# Patient Record
Sex: Female | Born: 1940 | Race: White | Hispanic: No | Marital: Married | Smoking: Never smoker
Health system: Southern US, Community
[De-identification: ages and names within clinical notes are randomized; demographics above are authoritative.]

## PROBLEM LIST (undated history)

## (undated) DIAGNOSIS — G43909 Migraine, unspecified, not intractable, without status migrainosus: Secondary | ICD-10-CM

## (undated) DIAGNOSIS — D649 Anemia, unspecified: Secondary | ICD-10-CM

## (undated) DIAGNOSIS — F32A Depression, unspecified: Secondary | ICD-10-CM

## (undated) DIAGNOSIS — M1990 Unspecified osteoarthritis, unspecified site: Secondary | ICD-10-CM

## (undated) DIAGNOSIS — G8929 Other chronic pain: Secondary | ICD-10-CM

## (undated) DIAGNOSIS — I779 Disorder of arteries and arterioles, unspecified: Secondary | ICD-10-CM

## (undated) DIAGNOSIS — K639 Disease of intestine, unspecified: Secondary | ICD-10-CM

## (undated) DIAGNOSIS — Z973 Presence of spectacles and contact lenses: Secondary | ICD-10-CM

## (undated) DIAGNOSIS — F1320 Sedative, hypnotic or anxiolytic dependence, uncomplicated: Secondary | ICD-10-CM

## (undated) DIAGNOSIS — I5030 Unspecified diastolic (congestive) heart failure: Secondary | ICD-10-CM

## (undated) DIAGNOSIS — M810 Age-related osteoporosis without current pathological fracture: Secondary | ICD-10-CM

## (undated) DIAGNOSIS — F329 Major depressive disorder, single episode, unspecified: Secondary | ICD-10-CM

## (undated) DIAGNOSIS — R42 Dizziness and giddiness: Secondary | ICD-10-CM

## (undated) DIAGNOSIS — Z974 Presence of external hearing-aid: Secondary | ICD-10-CM

## (undated) DIAGNOSIS — H00019 Hordeolum externum unspecified eye, unspecified eyelid: Secondary | ICD-10-CM

## (undated) DIAGNOSIS — H3530 Unspecified macular degeneration: Secondary | ICD-10-CM

## (undated) DIAGNOSIS — K219 Gastro-esophageal reflux disease without esophagitis: Secondary | ICD-10-CM

## (undated) DIAGNOSIS — I739 Peripheral vascular disease, unspecified: Secondary | ICD-10-CM

## (undated) DIAGNOSIS — F419 Anxiety disorder, unspecified: Secondary | ICD-10-CM

## (undated) DIAGNOSIS — K589 Irritable bowel syndrome without diarrhea: Secondary | ICD-10-CM

## (undated) DIAGNOSIS — K122 Cellulitis and abscess of mouth: Secondary | ICD-10-CM

## (undated) DIAGNOSIS — N183 Chronic kidney disease, stage 3 (moderate): Secondary | ICD-10-CM

## (undated) HISTORY — DX: Dizziness and giddiness: R42

## (undated) HISTORY — PX: OVARIAN CYST SURGERY: SHX726

## (undated) HISTORY — DX: Disorder of arteries and arterioles, unspecified: I77.9

## (undated) HISTORY — DX: Unspecified diastolic (congestive) heart failure: I50.30

## (undated) HISTORY — PX: OTHER SURGICAL HISTORY: SHX169

## (undated) HISTORY — DX: Age-related osteoporosis without current pathological fracture: M81.0

## (undated) HISTORY — PX: ABDOMINAL ADHESION SURGERY: SHX90

## (undated) HISTORY — PX: ESOPHAGOGASTRODUODENOSCOPY: SHX5428

## (undated) HISTORY — PX: ABDOMINAL HYSTERECTOMY: SHX81

## (undated) HISTORY — PX: APPENDECTOMY: SHX54

## (undated) HISTORY — DX: Peripheral vascular disease, unspecified: I73.9

## (undated) HISTORY — DX: Unspecified macular degeneration: H35.30

## (undated) HISTORY — PX: SMALL INTESTINE SURGERY: SHX150

## (undated) HISTORY — DX: Other chronic pain: G89.29

## (undated) HISTORY — PX: COLON SURGERY: SHX602

## (undated) HISTORY — PX: FLEXIBLE SIGMOIDOSCOPY: SHX1649

## (undated) HISTORY — DX: Chronic kidney disease, stage 3 (moderate): N18.3

## (undated) HISTORY — PX: SUBTOTAL COLECTOMY: SHX855

## (undated) HISTORY — PX: CHOLECYSTECTOMY: SHX55

---

## 1970-11-29 HISTORY — PX: ABDOMINAL HYSTERECTOMY: SHX81

## 1987-11-30 HISTORY — PX: OTHER SURGICAL HISTORY: SHX169

## 1994-11-29 HISTORY — PX: CHOLECYSTECTOMY: SHX55

## 1998-05-19 ENCOUNTER — Inpatient Hospital Stay (HOSPITAL_COMMUNITY): Admission: AD | Admit: 1998-05-19 | Discharge: 1998-05-22 | Payer: Self-pay | Admitting: Psychiatry

## 1999-04-07 ENCOUNTER — Encounter: Payer: Self-pay | Admitting: Internal Medicine

## 1999-04-07 ENCOUNTER — Ambulatory Visit (HOSPITAL_COMMUNITY): Admission: RE | Admit: 1999-04-07 | Discharge: 1999-04-07 | Payer: Self-pay | Admitting: Internal Medicine

## 1999-12-10 ENCOUNTER — Encounter: Payer: Self-pay | Admitting: Gastroenterology

## 1999-12-10 ENCOUNTER — Ambulatory Visit (HOSPITAL_COMMUNITY): Admission: RE | Admit: 1999-12-10 | Discharge: 1999-12-10 | Payer: Self-pay | Admitting: Gastroenterology

## 2001-08-18 ENCOUNTER — Encounter: Payer: Self-pay | Admitting: Internal Medicine

## 2001-08-18 ENCOUNTER — Encounter: Admission: RE | Admit: 2001-08-18 | Discharge: 2001-08-18 | Payer: Self-pay | Admitting: Internal Medicine

## 2001-08-31 ENCOUNTER — Ambulatory Visit (HOSPITAL_BASED_OUTPATIENT_CLINIC_OR_DEPARTMENT_OTHER): Admission: RE | Admit: 2001-08-31 | Discharge: 2001-08-31 | Payer: Self-pay | Admitting: Otolaryngology

## 2003-07-31 ENCOUNTER — Encounter: Admission: RE | Admit: 2003-07-31 | Discharge: 2003-07-31 | Payer: Self-pay | Admitting: Family Medicine

## 2003-07-31 ENCOUNTER — Encounter: Payer: Self-pay | Admitting: Family Medicine

## 2003-11-27 ENCOUNTER — Encounter: Admission: RE | Admit: 2003-11-27 | Discharge: 2003-11-27 | Payer: Self-pay | Admitting: Family Medicine

## 2004-03-15 ENCOUNTER — Emergency Department (HOSPITAL_COMMUNITY): Admission: EM | Admit: 2004-03-15 | Discharge: 2004-03-15 | Payer: Self-pay | Admitting: Emergency Medicine

## 2004-05-19 ENCOUNTER — Encounter: Admission: RE | Admit: 2004-05-19 | Discharge: 2004-05-19 | Payer: Self-pay | Admitting: Family Medicine

## 2004-06-09 ENCOUNTER — Ambulatory Visit (HOSPITAL_COMMUNITY): Admission: RE | Admit: 2004-06-09 | Discharge: 2004-06-09 | Payer: Self-pay | Admitting: Ophthalmology

## 2004-08-06 ENCOUNTER — Encounter: Admission: RE | Admit: 2004-08-06 | Discharge: 2004-08-06 | Payer: Self-pay | Admitting: Family Medicine

## 2004-08-18 ENCOUNTER — Ambulatory Visit (HOSPITAL_COMMUNITY): Admission: RE | Admit: 2004-08-18 | Discharge: 2004-08-18 | Payer: Self-pay | Admitting: Ophthalmology

## 2004-10-19 ENCOUNTER — Encounter: Admission: RE | Admit: 2004-10-19 | Discharge: 2005-01-17 | Payer: Self-pay | Admitting: Family Medicine

## 2004-11-19 ENCOUNTER — Emergency Department (HOSPITAL_COMMUNITY): Admission: EM | Admit: 2004-11-19 | Discharge: 2004-11-20 | Payer: Self-pay | Admitting: Emergency Medicine

## 2005-02-16 ENCOUNTER — Ambulatory Visit: Payer: Self-pay | Admitting: Family Medicine

## 2005-09-20 ENCOUNTER — Encounter: Admission: RE | Admit: 2005-09-20 | Discharge: 2005-09-20 | Payer: Self-pay | Admitting: Family Medicine

## 2007-10-17 ENCOUNTER — Encounter: Admission: RE | Admit: 2007-10-17 | Discharge: 2007-10-17 | Payer: Self-pay | Admitting: Family Medicine

## 2008-10-16 ENCOUNTER — Other Ambulatory Visit: Admission: RE | Admit: 2008-10-16 | Discharge: 2008-10-16 | Payer: Self-pay | Admitting: Family Medicine

## 2008-11-01 ENCOUNTER — Encounter: Admission: RE | Admit: 2008-11-01 | Discharge: 2008-11-01 | Payer: Self-pay | Admitting: Family Medicine

## 2008-11-04 ENCOUNTER — Encounter: Admission: RE | Admit: 2008-11-04 | Discharge: 2008-11-04 | Payer: Self-pay | Admitting: Family Medicine

## 2010-09-10 LAB — HM SIGMOIDOSCOPY

## 2010-12-20 ENCOUNTER — Encounter: Payer: Self-pay | Admitting: *Deleted

## 2010-12-21 ENCOUNTER — Encounter: Payer: Self-pay | Admitting: Obstetrics and Gynecology

## 2010-12-29 ENCOUNTER — Inpatient Hospital Stay (HOSPITAL_COMMUNITY)
Admission: EM | Admit: 2010-12-29 | Discharge: 2010-12-31 | DRG: 143 | Disposition: A | Discharge status: Home / Self Care | Payer: BC Managed Care – HMO | Attending: Internal Medicine | Admitting: Internal Medicine

## 2010-12-29 DIAGNOSIS — R112 Nausea with vomiting, unspecified: Secondary | ICD-10-CM | POA: Diagnosis not present

## 2010-12-29 DIAGNOSIS — F341 Dysthymic disorder: Secondary | ICD-10-CM | POA: Diagnosis present

## 2010-12-29 DIAGNOSIS — R0789 Other chest pain: Principal | ICD-10-CM | POA: Diagnosis present

## 2010-12-29 DIAGNOSIS — G43909 Migraine, unspecified, not intractable, without status migrainosus: Secondary | ICD-10-CM | POA: Diagnosis present

## 2010-12-29 DIAGNOSIS — Z882 Allergy status to sulfonamides status: Secondary | ICD-10-CM

## 2010-12-29 LAB — DIFFERENTIAL
Basophils Absolute: 0 10*3/uL (ref 0.0–0.1)
Basophils Relative: 0 % (ref 0–1)
Eosinophils Absolute: 0.1 10*3/uL (ref 0.0–0.7)
Eosinophils Relative: 2 % (ref 0–5)
Lymphocytes Relative: 35 % (ref 12–46)
Lymphs Abs: 2.4 10*3/uL (ref 0.7–4.0)
Monocytes Absolute: 0.5 10*3/uL (ref 0.1–1.0)
Monocytes Relative: 8 % (ref 3–12)
Neutro Abs: 3.7 10*3/uL (ref 1.7–7.7)
Neutrophils Relative %: 55 % (ref 43–77)

## 2010-12-29 LAB — URINALYSIS, ROUTINE W REFLEX MICROSCOPIC
Bilirubin Urine: NEGATIVE
Hgb urine dipstick: NEGATIVE
Ketones, ur: NEGATIVE mg/dL
Nitrite: NEGATIVE
Protein, ur: NEGATIVE mg/dL
Specific Gravity, Urine: 1.011 (ref 1.005–1.030)
Urine Glucose, Fasting: NEGATIVE mg/dL
Urobilinogen, UA: 0.2 mg/dL (ref 0.0–1.0)
pH: 7.5 (ref 5.0–8.0)

## 2010-12-29 LAB — TROPONIN I: Troponin I: 0.02 ng/mL (ref 0.00–0.06)

## 2010-12-29 LAB — COMPREHENSIVE METABOLIC PANEL
ALT: 22 U/L (ref 0–35)
AST: 24 U/L (ref 0–37)
Albumin: 3.6 g/dL (ref 3.5–5.2)
Alkaline Phosphatase: 66 U/L (ref 39–117)
BUN: 10 mg/dL (ref 6–23)
CO2: 24 mEq/L (ref 19–32)
Calcium: 9.3 mg/dL (ref 8.4–10.5)
Chloride: 110 mEq/L (ref 96–112)
Creatinine, Ser: 1.14 mg/dL (ref 0.4–1.2)
GFR calc Af Amer: 58 mL/min — ABNORMAL LOW (ref >60)
GFR calc non Af Amer: 48 mL/min — ABNORMAL LOW (ref >60)
Glucose, Bld: 95 mg/dL (ref 70–99)
Potassium: 3.8 mEq/L (ref 3.5–5.1)
Sodium: 142 mEq/L (ref 135–145)
Total Bilirubin: 0.3 mg/dL (ref 0.3–1.2)
Total Protein: 6.5 g/dL (ref 6.0–8.3)

## 2010-12-29 LAB — CBC
HCT: 39.4 % (ref 36.0–46.0)
Hemoglobin: 13.5 g/dL (ref 12.0–15.0)
MCH: 31.8 pg (ref 26.0–34.0)
MCHC: 34.3 g/dL (ref 30.0–36.0)
MCV: 92.9 fL (ref 78.0–100.0)
Platelets: 226 10*3/uL (ref 150–400)
RBC: 4.24 MIL/uL (ref 3.87–5.11)
RDW: 12.5 % (ref 11.5–15.5)
WBC: 6.8 10*3/uL (ref 4.0–10.5)

## 2010-12-29 LAB — MRSA PCR SCREENING: MRSA by PCR: NEGATIVE

## 2010-12-29 LAB — APTT: aPTT: 27 seconds (ref 24–37)

## 2010-12-29 LAB — URINE MICROSCOPIC-ADD ON

## 2010-12-29 LAB — POCT CARDIAC MARKERS
CKMB, poc: <1 ng/mL — ABNORMAL LOW (ref 1.0–8.0)
CKMB, poc: <1 ng/mL — ABNORMAL LOW (ref 1.0–8.0)
Myoglobin, poc: 64.6 ng/mL (ref 12–200)
Myoglobin, poc: 67 ng/mL (ref 12–200)
Troponin i, poc: <0.05 ng/mL (ref 0.00–0.09)
Troponin i, poc: <0.05 ng/mL (ref 0.00–0.09)

## 2010-12-29 LAB — CK TOTAL AND CKMB (NOT AT ARMC)
CK, MB: 1.2 ng/mL (ref 0.3–4.0)
Relative Index: INVALID (ref 0.0–2.5)
Total CK: 85 U/L (ref 7–177)

## 2010-12-29 LAB — BRAIN NATRIURETIC PEPTIDE: Pro B Natriuretic peptide (BNP): <30 pg/mL (ref 0.0–100.0)

## 2010-12-29 LAB — D-DIMER, QUANTITATIVE (NOT AT ARMC): D-Dimer, Quant: 0.22 ug/mL-FEU (ref 0.00–0.48)

## 2010-12-29 LAB — PROTIME-INR
INR: 0.92 (ref 0.00–1.49)
Prothrombin Time: 12.6 seconds (ref 11.6–15.2)

## 2010-12-29 LAB — TSH: TSH: 3.13 u[IU]/mL (ref 0.350–4.500)

## 2010-12-30 ENCOUNTER — Inpatient Hospital Stay (HOSPITAL_COMMUNITY): Payer: BC Managed Care – HMO

## 2010-12-30 LAB — COMPREHENSIVE METABOLIC PANEL
ALT: 28 U/L (ref 0–35)
AST: 37 U/L (ref 0–37)
Albumin: 3.3 g/dL — ABNORMAL LOW (ref 3.5–5.2)
Alkaline Phosphatase: 56 U/L (ref 39–117)
BUN: 5 mg/dL — ABNORMAL LOW (ref 6–23)
CO2: 24 mEq/L (ref 19–32)
Calcium: 8.9 mg/dL (ref 8.4–10.5)
Chloride: 108 mEq/L (ref 96–112)
Creatinine, Ser: 1.12 mg/dL (ref 0.4–1.2)
GFR calc Af Amer: 59 mL/min — ABNORMAL LOW (ref >60)
GFR calc non Af Amer: 49 mL/min — ABNORMAL LOW (ref >60)
Glucose, Bld: 100 mg/dL — ABNORMAL HIGH (ref 70–99)
Potassium: 3.8 mEq/L (ref 3.5–5.1)
Sodium: 141 mEq/L (ref 135–145)
Total Bilirubin: 0.6 mg/dL (ref 0.3–1.2)
Total Protein: 6 g/dL (ref 6.0–8.3)

## 2010-12-30 LAB — CBC
HCT: 37.6 % (ref 36.0–46.0)
Hemoglobin: 12.4 g/dL (ref 12.0–15.0)
MCH: 31.2 pg (ref 26.0–34.0)
MCHC: 33 g/dL (ref 30.0–36.0)
MCV: 94.5 fL (ref 78.0–100.0)
Platelets: 206 10*3/uL (ref 150–400)
RBC: 3.98 MIL/uL (ref 3.87–5.11)
RDW: 13 % (ref 11.5–15.5)
WBC: 6.8 10*3/uL (ref 4.0–10.5)

## 2010-12-30 LAB — CARDIAC PANEL(CRET KIN+CKTOT+MB+TROPI)
CK, MB: 1.2 ng/mL (ref 0.3–4.0)
CK, MB: 1.6 ng/mL (ref 0.3–4.0)
Relative Index: INVALID (ref 0.0–2.5)
Relative Index: INVALID (ref 0.0–2.5)
Total CK: 70 U/L (ref 7–177)
Total CK: 70 U/L (ref 7–177)
Troponin I: 0.01 ng/mL (ref 0.00–0.06)
Troponin I: 0.01 ng/mL (ref 0.00–0.06)

## 2010-12-30 LAB — HEPARIN LEVEL (UNFRACTIONATED): Heparin Unfractionated: 0.19 IU/mL — ABNORMAL LOW (ref 0.30–0.70)

## 2010-12-30 LAB — LIPID PANEL
Cholesterol: 184 mg/dL (ref 0–200)
HDL: 59 mg/dL (ref >39)
LDL Cholesterol: 96 mg/dL (ref 0–99)
Total CHOL/HDL Ratio: 3.1 RATIO
Triglycerides: 145 mg/dL (ref <150)
VLDL: 29 mg/dL (ref 0–40)

## 2010-12-30 LAB — LIPASE, BLOOD: Lipase: 30 U/L (ref 11–59)

## 2010-12-30 MED ORDER — TECHNETIUM TC 99M TETROFOSMIN IV KIT: 30.0000 | PACK | Freq: Once | INTRAVENOUS | Status: AC | PRN

## 2010-12-30 MED ORDER — TECHNETIUM TC 99M TETROFOSMIN IV KIT: 10.0000 | PACK | Freq: Once | INTRAVENOUS | Status: AC | PRN

## 2010-12-31 ENCOUNTER — Other Ambulatory Visit (HOSPITAL_COMMUNITY): Payer: Self-pay

## 2010-12-31 ENCOUNTER — Other Ambulatory Visit (HOSPITAL_COMMUNITY): Payer: Medicare Other

## 2010-12-31 LAB — CBC
HCT: 36.8 % (ref 36.0–46.0)
Hemoglobin: 12.4 g/dL (ref 12.0–15.0)
MCH: 31.2 pg (ref 26.0–34.0)
MCHC: 33.7 g/dL (ref 30.0–36.0)
MCV: 92.7 fL (ref 78.0–100.0)
Platelets: 210 10*3/uL (ref 150–400)
RBC: 3.97 MIL/uL (ref 3.87–5.11)
RDW: 12.8 % (ref 11.5–15.5)
WBC: 7.1 10*3/uL (ref 4.0–10.5)

## 2010-12-31 LAB — URINE CULTURE
Colony Count: 60000
Culture  Setup Time: 201201311650

## 2010-12-31 LAB — BASIC METABOLIC PANEL
BUN: 8 mg/dL (ref 6–23)
CO2: 20 mEq/L (ref 19–32)
Calcium: 8.8 mg/dL (ref 8.4–10.5)
Chloride: 110 mEq/L (ref 96–112)
Creatinine, Ser: 1.03 mg/dL (ref 0.4–1.2)
GFR calc Af Amer: >60 mL/min (ref >60)
GFR calc non Af Amer: 53 mL/min — ABNORMAL LOW (ref >60)
Glucose, Bld: 82 mg/dL (ref 70–99)
Potassium: 3.2 mEq/L — ABNORMAL LOW (ref 3.5–5.1)
Sodium: 141 mEq/L (ref 135–145)

## 2011-01-02 NOTE — Consult Note (Signed)
NAME:  Danielle Gaines, DANNER NO.:  [id number]  MEDICAL RECORD NO.:  [id number]           PATIENT TYPE:  I  LOCATION:  2919                         FACILITY:  [facility]  PHYSICIAN:  Armanda Magic, M.D.     DATE OF BIRTH:  11-Aug-1941  DATE OF CONSULTATION:  12/29/2010 DATE OF DISCHARGE:                                CONSULTATION   REFERRING PHYSICIANS:  Carleene Cooper, MD and [facility].  PRIMARY CARE PHYSICIAN:  Dr. Camillo Flaming.  CHIEF COMPLAINT:  Chest pain.  HISTORY OF PRESENT ILLNESS:  This is a 70 year old female with a history of depression, anxiety, and migraine headaches, who presented to the emergency room with complaints of chest pain.  She states that the chest pain has been off and on for 3 months with increasing frequency and severity over the past several weeks.  Over the past week, she has had increasing shortness of breath which has been constant for several days. About 2-3 days ago, she started having constant chest pain which she described as an elephant sitting on her chest and then she has a second chest pain on her left side, constant and achy, neither of these pains have ever resolved over the past 72 hours.  She also noticed that her left arm has been numb and tingling off and on for the past few weeks, last 20 minutes at a time.  She becomes nauseated, diaphoretic with the pain.  Currently, she still complains of some mild chest pain.  PAST MEDICAL HISTORY:  Includes depression, anxiety, and migraine headaches.  ALLERGIES:  To SULFA.  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  SOCIAL HISTORY:  She is married with 1 son, 47, alive and well.  She denies any tobacco or alcohol use.  FAMILY HISTORY:  Mother had CHF and kidney  disease and father, she does not know.  She has 1 sister, alive and well.  MEDICATIONS: 1. Premarin 0.9 mg daily. 2. Xanax 1 mg 2 tablets at bedtime. 3. Wellbutrin 150 mg at bedtime. 4. Lexapro 20 mg daily. 5. Topamax 100 mg q.p.m. 6. Imitrex p.r.n.  REVIEW OF SYSTEMS:  Otherwise as stated in the HPI is negative.  PHYSICAL EXAMINATION:  VITAL SIGNS:  Blood pressure is 114/74, heart rate 89. GENERAL:  She is a well-developed, well-nourished white female, in no acute distress. HEENT:  Benign. NECK:  Supple without lymphadenopathy.  Carotid upstrokes are +2 bilaterally.  No bruits. LUNGS:  Clear to auscultation throughout. HEART:  Regular rate and rhythm.  No murmurs, rubs, or gallops.  Normal S1 and S2. ABDOMEN:  Soft, nontender, and nondistended.  Normoactive bowel sounds. No hepatosplenomegaly. EXTREMITIES:  No cyanosis, erythema, or edema.  +1 dorsalis pedis pulses bilaterally.  LABS:  Sodium 142, potassium 3.8, chloride 110, bicarb 24, BUN 10, creatinine 1.14.  BNP less than 30.  White cell count 6.8, hemoglobin 13.5, hematocrit 39.4, platelet count 226.  CPK-MB less than 1 x2. Troponin less than 0.05 x2.  CPK 85, MB 1.2, troponin 0.02 with a regular cardiac enzymes.  LFTs are normal.  D-dimer 0.22.  Chest x-ray shows no active disease.  EKG shows sinus rhythm with no ST changes.  ASSESSMENT: 1. Chest pain with negative cardiac enzymes x1 and negative point-of-     care markers x3.  EKG is nonischemic.  Her symptoms are worrisome     but she has had constant chest pain and shortness of breath now for     several days with no change in EKG or enzymes.  This could possibly     represent anxiety or gastroesophageal reflux disease.  D-dimer is     normal.  Her only cardiac risk factor is age. 2. Anxiety and depression. 3. Migraine headaches. 4. Shortness of breath with normal BNP and chest x-ray.  PLAN:  Cycle cardiac enzymes, IV heparin drip and nitro drip,  aspirin daily.  We will check a 2D echocardiogram to evaluate LV function.  If normal, we will plan stress Cardiolite study in the morning.     Armanda Magic, M.D.     TT/MEDQ  D:  12/29/2010  T:  12/30/2010  Job:  [id number]  cc:   Dr. Camillo Flaming  Electronically Signed by Armanda Magic M.D. on 01/02/2011 10:52:01 AM

## 2011-01-04 NOTE — Discharge Summary (Addendum)
NAME:  Danielle Gaines, SOHM NO.:  [id number]  MEDICAL RECORD NO.:  [id number]           PATIENT TYPE:  I  LOCATION:  2919                         FACILITY:  [facility]  PHYSICIAN:  Lonia Blood, M.D.       DATE OF BIRTH:  October 19, 1941  DATE OF ADMISSION:  12/29/2010 DATE OF DISCHARGE:  12/31/2010                              DISCHARGE SUMMARY   PRIMARY CARE PROVIDER:  Dr. Juluis Rainier.  DISCHARGE DIAGNOSES: 1. Atypical chest pain. 2. Nausea and vomiting. 3. History of anxiety. 4. History of migraines.  MEDICATIONS ON DISCHARGE: 1. Prilosec 20 mg p.o. b.i.d. 2. Alprazolam 1 mg p.o. t.i.d. 3. Imitrex 100 mg p.o. daily as needed for headache. 4. Lexapro 20 mg p.o. daily. 5. Multivitamin 1 tablet p.o. daily. 6. Omega-3 p.o. 1 tablet daily. 7. Premarin 0.9 mg 1 tablet p.o. daily. 8. Vitamin C over the counter 1 tablet p.o. daily. 9. Vitamin D over the counter 1 tablet p.o. daily. 10.Wellbutrin 100 mg p.o. daily.  CONSULTS:  [facility], Dr. Armanda Magic.  PROCEDURES: 1. A 2-D echo on December 30, 2010, yields an ejection fraction of 60%     to 65%.  No regional wall motion abnormality was identified.     Systolic function normal. 2. Abdominal x-ray on December 30, 2010, yields sequelae of subtotal     colectomy with distal anastomosis.  Overall bone gas pattern     nonobstructed. 3. Nuclear medicine myocardial imaging on December 30, 2010, yields     normal examination without evidence of exercise-induced myocardial     ischemia. 4. CT angio of the chest on December 29, 2010, no dissection or acute     thoracic findings identified to explain the patient's chest pain. 5. Chest x-ray on December 29, 2010, no active cardiopulmonary     abnormalities.  PERTINENT LABS ON ADMISSION:  D-dimer 0.22, CK-MB less than 1.0. Troponin-I less than 0.05, myoglobin 64.6, PTT 27, PT 12.6, INR 0.92. Sodium 142, potassium 3.8, BUN 10, creatinine 1.14.  BNP less than 30.0. Second  set of cardiac markers; CK-MB less than 1.0.  Troponin-I less than 0.05.  Myoglobin 67.0.  TSH 3.13.  MRSA/PCR screening negative. Lipase 30.  LABS ON DAY OF DISCHARGE:  Sodium of 141, potassium of 3.2, chloride 110, CO2 of 20, BUN 8, creatinine 1.03.  CBC 7.1, hemoglobin 12.4, hematocrit 36.8, platelets 210.  BRIEF SUMMARY:  Ms. Danielle Gaines is a very pleasant 70 year old female with a medical history significant for anxiety, depression who presents to the [facility] ED with a chief complaint of chest pain.  She describes the pain as a pressure, intermittent, rates it as scale 6-10 in intensity.  She indicates this has been going on for more than a week.  Associated symptoms included nausea without vomiting, diaphoresis, and radiation of the pain to her left arm.  She was given nitro and pain medicine in the emergency room with very little relief.  Hospitalist Team was called to admit for further evaluation and treatment.  She was admitted to the step-down unit to rule out.  HOSPITAL COURSE PER PROBLEM: 1. Chest  pain.  The patient admitted to step-down, started on heparin     drip.  Cardiac enzymes were cycled all of which were negative.  A 2-     D echo done on December 30, 2010, yielding ejection fraction of 60%     to 65%.  CT angio was done on December 29, 2010, that was negative     for PE or aortic dissection.  On December 30, 2010, she was     transferred to a medical floor.  Heparin was changed to subcu and     Protonix was started.  She also had a Nuclear Med Myo stress test     on December 30, 2010, which showed no ischemia.  [facility]     consulted and evaluated results.  December 31, 2010, there was no     further symptoms and no evidence of CAD, PE, or dissection.  No     further cardiac evaluation required and Cardiology signed off. 2. Nausea/vomiting.  The patient had an episode on December 30, 2010,     before stress test was given Zofran with good results, suffered no      further nausea or vomiting, problem now resolved.  Tolerating a     regular diet. 3. Anxiety/depression.  At baseline during hospitalization. 4. Migraine.  No occurrence of migraine while in the hospital.  PHYSICAL EXAMINATION:  Documented on rounding note dated December 31, 2010.  FOLLOWUP:  The patient to see Dr. Juluis Rainier, her primary care provider within 7-10 days for followup.  CONDITION ON DISCHARGE:  Stable.  TIME SPENT ON DISCHARGE:  40 minutes.     Danielle Bender, NP   ______________________________ Lonia Blood, M.D.    KMB/MEDQ  D:  12/31/2010  T:  01/01/2011  Job:  [id number]  cc:   Juluis Rainier, MD  Electronically Signed by Lonia Blood M.D. on 01/04/2011 07:47:37 AM Electronically Signed by Toya Smothers  on 01/24/2011 08:27:49 AM

## 2011-01-08 NOTE — H&P (Signed)
NAME:  Danielle Gaines, Danielle Gaines NO.:  [id number]  MEDICAL RECORD NO.:  [id number]          PATIENT TYPE:  EMS  LOCATION:  MAJO                         FACILITY:  [facility]  PHYSICIAN:  Ruthy Dick, MD    DATE OF BIRTH:  Dec 14, 1940  DATE OF ADMISSION:  12/29/2010 DATE OF DISCHARGE:                             HISTORY & PHYSICAL   The patient seen and examined in the emergency room.  PRIMARY CARE PHYSICIAN:  Dr. Juluis Rainier.  CHIEF COMPLAINT:  Chest pain that has been on and off for the past 1 week plus.  HISTORY OF PRESENT ILLNESS:  Danielle Gaines is a 70 year old lady with a past medical history significant for anxiety, depression, and migraine headaches, who presented to the hospital with a chest pain, which she described as pressure with intensity of about 6/10.  The pain has been going on for the past 1 week plus.  The patient is in the room with her husband and also with her mother, who also helped with the history.  She described the pain as being pressure-like as if an elephant is sitting on her chest with radiation to her left arm.  Also the patient admits to having nausea during these periods, but no vomiting.  Also admits to having some diaphoresis, which sometimes even comes on when she does not have the chest pain.  She is unable to tell me exactly duration of the chest pain.  She says it just lasts for so long.  She was given nitroglycerin, some pain medication, which only relieved her pain for some time.  She is not normally on aspirin at home, but was given aspirin on the way to the hospital.  She did go to her primary care physician, Dr. Juluis Rainier, who called the EMS after seeing her to come to the emergency room.  Here in the emergency room, the patient has done relatively well, but still in pain.  She rates her pain scale as +4/10 at this time.  A D-dimer was obtained, which came back to be normal making pulmonary embolism unlikely.   However, would now rule out the possibility of dissecting aortic aneurysm.  As far as her family history is concerned, the mother has congestive heart failure and hypertension.  PAST MEDICAL HISTORY: 1. Anxiety. 2. Depression. 3. Migraine headaches.  MEDICATIONS:  Calcium, Imitrex, Lexapro, Premarin, Topamax, Wellbutrin, and Xanax.  ALLERGIES:  SULFA, WHICH CAUSES ANAPHYLACTIC REACTIONS.  FAMILY HISTORY:  Significant for mother with congestive heart failure, which actually started in her 87s and also hypertension.  The patient does not know her father.  REVIEW OF SYSTEMS:  All systems reviewed, but negative except noted in history of present illness.  SOCIAL HISTORY:  The patient lives with husband.  Denies ever smoking cigarettes.  Denies alcohol abuse and also denies illicit drug use.  PHYSICAL EXAMINATION:  GENERAL:  The patient seen and examined in the emergency room.  She is alert, oriented x3, in no acute cardiopulmonary distress, in no painful distress either. VITAL SIGNS:  Temperature is 98.1, pulse 96, respiration 20, blood pressure 116/64, and saturating 99% on room  air. HEENT:  Normocephalic and atraumatic.  Pupils equal, round, and react to light.  Extraocular muscles intact.  Nares patent. NECK: Supple.  No JVD.  No lymphadenopathy or thyromegaly. CHEST:  Clear to auscultation bilaterally.  No rhonchi.  No rales.  No wheezing. ABDOMEN:  Soft and nontender.  No hepatosplenomegaly. EXTREMITIES:  No clubbing.  No cyanosis.  No edema. CARDIOVASCULAR:  Some first and second heart sounds only. CENTRAL NERVOUS SYSTEM:  Nonfocal.  Cranial nerves intact, II through XII.  The patient has normal reflexes and power is 5/5 globally.  LABS AND INVESTIGATIONS:  EKG shows normal sinus rhythm.  No acute ST-T wave abnormalities noted.  CBC with differential is perfectly normal. Comprehensive metabolic profile is also normal.  CK-MB is less than 1.0. Troponin is less than 0.05.   BN-peptide is less than 30.  INR is 0.92. D-dimer is 0.22.  Chest x-ray shows no active cardiopulmonary abnormalities.  ASSESSMENT: 1. Chest pain with some typical and some atypical components,     questionable for unstable angina. 2. Anxiety disorder. 3. Depression. 4. Migraine headache.  PLAN OF CARE:  The patient will be admitted to the Stepdown bed since she continues to have chest pain and pressure.  We will rule out acute coronary syndrome with serial enzymes and EKG.  Pain management will be provided.  Nitro paste and aspirin will also be provided and medications for the patient's anxiety should be provided since this may also be another anxiety attack.  DVT prophylaxis is instituted.  We have consulted [facility] and we believe this patient is going to need risk stratification considering some of symptoms, which sounds very typical for acute coronary syndrome.  We would order a CT scan of the chest with contrast to rule out dissecting aortic aneurysm since pain medication has not resolved.  Thank you so much for this dictation.  Plan of care has been discussed with the patient and with the patient's husband.  They are all in agreement and plan to comply.     Ruthy Dick, MD     GU/MEDQ  D:  12/29/2010  T:  12/29/2010  Job:  [id number]  Electronically Signed by Virginia Rochester M.D. on 01/08/2011 11:29:38 AM

## 2011-04-16 NOTE — Op Note (Signed)
NAME:  Danielle Gaines, Danielle Gaines NO.:  [id number]   MEDICAL RECORD NO.:  [id number]          PATIENT TYPE:  OIB   LOCATION:  2875                         FACILITY:  [facility]   PHYSICIAN:  Guadelupe Sabin, M.D.DATE OF BIRTH:  [birth year]   DATE OF PROCEDURE:  08/18/2004  DATE OF DISCHARGE:  08/18/2004                                 OPERATIVE REPORT   PREOPERATIVE DIAGNOSIS:  Posterior subcapsular cataract, left eye.   POSTOPERATIVE DIAGNOSIS:  Posterior subcapsular cataract, left eye.   OPERATION PERFORMED:  Planned extracapsular cataract extraction,  phacoemulsification, primary insertion of posterior chamber intraocular lens  implant.   SURGEON:  Guadelupe Sabin, M.D.   ASSISTANT:  Nurse.   ANESTHESIA:  Local 4% Xylocaine, 0.75% Marcaine retrobulbar block with  Wydase added.  Topical tetracaine, intraocular Xylocaine.  Anesthesia  standby required.  Patient given sodium pentothal intravenously during the  period of retrobulbar blocking.   DESCRIPTION OF PROCEDURE:  After the patient was prepped and draped, a lid  speculum was inserted in the left eye.  Schiotz tonometry was recorded as 7  to 8 scale units with a 5.5 g weight.  A peritomy was performed adjacent to  the limbus from the 11 to the 1 o'clock position. The corneoscleral junction  was cleaned and a corneoscleral groove made with a 45 degree superblade.  The anterior chamber was then entered with a 2.5 mm diamond keratome at the  12 o'clock position and the 15 degree blade at the 2:30 position.  Using a  bent 26 gauge needle on a OcuCoat syringe, a circular capsulorrhexis was  begun and then completed with the Grabow forceps.  Hydrodissection and  hydrodelineation were performed using 1% Xylocaine.  The 30 degree  phacoemulsification tip was then inserted with slow, controlled  emulsification of the lens nucleus.  Total ultrasonic time was 54 seconds,  average power level 8%, total amount of fluid used 40  mL.  Following removal  of the soft nucleus, the residual cortex and posterior subcapsular plaque  were removed with the irrigation aspiration tip.  The posterior capsule  appeared intact with a brilliant red reflex.  It was therefore elected to  insert an [employer] [id number] silicone three-piece posterior  chamber intraocular lens implant, diopter strength +17.00.  This was  inserted with a McDonald forceps into the anterior chamber and then centered  into the capsular bag using the [facility] lens rotator.  The lens appeared to  be well centered.  The Healon and OcuCoat which had been used intermittently  during the procedure was aspirated and replaced with balanced salt solution  and Miochol ophthalmic solution.  The operative incisions appeared to be  self sealing.  It was, however, elected to place a single 10-0 interrupted  nylon suture across the incision at the 12 o'clock position to ensure  closure and to prevent endophthalmitis.  Pilopine ophthalmic gel and  Maxitrol ointment was instilled in the conjunctival cul-de-sac and a light  patch and protective shield applied to the operated left eye.  Duration of  procedure and anesthesia administration was 45  minutes.  The patient  tolerated the procedure well and left the operating room for the recovery  room in good condition.       HNJ/MEDQ  D:  08/18/2004  T:  08/19/2004  Job:  [id number]

## 2011-04-16 NOTE — H&P (Signed)
NAME:  Danielle Gaines, Danielle Gaines NO.:  [id number]   MEDICAL RECORD NO.:  [id number]                   PATIENT TYPE:  OIB   LOCATION:  2875                                 FACILITY:  [facility]   PHYSICIAN:  Guadelupe Sabin, M.D.             DATE OF BIRTH:  Apr 04, 1941   DATE OF ADMISSION:  08/18/2004  DATE OF DISCHARGE:                                HISTORY & PHYSICAL   This was a planned outpatient surgical readmission of this 70 year old white  female admitted for cataract implant surgery of the left eye.   HISTORY OF PRESENT ILLNESS:  This patient has noted deterioration in vision  in both eyes.  She has undergone previous LASIK corneal surgery in the past.  Subsequently, however, more recently she has developed posterior subcapsular  cataracts in both eyes.  She was previously admitted on June 09, 2004, for  cataract implant surgery of the right eye.  The patient did well following  this surgery and is now admitted for similar surgery for the posterior  subcapsular cataract of her left eye.  Vision is recorded at 20/40 right eye  without correction and 20/200 left eye without correction.  With refraction,  vision was the same right eye,20/80 minus left eye, indicating the decreased  vision due to the cataract formation.  The patient was given oral discussion  and printed information concerning the procedure and its possible  complications.  She signed an informed consent and arrangements were made  for her outpatient admission at this time.   PAST MEDICAL HISTORY:  The patient is in good general health under Dr.  [name].   The patient takes multiple medications, including Topamax, Imitrex,  Estratabs, vitamin C, omega-3, Caltrate, multivitamins, and glucosamine.   PHYSICAL EXAMINATION:  GENERAL:  The patient is a pleasant, well-nourished,  well-developed 70 year old white female in no acute distress.  HEENT:  Eyes:  The eyes are wide and clear with a clear  cornea, deep and  clear anterior chamber.  A posterior chamber implant is present in the right  eye with a clear capsule.  The left eye shows posterior subcapsular cataract  change.  Dilated fundus examination shows a clear vitreous, attached retina,  with normal optic nerve, blood vessels, macula.  CHEST:  Lungs clear to percussion and auscultation.  CARDIAC:  Normal sinus rhythm, no cardiomegaly, no murmurs.  ABDOMEN:  Negative.  EXTREMITIES:  Negative.   ADMISSION DIAGNOSIS:  Posterior subcapsular cataract, left eye.   SURGICAL PLAN:  Cataract implant surgery, left eye.       HNJ/MEDQ  D:  08/18/2004  T:  08/18/2004  Job:  [id number]   cc:   Quita Skye. [name], M.D.  [address]  [place]  [place] [address]  Fax: [phone]

## 2011-04-16 NOTE — Op Note (Signed)
[place]. [facility]  Patient:    MAVERICK, PATMAN Visit Number: [id number] MRN: [id number]          Service Type: DSU Location: [facility] Attending Physician:  Fernande Boyden Dictated by:   Gloris Manchester. Lazarus Salines, M.D. Proc. Date: 08/31/01 Admit Date:  08/31/2001   CC:         Jerl Santos, MD  Modesta Messing, M.D.   Operative Report  PREOPERATIVE DIAGNOSIS:  Displaced left ____________ zygomatic fracture.  POSTOPERATIVE DIAGNOSIS:  Displaced left ____________ zygomatic fracture.  OPERATION PERFORMED:  Open reduction internal fixation, ____________ zygomatic fracture, multiple approaches.  SURGEON:  Gloris Manchester. Lazarus Salines, M.D.  ANESTHESIA:  General orotracheal.  ESTIMATED BLOOD LOSS:  Minimal.  COMPLICATIONS:  None.  FINDINGS:  A medially impacted clockwise rotated displaced left to zygoma with collapse of the zygomatic arch.  A fracture of the orbital floor but no displacement or dehiscence including after reduction of the zygoma.  INDICATIONS FOR PROCEDURE:  With the patient in a comfortable supine position, general orotracheal anesthesia was induced without difficulty.  At an appropriate level, the table was turned 90 degrees and the patient placed in a slightly reversed Trendelenburg position.  The head was rotated towards the right for access to the left face.  A saline moistened throat pack was placed. 1% Xylocaine with 1:100,000 epinephrine, 8 cc total was infiltrated along the infraorbital rim and lateral brow on the left side and into the lower lid both transcutaneous and transconjunctivally.  Approximately 10 minutes were allowed for hemostasis to take effect.  A sterile preparation of the left midface was accomplished.  The area was inspected and palpated with the findings as described above.  A 2 cm incision was made beveled with the hair follicles over the lateral brow and carried down to the periosteum at the site of the  palpable fracture.  The periosteum was incised and no further work was done at this site at this time.  The lateral canthus was incised horizontally approximately 4 mm and dissection carried down bluntly and sharply and finally the inferior limb of the lateral canthal tendon was divided with scissors leaving a small stump at the lateral orbital rim.  This freed the lateral lid.  A 3-0 silk was placed through the tarsal plate for retraction stitch. Immediately beneath the tarsus, a transconjunctival incision was made starting at the medial punctum and going all the way across the lid to the lateral incision.  This was carried into the tissues of the lid bluntly and the preseptal plane was identified.  Two 3-0 silk stitches were placed in the conjunctival flap.  A corneal shield was placed and the flap was raised over the corneal shield to protect the globe.  The dissection was carried down on a broad front to the infraorbital rim.  The periosteum was incised on both sides of the fracture.  A 4 mm free floating segment was noted at the fracture site which was collapsed.  The periosteum was dissected onto the face of the maxilla slightly and into the orbital floor slightly.  Attention was turned to the lateral incision where the periosteum was incised and dissected towards the orbit and back into the temporal fossa.  At this time a Joker elevator was placed in the lateral incision and inserted down behind the buttress of the zygoma and the arch.  With anterior and lateral traction, the zygoma was mobilized.  The arch was elevated and noted to be slightly  mobile but basically stable.  The fracture remained slightly rotated and working through the infraorbital incision, a bone hook was used to slightly elevate and rotate the zygoma.  This gave a good approximation across the infraorbital rim at the arch and at the lateral brow.  Beginning at the lateral brow, a 4-hole plate was placed and  1.5 mm screw holes were drilled and placed, two on each side of the fracture to stabilize the frontal zygomatic suture.  Working at the infraorbital incision, a 6-hole curved plate was placed and observed to be of adequate size.  Two holes were drilled to support the plate on the medial stable segment and then the zygoma was then mobilized, lateralized and additional drill holes were placed to stabilize the segment with a good configuration of the infraorbital rim.  The free floating fragment at this point fit properly and was secured with a single screw.  This corrected the configuration of the fractured zygoma.  Palpation in the mouth revealed intact configuration along the maxillary buttress.  The reduced bone was stable.  All screws were tightened.  Exploration of the orbital floor for a small distance revealed a fracture along the infraorbital nerve but no displacement, no dehiscence and no entrapment.  No further attention was required.  Both wounds were thoroughly irrigated.  The periosteum was reapproximated in both wounds with interrupted 4-0 chromic sutures.  The orbicularis oculi was reapproximated in the brow incision with the same agent. Finally the skin was closed with interrupted 6-0 Ethilon sutures in a cosmetic fashion.  After closing the periosteum, the conjunctiva was closed with a running simple 6-0 plain gut with buried knots.  Several silk retraction sutures in the tarsal plate and the conjunctiva were removed.  The lower limb of the lateral canthal tendon was identified in both ends and was reapproximated using a 4-0 PDS suture.  This gave good configuration to the lower lid.  Additional 4-0 PDS was placed along the tarsus to support it in connection with the upper lid.  The lateral canthal region incision was closed with buried 6-0 plain gut sutures and a single 6-0 Ethilon on the surface.  At this point the corneal  shield was removed.  The conjunctival sac was  thoroughly irrigated.  The globe looked to be intact.  This completed the facial procedure.  The pharynx was suctioned free and throat pack was removed.  The patient was returned to anesthesia, awakened, extubated and transferred to recovery in stable condition.  COMMENT:  The patient is a 70 year old white female now 13 days status post a syncopal episode where she fell, struck her left face sustaining a displaced zygoma fracture with complaints of cosmetic deformity, trismus and pain. Todays repair successfully addressed all of these areas.  Anticipate a routine postoperative recovery with attention to analgesia, antibiosis, ice, elevation and observation for bleeding, infection or visual compromise.  Will remove the sutures in 5-7 days.  Will have her stay with a soft diet and avoid trauma to the face to allow the arch to stabilize and heel properly.  DESCRIPTION OF PROCEDURE: Dictated by:   Gloris Manchester. Lazarus Salines, M.D. Attending Physician:  Fernande Boyden DD:  08/31/01 TD:  08/31/01 Job: [id number]

## 2011-04-16 NOTE — Op Note (Signed)
NAME:  Danielle Gaines, Danielle Gaines NO.:  [id number]   MEDICAL RECORD NO.:  [id number]                   PATIENT TYPE:  OIB   LOCATION:  2899                                 FACILITY:  [facility]   PHYSICIAN:  Guadelupe Sabin, M.D.             DATE OF BIRTH:  Sep 08, 1941   DATE OF PROCEDURE:  06/09/2004  DATE OF DISCHARGE:  06/09/2004                                 OPERATIVE REPORT   PREOPERATIVE DIAGNOSIS:  Posterior subcapsular cataract, right eye, status  post LASIK corneal surgery, right eye.   POSTOPERATIVE DIAGNOSIS:  Posterior subcapsular cataract, right eye, status  post LASIK corneal surgery, right eye.   OPERATION:  Planned extracapsular cataract extraction - phacoemulsification,  primary insertion of posterior chamber intraocular lens implant.   SURGEON:  Guadelupe Sabin, M.D.   ASSISTANT:  Nurse   ANESTHESIA:  Local 4% Xylocaine, 0.75% Marcaine, retrobulbar block with  Wydase added, topical Tetracaine, intraocular Xylocaine, anesthesia standby  required, patient was given sodium Pentothal or Ditropan intravenously  during the period of retrobulbar injection.   OPERATIVE PROCEDURE:  After the patient was prepped and draped, a lid  speculum was inserted in the right eye.  Schiotz tonometry was recorded at 7  scale units with a 5.5 gram weight.  A peritomy was performed adjacent to  the limbus from the 11 to 1 o'clock position.  The corneoscleral junction  was cleaned and a corneoscleral groove made with a 45 degree superblade.  The anterior chamber was then entered with the 2.5 mm diamond keratome at  the 12 o'clock position and the 15 degree blade at the 2:30 position.  Using  a bent 26 gauge needle on an OcuCoat syringe, a circular capsulorrhexis was  begun and then completed with the gray bill forceps.  Hydrodissection and  hydrodelineation were performed using 1% Xylocaine.  A 30 degree  phacoemulsification tip was then inserted with slow control  of  emulsification of the rather soft lens nucleus.  There was some back  cracking with the Bechert pick.  Total ultrasonic time 59 seconds, average  power level 8%, total amount of fluid used 45 mL.  Following removal of the  nucleus, the residual cortex and the posterior subcapsular plaque were  aspirated with the irrigation aspiration tip.  The posterior capsule  appeared intact with a brilliant red fundus reflex.  It was, therefore,  elected to insert an [employer] SI40MD silicone three piece  posterior chamber intraocular lens implant.  Diopter strength +17.00.  This  was inserted with the McDonald forceps into the anterior chamber and then  centered into the capsular bag using the [facility] lens rotated.  The lens  appeared to be well centered.  The Healon and OcuCoat, which had been used  intermittently during the procedure, was aspirated and replaced with  balanced salt solution and Miochol ophthalmic solution.  The operative  incisions were self-sealing.  It was, however, elected to place a single 10-  0 interrupted nylon suture across the 12 o'clock incision to insure closure  and to prevent endophthalmitis.  Duration of the procedure 45 minutes.  The  patient tolerated the procedure well in general and left the operating room  for the recovery room in good condition.                                              Guadelupe Sabin, M.D.   HNJ/MEDQ  D:  06/09/2004  T:  06/09/2004  Job:  [id number]

## 2011-04-16 NOTE — H&P (Signed)
NAME:  Danielle Gaines, FULGHAM NO.:  [id number]   MEDICAL RECORD NO.:  [id number]                   PATIENT TYPE:  OIB   LOCATION:  2899                                 FACILITY:  [facility]   PHYSICIAN:  Guadelupe Sabin, M.D.             DATE OF BIRTH:  07-30-1941   DATE OF ADMISSION:  06/09/2004  DATE OF DISCHARGE:  06/09/2004                                HISTORY & PHYSICAL   This was a planned outpatient surgical admission of this 70 year old white  female admitted for cataract implant surgery of the right eye.   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  The patient was seen by Dr. Jearld Gaines who performed bilateral  LASIK surgery on February 16, 2000.  At that time, it was noted that she had  early posterior subcapsular cataract change.  The patient subsequently was  seen in consultation at the [facility] and also by Dr. Aida Gaines and Dr. Valene Gaines at the [facility].  The patient still had  some blurring of vision and was wearing a contact lens.  I first saw this  patient in my office on Apr 16, 2003, when the patient was complaining of a  red right eye with draining and burning.  Acute conjunctivitis was noted.  The patient was later seen on June 29.  At that time, detailed slit lamp  examination once again confirmed the presence of posterior subcapsular and  nuclear cataract formation in both eyes.  Visual acuity was 20/50- right  eye, 20/40 left eye.  More recently the patient stated that her vision had  become progressively worse.  When seen on June 04, 2004, vision was 20/50-  right eye, 20/60- left eye.  It was felt that the posterior subcapsular  cataracts had progressed and that surgery was indicated.  The patient was  given oral discussion and printed information concerning cataract implant  surgery.  It was especially pointed out to this patient that implant  selection following  LASIK surgery was extremely difficult and that errors  either toward the hyperopic or myopic side could be obtained necessitating  even the use of glasses, contact lenses and/or possible implant exchange.  Understanding this, the patient still elected to proceed with cataract  implant surgery.  She signed an informed consent, and arrangements were made  for her outpatient admission at this time.   PAST MEDICAL HISTORY:  The patient is under the care of Dr. Artis Gaines.  Dr.  Artis Gaines feels the patient is in good general health and a low surgical risk  for the proposed surgery under local anesthesia.   Multiple medications are used including Estratest HS, Xanax, hyoscyamine,  Imitrex, Topamax, Advil or Tylenol, hydrocodone, Centrum Silver, vitamin C,  Caltrate, glucosamine, Omega 3, and Zyrtec.   PHYSICAL EXAMINATION:  VITAL SIGNS:  As recorded on  admission, blood  pressure 108/67, pulse 64, respirations 16, temperature 97.4.  GENERAL:  The patient is a pleasant, well-nourished, well-developed, 62-year-  old white female in no acute distress.  HEENT:  Eyes: Ocular exam as noted above.  Slit lamp examination: Posterior  subcapsular cataract, both eyes.  Dilated fundus examination:  Clear  vitreous, attached retina, normal optic nerve, blood vessels, and macula.  CHEST: Lungs clear to percussion and auscultation.  HEART:  Normal sinus rhythm.  No cardiomegaly, no murmurs.  ABDOMEN:  Negative.  EXTREMITIES:  Negative.   ADMISSION DIAGNOSES:  1. Posterior subcapsular cataract, right eye greater than left.  2. Status post LASIK corneal surgery.   SURGICAL PLAN:  Cataract implant surgery, right eye now, left eye later.                                                Guadelupe Sabin, M.D.    HNJ/MEDQ  D:  06/09/2004  T:  06/09/2004  Job:  [id number]   cc:   Dr. Artis Gaines  Send copies of these notes and any  associated Lab data, EKG, chest x-ray

## 2011-11-24 ENCOUNTER — Ambulatory Visit (INDEPENDENT_AMBULATORY_CARE_PROVIDER_SITE_OTHER): Payer: BC Managed Care – PPO

## 2011-11-24 DIAGNOSIS — K909 Intestinal malabsorption, unspecified: Secondary | ICD-10-CM

## 2011-11-24 DIAGNOSIS — J0110 Acute frontal sinusitis, unspecified: Secondary | ICD-10-CM

## 2011-11-24 DIAGNOSIS — R059 Cough, unspecified: Secondary | ICD-10-CM

## 2011-11-24 DIAGNOSIS — R05 Cough: Secondary | ICD-10-CM

## 2011-11-24 DIAGNOSIS — N390 Urinary tract infection, site not specified: Secondary | ICD-10-CM

## 2011-12-23 ENCOUNTER — Ambulatory Visit: Payer: BC Managed Care – PPO | Admitting: Family Medicine

## 2012-01-24 ENCOUNTER — Other Ambulatory Visit: Payer: Self-pay | Admitting: Family Medicine

## 2012-01-24 DIAGNOSIS — Z1231 Encounter for screening mammogram for malignant neoplasm of breast: Secondary | ICD-10-CM

## 2012-01-25 ENCOUNTER — Other Ambulatory Visit: Payer: Self-pay | Admitting: Family Medicine

## 2012-01-25 DIAGNOSIS — Z780 Asymptomatic menopausal state: Secondary | ICD-10-CM

## 2012-02-01 ENCOUNTER — Ambulatory Visit: Payer: BC Managed Care – HMO

## 2012-02-01 ENCOUNTER — Other Ambulatory Visit: Payer: BC Managed Care – HMO

## 2012-02-08 ENCOUNTER — Ambulatory Visit
Admission: RE | Admit: 2012-02-08 | Discharge: 2012-02-08 | Disposition: A | Discharge status: Home / Self Care | Payer: BC Managed Care – HMO | Source: Ambulatory Visit | Attending: Family Medicine | Admitting: Family Medicine

## 2012-02-08 DIAGNOSIS — Z780 Asymptomatic menopausal state: Secondary | ICD-10-CM

## 2012-02-08 DIAGNOSIS — Z1231 Encounter for screening mammogram for malignant neoplasm of breast: Secondary | ICD-10-CM

## 2012-02-16 ENCOUNTER — Other Ambulatory Visit: Payer: Self-pay | Admitting: Otolaryngology

## 2012-02-16 ENCOUNTER — Ambulatory Visit
Admission: RE | Admit: 2012-02-16 | Discharge: 2012-02-16 | Disposition: A | Discharge status: Home / Self Care | Payer: BC Managed Care – PPO | Source: Ambulatory Visit | Attending: Otolaryngology | Admitting: Otolaryngology

## 2012-02-16 DIAGNOSIS — J329 Chronic sinusitis, unspecified: Secondary | ICD-10-CM

## 2012-09-17 DIAGNOSIS — G43709 Chronic migraine without aura, not intractable, without status migrainosus: Secondary | ICD-10-CM | POA: Insufficient documentation

## 2012-11-10 DIAGNOSIS — H5000 Unspecified esotropia: Secondary | ICD-10-CM | POA: Insufficient documentation

## 2012-11-10 DIAGNOSIS — Z9889 Other specified postprocedural states: Secondary | ICD-10-CM | POA: Insufficient documentation

## 2012-11-10 DIAGNOSIS — Z961 Presence of intraocular lens: Secondary | ICD-10-CM | POA: Insufficient documentation

## 2012-11-10 DIAGNOSIS — H18529 Epithelial (juvenile) corneal dystrophy, unspecified eye: Secondary | ICD-10-CM | POA: Insufficient documentation

## 2013-04-03 ENCOUNTER — Other Ambulatory Visit: Payer: Self-pay | Admitting: Gastroenterology

## 2013-04-03 DIAGNOSIS — R1011 Right upper quadrant pain: Secondary | ICD-10-CM

## 2013-04-05 ENCOUNTER — Ambulatory Visit
Admission: RE | Admit: 2013-04-05 | Discharge: 2013-04-05 | Disposition: A | Discharge status: Home / Self Care | Payer: BC Managed Care – PPO | Source: Ambulatory Visit | Attending: Gastroenterology | Admitting: Gastroenterology

## 2013-04-05 DIAGNOSIS — R1011 Right upper quadrant pain: Secondary | ICD-10-CM

## 2013-09-21 HISTORY — PX: PARS PLANA VITRECTOMY: SHX2166

## 2013-09-21 HISTORY — PX: MEMBRANECTOMY: SHX2029

## 2013-12-07 DIAGNOSIS — H348192 Central retinal vein occlusion, unspecified eye, stable: Secondary | ICD-10-CM | POA: Insufficient documentation

## 2013-12-07 DIAGNOSIS — H35352 Cystoid macular degeneration, left eye: Secondary | ICD-10-CM | POA: Insufficient documentation

## 2014-04-12 ENCOUNTER — Telehealth: Payer: Self-pay | Admitting: Internal Medicine

## 2014-04-12 NOTE — Telephone Encounter (Signed)
I am transitioning to retirement from primary care. I have been engaged by the Nurse Practitioner programs at  [facility] to teach their students in adult medical care through out patient clinical rotations at the [employer] site.  I have treasured your friendship and trust and the  honor and pleasure of working with Stanton Kidney as her primary care physician; but unfortunately I am unable to take any new patients due to these obligations. [employer]

## 2014-04-12 NOTE — Telephone Encounter (Signed)
Dr Randel Pigg @ [facility] or Dr Maudie Mercury @ [facility]

## 2014-04-12 NOTE — Telephone Encounter (Signed)
Can follow up with on home phone or cell phone of Danielle Gaines at [phone]

## 2014-04-12 NOTE — Telephone Encounter (Signed)
Patients husband came in requesting Dr. Linna Darner to take on his wife Danielle Gaines.  Mr. Clavijo states that Dr. Linna Darner was the doctor of his late mother in law Meckenzie Balsley.

## 2014-04-12 NOTE — Telephone Encounter (Signed)
Did call patient to let her know you would not be able to take her on as a patient.  She wanted me to ask again who you recommended.

## 2014-04-12 NOTE — Telephone Encounter (Signed)
Danielle Gaines request that if Dr. Linna Darner can not take on his wife that she be recommended of a physician.

## 2014-04-15 NOTE — Telephone Encounter (Signed)
Patient was pleased with the recommendations.  She Thanked Dr. Linna Darner.

## 2014-05-24 ENCOUNTER — Other Ambulatory Visit: Payer: Self-pay

## 2014-05-24 ENCOUNTER — Encounter: Payer: Self-pay | Admitting: Family Medicine

## 2014-05-24 ENCOUNTER — Ambulatory Visit (INDEPENDENT_AMBULATORY_CARE_PROVIDER_SITE_OTHER): Payer: BC Managed Care – PPO | Admitting: Family Medicine

## 2014-05-24 VITALS — BP 116/78 | HR 72 | Temp 97.8°F | Wt 140.5 lb

## 2014-05-24 DIAGNOSIS — G43009 Migraine without aura, not intractable, without status migrainosus: Secondary | ICD-10-CM

## 2014-05-24 DIAGNOSIS — Z1231 Encounter for screening mammogram for malignant neoplasm of breast: Secondary | ICD-10-CM

## 2014-05-24 DIAGNOSIS — H3530 Unspecified macular degeneration: Secondary | ICD-10-CM

## 2014-05-24 DIAGNOSIS — F341 Dysthymic disorder: Secondary | ICD-10-CM

## 2014-05-24 DIAGNOSIS — F329 Major depressive disorder, single episode, unspecified: Secondary | ICD-10-CM

## 2014-05-24 DIAGNOSIS — G43909 Migraine, unspecified, not intractable, without status migrainosus: Secondary | ICD-10-CM | POA: Insufficient documentation

## 2014-05-24 DIAGNOSIS — R0989 Other specified symptoms and signs involving the circulatory and respiratory systems: Secondary | ICD-10-CM

## 2014-05-24 DIAGNOSIS — M546 Pain in thoracic spine: Secondary | ICD-10-CM

## 2014-05-24 DIAGNOSIS — R0609 Other forms of dyspnea: Secondary | ICD-10-CM

## 2014-05-24 DIAGNOSIS — R0600 Dyspnea, unspecified: Secondary | ICD-10-CM

## 2014-05-24 DIAGNOSIS — F419 Anxiety disorder, unspecified: Secondary | ICD-10-CM

## 2014-05-24 DIAGNOSIS — F32A Depression, unspecified: Secondary | ICD-10-CM

## 2014-05-24 DIAGNOSIS — K219 Gastro-esophageal reflux disease without esophagitis: Secondary | ICD-10-CM

## 2014-05-24 MED ORDER — ESTROGENS CONJUGATED 0.9 MG PO TABS: 0.9000 mg | ORAL_TABLET | Freq: Every day | ORAL | Status: DC

## 2014-05-24 MED ORDER — ALENDRONATE SODIUM 70 MG PO TABS: 70.0000 mg | ORAL_TABLET | ORAL | Status: DC

## 2014-05-24 NOTE — Progress Notes (Signed)
No chief complaint on file.   HPI:  Danielle Gaines is here to establish care. Dr. Linna Darner recommended me to her. She has not had a family doctor in a long time. Last PCP and physical: no physical in a long time.  Has the following chronic problems and concerns today:  Patient Active Problem List   Diagnosis Date Noted  . Migraine - followed by Dr. Sima Matas in Neurology 05/24/2014  . Anxiety and depression - managed at [facility] 05/24/2014  . GERD (gastroesophageal reflux disease) 05/24/2014  . Macular degeneration - goes to [facility] for this 05/24/2014   Migraines: -followed by Dr. 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 Earlean Shawl   Health Maintenance: -needs medicare physical  ROS: See pertinent positives and negatives per HPI.  History reviewed. No pertinent past medical history.  Family History  Problem Relation Age of Onset  . Kidney disease Mother   . Heart disease Mother     History   Social History  . Marital Status: Married    Spouse Name: N/A    Number of Children: N/A  . Years of Education: N/A   Social History Main Topics  . Smoking status: Never Smoker   . Smokeless tobacco: None  . Alcohol Use: No  . Drug Use: No  . Sexual Activity: None   Other Topics Concern  . None   Social History Narrative   Work or School: none       Home Situation: lives with husband      Spiritual Beliefs:       Lifestyle: no regular exercise; diet is ok             Current outpatient prescriptions:alendronate (FOSAMAX) 70 MG tablet, , Disp: , Rfl: ;  ALPRAZolam (XANAX) 1 MG tablet, 2 by mouth at bedtime, Disp: , Rfl: ;  calcium carbonate (OS-CAL) 600 MG TABS tablet, Take 600 mg by mouth 2 (two) times daily with a meal., Disp: , Rfl: ;  cholecalciferol (VITAMIN D) 400 UNITS TABS tablet, Take 400 Units by mouth., Disp: , Rfl: ;  escitalopram (LEXAPRO) 20 MG tablet, 1 po daily, Disp: , Rfl:  estrogens, conjugated, (PREMARIN) 0.9 MG tablet, Take 0.9 mg by mouth daily. Take daily for 21 days then do not take for 7 days., Disp: , Rfl: ;  magnesium gluconate (MAGONATE) 500 MG tablet, Take 500 mg by mouth 2 (two) times daily., Disp: , Rfl: ;  Multiple Vitamins-Minerals (CENTRUM SILVER PO), Take by mouth., Disp: , Rfl: ;  Omega-3 Fatty Acids (FISH OIL) 1000 MG CPDR, Take by mouth., Disp: , Rfl:  omeprazole (PRILOSEC) 40 MG capsule, Take 40 mg by mouth daily., Disp: , Rfl: ;  SUMAtriptan (IMITREX) 100 MG tablet, Take 100 mg by mouth every 2 (two) hours as needed for migraine or headache. May repeat in 2 hours if headache persists or recurs., Disp: , Rfl: ;  topiramate (TOPAMAX) 200 MG tablet, Take 200 mg by mouth 2 (two) times daily., Disp: , Rfl: ;  vitamin E 100 UNIT capsule, Take by mouth daily., Disp: , Rfl:  aspirin-acetaminophen-caffeine (EXCEDRIN MIGRAINE) 250-250-65 MG per tablet, Take by mouth every 6 (six) hours as needed for headache., Disp: , Rfl:   EXAM:  Filed Vitals:   05/24/14 0822  BP: 116/78  Pulse: 72  Temp: 97.8 F (36.6 C)    There is no height on file to calculate BMI.  GENERAL: vitals reviewed and listed above, alert, oriented, appears well hydrated and in no acute distress  HEENT: atraumatic, conjunttiva clear, no obvious abnormalities on inspection of external nose and ears  NECK: no obvious masses on  inspection  LUNGS: clear to auscultation bilaterally, no wheezes, rales or rhonchi, good air movement  CV: HRRR, no peripheral edema  MS: moves all extremities without noticeable abnormality  PSYCH: pleasant and cooperative, no obvious depression or anxiety  ASSESSMENT AND PLAN:  Discussed the following assessment and plan:  Migraine without aura and without status migrainosus, not intractable  Anxiety and depression - managed at [facility]  Gastroesophageal reflux disease without esophagitis  Macular degeneration - goes to [facility] for this  Thoracic back pain, unspecified back pain laterality - Plan: DG Thoracic Spine 2 View  Dyspnea - Plan: DG Chest 2 View  -We reviewed the PMH, PSH, FH, SH, Meds and Allergies. -We provided refills for any medications we will prescribe as needed. -We addressed current concerns per orders and patient instructions. -We have asked for records for pertinent exams, studies, vaccines and notes from previous providers. -We have advised patient to follow up per instructions below. -MMP, updated, follow up physical  -Patient advised to return or notify a doctor immediately if symptoms worsen or persist or new concerns arise.  Patient Instructions  -consider changing xanax to ativan or klonpin - the lowest dose and slowly work to wean off. I do not prescribe this medication so work with your current prescibe or psychiatrist.  -please wean off of premarin very slowly - every other day for 1 month, then 2 days per week for a month, then once per week for a month then stop  FOR YOUR ANXIETY, STRESS or DEPRESSION:  []  continue counseling - this is as effective as medications and will help to get at the root of the imbalance. Please use the number provided to set up an appointment.  []  Ensure AT LEAST 150 minutes of cardiovascular exercise per week - 30 minutes of sweaty exercise daily is best.  []  Set a schedule that includes adequate time for  sleep, fun activities and exercise.  []  Medications are best used short term while finding a healthier more balanced life that promotes good emotional and mental health. I do not prescribe sleep medications such as Ambien, etc. or controlled anxiety medications such as Xanax, Klonopin, etc. long term in adult patients and will have you see a psychiatrist if these types of medications are required on more then a temporary basis.  SCHEDULE YOUR MEDICARE ANNUAL EXAM AS YOU LEAVE TODAY  Get your mammogram  Go get Vianne Bulls.

## 2014-05-24 NOTE — Patient Instructions (Addendum)
-  consider changing xanax to ativan or klonpin - the lowest dose and slowly work to wean off. I do not prescribe this medication so work with your current prescibe or psychiatrist.  -please wean off of premarin very slowly - every other day for 1 month, then 2 days per week for a month, then once per week for a month then stop  FOR YOUR ANXIETY, STRESS or DEPRESSION:  []  continue counseling - this is as effective as medications and will help to get at the root of the imbalance. Please use the number provided to set up an appointment.  []  Ensure AT LEAST 150 minutes of cardiovascular exercise per week - 30 minutes of sweaty exercise daily is best.  []  Set a schedule that includes adequate time for sleep, fun activities and exercise.  []  Medications are best used short term while finding a healthier more balanced life that promotes good emotional and mental health. I do not prescribe sleep medications such as Ambien, etc. or controlled anxiety medications such as Xanax, Klonopin, etc. long term in adult patients and will have you see a psychiatrist if these types of medications are required on more then a temporary basis.  SCHEDULE YOUR MEDICARE ANNUAL EXAM AS YOU LEAVE TODAY  Get your mammogram  Go get xrays

## 2014-05-24 NOTE — Progress Notes (Signed)
Pre visit review using our clinic review tool, if applicable. No additional management support is needed unless otherwise documented below in the visit note. 

## 2014-05-24 NOTE — Addendum Note (Signed)
Addended by: Agnes Lawrence on: 05/24/2014 10:58 AM   Modules accepted: Orders

## 2014-05-26 DIAGNOSIS — H348332 Tributary (branch) retinal vein occlusion, bilateral, stable: Secondary | ICD-10-CM | POA: Insufficient documentation

## 2014-05-28 ENCOUNTER — Telehealth: Payer: Self-pay | Admitting: Family Medicine

## 2014-05-28 NOTE — Telephone Encounter (Signed)
Patient's spouse stopped in the office requesting a referral for the patient to see a dermatologist.

## 2014-05-30 NOTE — Telephone Encounter (Signed)
Pt hus callback and was given names of 3 dermatologists  In area. Pt hus is aware to make sure md in [facility] and that no referral is needed

## 2014-06-04 ENCOUNTER — Ambulatory Visit (INDEPENDENT_AMBULATORY_CARE_PROVIDER_SITE_OTHER)
Admission: RE | Admit: 2014-06-04 | Discharge: 2014-06-04 | Disposition: A | Discharge status: Home / Self Care | Payer: BC Managed Care – PPO | Source: Ambulatory Visit | Attending: Family Medicine | Admitting: Family Medicine

## 2014-06-04 DIAGNOSIS — R0600 Dyspnea, unspecified: Secondary | ICD-10-CM

## 2014-06-04 DIAGNOSIS — R0609 Other forms of dyspnea: Secondary | ICD-10-CM

## 2014-06-04 DIAGNOSIS — R0989 Other specified symptoms and signs involving the circulatory and respiratory systems: Secondary | ICD-10-CM

## 2014-06-04 DIAGNOSIS — M546 Pain in thoracic spine: Secondary | ICD-10-CM

## 2014-06-07 ENCOUNTER — Ambulatory Visit
Admission: RE | Admit: 2014-06-07 | Discharge: 2014-06-07 | Disposition: A | Discharge status: Home / Self Care | Payer: BC Managed Care – PPO | Source: Ambulatory Visit

## 2014-06-07 DIAGNOSIS — Z1231 Encounter for screening mammogram for malignant neoplasm of breast: Secondary | ICD-10-CM

## 2014-06-24 ENCOUNTER — Encounter: Payer: BC Managed Care – PPO | Admitting: Family Medicine

## 2014-07-01 ENCOUNTER — Ambulatory Visit (INDEPENDENT_AMBULATORY_CARE_PROVIDER_SITE_OTHER): Payer: BC Managed Care – PPO | Admitting: Family Medicine

## 2014-07-01 ENCOUNTER — Encounter: Payer: Self-pay | Admitting: Family Medicine

## 2014-07-01 VITALS — BP 110/74 | HR 80 | Temp 98.7°F | Ht 61.0 in | Wt 141.0 lb

## 2014-07-01 DIAGNOSIS — M5134 Other intervertebral disc degeneration, thoracic region: Secondary | ICD-10-CM

## 2014-07-01 DIAGNOSIS — G8929 Other chronic pain: Secondary | ICD-10-CM

## 2014-07-01 DIAGNOSIS — M810 Age-related osteoporosis without current pathological fracture: Secondary | ICD-10-CM

## 2014-07-01 DIAGNOSIS — M549 Dorsalgia, unspecified: Secondary | ICD-10-CM

## 2014-07-01 DIAGNOSIS — IMO0002 Reserved for concepts with insufficient information to code with codable children: Secondary | ICD-10-CM

## 2014-07-01 DIAGNOSIS — Z0000 Encounter for general adult medical examination without abnormal findings: Secondary | ICD-10-CM

## 2014-07-01 DIAGNOSIS — R232 Flushing: Secondary | ICD-10-CM

## 2014-07-01 DIAGNOSIS — N951 Menopausal and female climacteric states: Secondary | ICD-10-CM

## 2014-07-01 LAB — CBC WITH DIFFERENTIAL/PLATELET
Basophils Absolute: 0 10*3/uL (ref 0.0–0.1)
Basophils Relative: 0.4 % (ref 0.0–3.0)
Eosinophils Absolute: 0.1 10*3/uL (ref 0.0–0.7)
Eosinophils Relative: 1.3 % (ref 0.0–5.0)
HCT: 47.3 % — ABNORMAL HIGH (ref 36.0–46.0)
Hemoglobin: 16.1 g/dL — ABNORMAL HIGH (ref 12.0–15.0)
Lymphocytes Relative: 31.5 % (ref 12.0–46.0)
Lymphs Abs: 2.5 10*3/uL (ref 0.7–4.0)
MCHC: 34.1 g/dL (ref 30.0–36.0)
MCV: 96.5 fl (ref 78.0–100.0)
Monocytes Absolute: 0.6 10*3/uL (ref 0.1–1.0)
Monocytes Relative: 7.7 % (ref 3.0–12.0)
Neutro Abs: 4.7 10*3/uL (ref 1.4–7.7)
Neutrophils Relative %: 59.1 % (ref 43.0–77.0)
Platelets: 282 10*3/uL (ref 150.0–400.0)
RBC: 4.91 Mil/uL (ref 3.87–5.11)
RDW: 13.6 % (ref 11.5–15.5)
WBC: 8 10*3/uL (ref 4.0–10.5)

## 2014-07-01 LAB — BASIC METABOLIC PANEL
BUN: 23 mg/dL (ref 6–23)
CO2: 24 mEq/L (ref 19–32)
Calcium: 10.3 mg/dL (ref 8.4–10.5)
Chloride: 104 mEq/L (ref 96–112)
Creatinine, Ser: 1 mg/dL (ref 0.4–1.2)
GFR: 58.11 mL/min — ABNORMAL LOW (ref >60.00)
Glucose, Bld: 90 mg/dL (ref 70–99)
Potassium: 4.1 mEq/L (ref 3.5–5.1)
Sodium: 136 mEq/L (ref 135–145)

## 2014-07-01 LAB — LIPID PANEL
Cholesterol: 191 mg/dL (ref 0–200)
HDL: 66.1 mg/dL (ref >39.00)
LDL Cholesterol: 92 mg/dL (ref 0–99)
NonHDL: 124.9
Total CHOL/HDL Ratio: 3
Triglycerides: 167 mg/dL — ABNORMAL HIGH (ref 0.0–149.0)
VLDL: 33.4 mg/dL (ref 0.0–40.0)

## 2014-07-01 LAB — T4, FREE: Free T4: 0.86 ng/dL (ref 0.60–1.60)

## 2014-07-01 LAB — TSH: TSH: 3.37 u[IU]/mL (ref 0.35–4.50)

## 2014-07-01 NOTE — Progress Notes (Signed)
Medicare Annual Preventive Care Visit  (initial annual wellness or annual wellness exam)  Concerns and/or follow up today:  1) hot flashes: -was to wean off premarin but reports she can't do this as has terrible hot flashes if does -reports: she has not been able to wean off the premarin, reports every time she tries to wean off this happens -reports severe hot flashes for 30 years and wants to see specialist about this  2) Back Pain: -thoracic, xray with mild DDD -reports: not better -denies: weakness, numbness  3)Anxiety and depression: -followed by Dr. Toy Care in [place] -stable    ROS: negative for report of fevers, unintentional weight loss, vision changes, vision loss, hearing loss or change, chest pain, sob, hemoptysis, melena, hematochezia, hematuria, genital discharge or lesions, falls, bleeding or bruising, loc, thoughts of suicide or self harm, memory loss  1.) Patient-completed health risk assessment  - completed and reviewed, see scanned documentation  2.) Review of Medical History: -PMH, PSH, Family History and current specialty and care providers reviewed and updated and listed below  - see scanned in document in chart and below  No past medical history on file.  Past Surgical History  Procedure Laterality Date  . Appendectomy    . Cholecystectomy    . Abdominal hysterectomy    . Abdominal adhesion surgery    . Small intestine surgery    . Colon surgery      History   Social History  . Marital Status: Married    Spouse Name: N/A    Number of Children: N/A  . Years of Education: N/A   Occupational History  . Not on file.   Social History Main Topics  . Smoking status: Never Smoker   . Smokeless tobacco: Not on file  . Alcohol Use: No  . Drug Use: No  . Sexual Activity: Not on file   Other Topics Concern  . Not on file   Social History Narrative   Work or School: none      Home Situation: lives with husband      Spiritual Beliefs:       Lifestyle: no regular exercise; diet is ok             The patient has a family history of  3.) Review of functional ability and level of safety:  Any difficulty hearing? YES - a little  History of falling?  NO  Any trouble with IADLs - using a phone, using transportation, grocery shopping, preparing meals, doing housework, doing laundry, taking medications and managing money? NO  Advance Directives? YES  See summary of recommendations in Patient Instructions below.  4.) Physical Exam Filed Vitals:   07/01/14 1506  BP: 110/74  Pulse: 80  Temp: 98.7 F (37.1 C)   Estimated body mass index is 26.66 kg/(m^2) as calculated from the following:   Height as of this encounter: 5\' 1"  (1.549 m).   Weight as of this encounter: 141 lb (63.957 kg).  EKG (optional): deferred  General: alert, appear well hydrated and in no acute distress  HEENT: visual acuity grossly intact  CV: HRRR  Lungs: CTA bilaterally  Psych: pleasant and cooperative, no obvious depression or anxiety  Mini Cog: 1. Patient instructed to listen carefully and repeat the following: [place]  2. Clock drawing test was administered: NORMAL      3. Recall of three words: 2/3  Patient Score: NEG    See patient instructions for recommendations.  Education and counseling regarding the above review of health provided with a plan for the following: -see scanned patient completed form for further details -fall prevention strategies discussed  -healthy lifestyle discussed -importance and resources for completing advanced directives discussed -see patient instructions below for any other recommendations provided  4)The following written screening schedule of preventive measures were reviewed with assessment and plan made per below, orders and patient instructions:      AAA screening:n/a     Alcohol screening: done     Obesity Screening and counseling: done     STI screening: declined      Tobacco Screening: done       Pneumococcal (PPSV23 -one dose after 64, one before if risk factors), influenza yearly and hepatitis B vaccines (if high risk - end stage renal disease, IV drugs, homosexual men, live in home for mentally retarded, hemophilia receiving factors) ASSESSMENT/PLAN: declined      Screening mammograph (yearly if >40) ASSESSMENT/PLAN: UTD      Screening Pap smear/pelvic exam (q2 years) ASSESSMENT/PLAN: n/a      Prostate cancer screening ASSESSMENT/PLAN: n/a      Colorectal cancer screening (FOBT yearly or flex sig q4y or colonoscopy q10y or barium enema q4y) ASSESSMENT/PLAN: reports had colonoscopy in last 5 years with Dr. Earlean Shawl and she will get the report for [place]      Diabetes outpatient self-management training services ASSESSMENT/PLAN: n/a      Bone mass measurements(covered q2y if indicated - estrogen def, osteoporosis, hyperparathyroid, vertebral abnormalities, osteoporosis or steroids) ASSESSMENT/PLAN: done in 2013      Screening for glaucoma(q1y if high risk - diabetes, FH, AA and > 50 or hispanic and > 65) ASSESSMENT/PLAN: sees optho regularly      Medical nutritional therapy for individuals with diabetes or renal disease ASSESSMENT/PLAN: n/a      Cardiovascular screening blood tests (lipids q5y) ASSESSMENT/PLAN: wants to do non fasting today      Diabetes screening tests ASSESSMENT/PLAN: today   7.) Summary: -risk factors and conditions per above assessment were discussed and treatment, recommendations and referrals were offered per documentation above and orders and patient instructions.  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 - Plan: Ambulatory referral to Orthopedic Surgery  Hot flashes - Plan: TSH, T4, Free, Ambulatory referral to Endocrinology -chronic, severe, unresponsive to treatment per her report -she wants to see a specialist - referral placed,  continue premarin for now as they seem worse per her report when tried to wean, she does not wish to keep taking this - but may try half dose instead  Encounter for Medicare annual wellness exam - Plan: Lipid Panel, Basic metabolic panel, CBC with Differential  Osteoporosis, unspecified - Plan: Ambulatory referral to Endocrinology  Patient Instructions  -1/2 dose of premarin daily.   -We placed a referral for you as discussed for the back pain. It usually takes about 1-2 weeks to process and schedule this referral. If you have not heard from [place] regarding this appointment in 2 weeks please contact our office.  -please send [place] your last colonoscopy report and let Pricilla Holm know when you last had this  -please bring copy of your living will to [place]  -follow up with me in 3 months    Shingles vaccine offered: declined Pneumonia vaccine offered: declined Tdap offered:declined She wants to see a specialist about her hot flashes

## 2014-07-01 NOTE — Patient Instructions (Addendum)
-  1/2 dose of premarin daily.   -We placed a referral for you as discussed for the back pain. It usually takes about 1-2 weeks to process and schedule this referral. If you have not heard from [place] regarding this appointment in 2 weeks please contact our office.  -please send [place] your last colonoscopy report and let Pricilla Holm know when you last had this  -please bring copy of your living will to [place]  -follow up with me in 3 months

## 2014-07-01 NOTE — Progress Notes (Signed)
Pre visit review using our clinic review tool, if applicable. No additional management support is needed unless otherwise documented below in the visit note. 

## 2014-07-04 ENCOUNTER — Other Ambulatory Visit: Payer: Self-pay | Admitting: Sports Medicine

## 2014-07-04 DIAGNOSIS — M546 Pain in thoracic spine: Secondary | ICD-10-CM

## 2014-07-05 DIAGNOSIS — H35351 Cystoid macular degeneration, right eye: Secondary | ICD-10-CM | POA: Insufficient documentation

## 2014-07-08 ENCOUNTER — Encounter: Payer: Self-pay | Admitting: Family Medicine

## 2014-07-13 ENCOUNTER — Ambulatory Visit
Admission: RE | Admit: 2014-07-13 | Discharge: 2014-07-13 | Disposition: A | Discharge status: Home / Self Care | Payer: BC Managed Care – PPO | Source: Ambulatory Visit | Attending: Sports Medicine | Admitting: Sports Medicine

## 2014-07-13 DIAGNOSIS — M546 Pain in thoracic spine: Secondary | ICD-10-CM

## 2014-07-13 MED ORDER — GADOBENATE DIMEGLUMINE 529 MG/ML IV SOLN: 13.0000 mL | Freq: Once | INTRAVENOUS | Status: AC | PRN

## 2014-07-24 ENCOUNTER — Encounter: Payer: Self-pay | Admitting: Endocrinology

## 2014-07-24 ENCOUNTER — Ambulatory Visit (INDEPENDENT_AMBULATORY_CARE_PROVIDER_SITE_OTHER): Payer: BC Managed Care – PPO | Admitting: Endocrinology

## 2014-07-24 VITALS — BP 116/70 | HR 85 | Temp 98.5°F | Ht 61.0 in | Wt 142.0 lb

## 2014-07-24 DIAGNOSIS — M810 Age-related osteoporosis without current pathological fracture: Secondary | ICD-10-CM

## 2014-07-24 MED ORDER — AMITRIPTYLINE HCL 25 MG PO TABS: 25.0000 mg | ORAL_TABLET | Freq: Every day | ORAL | Status: DC

## 2014-07-24 NOTE — Patient Instructions (Addendum)
it is critically important to prevent falling down (keep floor areas well-lit, dry, and free of loose objects.  If you have a cane, walker, or wheelchair, you should use it, even for short trips around the house.  Also, try not to rush). Let's recheck the bone-density test.  you will receive a phone call, about a day and time for an appointment. i have sent a prescription to your pharmacy, for the symptoms.   Please call in 1-2 weeks, to tell us how your symptoms are doing.

## 2014-07-24 NOTE — Progress Notes (Signed)
Subjective:    Patient ID: Danielle Gaines, female    DOB: 09-30-1941, 73 y.o.   MRN: [id number]  HPI In 1972, pt had TAH due to excessive bleeding.  She has been on premarin since then.  She had bilat USO's over the subsequent decade.  She was dx'ed with osteoporosis in approx 2005.  She has been on fosamax since 2012.  She has been advised to d/c it, but she is hesitant, due to sxs whenever she does: intermittent severe "hot flashes" throughout the body, and assoc excessive diaphoresis.  She even gets these sxs despite being on the premarin.  She is unaware of ever being on other rx for menopausal sxs.   No past medical history on file.  Past Surgical History  Procedure Laterality Date  . Appendectomy    . Cholecystectomy    . Abdominal hysterectomy    . Abdominal adhesion surgery    . Small intestine surgery    . Colon surgery      History   Social History  . Marital Status: Married    Spouse Name: N/A    Number of Children: N/A  . Years of Education: N/A   Occupational History  . Not on file.   Social History Main Topics  . Smoking status: Never Smoker   . Smokeless tobacco: Not on file  . Alcohol Use: No  . Drug Use: No  . Sexual Activity: Not on file   Other Topics Concern  . Not on file   Social History Narrative   Work or School: none      Home Situation: lives with husband      Spiritual Beliefs:       Lifestyle: no regular exercise; diet is ok             Current Outpatient Prescriptions on File Prior to Visit  Medication Sig Dispense Refill  . alendronate (FOSAMAX) 70 MG tablet Take 1 tablet (70 mg total) by mouth once a week.  4 tablet  11  . ALPRAZolam (XANAX) 1 MG tablet Take 1 mg by mouth at bedtime as needed for anxiety. Take 1.5 tablet at bedtime Rx given by psychiatrist      . aspirin-acetaminophen-caffeine (EXCEDRIN MIGRAINE) 250-250-65 MG per tablet Take by mouth every 6 (six) hours as needed for headache.      . calcium carbonate (OS-CAL)  600 MG TABS tablet Take 600 mg by mouth 2 (two) times daily with a meal.      . cholecalciferol (VITAMIN D) 400 UNITS TABS tablet Take 400 Units by mouth.      . magnesium gluconate (MAGONATE) 500 MG tablet Take 500 mg by mouth 2 (two) times daily.      . Multiple Vitamins-Minerals (CENTRUM SILVER PO) Take by mouth.      . Omega-3 Fatty Acids (FISH OIL) 1000 MG CPDR Take by mouth.      Marland Kitchen omeprazole (PRILOSEC) 40 MG capsule Take 40 mg by mouth daily.      . SUMAtriptan (IMITREX) 100 MG tablet Take 100 mg by mouth every 2 (two) hours as needed for migraine or headache. May repeat in 2 hours if headache persists or recurs.      . topiramate (TOPAMAX) 200 MG tablet Take 200 mg by mouth 2 (two) times daily.      . vitamin E 100 UNIT capsule Take by mouth daily.      Marland Kitchen escitalopram (LEXAPRO) 20 MG tablet 1 po daily  No current facility-administered medications on file prior to visit.    Allergies  Allergen Reactions  . Sulfonamide Derivatives     Family History  Problem Relation Age of Onset  . Kidney disease Mother   . Heart disease Mother     BP 116/70  Pulse 85  Temp(Src) 98.5 F (36.9 C) (Oral)  Ht 5\' 1"  (1.549 m)  Wt 142 lb (64.411 kg)  BMI 26.84 kg/m2  SpO2 97%  Review of Systems She reports heat intolerance, low-back pain, easy bruising, rhinorrhea, eczema, weight gain, visual loss, hearing loss, and depression.  denies falls, cramps, sob, chest pain, n/v, and memory loss.     Objective:   Physical Exam VS: see vs page GEN: no distress HEAD: head: no deformity eyes: no periorbital swelling, no proptosis external nose and ears are normal mouth: no lesion seen NECK: supple, thyroid is not enlarged CHEST WALL: no deformity LUNGS:  Clear to auscultation CV: reg rate and rhythm, no murmur ABD: abdomen is soft, nontender.  no hepatosplenomegaly.  not distended.  no hernia MUSCULOSKELETAL: muscle bulk and strength are grossly normal.  no obvious joint swelling.  gait  is normal and steady EXTEMITIES: no deformity.  no ulcer on the feet.  feet are of normal color and temp.  no edema PULSES: dorsalis pedis intact bilat.  no carotid bruit NEURO:  cn 2-12 grossly intact.   readily moves all 4's.  sensation is intact to touch on the feet SKIN:  Normal texture and temperature.  No rash or suspicious lesion is visible.   NODES:  None palpable at the neck PSYCH: alert, well-oriented.  Does not appear anxious nor depressed.   (i reviewed dexa result from 2013)  i have reviewed the following outside records: Office notes  Lab Results  Component Value Date   CREATININE 1.0 07/01/2014   BUN 23 07/01/2014   NA 136 07/01/2014   K 4.1 07/01/2014   CL 104 07/01/2014   CO2 24 07/01/2014      Assessment & Plan:  Osteoporosis: moderate exacerbation. Menopausal sxs, new to me.  If this doesn't help sxs, we can try other anticholinergics.  Patient is advised the following:  Patient Instructions  it is critically important to prevent falling down (keep floor areas well-lit, dry, and free of loose objects.  If you have a cane, walker, or wheelchair, you should use it, even for short trips around the house.  Also, try not to rush). Let's recheck the bone-density test.  you will receive a phone call, about a day and time for an appointment. i have sent a prescription to your pharmacy, for the symptoms.   Please call in 1-2 weeks, to tell us how your symptoms are doing.

## 2014-07-30 ENCOUNTER — Encounter: Payer: Self-pay | Admitting: Family Medicine

## 2014-08-06 ENCOUNTER — Encounter: Payer: Self-pay | Admitting: Family Medicine

## 2014-08-13 ENCOUNTER — Ambulatory Visit: Payer: BC Managed Care – PPO

## 2014-08-15 ENCOUNTER — Telehealth: Payer: Self-pay | Admitting: Endocrinology

## 2014-08-15 MED ORDER — HYOSCYAMINE SULFATE ER 0.375 MG PO TB12: 0.3750 mg | ORAL_TABLET | Freq: Two times a day (BID) | ORAL | Status: DC | PRN

## 2014-08-15 NOTE — Telephone Encounter (Signed)
Patients husband called stating that Dr. Loanne Drilling prescribed her Premarin  The patient has not seen any improvement;still having hot flashes   Please advise patient   Thank You

## 2014-08-15 NOTE — Telephone Encounter (Signed)
See below and please advise, Thanks!  

## 2014-08-15 NOTE — Telephone Encounter (Signed)
Ok, please change to a pill that i have sent to your pharmacy today. i hope this helps.  If not, i do not know of any others which would help.

## 2014-08-16 NOTE — Telephone Encounter (Signed)
Pt advised of new medication and voiced understanding.

## 2014-08-20 ENCOUNTER — Ambulatory Visit: Payer: BC Managed Care – PPO

## 2014-08-27 ENCOUNTER — Ambulatory Visit: Payer: BC Managed Care – PPO

## 2014-09-11 ENCOUNTER — Other Ambulatory Visit: Payer: Self-pay

## 2014-09-11 MED ORDER — HYOSCYAMINE SULFATE ER 0.375 MG PO TB12: 0.3750 mg | ORAL_TABLET | Freq: Two times a day (BID) | ORAL | Status: DC | PRN

## 2014-10-01 ENCOUNTER — Encounter: Payer: Self-pay | Admitting: Family Medicine

## 2014-10-01 ENCOUNTER — Telehealth: Payer: Self-pay | Admitting: Family Medicine

## 2014-10-01 ENCOUNTER — Ambulatory Visit (INDEPENDENT_AMBULATORY_CARE_PROVIDER_SITE_OTHER): Payer: BC Managed Care – PPO | Admitting: Family Medicine

## 2014-10-01 ENCOUNTER — Ambulatory Visit: Payer: BC Managed Care – PPO | Admitting: Family Medicine

## 2014-10-01 VITALS — BP 118/84 | HR 84 | Temp 97.7°F | Ht 61.0 in | Wt 143.1 lb

## 2014-10-01 DIAGNOSIS — K068 Other specified disorders of gingiva and edentulous alveolar ridge: Secondary | ICD-10-CM

## 2014-10-01 DIAGNOSIS — Z23 Encounter for immunization: Secondary | ICD-10-CM

## 2014-10-01 DIAGNOSIS — F419 Anxiety disorder, unspecified: Secondary | ICD-10-CM

## 2014-10-01 DIAGNOSIS — M810 Age-related osteoporosis without current pathological fracture: Secondary | ICD-10-CM

## 2014-10-01 DIAGNOSIS — R202 Paresthesia of skin: Secondary | ICD-10-CM

## 2014-10-01 DIAGNOSIS — K055 Other periodontal diseases: Secondary | ICD-10-CM

## 2014-10-01 DIAGNOSIS — L853 Xerosis cutis: Secondary | ICD-10-CM

## 2014-10-01 DIAGNOSIS — H3500 Unspecified background retinopathy: Secondary | ICD-10-CM

## 2014-10-01 DIAGNOSIS — G43009 Migraine without aura, not intractable, without status migrainosus: Secondary | ICD-10-CM

## 2014-10-01 DIAGNOSIS — F32A Depression, unspecified: Secondary | ICD-10-CM

## 2014-10-01 DIAGNOSIS — F329 Major depressive disorder, single episode, unspecified: Secondary | ICD-10-CM

## 2014-10-01 DIAGNOSIS — H3530 Unspecified macular degeneration: Secondary | ICD-10-CM

## 2014-10-01 DIAGNOSIS — H3509 Other intraretinal microvascular abnormalities: Secondary | ICD-10-CM

## 2014-10-01 DIAGNOSIS — F418 Other specified anxiety disorders: Secondary | ICD-10-CM

## 2014-10-01 NOTE — Progress Notes (Signed)
No chief complaint on file.   HPI:  Follow up:  Anxiety and Depression: -takes cymbalta, lexapro and xanax -managed by psych ([facility]) - She is transitioning to Dr. Toy Care -she feels like she is doing much better -no thoughts of self harm, hx of remote hospitalization for depression, no guns in home  Chronic back pain: -sees spinal specialist for this  Osteoporosis/hot flashes: -saw Dr. Loanne Drilling for this recently and dd trial premarin and did not help, then levbid which she reports has helped  Migraines: -uses topamax, imitrex and excedrin for this -managed by Dr. Sima Matas in [facility] -tingling sensation in bilat entire legs bilat  Hx retinal tear/Retinal Occlusions/Bleeding gums: -seeing retinal specialist today whom advised hematology referral - placed -reports sees dentist q6 months - reports told has gingivitis -using a mouthwass which has helped -no bruising or bleeding otherwise  Mild tingly sensation in leg: -bilat, throughout for a long time > 7 months -denies: pain, weakness, numbness  ROS: See pertinent positives and negatives per HPI.  No past medical history on file.  Past Surgical History  Procedure Laterality Date  . Appendectomy    . Cholecystectomy    . Abdominal hysterectomy    . Abdominal adhesion surgery    . Small intestine surgery    . Colon surgery      Family History  Problem Relation Age of Onset  . Kidney disease Mother   . Heart disease Mother     History   Social History  . Marital Status: Married    Spouse Name: N/A    Number of Children: N/A  . Years of Education: N/A   Social History Main Topics  . Smoking status: Never Smoker   . Smokeless tobacco: None  . Alcohol Use: No  . Drug Use: No  . Sexual Activity: None   Other Topics Concern  . None   Social History Narrative   Work or School: none      Home Situation: lives with husband      Spiritual Beliefs:       Lifestyle: no regular exercise; diet is ok            Current outpatient prescriptions: alendronate (FOSAMAX) 70 MG tablet, Take 1 tablet (70 mg total) by mouth once a week., Disp: 4 tablet, Rfl: 11;  ALPRAZolam (XANAX) 1 MG tablet, Take 1 mg by mouth at bedtime as needed for anxiety. Take 1.5 tablet at bedtime Rx given by psychiatrist, Disp: , Rfl: ;  aspirin-acetaminophen-caffeine (EXCEDRIN MIGRAINE) 250-250-65 MG per tablet, Take by mouth every 6 (six) hours as needed for headache., Disp: , Rfl:  BIOTIN PO, Take by mouth daily., Disp: , Rfl: ;  buPROPion (WELLBUTRIN XL) 150 MG 24 hr tablet, Take 150 mg by mouth., Disp: , Rfl: ;  calcium carbonate (OS-CAL) 600 MG TABS tablet, Take 600 mg by mouth 2 (two) times daily with a meal., Disp: , Rfl: ;  carboxymethylcellulose 1 % ophthalmic solution, 1 drop 3 (three) times daily., Disp: , Rfl: ;  Carboxymethylcellulose Sodium (THERATEARS) 0.25 % SOLN, Apply to eye., Disp: , Rfl:  cholecalciferol (VITAMIN D) 400 UNITS TABS tablet, Take 400 Units by mouth., Disp: , Rfl: ;  cycloSPORINE (RESTASIS) 0.05 % ophthalmic emulsion, 1 drop 2 (two) times daily., Disp: , Rfl: ;  DULoxetine (CYMBALTA) 60 MG capsule, Take 60 mg by mouth., Disp: , Rfl: ;  DULoxetine (CYMBALTA) 60 MG capsule, , Disp: , Rfl: ;  escitalopram (LEXAPRO) 20 MG tablet, 1 po daily,  Disp: , Rfl:  estrogens, conjugated, (PREMARIN) 0.9 MG tablet, 1/2 tab daily, Disp: , Rfl: ;  GUAIFENESIN PO, Take by mouth., Disp: , Rfl: ;  hyoscyamine (LEVBID) 0.375 MG 12 hr tablet, Take 1 tablet (0.375 mg total) by mouth every 12 (twelve) hours as needed (for symptoms)., Disp: 60 tablet, Rfl: 0;  magnesium gluconate (MAGONATE) 500 MG tablet, Take 500 mg by mouth 2 (two) times daily., Disp: , Rfl: ;  meloxicam (MOBIC) 7.5 MG tablet, , Disp: , Rfl:  Multiple Vitamins-Minerals (CENTRUM SILVER PO), Take by mouth., Disp: , Rfl: ;  NON FORMULARY, Zenepep, Disp: , Rfl: ;  Olopatadine HCl (PATADAY) 0.2 % SOLN, Apply to eye., Disp: , Rfl: ;  Omega-3 Fatty Acids (FISH OIL) 1000  MG CPDR, Take by mouth., Disp: , Rfl: ;  omeprazole (PRILOSEC) 40 MG capsule, Take 40 mg by mouth daily., Disp: , Rfl: ;  Probiotic Product (ALIGN PO), Take by mouth daily., Disp: , Rfl:  SUMAtriptan (IMITREX) 100 MG tablet, Take 100 mg by mouth every 2 (two) hours as needed for migraine or headache. May repeat in 2 hours if headache persists or recurs., Disp: , Rfl: ;  topiramate (TOPAMAX) 200 MG tablet, Take 200 mg by mouth 2 (two) times daily., Disp: , Rfl: ;  vitamin E 100 UNIT capsule, Take by mouth daily., Disp: , Rfl:   EXAM:  Filed Vitals:   10/01/14 1305  BP: 118/84  Pulse: 84  Temp: 97.7 F (36.5 C)    Body mass index is 27.05 kg/(m^2).  GENERAL: vitals reviewed and listed above, alert, oriented, appears well hydrated and in no acute distress  HEENT: atraumatic, conjunttiva clear, no obvious abnormalities on inspection of external nose and ears  NECK: no obvious masses on inspection  LUNGS: clear to auscultation bilaterally, no wheezes, rales or rhonchi, good air movement  CV: HRRR, no peripheral edema  SKIN: dry skin on legs  MS: moves all extremities without noticeable abnormality  PSYCH: pleasant and cooperative, no obvious depression or anxiety  NEURO: normal sensation to light touch and strength in le bilat  ASSESSMENT AND PLAN:  Discussed the following assessment and plan:  Dry skin -cerave cream, sees dermatologist and has follow up  Paresthesia -I am not sure if tingly sensation in legs if related to very dry skin or another etiology. Discussed etiologies and workup and she is seeing her neurologist soon and opted to discuss with them. Normal exam.  Migraine without aura and without status migrainosus, not intractable  Anxiety and depression - managed at [facility]  Osteoporosis  Macular degeneration - goes to [facility] for this  Retinal vascular disorder Bleeding gums -referral to hematology placed per request  -Patient advised to return or  notify a doctor immediately if symptoms worsen or persist or new concerns arise.  Patient Instructions  BEFORE YOU LEAVE: -flu shot -prevnar 13  -We placed a referral for you as discussed to the hematologist per your eye doctor's request. It usually takes about 1-2 weeks to process and schedule this referral. If you have not heard from [place] regarding this appointment in 2 weeks please contact our office.  -please use cerave cream (not lotion) on legs at least twice per day  -please see the dermatologist        Colin Benton R.

## 2014-10-01 NOTE — Telephone Encounter (Addendum)
Received call from Dr. Silvestre Gunner - Retinal Specialist. He is requesting an urgent referral to a hematologist due to vascular occlusion. He will be leaving the state soon and will set pt up with retinal specialist but requests urgent hematology referral. Referral placed. Requested his office fax records.

## 2014-10-01 NOTE — Progress Notes (Signed)
Pre visit review using our clinic review tool, if applicable. No additional management support is needed unless otherwise documented below in the visit note. 

## 2014-10-01 NOTE — Addendum Note (Signed)
Addended by: Agnes Lawrence on: 10/01/2014 02:18 PM   Modules accepted: Orders

## 2014-10-01 NOTE — Patient Instructions (Signed)
BEFORE YOU LEAVE: -flu shot -prevnar 13  -We placed a referral for you as discussed to the hematologist per your eye doctor's request. It usually takes about 1-2 weeks to process and schedule this referral. If you have not heard from [place] regarding this appointment in 2 weeks please contact our office.  -please use cerave cream (not lotion) on legs at least twice per day  -please see the dermatologist

## 2014-10-09 ENCOUNTER — Other Ambulatory Visit: Payer: Self-pay

## 2014-10-09 ENCOUNTER — Telehealth: Payer: Self-pay | Admitting: Hematology and Oncology

## 2014-10-09 MED ORDER — HYOSCYAMINE SULFATE ER 0.375 MG PO TB12: 0.3750 mg | ORAL_TABLET | Freq: Two times a day (BID) | ORAL | Status: DC | PRN

## 2014-10-09 NOTE — Telephone Encounter (Signed)
S/W PATIENT AND GAVE NP APPT FOR 11/23 @ 11:15 W/DR. [place].

## 2014-10-22 ENCOUNTER — Ambulatory Visit: Payer: BC Managed Care – PPO

## 2014-10-22 ENCOUNTER — Encounter: Payer: Self-pay | Admitting: Hematology and Oncology

## 2014-10-22 ENCOUNTER — Ambulatory Visit (HOSPITAL_BASED_OUTPATIENT_CLINIC_OR_DEPARTMENT_OTHER): Payer: BC Managed Care – PPO | Admitting: Hematology and Oncology

## 2014-10-22 VITALS — BP 157/71 | HR 106 | Temp 98.2°F | Resp 18 | Ht 61.0 in | Wt 145.2 lb

## 2014-10-22 DIAGNOSIS — H538 Other visual disturbances: Secondary | ICD-10-CM

## 2014-10-22 DIAGNOSIS — R040 Epistaxis: Secondary | ICD-10-CM

## 2014-10-23 NOTE — Assessment & Plan Note (Signed)
She has occasional nosebleeds once or twice a week which is of small amount. The patient takes regular aspirin-containing over-the-counter medicine for headaches and joint pain. I suspect the cause of it is related to this. She is reluctant to stop taking these medications because of these medications has helped with her arthritis pain.

## 2014-10-23 NOTE — Assessment & Plan Note (Signed)
The patient has seen an ophthalmologist and had recurrent eyes surgery. She also was told that she has "swelling" behind her eyes with intermittent blurriness of vision. I told the patient that her symptoms are not compatible with a bleeding disorder. I reassured the patient that at this point in time that is no indication to test her for bleeding disorder. To test her for bleeding disorder, she needs to come off all nonsteroidal anti-inflammatory medications and aspirin-containing products for minimum of 2 weeks and she is reluctant to do that. The patient had numerous surgical procedures in the past without any excessive bleeding or transfusion.

## 2014-10-23 NOTE — Progress Notes (Signed)
[facility] CONSULT NOTE  Patient Care Team: Lucretia Kern, DO as PCP - General (Family Medicine)  CHIEF COMPLAINTS/PURPOSE OF CONSULTATION:  Questionable bleeding disorder, intermittent blurriness of vision  HISTORY OF PRESENTING ILLNESS:  Danielle Gaines 73 y.o. female is here because of concerns for bleeding disorder. The patient had numerous surgeries including appendectomy, cholecystectomy, hysterectomy, intestinal/colon surgery, adhesion surgery) removal without any excessive bleeding. She has chronic arthritis pain and take a regular aspirin-containing products and nonsteroidal anti-inflammatory medications. Recently, she has intermittent blurriness of vision and saw an ophthalmologist who felt that she may have some swelling behind her eyes, questionable bleeding within the retina bed. She has occasional gum bleeding when she pressures her teeth which was thought to be related to gingivitis. She also has occasional nose bleed once or twice a week of small amounts. She denies hematuria or hematochezia.  MEDICAL HISTORY:  Past Medical History  Diagnosis Date  . Osteoporosis   . Neuromuscular disorder     migraines    SURGICAL HISTORY: Past Surgical History  Procedure Laterality Date  . Appendectomy    . Cholecystectomy    . Abdominal hysterectomy    . Abdominal adhesion surgery    . Small intestine surgery    . Colon surgery    . Ovarian cyst surgery      SOCIAL HISTORY: History   Social History  . Marital Status: Married    Spouse Name: N/A    Number of Children: N/A  . Years of Education: N/A   Occupational History  . Not on file.   Social History Main Topics  . Smoking status: Never Smoker   . Smokeless tobacco: Never Used  . Alcohol Use: No  . Drug Use: No  . Sexual Activity: Not on file   Other Topics Concern  . Not on file   Social History Narrative   Work or School: none      Home Situation: lives with husband      Spiritual  Beliefs:       Lifestyle: no regular exercise; diet is ok             FAMILY HISTORY: Family History  Problem Relation Age of Onset  . Kidney disease Mother   . Heart disease Mother   . Cancer Maternal Aunt     intestinal and liver cancer    ALLERGIES:  is allergic to sulfonamide derivatives.  MEDICATIONS:  Current Outpatient Prescriptions  Medication Sig Dispense Refill  . alendronate (FOSAMAX) 70 MG tablet Take 1 tablet (70 mg total) by mouth once a week. 4 tablet 11  . ALPRAZolam (XANAX) 1 MG tablet Take 1 mg by mouth at bedtime as needed for anxiety. Take 1.5 tablet at bedtime Rx given by psychiatrist    . aspirin-acetaminophen-caffeine (EXCEDRIN MIGRAINE) 250-250-65 MG per tablet Take by mouth every 6 (six) hours as needed for headache.    Marland Kitchen BIOTIN PO Take by mouth daily.    Marland Kitchen buPROPion (WELLBUTRIN XL) 150 MG 24 hr tablet Take 150 mg by mouth.    . calcium carbonate (OS-CAL) 600 MG TABS tablet Take 600 mg by mouth 2 (two) times daily with a meal.    . cholecalciferol (VITAMIN D) 400 UNITS TABS tablet Take 400 Units by mouth.    . cycloSPORINE (RESTASIS) 0.05 % ophthalmic emulsion 1 drop 2 (two) times daily.    . DULoxetine (CYMBALTA) 60 MG capsule Take 60 mg by mouth.    . DULoxetine (CYMBALTA)  60 MG capsule     . magnesium gluconate (MAGONATE) 500 MG tablet Take 500 mg by mouth 2 (two) times daily.    . meloxicam (MOBIC) 7.5 MG tablet Take by mouth as needed.     . Multiple Vitamins-Minerals (CENTRUM SILVER PO) Take by mouth.    . NON FORMULARY Zenepep    . Omega-3 Fatty Acids (FISH OIL) 1000 MG CPDR Take by mouth.    Marland Kitchen omeprazole (PRILOSEC) 40 MG capsule Take 40 mg by mouth daily.    . Probiotic Product (ALIGN PO) Take by mouth daily.    . SUMAtriptan (IMITREX) 100 MG tablet Take 100 mg by mouth every 2 (two) hours as needed for migraine or headache. May repeat in 2 hours if headache persists or recurs.    . topiramate (TOPAMAX) 200 MG tablet Take 200 mg by mouth 2  (two) times daily.    . vitamin E 100 UNIT capsule Take by mouth daily.     No current facility-administered medications for this visit.    REVIEW OF SYSTEMS:   Constitutional: Denies fevers, chills or abnormal night sweats Ears, nose, mouth, throat, and face: Denies mucositis or sore throat Respiratory: Denies cough, dyspnea or wheezes Cardiovascular: Denies palpitation, chest discomfort or lower extremity swelling Gastrointestinal:  Denies nausea, heartburn or change in bowel habits Skin: Denies abnormal skin rashes Lymphatics: Denies new lymphadenopathy or easy bruising Neurological:Denies numbness, tingling or new weaknesses Behavioral/Psych: Mood is stable, no new changes  All other systems were reviewed with the patient and are negative.  PHYSICAL EXAMINATION: ECOG PERFORMANCE STATUS: 0 - Asymptomatic  Filed Vitals:   10/22/14 1135  BP: 157/71  Pulse: 106  Temp: 98.2 F (36.8 C)  Resp: 18   Filed Weights   10/22/14 1135  Weight: 145 lb 3.2 oz (65.862 kg)    GENERAL:alert, no distress and comfortable SKIN: skin color, texture, turgor are normal, no rashes or significant lesions EYES: normal, conjunctiva are pink and non-injected, sclera clear OROPHARYNX:no exudate, no erythema and lips, buccal mucosa, and tongue normal  NECK: supple, thyroid normal size, non-tender, without nodularity LYMPH:  no palpable lymphadenopathy in the cervical, axillary or inguinal LUNGS: clear to auscultation and percussion with normal breathing effort HEART: regular rate & rhythm and no murmurs and no lower extremity edema ABDOMEN:abdomen soft, non-tender and normal bowel sounds Musculoskeletal:no cyanosis of digits and no clubbing  PSYCH: alert & oriented x 3 with fluent speech NEURO: no focal motor/sensory deficits  LABORATORY DATA:  I have reviewed the data as listed Lab Results  Component Value Date   WBC 8.0 07/01/2014   HGB 16.1* 07/01/2014   HCT 47.3* 07/01/2014   MCV 96.5  07/01/2014   PLT 282.0 07/01/2014    Recent Labs  07/01/14 1543  NA 136  K 4.1  CL 104  CO2 24  GLUCOSE 90  BUN 23  CREATININE 1.0  CALCIUM 10.3    ASSESSMENT & PLAN:  Epistaxis She has occasional nosebleeds once or twice a week which is of small amount. The patient takes regular aspirin-containing over-the-counter medicine for headaches and joint pain. I suspect the cause of it is related to this. She is reluctant to stop taking these medications because of these medications has helped with her arthritis pain.   Blurry vision, bilateral The patient has seen an ophthalmologist and had recurrent eyes surgery. She also was told that she has "swelling" behind her eyes with intermittent blurriness of vision. I told the patient that her symptoms  are not compatible with a bleeding disorder. I reassured the patient that at this point in time that is no indication to test her for bleeding disorder. To test her for bleeding disorder, she needs to come off all nonsteroidal anti-inflammatory medications and aspirin-containing products for minimum of 2 weeks and she is reluctant to do that. The patient had numerous surgical procedures in the past without any excessive bleeding or transfusion.      All questions were answered. The patient knows to call the clinic with any problems, questions or concerns. I spent 40 minutes counseling the patient face to face. The total time spent in the appointment was 55 minutes and more than 50% was on counseling.     McGraw, [place], MD 10/23/2014 7:07 AM

## 2014-12-09 ENCOUNTER — Telehealth: Payer: Self-pay

## 2014-12-09 NOTE — Telephone Encounter (Signed)
Received a refill request for Hyoscyamine. Could not locate on pt's current medication list. Please advise, Thanks!

## 2014-12-09 NOTE — Telephone Encounter (Signed)
This med was filled here per the protocol, but she has only been seen here for osteoporosis. You could ask dr Maudie Mercury, but i don't know if she is prescribing dr, either.

## 2014-12-10 NOTE — Telephone Encounter (Signed)
Pharmacy advised  

## 2014-12-11 ENCOUNTER — Other Ambulatory Visit: Payer: Self-pay | Admitting: Family Medicine

## 2014-12-12 ENCOUNTER — Other Ambulatory Visit: Payer: Self-pay | Admitting: Endocrinology

## 2014-12-17 ENCOUNTER — Telehealth: Payer: Self-pay | Admitting: Family Medicine

## 2014-12-17 MED ORDER — HYOSCYAMINE SULFATE 0.125 MG SL SUBL: 0.1250 mg | SUBLINGUAL_TABLET | SUBLINGUAL | Status: DC | PRN

## 2014-12-17 NOTE — Telephone Encounter (Signed)
Dr. Loanne Drilling,  Danielle Gaines is requestinga refill on hyoscyamine for hot flashes. Per her report at her visit with me and review of her last office visit with you and and a phone note from you from 08/15/14 it looks like when she said premarin did not work for her hotflashes you sent levbid to the pharmacy. I am not experienced with using levbid for hot flashes. Is this something you would advise she continue long term on a regular basis for this? Do you want her to schedule an appointment with you to follow up on this?  Thank you for your advice regarding this matter. Jarrett Soho

## 2014-12-17 NOTE — Telephone Encounter (Signed)
Pt called Dr Lissa Merlin office to request a rx for the following med  HYOSCYAMINE 0.375. Dr Lissa Merlin would not refill said that it should be refilled thru her [place]

## 2014-12-17 NOTE — Telephone Encounter (Signed)
please call patient: i have sent a prescription to your pharmacy  

## 2014-12-18 NOTE — Telephone Encounter (Signed)
Lvom advising of note below. Requested call back if pt would like to discuss.  

## 2014-12-31 ENCOUNTER — Telehealth: Payer: Self-pay | Admitting: Endocrinology

## 2014-12-31 MED ORDER — HYOSCYAMINE SULFATE ER 0.375 MG PO TB12: 0.3750 mg | ORAL_TABLET | Freq: Two times a day (BID) | ORAL | Status: DC

## 2014-12-31 NOTE — Telephone Encounter (Signed)
Patient stated that he would prefer if you would put his wife back on the Hyoscyamine 0.125,  It works a lot bett than the, Hyoscyamine 0.375 if possible. Please advise.

## 2014-12-31 NOTE — Telephone Encounter (Signed)
i have sent a prescription to your pharmacy 

## 2014-12-31 NOTE — Telephone Encounter (Signed)
I called pt. Pt states that she would like a refill on Hycosamine 0.375 directions take 1 tablet every 12 hours. Pt states the medication at this dosage works better than the 0.125 mg 1 every 4hrs sublingual.  Ok to change? Thanks!

## 2015-01-01 NOTE — Telephone Encounter (Signed)
Pt advised.

## 2015-01-27 ENCOUNTER — Other Ambulatory Visit: Payer: Self-pay

## 2015-01-27 MED ORDER — HYOSCYAMINE SULFATE ER 0.375 MG PO TB12: 0.3750 mg | ORAL_TABLET | Freq: Two times a day (BID) | ORAL | Status: DC

## 2015-03-03 ENCOUNTER — Other Ambulatory Visit: Payer: Self-pay

## 2015-03-03 MED ORDER — HYOSCYAMINE SULFATE ER 0.375 MG PO TB12: 0.3750 mg | ORAL_TABLET | Freq: Two times a day (BID) | ORAL | Status: DC

## 2015-03-11 ENCOUNTER — Encounter: Payer: Self-pay | Admitting: Family Medicine

## 2015-03-11 ENCOUNTER — Ambulatory Visit (INDEPENDENT_AMBULATORY_CARE_PROVIDER_SITE_OTHER): Payer: BLUE CROSS/BLUE SHIELD | Admitting: Family Medicine

## 2015-03-11 VITALS — BP 104/82 | HR 104 | Temp 97.9°F | Ht 61.0 in | Wt 137.9 lb

## 2015-03-11 DIAGNOSIS — R413 Other amnesia: Secondary | ICD-10-CM

## 2015-03-11 DIAGNOSIS — F418 Other specified anxiety disorders: Secondary | ICD-10-CM | POA: Diagnosis not present

## 2015-03-11 DIAGNOSIS — R29818 Other symptoms and signs involving the nervous system: Secondary | ICD-10-CM

## 2015-03-11 DIAGNOSIS — F329 Major depressive disorder, single episode, unspecified: Secondary | ICD-10-CM

## 2015-03-11 DIAGNOSIS — Z79899 Other long term (current) drug therapy: Secondary | ICD-10-CM

## 2015-03-11 DIAGNOSIS — F32A Depression, unspecified: Secondary | ICD-10-CM

## 2015-03-11 DIAGNOSIS — F419 Anxiety disorder, unspecified: Secondary | ICD-10-CM

## 2015-03-11 DIAGNOSIS — R2689 Other abnormalities of gait and mobility: Secondary | ICD-10-CM

## 2015-03-11 DIAGNOSIS — R300 Dysuria: Secondary | ICD-10-CM | POA: Diagnosis not present

## 2015-03-11 LAB — CBC WITH DIFFERENTIAL/PLATELET
Basophils Absolute: 0 10*3/uL (ref 0.0–0.1)
Basophils Relative: 0.3 % (ref 0.0–3.0)
Eosinophils Absolute: 0.1 10*3/uL (ref 0.0–0.7)
Eosinophils Relative: 1 % (ref 0.0–5.0)
HCT: 43.7 % (ref 36.0–46.0)
Hemoglobin: 14.7 g/dL (ref 12.0–15.0)
Lymphocytes Relative: 31.6 % (ref 12.0–46.0)
Lymphs Abs: 2.1 10*3/uL (ref 0.7–4.0)
MCHC: 33.6 g/dL (ref 30.0–36.0)
MCV: 95.7 fl (ref 78.0–100.0)
Monocytes Absolute: 0.5 10*3/uL (ref 0.1–1.0)
Monocytes Relative: 7.9 % (ref 3.0–12.0)
Neutro Abs: 3.9 10*3/uL (ref 1.4–7.7)
Neutrophils Relative %: 59.2 % (ref 43.0–77.0)
Platelets: 291 10*3/uL (ref 150.0–400.0)
RBC: 4.56 Mil/uL (ref 3.87–5.11)
RDW: 13.5 % (ref 11.5–15.5)
WBC: 6.7 10*3/uL (ref 4.0–10.5)

## 2015-03-11 LAB — BASIC METABOLIC PANEL
BUN: 12 mg/dL (ref 6–23)
CO2: 22 mEq/L (ref 19–32)
Calcium: 10.1 mg/dL (ref 8.4–10.5)
Chloride: 108 mEq/L (ref 96–112)
Creatinine, Ser: 1.2 mg/dL (ref 0.40–1.20)
GFR: 46.99 mL/min — ABNORMAL LOW (ref >60.00)
Glucose, Bld: 103 mg/dL — ABNORMAL HIGH (ref 70–99)
Potassium: 4.1 mEq/L (ref 3.5–5.1)
Sodium: 135 mEq/L (ref 135–145)

## 2015-03-11 LAB — POCT URINALYSIS DIPSTICK
Bilirubin, UA: NEGATIVE
Blood, UA: NEGATIVE
Glucose, UA: NEGATIVE
Ketones, UA: NEGATIVE
Nitrite, UA: NEGATIVE
Protein, UA: NEGATIVE
Spec Grav, UA: <=1.005
Urobilinogen, UA: 0.2
pH, UA: 6.5

## 2015-03-11 NOTE — Progress Notes (Signed)
HPI:  Danielle Gaines has a PMH significan for chronic pain (followed by Dr. Cyd Silence), anxiety and depression (followed by her psychiatrist - Dr. Toy Care), Migraines (Dr. Sima Matas), paresthesias Acute visit for memory issues and balance issues.  Balance Issues/Memory Issues: -started in the last 1-2  Months after making adjustments in her medications with Dr. Toy Care (adderall and librium were started) - apparently to assist her in coming off of xanax -feels off balance and has difficulty recalling names, difficulty with short term memory - feels like in a fog -denies: HA vision, changes, weakness, numbness, falls  Dysuria: -started the last 1 week -burning with urination -denies: fevers, malaise, nausea, vomiting, hematuria  ROS: See pertinent positives and negatives per HPI.  Past Medical History  Diagnosis Date  . Osteoporosis   . Neuromuscular disorder     migraines    Past Surgical History  Procedure Laterality Date  . Appendectomy    . Cholecystectomy    . Abdominal hysterectomy    . Abdominal adhesion surgery    . Small intestine surgery    . Colon surgery    . Ovarian cyst surgery      Family History  Problem Relation Age of Onset  . Kidney disease Mother   . Heart disease Mother   . Cancer Maternal Aunt     intestinal and liver cancer    History   Social History  . Marital Status: Married    Spouse Name: N/A  . Number of Children: N/A  . Years of Education: N/A   Social History Main Topics  . Smoking status: Never Smoker   . Smokeless tobacco: Never Used  . Alcohol Use: No  . Drug Use: No  . Sexual Activity: Not on file   Other Topics Concern  . None   Social History Narrative   Work or School: none      Home Situation: lives with husband      Spiritual Beliefs:       Lifestyle: no regular exercise; diet is ok              Current outpatient prescriptions:  .  alendronate (FOSAMAX) 70 MG tablet, Take 1 tablet (70 mg total) by mouth once a  week., Disp: 4 tablet, Rfl: 11 .  ALPRAZolam (XANAX) 1 MG tablet, Take 1 mg by mouth at bedtime as needed for anxiety. Take 1.5 tablet at bedtime Rx given by psychiatrist, Disp: , Rfl:  .  amphetamine-dextroamphetamine (ADDERALL) 20 MG tablet, Take 20 mg by mouth 2 (two) times daily., Disp: , Rfl:  .  BIOTIN PO, Take by mouth daily., Disp: , Rfl:  .  buPROPion (WELLBUTRIN XL) 150 MG 24 hr tablet, Take 150 mg by mouth., Disp: , Rfl:  .  calcium carbonate (OS-CAL) 600 MG TABS tablet, Take 600 mg by mouth 2 (two) times daily with a meal., Disp: , Rfl:  .  chlordiazePOXIDE (LIBRIUM) 10 MG capsule, Take 10 mg by mouth at bedtime., Disp: , Rfl:  .  cholecalciferol (VITAMIN D) 400 UNITS TABS tablet, Take 400 Units by mouth., Disp: , Rfl:  .  cycloSPORINE (RESTASIS) 0.05 % ophthalmic emulsion, 1 drop 2 (two) times daily., Disp: , Rfl:  .  dexlansoprazole (DEXILANT) 60 MG capsule, Take 60 mg by mouth daily., Disp: , Rfl:  .  DULoxetine (CYMBALTA) 60 MG capsule, Take 60 mg by mouth., Disp: , Rfl:  .  DULoxetine (CYMBALTA) 60 MG capsule, , Disp: , Rfl:  .  hyoscyamine (LEVBID)  0.375 MG 12 hr tablet, Take 1 tablet (0.375 mg total) by mouth 2 (two) times daily., Disp: 60 tablet, Rfl: 0 .  Linaclotide (LINZESS) 290 MCG CAPS capsule, Take 290 mcg by mouth daily., Disp: , Rfl:  .  magnesium gluconate (MAGONATE) 500 MG tablet, Take 500 mg by mouth 2 (two) times daily., Disp: , Rfl:  .  Multiple Vitamins-Minerals (CENTRUM SILVER PO), Take by mouth., Disp: , Rfl:  .  NON FORMULARY, Zenepep, Disp: , Rfl:  .  Omega-3 Fatty Acids (FISH OIL) 1000 MG CPDR, Take by mouth., Disp: , Rfl:  .  Pancrelipase, Lip-Prot-Amyl, (ZENPEP PO), Take by mouth 3 (three) times daily., Disp: , Rfl:  .  Probiotic Product (ALIGN PO), Take by mouth daily., Disp: , Rfl:  .  SUMAtriptan (IMITREX) 100 MG tablet, Take 100 mg by mouth every 2 (two) hours as needed for migraine or headache. May repeat in 2 hours if headache persists or recurs.,  Disp: , Rfl:  .  topiramate (TOPAMAX) 200 MG tablet, Take 200 mg by mouth 2 (two) times daily., Disp: , Rfl:  .  vitamin E 100 UNIT capsule, Take by mouth daily., Disp: , Rfl:   EXAM:  Filed Vitals:   03/11/15 0959  BP: 104/82  Pulse: 104  Temp: 97.9 F (36.6 C)    Body mass index is 26.07 kg/(m^2).  GENERAL: vitals reviewed and listed above, alert, oriented, appears well hydrated and in no acute distress  HEENT: atraumatic, conjunttiva clear, PERRLA, no obvious abnormalities on inspection of external nose and ears  NECK: no obvious masses on inspection  LUNGS: clear to auscultation bilaterally, no wheezes, rales or rhonchi, good air movement  CV: HRRR, no peripheral edema  MS: moves all extremities without noticeable abnormality  PSYCH: pleasant and cooperative, no obvious depression or anxiety  NEURO: CN II-XII grossly intact, mini cog normal - recall 3/3 and normal clock draw, gait slow but normal, finger to nose normal, normal speech, vision grossly intact, strength and DTRs equal and symetric throughout upper and lower extremities  ASSESSMENT AND PLAN:  Discussed the following assessment and plan:  Memory loss Balance disorder - Plan: Basic metabolic panel, CBC with Differential -her exam is benign today without any focal neurodeficits -ongoing for several months since several sig changes in psychiatric medications - favor reaction/interaction to medications and advised discussing with her neurologist -she has some risks factors for vascular dementia so also advised to see her neurologist for eval for this and other causes - advised to call today  Anxiety and depression  Polypharmacy -advised to discuss reduction medications with her specialist and to bring curren tmedication list to all appointments with all doctors  Dysuria - Plan: POC Urinalysis Dipstick, Culture, Urine  -Patient advised to return or notify a doctor immediately if symptoms worsen or persist or  new concerns arise.  Patient Instructions  BEFORE YOU LEAVE: -schedule follow up in 1 month -labs  -We have ordered labs or studies at this visit. It can take up to 1-2 weeks for results and processing. We will contact you with instructions IF your results are abnormal. Normal results will be released to your [facility]. If you have not heard from [place] or can not find your results in [facility] in 2 weeks please contact our office.  -please call your psychiatrist about your concerns regarding your new medications  -please call your neurologist today for appointment for the balance and memory concerns  -please discuss reducing your medication list with your  specialists - please take updated current medication list to all appointments with all doctors        Colin Benton R.

## 2015-03-11 NOTE — Progress Notes (Signed)
Pre visit review using our clinic review tool, if applicable. No additional management support is needed unless otherwise documented below in the visit note. 

## 2015-03-11 NOTE — Patient Instructions (Signed)
BEFORE YOU LEAVE: -schedule follow up in 1 month -labs  -We have ordered labs or studies at this visit. It can take up to 1-2 weeks for results and processing. We will contact you with instructions IF your results are abnormal. Normal results will be released to your [facility]. If you have not heard from [place] or can not find your results in [facility] in 2 weeks please contact our office.  -please call your psychiatrist about your concerns regarding your new medications  -please call your neurologist today for appointment for the balance and memory concerns  -please discuss reducing your medication list with your specialists - please take updated current medication list to all appointments with all doctors

## 2015-03-13 LAB — URINE CULTURE
Colony Count: NO GROWTH
Organism ID, Bacteria: NO GROWTH

## 2015-04-07 ENCOUNTER — Emergency Department (HOSPITAL_COMMUNITY): Payer: BLUE CROSS/BLUE SHIELD

## 2015-04-07 ENCOUNTER — Encounter (HOSPITAL_COMMUNITY): Payer: Self-pay | Admitting: Emergency Medicine

## 2015-04-07 ENCOUNTER — Encounter (HOSPITAL_COMMUNITY): Payer: Medicare Other

## 2015-04-07 ENCOUNTER — Telehealth: Payer: Self-pay | Admitting: *Deleted

## 2015-04-07 ENCOUNTER — Inpatient Hospital Stay (HOSPITAL_COMMUNITY)
Admission: EM | Admit: 2015-04-07 | Discharge: 2015-04-11 | DRG: 683 | Disposition: A | Discharge status: Home Health | Payer: BLUE CROSS/BLUE SHIELD | Attending: Internal Medicine | Admitting: Internal Medicine

## 2015-04-07 DIAGNOSIS — R4182 Altered mental status, unspecified: Secondary | ICD-10-CM

## 2015-04-07 DIAGNOSIS — F332 Major depressive disorder, recurrent severe without psychotic features: Secondary | ICD-10-CM | POA: Diagnosis present

## 2015-04-07 DIAGNOSIS — R29898 Other symptoms and signs involving the musculoskeletal system: Secondary | ICD-10-CM

## 2015-04-07 DIAGNOSIS — K589 Irritable bowel syndrome without diarrhea: Secondary | ICD-10-CM | POA: Diagnosis present

## 2015-04-07 DIAGNOSIS — E876 Hypokalemia: Secondary | ICD-10-CM | POA: Diagnosis present

## 2015-04-07 DIAGNOSIS — Z79899 Other long term (current) drug therapy: Secondary | ICD-10-CM

## 2015-04-07 DIAGNOSIS — F419 Anxiety disorder, unspecified: Secondary | ICD-10-CM | POA: Diagnosis present

## 2015-04-07 DIAGNOSIS — R4781 Slurred speech: Secondary | ICD-10-CM | POA: Diagnosis not present

## 2015-04-07 DIAGNOSIS — M810 Age-related osteoporosis without current pathological fracture: Secondary | ICD-10-CM | POA: Diagnosis present

## 2015-04-07 DIAGNOSIS — Z66 Do not resuscitate: Secondary | ICD-10-CM | POA: Diagnosis present

## 2015-04-07 DIAGNOSIS — N179 Acute kidney failure, unspecified: Secondary | ICD-10-CM | POA: Diagnosis not present

## 2015-04-07 DIAGNOSIS — F329 Major depressive disorder, single episode, unspecified: Secondary | ICD-10-CM | POA: Diagnosis present

## 2015-04-07 DIAGNOSIS — G43909 Migraine, unspecified, not intractable, without status migrainosus: Secondary | ICD-10-CM | POA: Diagnosis present

## 2015-04-07 DIAGNOSIS — E860 Dehydration: Secondary | ICD-10-CM | POA: Diagnosis present

## 2015-04-07 DIAGNOSIS — F32A Depression, unspecified: Secondary | ICD-10-CM | POA: Diagnosis present

## 2015-04-07 DIAGNOSIS — I951 Orthostatic hypotension: Secondary | ICD-10-CM | POA: Diagnosis not present

## 2015-04-07 DIAGNOSIS — K219 Gastro-esophageal reflux disease without esophagitis: Secondary | ICD-10-CM | POA: Diagnosis present

## 2015-04-07 DIAGNOSIS — R739 Hyperglycemia, unspecified: Secondary | ICD-10-CM | POA: Diagnosis present

## 2015-04-07 DIAGNOSIS — R29810 Facial weakness: Secondary | ICD-10-CM

## 2015-04-07 DIAGNOSIS — R296 Repeated falls: Secondary | ICD-10-CM | POA: Diagnosis present

## 2015-04-07 HISTORY — DX: Disease of intestine, unspecified: K63.9

## 2015-04-07 HISTORY — DX: Irritable bowel syndrome, unspecified: K58.9

## 2015-04-07 HISTORY — DX: Depression, unspecified: F32.A

## 2015-04-07 HISTORY — DX: Gastro-esophageal reflux disease without esophagitis: K21.9

## 2015-04-07 HISTORY — DX: Major depressive disorder, single episode, unspecified: F32.9

## 2015-04-07 HISTORY — DX: Migraine, unspecified, not intractable, without status migrainosus: G43.909

## 2015-04-07 HISTORY — DX: Sedative, hypnotic or anxiolytic dependence, uncomplicated: F13.20

## 2015-04-07 LAB — CBC
HCT: 45.1 % (ref 36.0–46.0)
Hemoglobin: 15.6 g/dL — ABNORMAL HIGH (ref 12.0–15.0)
MCH: 31.5 pg (ref 26.0–34.0)
MCHC: 34.6 g/dL (ref 30.0–36.0)
MCV: 91.1 fL (ref 78.0–100.0)
Platelets: 259 10*3/uL (ref 150–400)
RBC: 4.95 MIL/uL (ref 3.87–5.11)
RDW: 13 % (ref 11.5–15.5)
WBC: 13.3 10*3/uL — ABNORMAL HIGH (ref 4.0–10.5)

## 2015-04-07 LAB — COMPREHENSIVE METABOLIC PANEL
ALT: 47 U/L (ref 14–54)
AST: 29 U/L (ref 15–41)
Albumin: 4 g/dL (ref 3.5–5.0)
Alkaline Phosphatase: 80 U/L (ref 38–126)
Anion gap: 7 (ref 5–15)
BUN: 22 mg/dL — ABNORMAL HIGH (ref 6–20)
CO2: 12 mmol/L — ABNORMAL LOW (ref 22–32)
Calcium: 9.3 mg/dL (ref 8.9–10.3)
Chloride: 117 mmol/L — ABNORMAL HIGH (ref 101–111)
Creatinine, Ser: 1.69 mg/dL — ABNORMAL HIGH (ref 0.44–1.00)
GFR calc Af Amer: 34 mL/min — ABNORMAL LOW (ref >60)
GFR calc non Af Amer: 29 mL/min — ABNORMAL LOW (ref >60)
Glucose, Bld: 119 mg/dL — ABNORMAL HIGH (ref 70–99)
Potassium: 2.8 mmol/L — ABNORMAL LOW (ref 3.5–5.1)
Sodium: 136 mmol/L (ref 135–145)
Total Bilirubin: 0.9 mg/dL (ref 0.3–1.2)
Total Protein: 7.3 g/dL (ref 6.5–8.1)

## 2015-04-07 LAB — MAGNESIUM: Magnesium: 2.2 mg/dL (ref 1.7–2.4)

## 2015-04-07 LAB — DIFFERENTIAL
Basophils Absolute: 0 10*3/uL (ref 0.0–0.1)
Basophils Relative: 0 % (ref 0–1)
Eosinophils Absolute: 0 10*3/uL (ref 0.0–0.7)
Eosinophils Relative: 0 % (ref 0–5)
Lymphocytes Relative: 11 % — ABNORMAL LOW (ref 12–46)
Lymphs Abs: 1.4 10*3/uL (ref 0.7–4.0)
Monocytes Absolute: 1 10*3/uL (ref 0.1–1.0)
Monocytes Relative: 7 % (ref 3–12)
Neutro Abs: 10.9 10*3/uL — ABNORMAL HIGH (ref 1.7–7.7)
Neutrophils Relative %: 82 % — ABNORMAL HIGH (ref 43–77)

## 2015-04-07 LAB — URINE MICROSCOPIC-ADD ON

## 2015-04-07 LAB — URINALYSIS, ROUTINE W REFLEX MICROSCOPIC
Bilirubin Urine: NEGATIVE
Glucose, UA: NEGATIVE mg/dL
Ketones, ur: NEGATIVE mg/dL
Leukocytes, UA: NEGATIVE
Nitrite: NEGATIVE
Protein, ur: 30 mg/dL — AB
Specific Gravity, Urine: 1.01 (ref 1.005–1.030)
Urobilinogen, UA: 0.2 mg/dL (ref 0.0–1.0)
pH: 6 (ref 5.0–8.0)

## 2015-04-07 LAB — ETHANOL: Alcohol, Ethyl (B): <5 mg/dL (ref <5)

## 2015-04-07 LAB — I-STAT CG4 LACTIC ACID, ED
Lactic Acid, Venous: 1.6 mmol/L (ref 0.5–2.0)
Lactic Acid, Venous: 0.72 mmol/L (ref 0.5–2.0)

## 2015-04-07 LAB — CK: Total CK: 98 U/L (ref 38–234)

## 2015-04-07 LAB — PROTIME-INR
INR: 1.09 (ref 0.00–1.49)
Prothrombin Time: 14.2 seconds (ref 11.6–15.2)

## 2015-04-07 LAB — I-STAT CHEM 8, ED
BUN: 24 mg/dL — ABNORMAL HIGH (ref 6–20)
Calcium, Ion: 1.49 mmol/L — ABNORMAL HIGH (ref 1.13–1.30)
Chloride: 109 mmol/L (ref 101–111)
Creatinine, Ser: 1.8 mg/dL — ABNORMAL HIGH (ref 0.44–1.00)
Glucose, Bld: 146 mg/dL — ABNORMAL HIGH (ref 70–99)
HCT: 57 % — ABNORMAL HIGH (ref 36.0–46.0)
Hemoglobin: 19.4 g/dL — ABNORMAL HIGH (ref 12.0–15.0)
Potassium: 2.8 mmol/L — ABNORMAL LOW (ref 3.5–5.1)
Sodium: 139 mmol/L (ref 135–145)
TCO2: 12 mmol/L (ref 0–100)

## 2015-04-07 LAB — CREATININE, URINE, RANDOM: Creatinine, Urine: 127.18 mg/dL

## 2015-04-07 LAB — RAPID URINE DRUG SCREEN, HOSP PERFORMED
Amphetamines: POSITIVE — AB
Barbiturates: NOT DETECTED
Benzodiazepines: POSITIVE — AB
Cocaine: NOT DETECTED
Opiates: NOT DETECTED
Tetrahydrocannabinol: NOT DETECTED

## 2015-04-07 LAB — I-STAT TROPONIN, ED: Troponin i, poc: 0 ng/mL (ref 0.00–0.08)

## 2015-04-07 LAB — CBG MONITORING, ED: Glucose-Capillary: 135 mg/dL — ABNORMAL HIGH (ref 70–99)

## 2015-04-07 LAB — SODIUM, URINE, RANDOM: Sodium, Ur: <10 mmol/L

## 2015-04-07 LAB — APTT: aPTT: 31 seconds (ref 24–37)

## 2015-04-07 MED ORDER — HYOSCYAMINE SULFATE ER 0.375 MG PO TB12
0.3750 mg | ORAL_TABLET | Freq: Two times a day (BID) | ORAL | Status: DC | PRN
  Administered 2015-04-10: 0.375 mg via ORAL
  Filled 2015-04-07 (×2): qty 1

## 2015-04-07 MED ORDER — STROKE: EARLY STAGES OF RECOVERY BOOK
Freq: Once | Status: DC
  Filled 2015-04-07: qty 1

## 2015-04-07 MED ORDER — ASPIRIN 300 MG RE SUPP: 300.0000 mg | Freq: Every day | RECTAL | Status: DC

## 2015-04-07 MED ORDER — HEPARIN SODIUM (PORCINE) 5000 UNIT/ML IJ SOLN
5000.0000 [IU] | Freq: Three times a day (TID) | INTRAMUSCULAR | Status: DC
  Administered 2015-04-07 – 2015-04-11 (×11): 5000 [IU] via SUBCUTANEOUS
  Filled 2015-04-07 (×12): qty 1

## 2015-04-07 MED ORDER — CENTRUM SILVER PO TABS: 1.0000 | ORAL_TABLET | Freq: Every day | ORAL | Status: DC

## 2015-04-07 MED ORDER — SODIUM CHLORIDE 0.9 % IV BOLUS (SEPSIS)
500.0000 mL | Freq: Once | INTRAVENOUS | Status: AC
  Administered 2015-04-07: 500 mL via INTRAVENOUS

## 2015-04-07 MED ORDER — ASPIRIN 325 MG PO TABS
325.0000 mg | ORAL_TABLET | Freq: Every day | ORAL | Status: DC
  Administered 2015-04-07 – 2015-04-10 (×4): 325 mg via ORAL
  Filled 2015-04-07 (×5): qty 1

## 2015-04-07 MED ORDER — POTASSIUM CHLORIDE 10 MEQ/100ML IV SOLN
10.0000 meq | INTRAVENOUS | Status: AC
  Administered 2015-04-07: 10 meq via INTRAVENOUS
  Filled 2015-04-07: qty 100

## 2015-04-07 MED ORDER — TOPIRAMATE 100 MG PO TABS
100.0000 mg | ORAL_TABLET | Freq: Two times a day (BID) | ORAL | Status: DC
  Administered 2015-04-07 – 2015-04-11 (×8): 100 mg via ORAL
  Filled 2015-04-07 (×8): qty 1

## 2015-04-07 MED ORDER — OLOPATADINE HCL 0.1 % OP SOLN
1.0000 [drp] | Freq: Two times a day (BID) | OPHTHALMIC | Status: DC
  Administered 2015-04-07 – 2015-04-11 (×8): 1 [drp] via OPHTHALMIC
  Filled 2015-04-07: qty 5

## 2015-04-07 MED ORDER — SODIUM BICARBONATE 8.4 % IV SOLN
INTRAVENOUS | Status: DC
  Administered 2015-04-07 – 2015-04-08 (×4): via INTRAVENOUS
  Filled 2015-04-07 (×10): qty 150

## 2015-04-07 MED ORDER — LORAZEPAM 1 MG PO TABS
0.0000 mg | ORAL_TABLET | Freq: Two times a day (BID) | ORAL | Status: DC
  Filled 2015-04-07: qty 2

## 2015-04-07 MED ORDER — POTASSIUM CHLORIDE 10 MEQ/100ML IV SOLN
10.0000 meq | INTRAVENOUS | Status: AC
  Administered 2015-04-07: 10 meq via INTRAVENOUS
  Filled 2015-04-07 (×2): qty 100

## 2015-04-07 MED ORDER — ADULT MULTIVITAMIN W/MINERALS CH
1.0000 | ORAL_TABLET | Freq: Every day | ORAL | Status: DC
  Administered 2015-04-07 – 2015-04-10 (×4): 1 via ORAL
  Filled 2015-04-07 (×5): qty 1

## 2015-04-07 MED ORDER — POTASSIUM CHLORIDE CRYS ER 20 MEQ PO TBCR
40.0000 meq | EXTENDED_RELEASE_TABLET | ORAL | Status: AC
  Administered 2015-04-07 (×2): 40 meq via ORAL
  Filled 2015-04-07 (×2): qty 2

## 2015-04-07 MED ORDER — CYCLOSPORINE 0.05 % OP EMUL
1.0000 [drp] | Freq: Two times a day (BID) | OPHTHALMIC | Status: DC
  Administered 2015-04-08 – 2015-04-11 (×7): 1 [drp] via OPHTHALMIC
  Filled 2015-04-07 (×8): qty 1

## 2015-04-07 MED ORDER — LORAZEPAM 1 MG PO TABS: 1.0000 mg | ORAL_TABLET | Freq: Four times a day (QID) | ORAL | Status: AC | PRN

## 2015-04-07 MED ORDER — BUPROPION HCL ER (XL) 150 MG PO TB24
150.0000 mg | ORAL_TABLET | Freq: Every day | ORAL | Status: DC
  Administered 2015-04-08 – 2015-04-11 (×4): 150 mg via ORAL
  Filled 2015-04-07 (×4): qty 1

## 2015-04-07 MED ORDER — LORAZEPAM 1 MG PO TABS: 0.0000 mg | ORAL_TABLET | Freq: Four times a day (QID) | ORAL | Status: AC

## 2015-04-07 MED ORDER — CETYLPYRIDINIUM CHLORIDE 0.05 % MT LIQD
7.0000 mL | Freq: Two times a day (BID) | OROMUCOSAL | Status: DC
  Administered 2015-04-07 – 2015-04-11 (×6): 7 mL via OROMUCOSAL

## 2015-04-07 MED ORDER — ACETAMINOPHEN 325 MG PO TABS: 650.0000 mg | ORAL_TABLET | ORAL | Status: DC | PRN

## 2015-04-07 MED ORDER — PANTOPRAZOLE SODIUM 40 MG PO TBEC
40.0000 mg | DELAYED_RELEASE_TABLET | Freq: Every day | ORAL | Status: DC
  Administered 2015-04-08 – 2015-04-11 (×4): 40 mg via ORAL
  Filled 2015-04-07 (×5): qty 1

## 2015-04-07 MED ORDER — DULOXETINE HCL 30 MG PO CPEP: 30.0000 mg | ORAL_CAPSULE | Freq: Every day | ORAL | Status: DC

## 2015-04-07 MED ORDER — LORAZEPAM 2 MG/ML IJ SOLN: 1.0000 mg | Freq: Four times a day (QID) | INTRAMUSCULAR | Status: AC | PRN

## 2015-04-07 MED ORDER — SODIUM CHLORIDE 0.9 % IV BOLUS (SEPSIS)
250.0000 mL | Freq: Once | INTRAVENOUS | Status: AC
  Administered 2015-04-07: 250 mL via INTRAVENOUS

## 2015-04-07 MED ORDER — ACETAMINOPHEN 650 MG RE SUPP: 650.0000 mg | RECTAL | Status: DC | PRN

## 2015-04-07 MED ORDER — LINACLOTIDE 290 MCG PO CAPS
290.0000 ug | ORAL_CAPSULE | Freq: Every day | ORAL | Status: DC
  Administered 2015-04-08 – 2015-04-09 (×2): 290 ug via ORAL
  Filled 2015-04-07 (×3): qty 1

## 2015-04-07 MED ORDER — SUMATRIPTAN SUCCINATE 100 MG PO TABS
100.0000 mg | ORAL_TABLET | ORAL | Status: DC | PRN
  Filled 2015-04-07: qty 1

## 2015-04-07 MED ORDER — SODIUM CHLORIDE 0.9 % IV SOLN: INTRAVENOUS | Status: DC

## 2015-04-07 NOTE — ED Notes (Signed)
Spoke to Danielle Gaines pt can go to floor at 1400.Marland Kitchenklj

## 2015-04-07 NOTE — ED Notes (Signed)
Danielle Gaines got new blood for patient, Danielle Gaines verified blood and now sending it to lab.

## 2015-04-07 NOTE — ED Notes (Signed)
Pt transported to MRI 

## 2015-04-07 NOTE — ED Notes (Signed)
Attempted to start IV potassium and fluids but MRI needs to take the pt, and pt cannot be on a pump during testing.

## 2015-04-07 NOTE — ED Notes (Signed)
Patient has been having issues with dizziness, pt got dizzy and fell on Friday, hit her head, c/o headache, confusion, dizziness, short term memory issues. Pt having difficulties recalling year, recalling situation, pt appears confused. Patient asked to remember the word "dog," repeated word "dog" back to staff. In less than 5 minutes, pt asked to recall word and unable to. Family member states that prior to Friday patient had no memory or confusion issues, no dementia.

## 2015-04-07 NOTE — ED Notes (Signed)
PA at bedside.

## 2015-04-07 NOTE — ED Notes (Signed)
RN called 4W and notified secretary of delay in getting pt. To the floor.

## 2015-04-07 NOTE — Telephone Encounter (Signed)
Late documentation.  Pts husband walked in at 8 this morning without pt stating that he thought pt was having a stroke and is currently at home.  He stated his wife is a [employer] scientist and seen that her face was drooping yesterday.  Her speech is slurred.  He stated that his wife is refusing to go to ED or be seen here.  Instructed pt that he should call 911 and let his wife she know she needed to go to ED now.  Offered to call 911 for him.  He declined.  He stated he lived 3 minutes down the road.  He will go home and call 911.  Pt is currently in the ED

## 2015-04-07 NOTE — ED Provider Notes (Signed)
CSN: [id number]     Arrival date & time 04/07/15  0915 History   First MD Initiated Contact with Patient 04/07/15 [phone]     Chief Complaint  Patient presents with  . Altered Mental Status     (Consider location/radiation/quality/duration/timing/severity/associated sxs/prior Treatment) HPI  Danielle Gaines is a 74 y.o. female with past medical history significant for osteoporosis and migraines accompanied by husband complaining of increased confusion and difficulty ambulating, slurred speech onset 3 days ago. Patient had unwitnessed fall 4 days ago, she's not taking any anticoagulation. Prior to 4 days ago patient was having increasing shortness of breath and dyspnea on exertion, no known prior cardiac issues. Patient denies focal weakness but endorses a generalized weakness. She had an episode of emesis 2 days ago. Is eating and drinking less than normal. Denies fever, chills, cough, abdominal pain, chest pain, headache, change in vision difficulty speaking and a recent medication change: Her psychiatrist is trying to wean her off Xanax and giving her both Librium and Adderall, this change was 3 months ago. Patient self administers her own medication husband states that he's been helping her recently, she doesn't have a history of substance abuse.  PCP [place]: Rodena Goldmann  Past Medical History  Diagnosis Date  . Osteoporosis   . Migraine     migraines  . Depression   . Benzodiazepine dependence   . IBS (irritable bowel syndrome)   . Colon abnormality     didn't work right so part of it was removed at [facility]  . GERD (gastroesophageal reflux disease)    Past Surgical History  Procedure Laterality Date  . Appendectomy    . Cholecystectomy    . Abdominal hysterectomy    . Abdominal adhesion surgery    . Small intestine surgery    . Colon surgery    . Ovarian cyst surgery     Family History  Problem Relation Age of Onset  . Kidney disease Mother   . Heart disease Mother   .  Cancer Maternal Aunt     intestinal and liver cancer  . Stroke Maternal Grandmother   . Stroke    . Depression      everyone   History  Substance Use Topics  . Smoking status: Never Smoker   . Smokeless tobacco: Never Used  . Alcohol Use: No   OB History    No data available     Review of Systems  10 systems reviewed and found to be negative, except as noted in the HPI.   Allergies  Sulfonamide derivatives  Home Medications   Prior to Admission medications   Medication Sig Start Date End Date Taking? Authorizing Provider  alendronate (FOSAMAX) 70 MG tablet Take 1 tablet (70 mg total) by mouth once a week. 05/24/14  Yes Lucretia Kern, DO  BIOTIN PO Take by mouth daily.   Yes Historical Provider, MD  buPROPion (WELLBUTRIN XL) 300 MG 24 hr tablet Take 300 mg by mouth every morning. 03/24/15  Yes Historical Provider, MD  calcium carbonate (OS-CAL) 600 MG TABS tablet Take 600 mg by mouth 2 (two) times daily with a meal.   Yes Historical Provider, MD  chlordiazePOXIDE (LIBRIUM) 10 MG capsule Take 10 mg by mouth at bedtime.   Yes Historical Provider, MD  cholecalciferol (VITAMIN D) 400 UNITS TABS tablet Take 400 Units by mouth.   Yes Historical Provider, MD  cycloSPORINE (RESTASIS) 0.05 % ophthalmic emulsion Place 1 drop into both eyes 2 (two) times  daily.    Yes Historical Provider, MD  dexlansoprazole (DEXILANT) 60 MG capsule Take 60 mg by mouth daily.   Yes Historical Provider, MD  DULoxetine (CYMBALTA) 60 MG capsule Take 60 mg by mouth. 07/12/14  Yes Historical Provider, MD  hyoscyamine (LEVBID) 0.375 MG 12 hr tablet Take 1 tablet (0.375 mg total) by mouth 2 (two) times daily. Patient taking differently: Take 0.375 mg by mouth every 12 (twelve) hours as needed for cramping.  03/03/15  Yes Renato Shin, MD  Linaclotide Rolan Lipa) 290 MCG CAPS capsule Take 290 mcg by mouth daily.   Yes Historical Provider, MD  Multiple Vitamins-Minerals (CENTRUM SILVER PO) Take 1 tablet by mouth daily.     Yes Historical Provider, MD  Pancrelipase, Lip-Prot-Amyl, (ZENPEP PO) Take 3 capsules by mouth 3 (three) times daily after meals.    Yes Historical Provider, MD  PATADAY 0.2 % SOLN Place 1 drop into both eyes 2 (two) times daily as needed. Use one time a day, then again if needed for dry eyes 03/10/15  Yes Historical Provider, MD  Probiotic Product (ALIGN PO) Take by mouth daily.   Yes Historical Provider, MD  SUMAtriptan (IMITREX) 100 MG tablet Take 100 mg by mouth every 2 (two) hours as needed for migraine or headache. May repeat in 2 hours if headache persists or recurs.   Yes Historical Provider, MD  topiramate (TOPAMAX) 200 MG tablet Take 200 mg by mouth 2 (two) times daily.   Yes Historical Provider, MD   BP 114/81 mmHg  Pulse 110  Resp 16  SpO2 98% Physical Exam  Constitutional: She appears well-developed and well-nourished. No distress.  HENT:  Head: Normocephalic.  Eyes: Conjunctivae and EOM are normal.  Cardiovascular: Normal rate.   Pulmonary/Chest: Effort normal. No stridor.  Musculoskeletal: Normal range of motion.  Neurological: She is alert.  Alert but dozes off easily. Patient does have slurred speech, she is slumped to the right. Patient has slightly less nasolabial fold on the left and weaker eyebrow raise on the left as well. Uvula is midline, tongue extension midline, pupils are equal round and reactive to light. Strength is 3 out of 5 on bilateral upper and lower extremities. Finger to nose and heel to shin normal. On ambulation patient is unable to take more than a few steps while being supported. No pronator drift.   Psychiatric: She has a normal mood and affect.  Nursing note and vitals reviewed.   ED Course  Procedures (including critical care time) Labs Review Labs Reviewed  I-STAT CHEM 8, ED - Abnormal; Notable for the following:    Potassium 2.8 (*)    BUN 24 (*)    Creatinine, Ser 1.80 (*)    Glucose, Bld 146 (*)    Calcium, Ion 1.49 (*)    Hemoglobin  19.4 (*)    HCT 57.0 (*)    All other components within normal limits  CBG MONITORING, ED - Abnormal; Notable for the following:    Glucose-Capillary 135 (*)    All other components within normal limits  ETHANOL  PROTIME-INR  APTT  CBC  DIFFERENTIAL  COMPREHENSIVE METABOLIC PANEL  URINE RAPID DRUG SCREEN (HOSP PERFORMED)  URINALYSIS, ROUTINE W REFLEX MICROSCOPIC  MAGNESIUM  SODIUM, URINE, RANDOM  CREATININE, URINE, RANDOM  I-STAT TROPOININ, ED  I-STAT CG4 LACTIC ACID, ED    Imaging Review Ct Head Wo Contrast  04/07/2015   CLINICAL DATA:  Altered mental status.  EXAM: CT HEAD WITHOUT CONTRAST  TECHNIQUE: Contiguous axial images  were obtained from the base of the skull through the vertex without intravenous contrast.  COMPARISON:  01/11/2004  FINDINGS: Skull and Sinuses:Negative for fracture or destructive process. The mastoids, middle ears, and imaged paranasal sinuses are clear.  Orbits: Bilateral cataract resection.  No acute findings.  Brain: No evidence of acute infarction, hemorrhage, hydrocephalus, or mass lesion/mass effect. Decrease in cerebral volume since 2005, commensurate with aging.  IMPRESSION: Normal head CT for age.   Electronically Signed   By: Monte Fantasia M.D.   On: 04/07/2015 10:21   Dg Chest Port 1 View  04/07/2015   CLINICAL DATA:  Altered mental status  EXAM: PORTABLE CHEST - 1 VIEW  COMPARISON:  06/04/2014  FINDINGS: Cardiomediastinal silhouette is stable. No acute infiltrate or pleural effusion. No pulmonary edema. Mild hyperinflation. Stable degenerative changes thoracic spine.  IMPRESSION: No active disease.  Mild hyperinflation.   Electronically Signed   By: Lahoma Crocker M.D.   On: 04/07/2015 10:26     EKG Interpretation None     EKG: Sinus tachycardia at 102 bpm. Slightly prolonged QT at 588. Normal axis, nonspecific T-wave changes.  MDM   Final diagnoses:  Altered mental status  Facial droop    Filed Vitals:   04/07/15 1235 04/07/15 1237  04/07/15 1352 04/07/15 1434  BP: 105/60  108/48 118/42  Pulse: 99  97 97  Temp: 97.9 F (36.6 C) 97.9 F (36.6 C)  97.9 F (36.6 C)  TempSrc: Oral   Oral  Resp: 18  14 18   Height:    5\' 1"  (1.549 m)  Weight:    128 lb 12.8 oz (58.423 kg)  SpO2: 100%  100% 100%    Medications  potassium chloride 10 mEq in 100 mL IVPB (not administered)  sodium chloride 0.9 % bolus 500 mL (not administered)  sodium chloride 0.9 % bolus 500 mL (500 mLs Intravenous New Bag/Given 04/07/15 0950)    Danielle Gaines is a pleasant 74 y.o. female presenting with difficulty ambulating status post fall 3 days ago. Patient has left-sided facial droop with slurred speech, no significant dysmetria. Patient is unable to and bili independently. Head CT is negative. Patient has been found to have low potassium on i-STAT Chem-8, there is a delay in obtaining blood. Patient has been evaluated by [facility] hospitalist Dr. Sheran Fava who accepts admission.      Monico Blitz, PA-C 04/07/15 [place], DO 04/09/15 1314

## 2015-04-07 NOTE — H&P (Signed)
[facility] History and Physical  Danielle Gaines WSF:[id number] DOB: February 15, 1941 DOA: 04/07/2015  Referring physician:  Monico Blitz PCP:  Lucretia Kern., DO   Chief Complaint:  Falls, slurred speech  HPI:  The patient is a 74 y.o. year-old female with history of migraines, IBS, depression, chronic benzodiazepine use followed by psychiatry, osteoporosis who presents with recurrent falls, slurred speech.  The patient was last at their baseline health until about a month ago.  She lives at home with her husband and previously was independent for all ADLs and walked without an assistive device. For the last couple of weeks, she has felt like she is drunk and has had numerous falls. She states that both of her arms feel somewhat numb and weak, her left worse than her right. She has had several episodes where the room felt off balance and she has fallen. She denies losing consciousness. Her last fall was 3 days prior to admission and she fell and hit her head sharply on the edge of a chair. She denies loss of consciousness but since that time she has had even worse weakness of the left arm, slurred speech, and some confusion. She has intermittent chronic diarrhea which is unchanged. She also has intermittent chronic nausea with vomiting which is also unchanged. She denies recent fevers, chills, sinus congestion, sore throat, cough. She has chronic dysuria and has had tests for urinary tract infection which have been negative by her primary care doctor. She asked her primary care doctor to taper her off of her benzodiazepines, process which was started a couple months ago. She was started on Adderall during the day to keep her awake because she felt like she was oversedated. She has not been eating or drinking as much particularly over the last week.  In the emergency department, vital signs notable for pulse of 110, orthostatics positive.  Labs notable for hemoglobin 19.4, potassium 2.8, creatinine 1.8.   CT head demonstrated no acute change.  ECG pending.  She is being admitted for possible stroke, dehydration/acute kidney injury, electrlyte disturbance.    Review of Systems:  General:  Denies fevers, chills, weight loss or gain HEENT:  Denies changes to hearing and vision, rhinorrhea, sinus congestion, sore throat CV:  Denies chest pain and palpitations, lower extremity edema.  PULM:  Denies SOB, wheezing, cough.   GI:  Nausea with nonbilious, nonbloody vomit last night (chronic problem), water brown stool last night (chronic intermittent) GU:  Chronic dysuria, denies frequency, urgency ENDO:  Denies polyuria, polydipsia.   HEME:  Denies hematemesis, blood in stools, melena, abnormal bruising or bleeding.  LYMPH:  Denies lymphadenopathy.   MSK:  Denies arthralgias, myalgias.   DERM:  Denies skin rash or ulcer.   NEURO:  Per HPI  PSYCH:  Denies anxiety and depression.    Past Medical History  Diagnosis Date  . Osteoporosis   . Migraine     migraines  . Depression   . Benzodiazepine dependence   . IBS (irritable bowel syndrome)   . Colon abnormality     didn't work right so part of it was removed at [facility]  . GERD (gastroesophageal reflux disease)    Past Surgical History  Procedure Laterality Date  . Appendectomy    . Cholecystectomy    . Abdominal hysterectomy    . Abdominal adhesion surgery    . Small intestine surgery    . Colon surgery    . Ovarian cyst surgery     Social  History:  reports that she has never smoked. She has never used smokeless tobacco. She reports that she does not drink alcohol or use illicit drugs. Lives with her husband and normally independent of ADLs  Allergies  Allergen Reactions  . Sulfonamide Derivatives Swelling    Family History  Problem Relation Age of Onset  . Kidney disease Mother   . Heart disease Mother   . Cancer Maternal Aunt     intestinal and liver cancer  . Stroke Maternal Grandmother   . Stroke    . Depression       everyone     Prior to Admission medications   Medication Sig Start Date End Date Taking? Authorizing Provider  alendronate (FOSAMAX) 70 MG tablet Take 1 tablet (70 mg total) by mouth once a week. 05/24/14  Yes Lucretia Kern, DO  amphetamine-dextroamphetamine (ADDERALL XR) 20 MG 24 hr capsule Take 40 mg by mouth every morning. 03/28/15  Yes Historical Provider, MD  BIOTIN PO Take by mouth daily.   Yes Historical Provider, MD  buPROPion (WELLBUTRIN XL) 300 MG 24 hr tablet Take 300 mg by mouth every morning. 03/24/15  Yes Historical Provider, MD  calcium carbonate (OS-CAL) 600 MG TABS tablet Take 600 mg by mouth 2 (two) times daily with a meal.   Yes Historical Provider, MD  chlordiazePOXIDE (LIBRIUM) 10 MG capsule Take 10 mg by mouth at bedtime.   Yes Historical Provider, MD  cholecalciferol (VITAMIN D) 400 UNITS TABS tablet Take 400 Units by mouth.   Yes Historical Provider, MD  cycloSPORINE (RESTASIS) 0.05 % ophthalmic emulsion Place 1 drop into both eyes 2 (two) times daily.    Yes Historical Provider, MD  dexlansoprazole (DEXILANT) 60 MG capsule Take 60 mg by mouth daily.   Yes Historical Provider, MD  DULoxetine (CYMBALTA) 60 MG capsule Take 60 mg by mouth. 07/12/14  Yes Historical Provider, MD  hyoscyamine (LEVBID) 0.375 MG 12 hr tablet Take 1 tablet (0.375 mg total) by mouth 2 (two) times daily. Patient taking differently: Take 0.375 mg by mouth every 12 (twelve) hours as needed for cramping.  03/03/15  Yes Renato Shin, MD  Linaclotide Rolan Lipa) 290 MCG CAPS capsule Take 290 mcg by mouth daily.   Yes Historical Provider, MD  magnesium gluconate (MAGONATE) 500 MG tablet Take 500 mg by mouth 2 (two) times daily.   Yes Historical Provider, MD  Multiple Vitamins-Minerals (CENTRUM SILVER PO) Take 1 tablet by mouth daily.    Yes Historical Provider, MD  Omega-3 Fatty Acids (FISH OIL) 1000 MG CPDR Take by mouth.   Yes Historical Provider, MD  Pancrelipase, Lip-Prot-Amyl, (ZENPEP PO) Take 3 capsules  by mouth 3 (three) times daily after meals.    Yes Historical Provider, MD  PATADAY 0.2 % SOLN Place 1 drop into both eyes 2 (two) times daily as needed. Use one time a day, then again if needed for dry eyes 03/10/15  Yes Historical Provider, MD  Probiotic Product (ALIGN PO) Take by mouth daily.   Yes Historical Provider, MD  SUMAtriptan (IMITREX) 100 MG tablet Take 100 mg by mouth every 2 (two) hours as needed for migraine or headache. May repeat in 2 hours if headache persists or recurs.   Yes Historical Provider, MD  topiramate (TOPAMAX) 200 MG tablet Take 200 mg by mouth 2 (two) times daily.   Yes Historical Provider, MD  vitamin E 100 UNIT capsule Take by mouth daily.   Yes Historical Provider, MD   Physical Exam: Filed Vitals:  04/07/15 0924  BP: 114/81  Pulse: 110  Resp: 16  SpO2: 98%     General:  Thin female, NAD  Eyes:  PERRL, anicteric, bilateral eye-injecion  ENT:  Nares clear.  OP clear, non-erythematous without plaques or exudates.  MMM.  Softening of the bilateral nasolabial folds suggestive of botox (patient denies), worse on the left side  Neck:  Supple without TM or JVD.    Lymph:  No cervical, supraclavicular, or submandibular LAD.  Cardiovascular:  RRR, normal S1, S2, without m/r/g.  2+ pulses, warm extremities  Respiratory:  CTA bilaterally without increased WOB.  Abdomen:  NABS.  Soft, ND/NT.    Skin:  No rashes or focal lesions.  Musculoskeletal:  Normal bulk and tone.  No LE edema.  Psychiatric:  A & O x 4.  Slow to answer some questions, needs some redirection  Neurologic:  PERRL, EOMI, mild left facial droop and some mild numbness on the left side.  4/5 strength LUE and other extremities are 5/5 strength.  Decreased sensation to light touch on both feet.  Mild dysmetria of left hand and bilateral legs.    Labs on Admission:  Basic Metabolic Panel:  Recent Labs Lab 04/07/15 0959  NA 139  K 2.8*  CL 109  GLUCOSE 146*  BUN 24*  CREATININE  1.80*   Liver Function Tests: No results for input(s): AST, ALT, ALKPHOS, BILITOT, PROT, ALBUMIN in the last 168 hours. No results for input(s): LIPASE, AMYLASE in the last 168 hours. No results for input(s): AMMONIA in the last 168 hours. CBC:  Recent Labs Lab 04/07/15 0959  HGB 19.4*  HCT 57.0*   Cardiac Enzymes: No results for input(s): CKTOTAL, CKMB, CKMBINDEX, TROPONINI in the last 168 hours.  BNP (last 3 results) No results for input(s): BNP in the last 8760 hours.  ProBNP (last 3 results) No results for input(s): PROBNP in the last 8760 hours.  CBG:  Recent Labs Lab 04/07/15 1017  GLUCAP 135*    Radiological Exams on Admission: Ct Head Wo Contrast  04/07/2015   CLINICAL DATA:  Altered mental status.  EXAM: CT HEAD WITHOUT CONTRAST  TECHNIQUE: Contiguous axial images were obtained from the base of the skull through the vertex without intravenous contrast.  COMPARISON:  01/11/2004  FINDINGS: Skull and Sinuses:Negative for fracture or destructive process. The mastoids, middle ears, and imaged paranasal sinuses are clear.  Orbits: Bilateral cataract resection.  No acute findings.  Brain: No evidence of acute infarction, hemorrhage, hydrocephalus, or mass lesion/mass effect. Decrease in cerebral volume since 2005, commensurate with aging.  IMPRESSION: Normal head CT for age.   Electronically Signed   By: Monte Fantasia M.D.   On: 04/07/2015 10:21   Dg Chest Port 1 View  04/07/2015   CLINICAL DATA:  Altered mental status  EXAM: PORTABLE CHEST - 1 VIEW  COMPARISON:  06/04/2014  FINDINGS: Cardiomediastinal silhouette is stable. No acute infiltrate or pleural effusion. No pulmonary edema. Mild hyperinflation. Stable degenerative changes thoracic spine.  IMPRESSION: No active disease.  Mild hyperinflation.   Electronically Signed   By: Lahoma Crocker M.D.   On: 04/07/2015 10:26    EKG:  pending  Assessment/Plan Active Problems:   Migraine - followed by Dr. Sima Matas in Neurology    Anxiety and depression - managed at [facility]   GERD (gastroesophageal reflux disease)   Osteoporosis   Slurred speech   Weakness of left upper extremity   Orthostatic hypotension   AKI (acute kidney injury)  Hypomagnesemia   Stroke  ---  Possible stroke with mild persistent facial droop and slurred speech, however, I suspect that she is actually suffering from dehydration which caused AKI and now essentially medication overdose due to lingering effects of her medications.  Head CT negative.   -  Admit to telemetry  -  q4h neuro checks -  MRI brain/MRA head -  Carotid duplex -  ECHO -  Aspirin 325mg  daily -  Lipid panel  -  Hemoglobin A1c -  PT/OT/SLP  -  Neurology consult if MRI demonstrates stroke -  Decrease or hold sedating medications:  Hold topamax and resume at half dose in a day -  CIWA for benzo withdrawal -  D/c adderall  Depression/anxiety, halve cymbalta and wellbutrin doses  Chronic benzodiazepine use, trying to wean herself off benzos -  Start CIWA protocol  Migraine headaches, stable, continue topiramate as above and prn imitrex  IBS, stable, continue pancrelipase, linzess, hyoscyamine  GERD, stable, continue PPI  Osteoporosis, stable, hold alendronate  Hyperglycemia -  Check A1c -  Daily CBG and if persistently > 160, start SSI  Acute kidney injury, hypokalemia and elevated hemoglobin suggest dehydration.  Orthostatics positive -  Start IV fluids -  F/u CMP, magnesium, and CBC -  Oral and IV potassium repletion -  Repeat CBC and BMP in a.m. -  Urinalysis and FENa -  Low suspicion for urinary retention so will defer renal ultrasound at this time  Diet:  NPO pending strict swallow screen Access:  PIV IVF:  Yes Proph:  Heparin  Code Status: DO NOT RESUSCITATE/DO NOT INTUBATE Family Communication: Patient and her husband Disposition Plan: Admit to telemetry  Time spent: 60 min Janece Canterbury [facility] Pager [phone]  If  7PM-7AM, please contact night-coverage [email or web address] Password [email or web address] 04/07/2015, 11:30 AM

## 2015-04-08 ENCOUNTER — Observation Stay (HOSPITAL_COMMUNITY): Payer: BLUE CROSS/BLUE SHIELD

## 2015-04-08 DIAGNOSIS — F418 Other specified anxiety disorders: Secondary | ICD-10-CM | POA: Diagnosis not present

## 2015-04-08 DIAGNOSIS — Z79899 Other long term (current) drug therapy: Secondary | ICD-10-CM | POA: Diagnosis not present

## 2015-04-08 DIAGNOSIS — G43909 Migraine, unspecified, not intractable, without status migrainosus: Secondary | ICD-10-CM | POA: Diagnosis present

## 2015-04-08 DIAGNOSIS — I6789 Other cerebrovascular disease: Secondary | ICD-10-CM | POA: Diagnosis not present

## 2015-04-08 DIAGNOSIS — K219 Gastro-esophageal reflux disease without esophagitis: Secondary | ICD-10-CM

## 2015-04-08 DIAGNOSIS — E860 Dehydration: Secondary | ICD-10-CM | POA: Diagnosis present

## 2015-04-08 DIAGNOSIS — F332 Major depressive disorder, recurrent severe without psychotic features: Secondary | ICD-10-CM | POA: Diagnosis present

## 2015-04-08 DIAGNOSIS — R4781 Slurred speech: Secondary | ICD-10-CM

## 2015-04-08 DIAGNOSIS — I951 Orthostatic hypotension: Secondary | ICD-10-CM | POA: Diagnosis present

## 2015-04-08 DIAGNOSIS — R296 Repeated falls: Secondary | ICD-10-CM | POA: Diagnosis present

## 2015-04-08 DIAGNOSIS — R739 Hyperglycemia, unspecified: Secondary | ICD-10-CM | POA: Diagnosis present

## 2015-04-08 DIAGNOSIS — M810 Age-related osteoporosis without current pathological fracture: Secondary | ICD-10-CM | POA: Diagnosis present

## 2015-04-08 DIAGNOSIS — F419 Anxiety disorder, unspecified: Secondary | ICD-10-CM | POA: Diagnosis present

## 2015-04-08 DIAGNOSIS — E876 Hypokalemia: Secondary | ICD-10-CM | POA: Diagnosis present

## 2015-04-08 DIAGNOSIS — K589 Irritable bowel syndrome without diarrhea: Secondary | ICD-10-CM | POA: Diagnosis present

## 2015-04-08 DIAGNOSIS — N179 Acute kidney failure, unspecified: Secondary | ICD-10-CM | POA: Diagnosis present

## 2015-04-08 DIAGNOSIS — Z66 Do not resuscitate: Secondary | ICD-10-CM | POA: Diagnosis present

## 2015-04-08 LAB — CBC
HCT: 33.4 % — ABNORMAL LOW (ref 36.0–46.0)
Hemoglobin: 11.9 g/dL — ABNORMAL LOW (ref 12.0–15.0)
MCH: 32.2 pg (ref 26.0–34.0)
MCHC: 35.6 g/dL (ref 30.0–36.0)
MCV: 90.5 fL (ref 78.0–100.0)
Platelets: 219 10*3/uL (ref 150–400)
RBC: 3.69 MIL/uL — ABNORMAL LOW (ref 3.87–5.11)
RDW: 12.8 % (ref 11.5–15.5)
WBC: 6.4 10*3/uL (ref 4.0–10.5)

## 2015-04-08 LAB — BASIC METABOLIC PANEL
Anion gap: 8 (ref 5–15)
BUN: 16 mg/dL (ref 6–20)
CO2: 21 mmol/L — ABNORMAL LOW (ref 22–32)
Calcium: 8.5 mg/dL — ABNORMAL LOW (ref 8.9–10.3)
Chloride: 110 mmol/L (ref 101–111)
Creatinine, Ser: 1.18 mg/dL — ABNORMAL HIGH (ref 0.44–1.00)
GFR calc Af Amer: 52 mL/min — ABNORMAL LOW (ref >60)
GFR calc non Af Amer: 45 mL/min — ABNORMAL LOW (ref >60)
Glucose, Bld: 135 mg/dL — ABNORMAL HIGH (ref 70–99)
Potassium: 3.3 mmol/L — ABNORMAL LOW (ref 3.5–5.1)
Sodium: 139 mmol/L (ref 135–145)

## 2015-04-08 LAB — LIPID PANEL
Cholesterol: 127 mg/dL (ref 0–200)
HDL: 39 mg/dL — ABNORMAL LOW (ref >40)
LDL Cholesterol: 61 mg/dL (ref 0–99)
Total CHOL/HDL Ratio: 3.3 RATIO
Triglycerides: 136 mg/dL (ref <150)
VLDL: 27 mg/dL (ref 0–40)

## 2015-04-08 LAB — GLUCOSE, CAPILLARY
Glucose-Capillary: 105 mg/dL — ABNORMAL HIGH (ref 70–99)
Glucose-Capillary: 136 mg/dL — ABNORMAL HIGH (ref 70–99)

## 2015-04-08 MED ORDER — POTASSIUM CHLORIDE CRYS ER 20 MEQ PO TBCR
40.0000 meq | EXTENDED_RELEASE_TABLET | Freq: Once | ORAL | Status: AC
Start: 2015-04-08 — End: 2015-04-08
  Administered 2015-04-08: 40 meq via ORAL
  Filled 2015-04-08: qty 2

## 2015-04-08 NOTE — Evaluation (Signed)
Occupational Therapy Evaluation Patient Details Name: Danielle Gaines MRN: [id number] DOB: [birth year] Today's Date: 04/08/2015    History of Present Illness Pt was admitted for falls and slurred speech.  She has a h/o recurrent falls, chronic benzodiazapine use, migraines, and depression   Clinical Impression   This 74 year old female was admitted for the above.  She will benefit from skilled OT to increase safety and independence with adls.  Pt needs up to mod A for ADLs due to decreased balance/ability to maintain posture.  She demonstrated decreased balance in sitting also, and sometimes could self correct with cueing and sometimes needed support.  Goals  In acute are for supervision to min guard A.  Unsure about PLOF--per chart, pt had decline in function over past month    Follow Up Recommendations  SNF (depending upon progress)    Equipment Recommendations   (pt has 3:1 commode)    Recommendations for Other Services       Precautions / Restrictions Precautions Precautions: Fall Precaution Comments: pt has a tendency to lean posteriorly in sitting and standing:  sometimes corrects with cueing Restrictions Weight Bearing Restrictions: No      Mobility Bed Mobility Overal bed mobility: Modified Independent (used rail, extra time)                Transfers Overall transfer level: Needs assistance Equipment used: Rolling walker (2 wheeled);1 person hand held assist Transfers: Sit to/from [facility] Sit to Stand: Min assist (at times, mod to sustain) Stand pivot transfers: Min assist       General transfer comment: used hand held assist for ADL and RW for SPT to 3:1    Balance Overall balance assessment: Needs assistance;History of Falls Sitting-balance support: Feet supported Sitting balance-Leahy Scale: Poor (sustained at fair level sometimes:  at least min guard for safety)                                      ADL Overall  ADL's : Needs assistance/impaired     Grooming: Wash/dry hands;Wash/dry face;Sitting;Minimal assistance   Upper Body Bathing: Set up;Sitting;Minimal assitance   Lower Body Bathing: Moderate assistance;Sit to/from stand   Upper Body Dressing : Minimal assistance;Sitting   Lower Body Dressing: Moderate assistance;Sit to/from stand   Toilet Transfer: Minimal assistance;BSC;Stand-pivot;RW   Toileting- Clothing Manipulation and Hygiene: Minimal assistance;Sit to/from stand         General ADL Comments: Pt needs the ADL assistance for balance as she has a tendency to lean posteriorly. this motion occurs slowly, and she can sometimes correct it with cues.  Performed ADL and toilet transfer, then ultrasound came for test.  Would like to look further at vestibular--may be part of picture.  Supine BP 87/51. Sitting 103/49     Vision  not tested   Perception     Praxis      Pertinent Vitals/Pain Pain Assessment: No/denies pain     Hand Dominance     Extremity/Trunk Assessment Upper Extremity Assessment Upper Extremity Assessment:  (not assessed:  ultrasound arrived; )       Cervical / Trunk Assessment Cervical / Trunk Assessment:  (able to turn head, moves slowly)   Communication Communication Communication: Expressive difficulties (extra time)   Cognition Arousal/Alertness: Awake/alert Behavior During Therapy: WFL for tasks assessed/performed Overall Cognitive Status: No family/caregiver present to determine baseline cognitive functioning  General Comments: information about dizziness conflicting.  At first she said she feels like she is passing out, later she described spinning, which only happens when she is standing..  Able to feel lean, but did not initiate correcting without cues   General Comments       Exercises       Shoulder Instructions      Home Living Family/patient expects to be discharged to:: Private residence Living  Arrangements: Spouse/significant other                           Home Equipment: Bedside commode          Prior Functioning/Environment          Comments: does not use AD. One month h/o decline.  Falls, hit head once.      OT Diagnosis: Generalized weakness   OT Problem List: Decreased strength;Decreased activity tolerance;Impaired balance (sitting and/or standing);Decreased cognition;Decreased safety awareness;Decreased knowledge of use of DME or AE   OT Treatment/Interventions: Self-care/ADL training;DME and/or AE instruction;Patient/family education;Balance training;Therapeutic activities    OT Goals(Current goals can be found in the care plan section) Acute Rehab OT Goals Patient Stated Goal: get back to being independent OT Goal Formulation: With patient Time For Goal Achievement: 04/22/15 Potential to Achieve Goals: Good ADL Goals Pt Will Perform Grooming: with min guard assist;standing Pt Will Transfer to Toilet: with min guard assist;ambulating;bedside commode Pt Will Perform Toileting - Clothing Manipulation and hygiene: with min guard assist;sit to/from stand Additional ADL Goal #1: pt will perform UB adls with set up/supervision in unsupported sitting Additional ADL Goal #2: pt will perform LB adls at min guard level, sit to stand  OT Frequency: Min 2X/week   Barriers to D/C:            Co-evaluation              End of Session    Activity Tolerance: Patient tolerated treatment well Patient left: in bed;with call bell/phone within reach;with bed alarm set   Time: 9924-2683 OT Time Calculation (min): 33 min Charges:  OT General Charges $OT Visit: 1 Procedure OT Evaluation $Initial OT Evaluation Tier I: 1 Procedure OT Treatments $Self Care/Home Management : 8-22 mins G-Codes: OT G-codes **NOT FOR INPATIENT CLASS** Functional Assessment Tool Used: clinical observation Functional Limitation: Self care Self Care Current Status (M1962):  At least 40 percent but less than 60 percent impaired, limited or restricted Self Care Goal Status (I2979): At least 1 percent but less than 20 percent impaired, limited or restricted  [facility] 04/08/2015, 9:19 AM  Lesle Chris, OTR/L [phone] 04/08/2015

## 2015-04-08 NOTE — Progress Notes (Signed)
PT Cancellation Note  Patient Details Name: Danielle Gaines MRN: [id number] DOB: January 28, 1941   Cancelled Treatment:    Reason Eval/Treat Not Completed: nursing in with pt at this time. will check back later today to perform PT eval.    Weston Anna, MPT Pager: [phone]

## 2015-04-08 NOTE — Progress Notes (Signed)
Writer asked pt if she felt the urge to urinate, pt stated she did not. Bladder scan revealed >850 ml in bladder. Pt up to bedside commode and stated she still could not urinate at this time. On call NP, K. Schorr, notified and gave an order for an in and out cath. Will continue to monitor pt closely. Carnella Guadalajara I

## 2015-04-08 NOTE — Progress Notes (Signed)
*  PRELIMINARY RESULTS* Vascular Ultrasound Carotid Duplex (Doppler) has been completed.  Preliminary findings: Bilateral:  1-39% ICA stenosis.  Vertebral artery flow is antegrade.      Landry Mellow, RDMS, RVT  04/08/2015, 8:57 AM

## 2015-04-08 NOTE — Progress Notes (Signed)
  Echocardiogram 2D Echocardiogram has been performed.  Donata Clay 04/08/2015, 1:59 PM

## 2015-04-08 NOTE — Evaluation (Signed)
Physical Therapy Evaluation Patient Details Name: Danielle Gaines MRN: [id number] DOB: September 10, 1941 Today's Date: 04/08/2015   History of Present Illness  Pt was admitted for falls and slurred speech.  She has a h/o recurrent falls, chronic benzodiazapine use, migraines, and depression  Clinical Impression  Patient tearful, requires 1 person for safe ambulation. Patient will benefit from PT to address problems listed in note below.    Follow Up Recommendations SNF;Supervision/Assistance - 24 hour (depending on progress, no other family available)    Equipment Recommendations  Rolling walker with 5" wheels    Recommendations for Other Services       Precautions / Restrictions Precautions Precautions: Fall Precaution Comments: pt has a tendency to lean posteriorly and to the Left  in sitting and standing:  sometimes corrects with cueing      Mobility  Bed Mobility Overal bed mobility: Modified Independent                Transfers Overall transfer level: Needs assistance Equipment used: Rolling walker (2 wheeled) Transfers: Sit to/from Stand Sit to Stand: Min assist         General transfer comment: cues to  stand  near midline, listing to L initially  Ambulation/Gait Ambulation/Gait assistance: Min assist;Mod assist Ambulation Distance (Feet): 90 Feet Assistive device: Rolling walker (2 wheeled) Gait Pattern/deviations: Step-to pattern;Step-through pattern;Decreased stride length;Drifts right/left     General Gait Details: slow cadence,  without RW, HHA x 20' x 2 with mod assist, listing to L, improved with RW/, cues to look ahead,  Stairs            Wheelchair Mobility    Modified Rankin (Stroke Patients Only)       Balance Overall balance assessment: History of Falls;Needs assistance Sitting-balance support: Feet supported;No upper extremity supported Sitting balance-Leahy Scale: Poor   Postural control: Left lateral lean Standing balance  support: During functional activity;No upper extremity supported;Single extremity supported Standing balance-Leahy Scale: Poor                               Pertinent Vitals/Pain Pain Assessment: No/denies pain    Home Living Family/patient expects to be discharged to:: Private residence Living Arrangements: Spouse/significant other Available Help at Discharge: Family;Available PRN/intermittently Type of Home: House Home Access: Stairs to enter   [employer] of Steps: 3-4 Home Layout: Two level Home Equipment: Bedside commode Additional Comments: spouse works per patient    Prior Function Level of Independence: Independent         Comments: does not use AD. One month h/o decline.  Falls, hit head once.       Hand Dominance        Extremity/Trunk Assessment               Lower Extremity Assessment: Generalized weakness      Cervical / Trunk Assessment: Normal  Communication   Communication: Expressive difficulties  Cognition Arousal/Alertness: Awake/alert Behavior During Therapy: WFL for tasks assessed/performed Overall Cognitive Status: No family/caregiver present to determine baseline cognitive functioning Area of Impairment: Orientation;Safety/judgement Orientation Level: Time             General Comments: stated 9-10 of May.,     General Comments      Exercises        Assessment/Plan    PT Assessment Patient needs continued PT services  PT Diagnosis Difficulty walking;Abnormality of gait;Generalized weakness;Altered mental status   PT  Problem List Decreased strength;Decreased activity tolerance;Decreased balance;Decreased mobility;Decreased knowledge of precautions;Decreased safety awareness;Decreased knowledge of use of DME;Decreased cognition  PT Treatment Interventions DME instruction;Gait training;Functional mobility training;Stair training;Therapeutic activities;Therapeutic exercise;Patient/family education    PT Goals (Current goals can be found in the Care Plan section) Acute Rehab PT Goals Patient Stated Goal: get back to being independent PT Goal Formulation: With patient Time For Goal Achievement: 04/22/15 Potential to Achieve Goals: Good    Frequency Min 3X/week   Barriers to discharge Decreased caregiver support      Co-evaluation               End of Session Equipment Utilized During Treatment: Gait belt Activity Tolerance: Patient tolerated treatment well Patient left: in bed;with call bell/phone within reach;with bed alarm set Nurse Communication: Mobility status         Time: 3149-7026 PT Time Calculation (min) (ACUTE ONLY): 25 min   Charges:   PT Evaluation $Initial PT Evaluation Tier I: 1 Procedure PT Treatments $Gait Training: 8-22 mins   PT G Codes:        Claretha Cooper 04/08/2015, 5:19 PM Tresa Endo PT [phone]

## 2015-04-08 NOTE — Clinical Social Work Placement (Signed)
CSW provided patient with SNF bed offers, though patient is leaning towards returning home with husband at discharge. Patient to discuss it with her husband before making a decision. CSW to follow-up in the morning.     Raynaldo Opitz, [facility] Clinical Social Worker cell #: [phone]    CLINICAL SOCIAL WORK PLACEMENT  NOTE  Date:  04/08/2015  Patient Details  Name: Danielle Gaines MRN: [id number] Date of Birth: 07/07/1941  Clinical Social Work is seeking post-discharge placement for this patient at the [place] level of care (*CSW will initial, date and re-position this form in  chart as items are completed):  Yes   Patient/family provided with [place] Work Department's list of facilities offering this level of care within the geographic area requested by the patient (or if unable, by the patient's family).  Yes   Patient/family informed of their freedom to choose among providers that offer the needed level of care, that participate in Medicare, Medicaid or managed care program needed by the patient, have an available bed and are willing to accept the patient.  Yes   Patient/family informed of [place]'s ownership interest in [facility] and [facility], as well as of the fact that they are under no obligation to receive care at these facilities.  PASRR submitted to EDS on 04/08/15     PASRR number received on 04/08/15     Existing PASRR number confirmed on       FL2 transmitted to all facilities in geographic area requested by pt/family on 04/08/15     FL2 transmitted to all facilities within larger geographic area on       Patient informed that his/her managed care company has contracts with or will negotiate with certain facilities, including the following:        Yes   Patient/family informed of bed offers received.  Patient chooses bed at       Physician recommends and patient chooses bed at       Patient to be transferred to   on  .  Patient to be transferred to facility by       Patient family notified on   of transfer.  Name of family member notified:        PHYSICIAN       Additional Comment:    _______________________________________________ Standley Brooking, LCSW 04/08/2015, 3:56 PM

## 2015-04-08 NOTE — Clinical Social Work Note (Signed)
Clinical Social Work Assessment  Patient Details  Name: Danielle Gaines MRN: [id number] Date of Birth: 03/10/1941  Date of referral:  04/08/15               Reason for consult:  Facility Placement                Permission sought to share information with:  Facility Art therapist granted to share information::  Yes, Verbal Permission Granted  Name::        Agency::     Relationship::     Contact Information:     Housing/Transportation Living arrangements for the past 2 months:  Single Family Home Source of Information:  Patient Patient Interpreter Needed:  None Criminal Activity/Legal Involvement Pertinent to Current Situation/Hospitalization:    Significant Relationships:  Spouse Lives with:  Spouse Do you feel safe going back to the place where you live?  Yes Need for family participation in patient care:  Yes (Comment)  Care giving concerns:  CSW reviewed OT evaluation recommending SNF (depending on progress).    Social Worker assessment / plan:  CSW spoke with patient & friend at bedside re: discharge planning.   Employment status:    Insurance information:  Managed Care PT Recommendations:  Not assessed at this time Information / Referral to community resources:  [place]  Patient/Family's Response to care:  Patient states that she would prefer to return home with her husband, but would like to discuss it with her husband before making a decision. Patient's husband to visit this evening & then again in the morning. CSW asked that patient call CSW when husband is here in the morning to discuss discharge planning further.   Patient/Family's Understanding of and Emotional Response to Diagnosis, Current Treatment, and Prognosis:  Patient had a friend visiting and did not wish to talk about diagnosis at this time.   Emotional Assessment Appearance:    Attitude/Demeanor/Rapport:    Affect (typically observed):  Pleasant Orientation:   Oriented to Self, Oriented to Place, Oriented to  Time, Oriented to Situation Alcohol / Substance use:    Psych involvement (Current and /or in the community):  No (Comment)  Discharge Needs  Concerns to be addressed:    Readmission within the last 30 days:    Current discharge risk:    Barriers to Discharge:      Standley Brooking, LCSW 04/08/2015, 3:52 PM

## 2015-04-08 NOTE — Progress Notes (Signed)
BP's am BP 92/50 Manual. Also bladder scan revealed 351 in bladder. Pt denies urge to urinate at this time. On call NP, K. Schorr, notified.  Will continue to monitor. Carnella Guadalajara I

## 2015-04-08 NOTE — Progress Notes (Signed)
Patient ID: Danielle Gaines, female   DOB: [birth year], 74 y.o.   MRN: [id number]  [facility] PROGRESS NOTE  Danielle Gaines PFX:[id number] DOB: 26-Jan-1941 DOA: 04/07/2015 PCP: Lucretia Kern., DO   Brief narrative:    74 y.o. year-old female with history of migraines, IBS, depression, chronic benzodiazepine use followed by psychiatry, osteoporosis who presented with recurrent falls, slurred speech. The patient was last at her baseline health until about a month ago. She lives at home with her husband and previously was independent for all ADLs and walked without an assistive device. For the last couple of weeks, she has felt like she is drunk and has had numerous falls. She states that both of her arms feel somewhat numb and weak, her left worse than her right. She has had several episodes where the room felt off balance and she has fallen. Her last fall was 3 days prior to admission and she fell and hit her head sharply on the edge of a chair. She denies loss of consciousness but since that time she has had even worse weakness of the left arm, slurred speech, and some confusion. She has intermittent chronic diarrhea which is unchanged. She also has intermittent chronic nausea with vomiting which is also unchanged. She denied recent fevers, chills, sinus congestion, sore throat, cough. She has chronic dysuria and has had tests for urinary tract infection which have been negative by her primary care doctor. She asked her primary care doctor to taper her off of her benzodiazepines, process which was started a couple months ago. She was started on Adderall during the day to keep her awake because she felt like she was oversedated. She has not been eating or drinking as much particularly over the last week.  Assessment/Plan:    Recurrent falls, slurred speech - Certainly worrisome for underlying CVA, please note that CT scan and MRI of the brain with no signs of acute stroke - No significant change in  neurological status since admission, slurred speech still present but per patient this has been improving over the past 24 hours - Patient has been on sedating medications in the past which could have certainly contributed to episodes of fall - Her medical regimen currently includes Wellbutrin, Cymbalta, Librium, Topamax, adderall  - Carotid duplex and ECHO pending  - Aspirin 325mg  daily - Lipid panel pending  - PT/OT/SLP requested, pt may need to go to SNF if she agrees  - Decrease or hold sedating medications: Hold topamax and resume at half dose in a day - CIWA for benzo withdrawal  Depression/anxiety - halved cymbalta and wellbutrin doses, will see if this helps with her balance and alertness   Chronic benzodiazepine use - trying to wean herself off benzos -continue CIWA protocol  Migraine headaches - stable, continue prn imitrex  IBS - stable, continue pancrelipase, linzess, hyoscyamine  GERD - stable, continue PPI  Osteoporosis - stable, hold alendronate  Hyperglycemia - A1c pending  - continue SSI   Acute kidney injury, hypokalemia and elevated hemoglobin suggest dehydration. Orthostatics positive - responding to IVF, Cr is trending down from 1.8 --> 1.18 - Oral and IV potassium repletion - Repeat CBC and BMP in a.m.  DVT prophylaxis - Heparin SQ  Code Status: DNR Family Communication:  plan of care discussed with the patient and husband at bedside Disposition Plan: Barrier to discharge - still requiring IVF, may need SNF if pt and husband agree   IV access:  Peripheral IV  Procedures and  diagnostic studies:    Ct Head Wo Contrast  2015-04-27  Normal head CT for age.     Mr Danielle Gaines Wo Contrast  04/27/2015   Normal MRI of the brain for a person of this age.  No acute insult.  Normal intracranial MR angiography of the large and medium size vessels.     al MRI of the brain for a person of this age.  No acute insult.  Normal intracranial MR angiography  of the large and medium size vessels.   Electronically Signed   By: Nelson Chimes M.D.   On: Apr 27, 2015 12:28   Dg Chest Port 1 View  04-27-2015   No active disease.  Mild hyperinflation.    Medical Consultants:  None   Other Consultants:  PT/OT/SLP  IAnti-Infectives:   None   Faye Ramsay, MD  [facility] Pager [phone]  If 7PM-7AM, please contact night-coverage [email or web address] Password [email or web address] 04/08/2015, 11:49 AM  HPI/Subjective: No events overnight. Still feels week.  Objective: Filed Vitals:   04/08/15 0029 04/08/15 0541 04/08/15 0550 04/08/15 0930  BP: 94/52 84/53 92/50  94/48  Pulse: 80 80  88  Temp: 98.2 F (36.8 C) 97.6 F (36.4 C)  97.4 F (36.3 C)  TempSrc: Oral Oral  Oral  Resp: 18 16  18   Height:      Weight:      SpO2: 97% 100%  98%    Intake/Output Summary (Last 24 hours) at 04/08/15 1149 Last data filed at 04/08/15 0900  Gross per 24 hour  Intake 1697.5 ml  Output   1325 ml  Net  372.5 ml    Exam:   General:  Pt is alert, follows commands appropriately, not in acute distress  Cardiovascular: Regular rate and rhythm, no rubs, no gallops  Respiratory: Clear to auscultation bilaterally, no wheezing, no crackles, no rhonchi  Abdomen: Soft, non tender, non distended, bowel sounds present, no guarding  Extremities: pulses DP and PT palpable bilaterally  Neuro: Grossly nonfocal  Data Reviewed: Basic Metabolic Panel:  Recent Labs Lab 04-27-15 0959 27-Apr-2015 1137 04/08/15 0530  NA 139 136 139  K 2.8* 2.8* 3.3*  CL 109 117* 110  CO2  --  12* 21*  GLUCOSE 146* 119* 135*  BUN 24* 22* 16  CREATININE 1.80* 1.69* 1.18*  CALCIUM  --  9.3 8.5*  MG  --  2.2  --    Liver Function Tests:  Recent Labs Lab Apr 27, 2015 1137  AST 29  ALT 47  ALKPHOS 80  BILITOT 0.9  PROT 7.3  ALBUMIN 4.0   CBC:  Recent Labs Lab 04/27/2015 0959 2015/04/27 1137 04/08/15 0530  WBC  --  13.3* 6.4  NEUTROABS  --  10.9*  --   HGB 19.4* 15.6* 11.9*  HCT 57.0* 45.1  33.4*  MCV  --  91.1 90.5  PLT  --  259 219   Cardiac Enzymes:  Recent Labs Lab 04/27/15 1137  CKTOTAL 98   CBG:  Recent Labs Lab Apr 27, 2015 1017 04/08/15 0553 04/08/15 0851  GLUCAP 135* 105* 136*   Scheduled Meds: .  stroke: mapping our early stages of recovery book   Does not apply Once  . antiseptic oral rinse  7 mL Mouth Rinse BID  . aspirin  300 mg Rectal Daily   Or  . aspirin  325 mg Oral Daily  . buPROPion  150 mg Oral Daily  . cycloSPORINE  1 drop Both Eyes BID  . heparin  5,000 Units Subcutaneous 3 times  per day  . Linaclotide  290 mcg Oral QAC breakfast  . LORazepam  0-4 mg Oral 4 times per day   Followed by  . [START ON 04/09/2015] LORazepam  0-4 mg Oral Q12H  . multivitamin with minerals  1 tablet Oral Daily  . olopatadine  1 drop Both Eyes BID  . pantoprazole  40 mg Oral Daily  . topiramate  100 mg Oral BID   Continuous Infusions: .  sodium bicarbonate  infusion 1000 mL 150 mL/hr at 04/08/15 1118

## 2015-04-09 DIAGNOSIS — F332 Major depressive disorder, recurrent severe without psychotic features: Secondary | ICD-10-CM | POA: Diagnosis present

## 2015-04-09 LAB — BASIC METABOLIC PANEL
Anion gap: 10 (ref 5–15)
BUN: 9 mg/dL (ref 6–20)
CO2: 33 mmol/L — ABNORMAL HIGH (ref 22–32)
Calcium: 8.4 mg/dL — ABNORMAL LOW (ref 8.9–10.3)
Chloride: 99 mmol/L — ABNORMAL LOW (ref 101–111)
Creatinine, Ser: 0.97 mg/dL (ref 0.44–1.00)
GFR calc Af Amer: >60 mL/min (ref >60)
GFR calc non Af Amer: 57 mL/min — ABNORMAL LOW (ref >60)
Glucose, Bld: 121 mg/dL — ABNORMAL HIGH (ref 70–99)
Potassium: 2.7 mmol/L — CL (ref 3.5–5.1)
Sodium: 142 mmol/L (ref 135–145)

## 2015-04-09 LAB — HEMOGLOBIN A1C
Hgb A1c MFr Bld: 5.6 % (ref 4.8–5.6)
Mean Plasma Glucose: 114 mg/dL

## 2015-04-09 LAB — CBC
HCT: 34.6 % — ABNORMAL LOW (ref 36.0–46.0)
Hemoglobin: 11.8 g/dL — ABNORMAL LOW (ref 12.0–15.0)
MCH: 31.1 pg (ref 26.0–34.0)
MCHC: 34.1 g/dL (ref 30.0–36.0)
MCV: 91.1 fL (ref 78.0–100.0)
Platelets: 206 10*3/uL (ref 150–400)
RBC: 3.8 MIL/uL — ABNORMAL LOW (ref 3.87–5.11)
RDW: 12.9 % (ref 11.5–15.5)
WBC: 7.9 10*3/uL (ref 4.0–10.5)

## 2015-04-09 LAB — CLOSTRIDIUM DIFFICILE BY PCR: Toxigenic C. Difficile by PCR: NEGATIVE

## 2015-04-09 LAB — GLUCOSE, CAPILLARY: Glucose-Capillary: 96 mg/dL (ref 70–99)

## 2015-04-09 LAB — MAGNESIUM: Magnesium: 1.8 mg/dL (ref 1.7–2.4)

## 2015-04-09 MED ORDER — SODIUM CHLORIDE 0.9 % IV SOLN
INTRAVENOUS | Status: DC
  Administered 2015-04-09 (×2): via INTRAVENOUS

## 2015-04-09 MED ORDER — POTASSIUM CHLORIDE 10 MEQ/100ML IV SOLN
10.0000 meq | INTRAVENOUS | Status: AC
  Administered 2015-04-09 (×5): 10 meq via INTRAVENOUS
  Filled 2015-04-09 (×5): qty 100

## 2015-04-09 MED ORDER — POTASSIUM CHLORIDE CRYS ER 20 MEQ PO TBCR: 40.0000 meq | EXTENDED_RELEASE_TABLET | Freq: Two times a day (BID) | ORAL | Status: DC

## 2015-04-09 MED ORDER — POTASSIUM CHLORIDE CRYS ER 20 MEQ PO TBCR
40.0000 meq | EXTENDED_RELEASE_TABLET | Freq: Once | ORAL | Status: AC
  Administered 2015-04-09: 40 meq via ORAL
  Filled 2015-04-09: qty 2

## 2015-04-09 NOTE — Progress Notes (Signed)
CSW met with patient & bedside and confirmed that she spoke with her husband last night and they have decided to go home with home health at discharge. RNCM, Kathy aware. CSW provided list of SNF bed offers incase she changes her mind once she gets home.   No further CSW needs identified - CSW signing off.   Raynaldo Opitz, [facility] Clinical Social Worker cell #: [phone]

## 2015-04-09 NOTE — Consult Note (Signed)
[facility] Consult   Reason for Consult:  depression Referring Physician:  Dr. Doyle Askew Patient Identification: Danielle Gaines MRN:  [id number] Principal Diagnosis: MDD (major depressive disorder), recurrent severe, without psychosis Diagnosis:   Patient Active Problem List   Diagnosis Date Noted  . MDD (major depressive disorder), recurrent severe, without psychosis [F33.2] 04/09/2015  . Slurred speech [R47.81] 04/07/2015  . Weakness of left upper extremity [R29.898] 04/07/2015  . Orthostatic hypotension [I95.1] 04/07/2015  . AKI (acute kidney injury) [N17.9] 04/07/2015  . Hypomagnesemia [E83.42] 04/07/2015  . Stroke [I63.9] 04/07/2015  . Epistaxis [R04.0] 10/23/2014  . Blurry vision, bilateral [H53.8] 10/23/2014  . Osteoporosis [M81.0] 07/24/2014  . Migraine - followed by Dr. Sima Matas in Neurology [G18.299] 05/24/2014  . Anxiety and depression - managed at [facility] [F41.8] 05/24/2014  . GERD (gastroesophageal reflux disease) [K21.9] 05/24/2014  . Macular degeneration - goes to [facility] for this [H35.30] 05/24/2014    Total Time spent with patient: 1 hour  Subjective:   Danielle Gaines is a 74 y.o. female patient admitted with depression, slurred speech and frequent falls.Marland Kitchen  HPI:  Danielle Gaines is a 74 y.o. year-old female admitted to [facility] due to increased falls, sedation and slurred speech. Psych consultation requested for medication management. Patient reports history of depression over several years and medication overdose about thirty yrs ago. She has been suffering with depression and anxiety since her mother passeda away about two years ago. She is forgetful and does not remember all her medication and why she is taking them. She reports seeing [facility] for psych medication management and recent tapering off alprazolam about a month ago and starting librium to avoid withdrawal symptoms. She has been receiving treatment for  migraines from Dr. Joretta Bachelor. She denied suicide / homicide ideations and psychosis. She is wondering why psych was called in when she has medical problems at this time. She is okay to cooperate when informed that I will be coordinating care with her physician regarding mental health medication and possible changes.    She lives at home with her husband and previously was independent for all ADLs and walked without an assistive device. For the last couple of weeks, she has felt like she is drunk and has had numerous falls. She states that both of her arms feel somewhat numb and weak, her left worse than her right. She has had several episodes where the room felt off balance and she has fallen. Her last fall was 3 days prior to admission and she fell and hit her head sharply on the edge of a chair. She denies loss of consciousness but since that time she has had even worse weakness of the left arm, slurred speech, and some confusion. She has intermittent chronic diarrhea which is unchanged. She also has intermittent chronic nausea with vomiting which is also unchanged. She denied recent fevers, chills, sinus congestion, sore throat, cough. She has chronic dysuria and has had tests for urinary tract infection which have been negative by her primary care doctor. She asked her primary care doctor to taper her off of her benzodiazepines, process which was started a couple months ago. She was started on Adderall during the day to keep her awake because she felt like she was oversedated. She has not been eating or drinking as much particularly over the last week.  HPI Elements:   Location:  depression and anxiety. Quality:  poor due to sedation and falls. Severity:  unable to  function at home. Timing:  medication changes. Duration:  few weeks. Context:  unknown medical and medication related .  Past Medical History:  Past Medical History  Diagnosis Date  . Osteoporosis   . Migraine     migraines  . Depression    . Benzodiazepine dependence   . IBS (irritable bowel syndrome)   . Colon abnormality     didn't work right so part of it was removed at [facility]  . GERD (gastroesophageal reflux disease)     Past Surgical History  Procedure Laterality Date  . Appendectomy    . Cholecystectomy    . Abdominal hysterectomy    . Abdominal adhesion surgery    . Small intestine surgery    . Colon surgery    . Ovarian cyst surgery     Family History:  Family History  Problem Relation Age of Onset  . Kidney disease Mother   . Heart disease Mother   . Cancer Maternal Aunt     intestinal and liver cancer  . Stroke Maternal Grandmother   . Stroke    . Depression      everyone   Social History:  History  Alcohol Use No     History  Drug Use No    History   Social History  . Marital Status: Married    Spouse Name: N/A  . Number of Children: N/A  . Years of Education: N/A   Social History Main Topics  . Smoking status: Never Smoker   . Smokeless tobacco: Never Used  . Alcohol Use: No  . Drug Use: No  . Sexual Activity: No   Other Topics Concern  . None   Social History Narrative   Work or School: none      Home Situation: lives with husband      Spiritual Beliefs:       Lifestyle: no regular exercise; diet is ok            Additional Social History: lives with her husband and she has one child and husband has two children, four grand children and two great grand children. She enjoys reading and chores and denied alcohol, tobacco and drug of abuse.                           Allergies:   Allergies  Allergen Reactions  . Sulfonamide Derivatives Swelling    Labs:  Results for orders placed or performed during the hospital encounter of 04/07/15 (from the past 48 hour(s))  Hemoglobin A1c     Status: None   Collection Time: 04/08/15  5:30 AM  Result Value Ref Range   Hgb A1c MFr Bld 5.6 4.8 - 5.6 %    Comment: (NOTE)         Pre-diabetes: 5.7 - 6.4          Diabetes: >6.4         Glycemic control for adults with diabetes: <7.0    Mean Plasma Glucose 114 mg/dL    Comment: (NOTE) Performed At: [facility] [place], [place] [id number] Lindon Romp MD QI:[id number]   Lipid panel     Status: Abnormal   Collection Time: 04/08/15  5:30 AM  Result Value Ref Range   Cholesterol 127 0 - 200 mg/dL   Triglycerides 136 <150 mg/dL   HDL 39 (L) >40 mg/dL   Total CHOL/HDL Ratio 3.3 RATIO   VLDL 27 0 -  40 mg/dL   LDL Cholesterol 61 0 - 99 mg/dL    Comment:        Total Cholesterol/HDL:CHD Risk Coronary Heart Disease Risk Table                     Men   Women  1/2 Average Risk   3.4   3.3  Average Risk       5.0   4.4  2 X Average Risk   9.6   7.1  3 X Average Risk  23.4   11.0        Use the calculated Patient Ratio above and the CHD Risk Table to determine the patient's CHD Risk.        ATP III CLASSIFICATION (LDL):  <100     mg/dL   Optimal  100-129  mg/dL   Near or Above                    Optimal  130-159  mg/dL   Borderline  160-189  mg/dL   High  >190     mg/dL   Very High Performed at [facility] metabolic panel     Status: Abnormal   Collection Time: 04/08/15  5:30 AM  Result Value Ref Range   Sodium 139 135 - 145 mmol/L   Potassium 3.3 (L) 3.5 - 5.1 mmol/L   Chloride 110 101 - 111 mmol/L   CO2 21 (L) 22 - 32 mmol/L   Glucose, Bld 135 (H) 70 - 99 mg/dL   BUN 16 6 - 20 mg/dL   Creatinine, Ser 1.18 (H) 0.44 - 1.00 mg/dL   Calcium 8.5 (L) 8.9 - 10.3 mg/dL   GFR calc non Af Amer 45 (L) >60 mL/min   GFR calc Af Amer 52 (L) >60 mL/min    Comment: (NOTE) The eGFR has been calculated using the CKD EPI equation. This calculation has not been validated in all clinical situations. eGFR's persistently <60 mL/min signify possible Chronic Kidney Disease.    Anion gap 8 5 - 15  CBC     Status: Abnormal   Collection Time: 04/08/15  5:30 AM  Result Value Ref Range   WBC 6.4 4.0 - 10.5 K/uL    RBC 3.69 (L) 3.87 - 5.11 MIL/uL   Hemoglobin 11.9 (L) 12.0 - 15.0 g/dL    Comment: RESULT REPEATED AND VERIFIED DELTA CHECK NOTED    HCT 33.4 (L) 36.0 - 46.0 %   MCV 90.5 78.0 - 100.0 fL   MCH 32.2 26.0 - 34.0 pg   MCHC 35.6 30.0 - 36.0 g/dL   RDW 12.8 11.5 - 15.5 %   Platelets 219 150 - 400 K/uL  Glucose, capillary     Status: Abnormal   Collection Time: 04/08/15  5:53 AM  Result Value Ref Range   Glucose-Capillary 105 (H) 70 - 99 mg/dL  Glucose, capillary     Status: Abnormal   Collection Time: 04/08/15  8:51 AM  Result Value Ref Range   Glucose-Capillary 136 (H) 70 - 99 mg/dL  Clostridium Difficile by PCR     Status: None   Collection Time: 04/08/15 11:10 PM  Result Value Ref Range   C difficile by pcr NEGATIVE NEGATIVE  Basic metabolic panel     Status: Abnormal   Collection Time: 04/09/15  4:05 AM  Result Value Ref Range   Sodium 142 135 - 145 mmol/L   Potassium 2.7 (LL) 3.5 -  5.1 mmol/L    Comment: RESULT REPEATED AND VERIFIED DELTA CHECK NOTED CRITICAL RESULT CALLED TO, READ BACK BY AND VERIFIED WITH: Roger Kill RN @ [phone] ON 04/09/15 BY C DAVIS    Chloride 99 (L) 101 - 111 mmol/L   CO2 33 (H) 22 - 32 mmol/L   Glucose, Bld 121 (H) 70 - 99 mg/dL   BUN 9 6 - 20 mg/dL   Creatinine, Ser 0.97 0.44 - 1.00 mg/dL   Calcium 8.4 (L) 8.9 - 10.3 mg/dL   GFR calc non Af Amer 57 (L) >60 mL/min   GFR calc Af Amer >60 >60 mL/min    Comment: (NOTE) The eGFR has been calculated using the CKD EPI equation. This calculation has not been validated in all clinical situations. eGFR's persistently <60 mL/min signify possible Chronic Kidney Disease.    Anion gap 10 5 - 15  CBC     Status: Abnormal   Collection Time: 04/09/15  4:05 AM  Result Value Ref Range   WBC 7.9 4.0 - 10.5 K/uL   RBC 3.80 (L) 3.87 - 5.11 MIL/uL   Hemoglobin 11.8 (L) 12.0 - 15.0 g/dL   HCT 34.6 (L) 36.0 - 46.0 %   MCV 91.1 78.0 - 100.0 fL   MCH 31.1 26.0 - 34.0 pg   MCHC 34.1 30.0 - 36.0 g/dL   RDW 12.9  11.5 - 15.5 %   Platelets 206 150 - 400 K/uL  Magnesium     Status: None   Collection Time: 04/09/15  4:05 AM  Result Value Ref Range   Magnesium 1.8 1.7 - 2.4 mg/dL  Glucose, capillary     Status: None   Collection Time: 04/09/15  5:42 AM  Result Value Ref Range   Glucose-Capillary 96 70 - 99 mg/dL    Vitals: Blood pressure 94/39, pulse 94, temperature 98.1 F (36.7 C), temperature source Oral, resp. rate 18, height $RemoveBe'5\' 1"'hFhSnmWzN$  (1.549 m), weight 58.423 kg (128 lb 12.8 oz), SpO2 99 %.  Risk to Self: Is patient at risk for suicide?: No Risk to Others:   Prior Inpatient Therapy:   Prior Outpatient Therapy:    Current Facility-Administered Medications  Medication Dose Route Frequency Provider Last Rate Last Dose  .  stroke: mapping our early stages of recovery book   Does not apply Once Janece Canterbury, MD      . 0.9 %  sodium chloride infusion   Intravenous Continuous Theodis Blaze, MD 75 mL/hr at 04/09/15 1255    . acetaminophen (TYLENOL) tablet 650 mg  650 mg Oral Q4H PRN Janece Canterbury, MD       Or  . acetaminophen (TYLENOL) suppository 650 mg  650 mg Rectal Q4H PRN Janece Canterbury, MD      . antiseptic oral rinse (CPC / CETYLPYRIDINIUM CHLORIDE 0.05%) solution 7 mL  7 mL Mouth Rinse BID Janece Canterbury, MD   7 mL at 04/09/15 0800  . aspirin suppository 300 mg  300 mg Rectal Daily Janece Canterbury, MD       Or  . aspirin tablet 325 mg  325 mg Oral Daily Janece Canterbury, MD   325 mg at 04/09/15 1457  . buPROPion (WELLBUTRIN XL) 24 hr tablet 150 mg  150 mg Oral Daily Janece Canterbury, MD   150 mg at 04/09/15 0853  . cycloSPORINE (RESTASIS) 0.05 % ophthalmic emulsion 1 drop  1 drop Both Eyes BID Janece Canterbury, MD   1 drop at 04/09/15 0854  . heparin injection 5,000 Units  5,000 Units Subcutaneous 3 times per day Janece Canterbury, MD   5,000 Units at 04/09/15 1458  . hyoscyamine (LEVBID) 0.375 MG 12 hr tablet 0.375 mg  0.375 mg Oral Q12H PRN Janece Canterbury, MD      . LORazepam (ATIVAN)  tablet 1 mg  1 mg Oral Q6H PRN Janece Canterbury, MD       Or  . LORazepam (ATIVAN) injection 1 mg  1 mg Intravenous Q6H PRN Janece Canterbury, MD      . LORazepam (ATIVAN) tablet 0-4 mg  0-4 mg Oral 4 times per day Janece Canterbury, MD   0 mg at 04/07/15 1632   Followed by  . LORazepam (ATIVAN) tablet 0-4 mg  0-4 mg Oral Q12H Janece Canterbury, MD      . multivitamin with minerals tablet 1 tablet  1 tablet Oral Daily Janece Canterbury, MD   1 tablet at 04/09/15 1457  . olopatadine (PATANOL) 0.1 % ophthalmic solution 1 drop  1 drop Both Eyes BID Janece Canterbury, MD   1 drop at 04/09/15 0855  . pantoprazole (PROTONIX) EC tablet 40 mg  40 mg Oral Daily Janece Canterbury, MD   40 mg at 04/09/15 0853  . SUMAtriptan (IMITREX) tablet 100 mg  100 mg Oral Q2H PRN Janece Canterbury, MD      . topiramate (TOPAMAX) tablet 100 mg  100 mg Oral BID Janece Canterbury, MD   100 mg at 04/09/15 1884    Musculoskeletal: Strength & Muscle Tone: within normal limits Gait & Station: unable to stand Patient leans: N/A  Psychiatric Specialty Exam: Physical Exam as per history and physical  ROS, sedation, falls, loosing balance, migraine and depression with anxiety. She forgets and confused some time. Negative for comprehensive ROS except HPI.  Blood pressure 94/39, pulse 94, temperature 98.1 F (36.7 C), temperature source Oral, resp. rate 18, height $RemoveBe'5\' 1"'ufENEIfFF$  (1.549 m), weight 58.423 kg (128 lb 12.8 oz), SpO2 99 %.Body mass index is 24.35 kg/(m^2).  General Appearance: Casual  Eye Contact::  Good  Speech:  Clear and Coherent  Volume:  Decreased  Mood:  Anxious and Depressed  Affect:  Appropriate and Congruent  Thought Process:  Coherent, Goal Directed and Logical  Orientation:  Full (Time, Place, and Person)  Thought Content:  Rumination  Suicidal Thoughts:  No  Homicidal Thoughts:  No  Memory:  Immediate;   Fair Recent;   Fair  Judgement:  Fair  Insight:  Fair  Psychomotor Activity:  Decreased  Concentration:  Fair   Recall:  Poor  Fund of Knowledge:Fair  Language: Good  Akathisia:  Negative  Handed:  Right  AIMS (if indicated):     Assets:  Communication Skills Desire for Improvement Financial Resources/Insurance Housing Intimacy Leisure Time Resilience Social Support Talents/Skills Transportation  ADL's:  Impaired  Cognition: Impaired,  Mild  Sleep:      Medical Decision Making: New problem, with additional work up planned, Review of Psycho-Social Stressors (1), Review or order clinical lab tests (1), Review of Last Therapy Session (1), Review or order medicine tests (1), Review of Medication Regimen & Side Effects (2) and Review of New Medication or Change in Dosage (2)  Treatment Plan Summary: Daily contact with patient to assess and evaluate symptoms and progress in treatment, Medication management and Plan Review of recent medication from out patient providers, refer to psych social service for obtaining medical records and colleteral information  Plan: Patient has been unstable due to recent medication changes like xanax, librium, adderall etc.  She has been confused, forgetful, anxious and worried. She has been unsteady on her feet and frequent falls. Agree with psych medication changes made during this hospitalization, as she has been on polypharmacy and maximum doses.   Benzo withdrawal: Monitor for CIWA to prevent withdrawal seizures  Depression: Wellbutrin XL 150 mg daily, which does not cause sedation  Migraine headache: agree with Topamax 100 mg PO BID from 200 mg BID which may be reduced further due to common neurological side effects like dizziness, paresthesia, somnolence, cognittive dysfunction, tremors, ataxias, diplopia etc if needed.   Avoid or minimize opioids and benzo due to increased sedation and falls.   Patient does not meet criteria for psychiatric inpatient admission. Supportive therapy provided about ongoing stressors.   Appreciate psychiatric consultation and  follow up as clinically required Please contact [phone] or [phone] if needs further assistance  Disposition: May go home when medically stable, family is supportive and has out patient services.   Mohmed Farver,JANARDHAHA R. 04/09/2015 3:41 PM

## 2015-04-09 NOTE — Progress Notes (Signed)
Physical Therapy Treatment Patient Details Name: PHYLLISS STREGE MRN: [id number] DOB: [birth year] Today's Date: 04/09/2015    History of Present Illness Pt is a 74 year old female admitted for falls and slurred speech.  She has a h/o recurrent falls, chronic benzodiazapine use, migraines, and depression.  CT scan and MRI of the brain with no signs of acute stroke    PT Comments    Pt ambulated in hallway without RW, slightly unsteady however no physical assist or LOB.  Pt plans to d/c home with assist from spouse.  Recommend supervision/assist for mobility as pt unsteady today, also required min assist earlier with OT.   Follow Up Recommendations  Supervision for mobility/OOB;Home health PT (family choosing d/c home, decline SNF per notes)     Equipment Recommendations  Rolling walker with 5" wheels    Recommendations for Other Services       Precautions / Restrictions Precautions Precautions: Fall Restrictions Weight Bearing Restrictions: No    Mobility  Bed Mobility Overal bed mobility: Modified Independent                Transfers Overall transfer level: Needs assistance Equipment used: None Transfers: Sit to/from Stand Sit to Stand: Min guard         General transfer comment: min/guard for safety, uses UE to self assist  Ambulation/Gait Ambulation/Gait assistance: Min guard Ambulation Distance (Feet): 120 Feet Assistive device: None Gait Pattern/deviations: Step-through pattern;Decreased stride length     General Gait Details: slow cadence, wanted to ambulate without RW today, slightly unsteady and using UEs to hold wall/furniture in room however ambulated in hallway without LOB or physical assist   Stairs            Wheelchair Mobility    Modified Rankin (Stroke Patients Only)       Balance       Sitting balance - Comments: maintained static sitting with min guard for 3 minutes:  no leaning today   Standing balance support: During  functional activity Standing balance-Leahy Scale: Poor                      Cognition Arousal/Alertness: Awake/alert Behavior During Therapy: WFL for tasks assessed/performed Overall Cognitive Status: Within Functional Limits for tasks assessed Area of Impairment: Attention;Memory     Memory: Decreased short-term memory         General Comments: with decreased visual attention when assessing VOR    Exercises      General Comments        Pertinent Vitals/Pain Pain Assessment: No/denies pain Faces Pain Scale: Hurts little more Pain Location: abdomen Pain Descriptors / Indicators: Aching Pain Intervention(s): Limited activity within patient's tolerance;Monitored during session;Repositioned    Home Living                      Prior Function            PT Goals (current goals can now be found in the care plan section) Progress towards PT goals: Progressing toward goals    Frequency  Min 3X/week    PT Plan Current plan remains appropriate    Co-evaluation             End of Session Equipment Utilized During Treatment: Gait belt Activity Tolerance: Patient tolerated treatment well Patient left: in bed;with call bell/phone within reach     Time: 0998-3382 PT Time Calculation (min) (ACUTE ONLY): 11 min  Charges:  $Gait Training:  8-22 mins                    G Codes:      Arrian Manson,KATHrine E 04/30/15, 3:29 PM Carmelia Bake, PT, DPT April 30, 2015 Pager: [phone]

## 2015-04-09 NOTE — Progress Notes (Addendum)
Patient ID: Danielle Gaines, female   DOB: [birth year], 74 y.o.   MRN: [id number]  [facility] PROGRESS NOTE  Danielle Gaines WUJ:[id number] DOB: [birth year] DOA: 04/07/2015 PCP: Lucretia Kern., DO   Brief narrative:    74 y.o. year-old female with history of migraines, IBS, depression, chronic benzodiazepine use followed by psychiatry, osteoporosis who presented with recurrent falls, slurred speech. The patient was last at her baseline health until about a month ago. She lives at home with her husband and previously was independent for all ADLs and walked without an assistive device. For the last couple of weeks, she has felt like she is drunk and has had numerous falls. She states that both of her arms feel somewhat numb and weak, her left worse than her right. She has had several episodes where the room felt off balance and she has fallen. Her last fall was 3 days prior to admission and she fell and hit her head sharply on the edge of a chair. She denies loss of consciousness but since that time she has had even worse weakness of the left arm, slurred speech, and some confusion. She has intermittent chronic diarrhea which is unchanged. She also has intermittent chronic nausea with vomiting which is also unchanged. She denied recent fevers, chills, sinus congestion, sore throat, cough. She has chronic dysuria and has had tests for urinary tract infection which have been negative by her primary care doctor. She asked her primary care doctor to taper her off of her benzodiazepines, process which was started a couple months ago. She was started on Adderall during the day to keep her awake because she felt like she was oversedated. She has not been eating or drinking as much particularly over the last week.  Assessment/Plan:    Recurrent falls, slurred speech - Certainly worrisome for underlying CVA, please note that CT scan and MRI of the brain with no signs of acute stroke - orthostatics positive on  admission, will repeat again today  - No significant change in neurological status since admission, slurred speech still present but per patient this has been improving - Patient has been on sedating medications in the past which could have certainly contributed to episodes of fall - Her medical regimen currently includes Wellbutrin, Cymbalta, Librium, Topamax, adderall  -Carotid duplex and ECHO with no acute findings to explain the falls  -PT/OT/SLP requested, pt still to decide if she would agree with SNF -Continue to hold sedating medications -CIWA for benzo withdrawal  Depression/anxiety - halved cymbalta and wellbutrin doses - still depressed, psych consulted   Chronic benzodiazepine use - trying to wean herself off benzos -continue CIWA protocol - no signs of withdrawal sine admission   Migraine headaches - stable, continue prn imitrex  IBS - stable, continue pancrelipase, hyoscyamine  Diarrhea - C. Diff negative - linzess and see if that helps   GERD - stable, continue PPI  Osteoporosis - stable, hold alendronate  Hyperglycemia - A1c 5.6, no need for antihyperglycemic regimen upon discharge - continue SSI only while inpatient   Acute kidney injury, hypokalemia and elevated hemoglobin suggest dehydration. Orthostatics positive - responding to IVF, Cr is trending down from 1.8 --> 1.18 --> 0.97 - continue to supplement as K is still low, will give via IV route  - Repeat CBC and BMP in a.m.  DVT prophylaxis - Heparin SQ  Code Status: DNR Family Communication:  plan of care discussed with the patient and husband at bedside Disposition Plan: Tiajuana Amass  to discharge - still requiring IVF, KCl, psych consult for depression   IV access:  Peripheral IV  Procedures and diagnostic studies:    Ct Head Wo Contrast  02-May-2015  Normal head CT for 74 years.     Mr Danielle Gaines Wo Contrast  05-02-2015   Normal MRI of the brain for a person of 74 years.  No acute insult.  Normal  intracranial MR angiography of the large and medium size vessels.     al MRI of the brain for a person of 74 years.  No acute insult.  Normal intracranial MR angiography of the large and medium size vessels.   Electronically Signed   By: Nelson Chimes M.D.   On: 05-02-2015 12:28   Dg Chest Port 1 View  02-May-2015   No active disease.  Mild hyperinflation.    Medical Consultants:  Psych  Other Consultants:  PT/OT/SLP  IAnti-Infectives:   None   Faye Ramsay, MD  [facility] Pager [phone]  If 7PM-7AM, please contact night-coverage [email or web address] Password [facility] 04/09/2015, 9:27 AM  HPI/Subjective: No events overnight. Still feels week.  Objective: Filed Vitals:   04/08/15 1607 04/08/15 2127 04/09/15 0200 04/09/15 0525  BP: 148/119 112/46 110/65 103/69  Pulse: 87 84 86 81  Temp:  98.2 F (36.8 C) 98.1 F (36.7 C) 98.1 F (36.7 C)  TempSrc:  Oral Oral Oral  Resp: 18 16 18 18   Height:      Weight:      SpO2: 100% 97% 98% 97%    Intake/Output Summary (Last 24 hours) at 04/09/15 0927 Last data filed at 04/09/15 0700  Gross per 24 hour  Intake 3635.83 ml  Output   1100 ml  Net 2535.83 ml    Exam:   General:  Pt is alert, follows commands appropriately, not in acute distress  Cardiovascular: Regular rate and rhythm, no gallops  Respiratory: Clear to auscultation bilaterally, no wheezing, no crackles, no rhonchi  Abdomen: Soft, non tender, non distended, bowel sounds present, no guarding  Extremities: pulses DP and PT palpable bilaterally  Data Reviewed: Basic Metabolic Panel:  Recent Labs Lab May 02, 2015 0959 May 02, 2015 1137 04/08/15 0530 04/09/15 0405  NA 139 136 139 142  K 2.8* 2.8* 3.3* 2.7*  CL 109 117* 110 99*  CO2  --  12* 21* 33*  GLUCOSE 146* 119* 135* 121*  BUN 24* 22* 16 9  CREATININE 1.80* 1.69* 1.18* 0.97  CALCIUM  --  9.3 8.5* 8.4*  MG  --  2.2  --   --    Liver Function Tests:  Recent Labs Lab 2015/05/02 1137  AST 29  ALT 47  ALKPHOS 80   BILITOT 0.9  PROT 7.3  ALBUMIN 4.0   CBC:  Recent Labs Lab May 02, 2015 0959 05-02-15 1137 04/08/15 0530 04/09/15 0405  WBC  --  13.3* 6.4 7.9  NEUTROABS  --  10.9*  --   --   HGB 19.4* 15.6* 11.9* 11.8*  HCT 57.0* 45.1 33.4* 34.6*  MCV  --  91.1 90.5 91.1  PLT  --  259 219 206   Cardiac Enzymes:  Recent Labs Lab May 02, 2015 1137  CKTOTAL 98   CBG:  Recent Labs Lab 02-May-2015 1017 04/08/15 0553 04/08/15 0851 04/09/15 0542  GLUCAP 135* 105* 136* 96   Scheduled Meds: .  stroke: mapping our early stages of recovery book   Does not apply Once  . antiseptic oral rinse  7 mL Mouth Rinse BID  . aspirin  300 mg  Rectal Daily   Or  . aspirin  325 mg Oral Daily  . buPROPion  150 mg Oral Daily  . cycloSPORINE  1 drop Both Eyes BID  . heparin  5,000 Units Subcutaneous 3 times per day  . Linaclotide  290 mcg Oral QAC breakfast  . LORazepam  0-4 mg Oral 4 times per day   Followed by  . LORazepam  0-4 mg Oral Q12H  . multivitamin with minerals  1 tablet Oral Daily  . olopatadine  1 drop Both Eyes BID  . pantoprazole  40 mg Oral Daily  . potassium chloride  10 mEq Intravenous Q1 Hr x 5  . topiramate  100 mg Oral BID   Continuous Infusions: . sodium chloride 100 mL/hr at 04/09/15 0343

## 2015-04-09 NOTE — Evaluation (Signed)
Speech Language Pathology Evaluation Patient Details Name: Danielle Gaines MRN: [id number] DOB: November 08, 1941 Today's Date: 04/09/2015 Time: 1010-1057 SLP Time Calculation (min) (ACUTE ONLY): 47 min  Problem List:  Patient Active Problem List   Diagnosis Date Noted  . Slurred speech 04/07/2015  . Weakness of left upper extremity 04/07/2015  . Orthostatic hypotension 04/07/2015  . AKI (acute kidney injury) 04/07/2015  . Hypomagnesemia 04/07/2015  . Stroke 04/07/2015  . Epistaxis 10/23/2014  . Blurry vision, bilateral 10/23/2014  . Osteoporosis 07/24/2014  . Migraine - followed by Dr. Sima Matas in Neurology 05/24/2014  . Anxiety and depression - managed at [facility] 05/24/2014  . GERD (gastroesophageal reflux disease) 05/24/2014  . Macular degeneration - goes to [facility] for this 05/24/2014   Past Medical History:  Past Medical History  Diagnosis Date  . Osteoporosis   . Migraine     migraines  . Depression   . Benzodiazepine dependence   . IBS (irritable bowel syndrome)   . Colon abnormality     didn't work right so part of it was removed at [facility]  . GERD (gastroesophageal reflux disease)    Past Surgical History:  Past Surgical History  Procedure Laterality Date  . Appendectomy    . Cholecystectomy    . Abdominal hysterectomy    . Abdominal adhesion surgery    . Small intestine surgery    . Colon surgery    . Ovarian cyst surgery     HPI:  The patient is a 74 y.o. year-old female with history of migraines, IBS, depression, chronic benzodiazepine use followed by psychiatry, osteoporosis who presents with recurrent falls, slurred speech. The patient was last at their baseline health until about a month ago. She lives at home with her husband and previously was independent for all ADLs and walked without an assistive device. For the last couple of weeks, she has felt like she is drunk and has had numerous falls. She states that both of her arms feel somewhat numb and  weak, her left worse than her right. She has had several episodes where the room felt off balance and she has fallen. She denies losing consciousness. Her last fall was 3 days prior to admission and she fell and hit her head sharply on the edge of a chair. She denies loss of consciousness but since that time she has had even worse weakness of the left arm, slurred speech, and some confusion. She has intermittent chronic diarrhea which is unchanged. She also has intermittent chronic nausea with vomiting which is also unchanged. She denies recent fevers, chills, sinus congestion, sore throat, cough. She has chronic dysuria and has had tests for urinary tract infection which have been negative by her primary care doctor. She asked her primary care doctor to taper her off of her benzodiazepines, process which was started a couple months ago. She was started on Adderall during the day to keep her awake because she felt like she was oversedated. She has not been eating or drinking as much particularly over the last week.   Assessment / Plan / Recommendation Clinical Impression  Pt presents with intact speech and language.  She did demonstrate cognitive deficits impacting memory storage and retrieval.  Pt able to recall 1 of 4 words independently without cues within 10 minutes.  She did not recall 2 of 4 words with multiple choice cue.  Results indicate severe memory impairment.  SLP questions impact of pt's depression on functional memory.  Pt became tearful during  session and stated she "just wanted to be lost".  Problem solving ability for basic function intact - eg: need to use call bell for assistance.   Pt reports spouse manages cooking, cleaning, bill paying, appts, etc at home.  Pt does manage her own medications and SLP encouraged spouse to assure double checking for safety.  All education completed and pt will have support level required at home.      SLP Assessment  Patient does not need any further  Speech Lanaguage Pathology Services    Follow Up Recommendations  None    Frequency and Duration   n/a     Pertinent Vitals/Pain Pain Assessment:  (abdomen pain- slp offered to call for medicine or hot pack, pt declined)   SLP Goals  Progression toward goals: Progressing toward goals Patient/Family Stated Goal: to go home  SLP Evaluation Prior Functioning  Type of Home: House Available Help at Discharge: Family;Available PRN/intermittently   Cognition  Overall Cognitive Status: Impaired/Different from baseline (spouse reports pt improving) Arousal/Alertness: Awake/alert Orientation Level: Oriented to person;Oriented to place (pt reports she does not keep up with the time) Attention: Sustained Sustained Attention: Appears intact Memory: Impaired Memory Impairment: Storage deficit;Retrieval deficit;Decreased recall of new information Problem Solving: Appears intact (for basic to use call bell for assist) Behaviors:  (tearful during session)    Comprehension  Auditory Comprehension Overall Auditory Comprehension: Appears within functional limits for tasks assessed Yes/No Questions: Not tested Commands: Within Functional Limits Conversation: Complex Visual Recognition/Discrimination Discrimination: Not tested Reading Comprehension Reading Status: Within funtional limits (tested only at basic level)    Expression Expression Primary Mode of Expression: Verbal Verbal Expression Overall Verbal Expression: Appears within functional limits for tasks assessed Initiation: No impairment Level of Generative/Spontaneous Verbalization: Conversation Naming: Not tested Pragmatics: No impairment (flat affect noted) Written Expression Dominant Hand: Right Written Expression: Within Functional Limits   Oral / Motor Oral Motor/Sensory Function Overall Oral Motor/Sensory Function: Appears within functional limits for tasks assessed Motor Speech Phonation: Low vocal  intensity Resonance: Within functional limits Articulation: Within functional limitis Intelligibility: Intelligible Motor Planning: Witnin functional limits Motor Speech Errors: Not applicable   [place], [place] SLP [phone]
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
## 2015-04-09 NOTE — Progress Notes (Addendum)
Occupational Therapy Treatment Patient Details Name: Danielle Gaines MRN: [id number] DOB: [birth year] Today's Date: 04/09/2015    History of present illness Pt was admitted for falls and slurred speech.  She has a h/o recurrent falls, chronic benzodiazapine use, migraines, and depression   OT comments  No dizziness this session.  Pt needs min A to min guard for safety when standing and ambulating.    Follow Up Recommendations  Supervision/Assistance - 24 hour;Home health OT (noted pt and husband decline SNF)    Equipment Recommendations  3 in 1 bedside comode    Recommendations for Other Services      Precautions / Restrictions Precautions Precautions: Fall Restrictions Weight Bearing Restrictions: No       Mobility Bed Mobility Overal bed mobility: Modified Independent                Transfers   Equipment used: Rolling walker (2 wheeled) Transfers: Sit to/from Stand Sit to Stand: Min assist;Min guard         General transfer comment: min guard using RW, min A without device.    Balance       Sitting balance - Comments: maintained static sitting with min guard for 3 minutes:  no leaning today   Standing balance support: During functional activity Standing balance-Leahy Scale: Poor                     ADL       Grooming: Minimal assistance;Oral care;Standing                                 General ADL Comments: ambulated to bathroom with min A.  Pt is not used to using a RW, ambulated with hand held assistance (and pt held to furniture and wall) then used RW to ambulate back.  When standing at sink, it felt like pt's knees were buckling but pt stated she didn't think they did.  Therapist had hand on gait belt.  Pt did not get dizzy this session.  She has described spinning and nausea before she fell last time.  She was unable to sustain visual attention for VOR.  Asymptomatic in provoking positions for BPPV.  Min A toilet transfer:  3:1      Tourist information centre manager   Behavior During Therapy: WFL for tasks assessed/performed Overall Cognitive Status: Impaired/Different from baseline Area of Impairment: Attention;Memory     Memory: Decreased short-term memory          General Comments: with decreased visual attention when assessing VOR    Extremity/Trunk Assessment               Exercises     Shoulder Instructions       General Comments      Pertinent Vitals/ Pain       Pain Assessment: Faces Faces Pain Scale: Hurts little more Pain Location: abdomen Pain Descriptors / Indicators: Aching Pain Intervention(s): Limited activity within patient's tolerance;Monitored during session;Repositioned  Home Living     Available Help at Discharge: Family;Available PRN/intermittently Type of Home: House  Prior Functioning/Environment              Frequency Min 2X/week     Progress Toward Goals  OT Goals(current goals can now be found in the care plan section)  Progress towards OT goals: Progressing toward goals     Plan      Co-evaluation                 End of Session     Activity Tolerance Patient tolerated treatment well   Patient Left in bed;with call bell/phone within reach;with bed alarm set   Nurse Communication          Time: 0076-2263 OT Time Calculation (min): 23 min  Charges: OT General Charges $OT Visit: 1 Procedure OT Treatments $Self Care/Home Management : 8-22 mins $Therapeutic Activity: 8-22 mins  Tamotsu Wiederholt 04/09/2015, 2:34 PM Lesle Chris, OTR/L [phone] 04/09/2015

## 2015-04-10 DIAGNOSIS — F332 Major depressive disorder, recurrent severe without psychotic features: Secondary | ICD-10-CM

## 2015-04-10 DIAGNOSIS — F418 Other specified anxiety disorders: Secondary | ICD-10-CM

## 2015-04-10 LAB — BASIC METABOLIC PANEL
Anion gap: 9 (ref 5–15)
BUN: 7 mg/dL (ref 6–20)
CO2: 24 mmol/L (ref 22–32)
Calcium: 8.6 mg/dL — ABNORMAL LOW (ref 8.9–10.3)
Chloride: 110 mmol/L (ref 101–111)
Creatinine, Ser: 0.98 mg/dL (ref 0.44–1.00)
GFR calc Af Amer: >60 mL/min (ref >60)
GFR calc non Af Amer: 57 mL/min — ABNORMAL LOW (ref >60)
Glucose, Bld: 121 mg/dL — ABNORMAL HIGH (ref 65–99)
Potassium: 3.5 mmol/L (ref 3.5–5.1)
Sodium: 143 mmol/L (ref 135–145)

## 2015-04-10 LAB — CBC
HCT: 34.1 % — ABNORMAL LOW (ref 36.0–46.0)
Hemoglobin: 11.3 g/dL — ABNORMAL LOW (ref 12.0–15.0)
MCH: 31.2 pg (ref 26.0–34.0)
MCHC: 33.1 g/dL (ref 30.0–36.0)
MCV: 94.2 fL (ref 78.0–100.0)
Platelets: 202 10*3/uL (ref 150–400)
RBC: 3.62 MIL/uL — ABNORMAL LOW (ref 3.87–5.11)
RDW: 13.5 % (ref 11.5–15.5)
WBC: 6.8 10*3/uL (ref 4.0–10.5)

## 2015-04-10 LAB — GLUCOSE, CAPILLARY: Glucose-Capillary: 105 mg/dL — ABNORMAL HIGH (ref 65–99)

## 2015-04-10 MED ORDER — POTASSIUM CHLORIDE CRYS ER 20 MEQ PO TBCR
40.0000 meq | EXTENDED_RELEASE_TABLET | Freq: Once | ORAL | Status: AC
  Administered 2015-04-10: 40 meq via ORAL
  Filled 2015-04-10: qty 2

## 2015-04-10 NOTE — Progress Notes (Signed)
Patient ID: Danielle Gaines, female   DOB: [birth year], 74 y.o.   MRN: [id number]  [facility] PROGRESS NOTE  Danielle Gaines GGY:[id number] DOB: [birth year] DOA: 04/07/2015 PCP: Lucretia Kern., DO   Brief narrative:    74 y.o. year-old female with history of migraines, IBS, depression, chronic benzodiazepine use followed by psychiatry, osteoporosis who presented with recurrent falls, slurred speech. The patient was last at her baseline health until about a month ago. She lives at home with her husband and previously was independent for all ADLs and walked without an assistive device. For the last couple of weeks, she has felt like she is drunk and has had numerous falls. She states that both of her arms feel somewhat numb and weak, her left worse than her right. She has had several episodes where the room felt off balance and she has fallen. Her last fall was 3 days prior to admission and she fell and hit her head sharply on the edge of a chair. She denies loss of consciousness but since that time she has had even worse weakness of the left arm, slurred speech, and some confusion. She has intermittent chronic diarrhea which is unchanged. She also has intermittent chronic nausea with vomiting which is also unchanged. She denied recent fevers, chills, sinus congestion, sore throat, cough. She has chronic dysuria and has had tests for urinary tract infection which have been negative by her primary care doctor. She asked her primary care doctor to taper her off of her benzodiazepines, process which was started a couple months ago. She was started on Adderall during the day to keep her awake because she felt like she was oversedated. She has not been eating or drinking as much particularly over the last week.  Assessment/Plan:    Recurrent falls, slurred speech - CT scan and MRI of the brain with no signs of acute stroke - orthostatics positive on admission, repeat orthostatic WNL - No significant change  in neurological status since admission, slurred speech now resolved  - Patient has been on sedating medications in the past which could have certainly contributed to episodes of fall - Her medical regimen currently includes Wellbutrin, Cymbalta, Librium, Topamax, adderall  -Carotid duplex and ECHO with no acute findings to explain the falls  - appreciate psychiatrist recommendations to continue Wellbutrin XL 150 mg PO QD, which does not cause sedation  -Continue to hold sedating medications -CIWA for benzo withdrawal  Depression/anxiety - halved cymbalta and wellbutrin doses - appreciate psychiatrist recommendations   Chronic benzodiazepine use - trying to wean herself off benzos -continue CIWA protocol - no signs of withdrawal since admission   Migraine headaches - Topamax 100 mg PO BID, halved from 200 mg BID and may be reduced further due to common neurological side effects like dizziness, paresthesia, somnolence, cognittive dysfunction, tremors, ataxias, diplopia etc if needed.   IBS - stable, continue pancrelipase, hyoscyamine - better   Diarrhea - C. Diff negative - linzess stopped and diarrhea is improving   GERD - stable, continue PPI  Osteoporosis - stable, hold alendronate  Hyperglycemia - A1c 5.6, no need for antihyperglycemic regimen upon discharge - continue SSI only while inpatient   Acute kidney injury, hypokalemia and elevated hemoglobin suggest dehydration. Orthostatics positive - responding to IVF, Cr is trending down from 1.8 --> 1.18 --> 0.97 - continue to supplement as K is still on low end of normal  - Repeat CBC and BMP in a.m.  DVT prophylaxis - Heparin SQ  Code Status: DNR Family Communication:  plan of care discussed with the patient and husband at bedside Disposition Plan: Barrier to discharge - d/c 5/13  IV access:  Peripheral IV  Procedures and diagnostic studies:    Ct Head Wo Contrast  May 01, 2015  Normal head CT for age.      Mr Virgel Paling Wo Contrast  05-01-2015   Normal MRI of the brain for a person of this age.  No acute insult.  Normal intracranial MR angiography of the large and medium size vessels.     al MRI of the brain for a person of this age.  No acute insult.  Normal intracranial MR angiography of the large and medium size vessels.   Electronically Signed   By: Nelson Chimes M.D.   On: 05-01-2015 12:28   Dg Chest Port 1 View  05-01-2015   No active disease.  Mild hyperinflation.    Medical Consultants:  Psych  Other Consultants:  PT/OT/SLP  IAnti-Infectives:   None   Faye Ramsay, MD  [facility] Pager [phone]  If 7PM-7AM, please contact night-coverage [email or web address] Password [facility] [address] 04/10/2015, 10:39 AM  HPI/Subjective: No events overnight.   Objective: Filed Vitals:   04/09/15 0525 04/09/15 1308 04/09/15 2118 04/10/15 0455  BP: 103/69 94/39 100/41 91/36  Pulse: 81 94 84 75  Temp: 98.1 F (36.7 C) 98.1 F (36.7 C) 98.2 F (36.8 C) 98.1 F (36.7 C)  TempSrc: Oral Oral Oral Oral  Resp: 18 18 18 18   Height:      Weight:      SpO2: 97% 99% 98% 100%    Intake/Output Summary (Last 24 hours) at 04/10/15 1039 Last data filed at 04/10/15 0900  Gross per 24 hour  Intake 1442.5 ml  Output   2850 ml  Net -1407.5 ml    Exam:   General:  Pt is alert, follows commands appropriately, not in acute distress  Cardiovascular: Regular rate and rhythm, no gallops  Respiratory: Clear to auscultation bilaterally, no wheezing, no crackles, no rhonchi  Abdomen: Soft, non tender, non distended, bowel sounds present, no guarding  Extremities: pulses DP and PT palpable bilaterally  Data Reviewed: Basic Metabolic Panel:  Recent Labs Lab 01-May-2015 0959 05-01-2015 1137 04/08/15 0530 04/09/15 0405 04/10/15 0435  NA 139 136 139 142 143  K 2.8* 2.8* 3.3* 2.7* 3.5  CL 109 117* 110 99* 110  CO2  --  12* 21* 33* 24  GLUCOSE 146* 119* 135* 121* 121*  BUN 24* 22* 16 9 7   CREATININE 1.80* 1.69*  1.18* 0.97 0.98  CALCIUM  --  9.3 8.5* 8.4* 8.6*  MG  --  2.2  --  1.8  --    Liver Function Tests:  Recent Labs Lab 01-May-2015 1137  AST 29  ALT 47  ALKPHOS 80  BILITOT 0.9  PROT 7.3  ALBUMIN 4.0   CBC:  Recent Labs Lab 05/01/15 0959 05-01-2015 1137 04/08/15 0530 04/09/15 0405 04/10/15 0435  WBC  --  13.3* 6.4 7.9 6.8  NEUTROABS  --  10.9*  --   --   --   HGB 19.4* 15.6* 11.9* 11.8* 11.3*  HCT 57.0* 45.1 33.4* 34.6* 34.1*  MCV  --  91.1 90.5 91.1 94.2  PLT  --  259 219 206 202   Cardiac Enzymes:  Recent Labs Lab May 01, 2015 1137  CKTOTAL 98   CBG:  Recent Labs Lab 2015-05-01 1017 04/08/15 0553 04/08/15 0851 04/09/15 0542 04/10/15 0449  GLUCAP 135* 105* 136* 96  105*   Scheduled Meds: .  stroke: mapping our early stages of recovery book   Does not apply Once  . antiseptic oral rinse  7 mL Mouth Rinse BID  . aspirin  300 mg Rectal Daily   Or  . aspirin  325 mg Oral Daily  . buPROPion  150 mg Oral Daily  . cycloSPORINE  1 drop Both Eyes BID  . heparin  5,000 Units Subcutaneous 3 times per day  . LORazepam  0-4 mg Oral Q12H  . multivitamin with minerals  1 tablet Oral Daily  . olopatadine  1 drop Both Eyes BID  . pantoprazole  40 mg Oral Daily  . topiramate  100 mg Oral BID   Continuous Infusions: . sodium chloride 75 mL/hr at 04/09/15 1255

## 2015-04-10 NOTE — Progress Notes (Signed)
Physical Therapy Treatment Patient Details Name: Danielle Gaines MRN: [id number] DOB: 14-Sep-1941 Today's Date: 04-27-15    History of Present Illness Pt is a 74 year old female admitted for falls and slurred speech.  She has a h/o recurrent falls, chronic benzodiazapine use, migraines, and depression.  CT scan and MRI of the brain with no signs of acute stroke    PT Comments    Pt improving with mobility and very hopeful to discharge home soon.  Follow Up Recommendations  Supervision for mobility/OOB;Home health PT     Equipment Recommendations  Rolling walker with 5" wheels    Recommendations for Other Services       Precautions / Restrictions Precautions Precautions: Fall Restrictions Weight Bearing Restrictions: No    Mobility  Bed Mobility Overal bed mobility: Modified Independent                Transfers Overall transfer level: Needs assistance Equipment used: None   Sit to Stand: Min guard         General transfer comment: min/guard for safety, uses UE to self assist  Ambulation/Gait Ambulation/Gait assistance: Min guard Ambulation Distance (Feet): 200 Feet Assistive device: None Gait Pattern/deviations: Step-through pattern;Decreased stride length     General Gait Details: slow cadence, LOB upon initiating however able to self correct, continues to be slightly unsteady however improved since yesterday, without LOB or physical assist   Stairs            Wheelchair Mobility    Modified Rankin (Stroke Patients Only)       Balance                                    Cognition Arousal/Alertness: Awake/alert Behavior During Therapy: WFL for tasks assessed/performed Overall Cognitive Status: Within Functional Limits for tasks assessed                      Exercises      General Comments        Pertinent Vitals/Pain Pain Assessment: No/denies pain    Home Living                      Prior  Function            PT Goals (current goals can now be found in the care plan section) Progress towards PT goals: Progressing toward goals    Frequency  Min 3X/week    PT Plan Current plan remains appropriate    Co-evaluation             End of Session Equipment Utilized During Treatment: Gait belt Activity Tolerance: Patient tolerated treatment well Patient left: in bed;with call bell/phone within reach     Time: 1123-1131 PT Time Calculation (min) (ACUTE ONLY): 8 min  Charges:  $Gait Training: 8-22 mins                    G Codes:      Nashley Cordoba,KATHrine E 04/27/15, 1:17 PM Carmelia Bake, PT, DPT Apr 27, 2015 Pager: [phone]

## 2015-04-10 NOTE — Consult Note (Signed)
Psychiatry Consult follow-up  Reason for Consult:  depression Referring Physician:  Dr. Izola Price Patient Identification: Danielle Gaines MRN:  [id number] Principal Diagnosis: MDD (major depressive disorder), recurrent severe, without psychosis Diagnosis:   Patient Active Problem List   Diagnosis Date Noted  . MDD (major depressive disorder), recurrent severe, without psychosis [F33.2] 04/09/2015  . Slurred speech [R47.81] 04/07/2015  . Weakness of left upper extremity [R29.898] 04/07/2015  . Orthostatic hypotension [I95.1] 04/07/2015  . AKI (acute kidney injury) [N17.9] 04/07/2015  . Hypomagnesemia [E83.42] 04/07/2015  . Stroke [I63.9] 04/07/2015  . Epistaxis [R04.0] 10/23/2014  . Blurry vision, bilateral [H53.8] 10/23/2014  . Osteoporosis [M81.0] 07/24/2014  . Migraine - followed by Dr. Catalina Lunger in Neurology [G43.909] 05/24/2014  . Anxiety and depression - managed at [facility] [F41.8] 05/24/2014  . GERD (gastroesophageal reflux disease) [K21.9] 05/24/2014  . Macular degeneration - goes to [facility] for this [H35.30] 05/24/2014    Total Time spent with patient: 30 minutes  Subjective:   Danielle Gaines is a 74 y.o. female patient admitted with depression, slurred speech and frequent falls.Marland Kitchen  HPI:  Danielle Gaines is a 74 y.o. year-old female admitted to [facility] due to increased falls, sedation and slurred speech. Psych consultation requested for medication management. Patient reports history of depression over several years and medication overdose about thirty yrs ago. She has been suffering with depression and anxiety since her mother passeda away about two years ago. She is forgetful and does not remember all her medication and why she is taking them. [facility] medication management and recent tapering off alprazolam about a month ago and starting librium to avoid withdrawal symptoms. She has been receiving treatment for  migraines from Dr. Sharene Skeans. She denied suicide / homicide ideations and psychosis. She is wondering why psych was called in when she has medical problems at this time. She is okay to cooperate when informed that I will be coordinating care with her physician regarding mental health medication and possible changes.    Interval history: Patient seen at the psychiatric social service regarding consultation follow-up for depression and anxiety. Patient has been compliant with her medication management and tolerating well without side effects. Patient reported she is feeling better and hoping to be discharged tomorrow. Patient denied current symptoms of depression, anxiety, mania, psychosis. Patient has no current suicidal/homicidal ideation intentions or plan. Patient has no evidence of benzodiazepine withdrawal. Patient contract for safety at this time.  Past Medical History:  Past Medical History  Diagnosis Date  . Osteoporosis   . Migraine     migraines  . Depression   . Benzodiazepine dependence   . IBS (irritable bowel syndrome)   . Colon abnormality     didn't work right so part of it was removed at [facility]  . GERD (gastroesophageal reflux disease)     Past Surgical History  Procedure Laterality Date  . Appendectomy    . Cholecystectomy    . Abdominal hysterectomy    . Abdominal adhesion surgery    . Small intestine surgery    . Colon surgery    . Ovarian cyst surgery     Family History:  Family History  Problem Relation Age of Onset  . Kidney disease Mother   . Heart disease Mother   . Cancer Maternal Aunt     intestinal and liver cancer  . Stroke Maternal Grandmother   . Stroke    . Depression  everyone   Social History:  History  Alcohol Use No     History  Drug Use No    History   Social History  . Marital Status: Married    Spouse Name: N/A  . Number of Children: N/A  . Years of Education: N/A   Social History Main Topics  . Smoking status: Never  Smoker   . Smokeless tobacco: Never Used  . Alcohol Use: No  . Drug Use: No  . Sexual Activity: No   Other Topics Concern  . None   Social History Narrative   Work or School: none      Home Situation: lives with husband      Spiritual Beliefs:       Lifestyle: no regular exercise; diet is ok            Additional Social History: lives with her husband and she has one child and husband has two children, four grand children and two great grand children. She enjoys reading and chores and denied alcohol, tobacco and drug of abuse.                           Allergies:   Allergies  Allergen Reactions  . Sulfonamide Derivatives Swelling    Labs:  Results for orders placed or performed during the hospital encounter of 04/07/15 (from the past 48 hour(s))  Clostridium Difficile by PCR     Status: None   Collection Time: 04/08/15 11:10 PM  Result Value Ref Range   C difficile by pcr NEGATIVE NEGATIVE  Basic metabolic panel     Status: Abnormal   Collection Time: 04/09/15  4:05 AM  Result Value Ref Range   Sodium 142 135 - 145 mmol/L   Potassium 2.7 (LL) 3.5 - 5.1 mmol/L    Comment: RESULT REPEATED AND VERIFIED DELTA CHECK NOTED CRITICAL RESULT CALLED TO, READ BACK BY AND VERIFIED WITH: Margreta Journey RN @ [phone] ON 04/09/15 BY C DAVIS    Chloride 99 (L) 101 - 111 mmol/L   CO2 33 (H) 22 - 32 mmol/L   Glucose, Bld 121 (H) 70 - 99 mg/dL   BUN 9 6 - 20 mg/dL   Creatinine, Ser 3.05 0.44 - 1.00 mg/dL   Calcium 8.4 (L) 8.9 - 10.3 mg/dL   GFR calc non Af Amer 57 (L) >60 mL/min   GFR calc Af Amer >60 >60 mL/min    Comment: (NOTE) The eGFR has been calculated using the CKD EPI equation. This calculation has not been validated in all clinical situations. eGFR's persistently <60 mL/min signify possible Chronic Kidney Disease.    Anion gap 10 5 - 15  CBC     Status: Abnormal   Collection Time: 04/09/15  4:05 AM  Result Value Ref Range   WBC 7.9 4.0 - 10.5 K/uL   RBC 3.80 (L)  3.87 - 5.11 MIL/uL   Hemoglobin 11.8 (L) 12.0 - 15.0 g/dL   HCT 61.6 (L) 21.6 - 98.3 %   MCV 91.1 78.0 - 100.0 fL   MCH 31.1 26.0 - 34.0 pg   MCHC 34.1 30.0 - 36.0 g/dL   RDW 97.5 94.6 - 73.0 %   Platelets 206 150 - 400 K/uL  Magnesium     Status: None   Collection Time: 04/09/15  4:05 AM  Result Value Ref Range   Magnesium 1.8 1.7 - 2.4 mg/dL  Glucose, capillary     Status: None  Collection Time: 04/09/15  5:42 AM  Result Value Ref Range   Glucose-Capillary 96 70 - 99 mg/dL  Basic metabolic panel     Status: Abnormal   Collection Time: 04/10/15  4:35 AM  Result Value Ref Range   Sodium 143 135 - 145 mmol/L   Potassium 3.5 3.5 - 5.1 mmol/L    Comment: DELTA CHECK NOTED REPEATED TO VERIFY    Chloride 110 101 - 111 mmol/L   CO2 24 22 - 32 mmol/L   Glucose, Bld 121 (H) 65 - 99 mg/dL   BUN 7 6 - 20 mg/dL   Creatinine, Ser 0.98 0.44 - 1.00 mg/dL   Calcium 8.6 (L) 8.9 - 10.3 mg/dL   GFR calc non Af Amer 57 (L) >60 mL/min   GFR calc Af Amer >60 >60 mL/min    Comment: (NOTE) The eGFR has been calculated using the CKD EPI equation. This calculation has not been validated in all clinical situations. eGFR's persistently <60 mL/min signify possible Chronic Kidney Disease.    Anion gap 9 5 - 15  CBC     Status: Abnormal   Collection Time: 04/10/15  4:35 AM  Result Value Ref Range   WBC 6.8 4.0 - 10.5 K/uL   RBC 3.62 (L) 3.87 - 5.11 MIL/uL   Hemoglobin 11.3 (L) 12.0 - 15.0 g/dL   HCT 34.1 (L) 36.0 - 46.0 %   MCV 94.2 78.0 - 100.0 fL   MCH 31.2 26.0 - 34.0 pg   MCHC 33.1 30.0 - 36.0 g/dL   RDW 13.5 11.5 - 15.5 %   Platelets 202 150 - 400 K/uL  Glucose, capillary     Status: Abnormal   Collection Time: 04/10/15  4:49 AM  Result Value Ref Range   Glucose-Capillary 105 (H) 65 - 99 mg/dL    Vitals: Blood pressure 106/40, pulse 82, temperature 97.5 F (36.4 C), temperature source Oral, resp. rate 16, height $RemoveBe'5\' 1"'thNSJLEYr$  (1.549 m), weight 58.423 kg (128 lb 12.8 oz), SpO2 100 %.  Risk  to Self: Is patient at risk for suicide?: No Risk to Others:   Prior Inpatient Therapy:   Prior Outpatient Therapy:    Current Facility-Administered Medications  Medication Dose Route Frequency Provider Last Rate Last Dose  . acetaminophen (TYLENOL) tablet 650 mg  650 mg Oral Q4H PRN Janece Canterbury, MD       Or  . acetaminophen (TYLENOL) suppository 650 mg  650 mg Rectal Q4H PRN Janece Canterbury, MD      . antiseptic oral rinse (CPC / CETYLPYRIDINIUM CHLORIDE 0.05%) solution 7 mL  7 mL Mouth Rinse BID Janece Canterbury, MD   7 mL at 04/10/15 0800  . aspirin suppository 300 mg  300 mg Rectal Daily Janece Canterbury, MD       Or  . aspirin tablet 325 mg  325 mg Oral Daily Janece Canterbury, MD   325 mg at 04/10/15 1400  . buPROPion (WELLBUTRIN XL) 24 hr tablet 150 mg  150 mg Oral Daily Janece Canterbury, MD   150 mg at 04/10/15 0954  . cycloSPORINE (RESTASIS) 0.05 % ophthalmic emulsion 1 drop  1 drop Both Eyes BID Janece Canterbury, MD   1 drop at 04/10/15 0955  . heparin injection 5,000 Units  5,000 Units Subcutaneous 3 times per day Janece Canterbury, MD   5,000 Units at 04/10/15 1400  . hyoscyamine (LEVBID) 0.375 MG 12 hr tablet 0.375 mg  0.375 mg Oral Q12H PRN Janece Canterbury, MD      .  LORazepam (ATIVAN) tablet 1 mg  1 mg Oral Q6H PRN Renae Fickle, MD       Or  . LORazepam (ATIVAN) injection 1 mg  1 mg Intravenous Q6H PRN Renae Fickle, MD      . LORazepam (ATIVAN) tablet 0-4 mg  0-4 mg Oral Q12H Renae Fickle, MD   0 mg at 04/09/15 1800  . multivitamin with minerals tablet 1 tablet  1 tablet Oral Daily Renae Fickle, MD   1 tablet at 04/10/15 1400  . olopatadine (PATANOL) 0.1 % ophthalmic solution 1 drop  1 drop Both Eyes BID Renae Fickle, MD   1 drop at 04/10/15 0955  . pantoprazole (PROTONIX) EC tablet 40 mg  40 mg Oral Daily Renae Fickle, MD   40 mg at 04/10/15 0954  . SUMAtriptan (IMITREX) tablet 100 mg  100 mg Oral Q2H PRN Renae Fickle, MD      . topiramate (TOPAMAX) tablet  100 mg  100 mg Oral BID Renae Fickle, MD   100 mg at 04/10/15 2026    Musculoskeletal: Strength & Muscle Tone: within normal limits Gait & Station: unable to stand Patient leans: N/A  Psychiatric Specialty Exam: Physical Exam   ROS,   Blood pressure 106/40, pulse 82, temperature 97.5 F (36.4 C), temperature source Oral, resp. rate 16, height 5\' 1"  (1.549 m), weight 58.423 kg (128 lb 12.8 oz), SpO2 100 %.Body mass index is 24.35 kg/(m^2).  General Appearance: Casual  Eye Contact::  Good  Speech:  Clear and Coherent  Volume:  Normal  Mood:  Depressed  Affect:  Appropriate and Congruent  Thought Process:  Coherent, Goal Directed and Logical  Orientation:  Full (Time, Place, and Person)  Thought Content:  Rumination  Suicidal Thoughts:  No  Homicidal Thoughts:  No  Memory:  Immediate;   Fair Recent;   Fair  Judgement:  Fair  Insight:  Fair  Psychomotor Activity:  Decreased  Concentration:  Fair  Recall:  Poor  Fund of Knowledge:Fair  Language: Good  Akathisia:  Negative  Handed:  Right  AIMS (if indicated):     Assets:  Communication Skills Desire for Improvement Financial Resources/Insurance Housing Intimacy Leisure Time Resilience Social Support Talents/Skills Transportation  ADL's:  Impaired  Cognition: Impaired,  Mild  Sleep:      Medical Decision Making: New problem, with additional work up planned, Review of Psycho-Social Stressors (1), Review or order clinical lab tests (1), Review of Last Therapy Session (1), Review or order medicine tests (1), Review of Medication Regimen & Side Effects (2) and Review of New Medication or Change in Dosage (2)  Treatment Plan Summary: Daily contact with patient to assess and evaluate symptoms and progress in treatment, Medication management and Plan Review of recent medication from out patient providers, refer to psych social service for obtaining medical records and colleteral information  Plan: Patient has been stable  with her current medication management and less confused, forgetful, and worried. Agree with psych medication changes made during this hospitalization, as she has been on polypharmacy and maximum doses.   Benzo withdrawal: Monitor for CIWA to prevent withdrawal seizures  Depression: Wellbutrin XL 150 mg daily, which does not cause sedation  Migraine headache: agree with Topamax 100 mg PO BID from 200 mg BID which may be reduced further due to common neurological side effects like dizziness, paresthesia, somnolence, cognittive dysfunction, tremors, ataxias, diplopia etc if needed.   Avoid or minimize opioids and benzo due to increased sedation and falls.  Patient does not meet criteria for psychiatric inpatient admission. Supportive therapy provided about ongoing stressors.   Appreciate psychiatric consultation and we sign off at this time Please contact [phone] or [phone] if needs further assistance  Disposition: May go home when medically stable, family is supportive and has out patient services.   Devlin Mcveigh,JANARDHAHA R. 04/10/2015 2:03 PM

## 2015-04-10 NOTE — Progress Notes (Signed)
Occupational Therapy Treatment Patient Details Name: Danielle Gaines MRN: [id number] DOB: 1940/12/31 Today's Date: 04/10/2015    History of present illness Pt is a 74 year old female admitted for falls and slurred speech.  She has a h/o recurrent falls, chronic benzodiazapine use, migraines, and depression.  CT scan and MRI of the brain with no signs of acute stroke   OT comments  Pt making good progress.  Continued min guard for safety:  No dizziness present but pt slightly unsteady without LOB  Follow Up Recommendations  Home health OT;Supervision/Assistance - 24 hour    Equipment Recommendations  3 in 1 bedside comode    Recommendations for Other Services      Precautions / Restrictions Precautions Precautions: Fall Restrictions Weight Bearing Restrictions: No       Mobility Bed Mobility Overal bed mobility: Modified Independent             General bed mobility comments: HOB raised  Transfers Overall transfer level: Needs assistance Equipment used: None   Sit to Stand: Min guard         General transfer comment: for safety    Balance                                   ADL       Grooming: Oral care;Standing;Supervision/safety       Lower Body Bathing: Min guard;Sit to/from stand (crossed legs to wash feet)       Lower Body Dressing: Min guard;Sit to/from stand (crossed legs to don socks)   Toilet Transfer: Min guard;Comfort height toilet;Ambulation             General ADL Comments: Pt did not use AD today.  Had a couple of balance checks/slightly unsteady but no LOB.  Pt is feeling better--cued to slow down initially.  Pt does not have dizziness anymore.  Gave her resources for vestibular rehab program, if she needs it in the future      Vision                     Perception     Praxis      Cognition   Behavior During Therapy: [facility] for tasks assessed/performed Overall Cognitive Status: Within Functional Limits  for tasks assessed                       Extremity/Trunk Assessment               Exercises     Shoulder Instructions       General Comments      Pertinent Vitals/ Pain       Pain Assessment:  2 pain, abdomen, worked within [employer] and repositioned  Home Living                                          Prior Functioning/Environment              Frequency Min 2X/week     Progress Toward Goals  OT Goals(current goals can now be found in the care plan section)  Progress towards OT goals: Goals met and updated - see care plan  Acute Rehab OT Goals Time For Goal Achievement: 04/22/15 ADL Goals Pt Will Perform Grooming:  (goal met) Pt  Will Transfer to Toilet: with supervision;ambulating;bedside commode;regular height toilet (vs) Pt Will Perform Toileting - Clothing Manipulation and hygiene: with supervision;sit to/from stand Additional ADL Goal #1: Pt will gather ADL supplies at supervision level and complete ADL without assistance/guarding Additional ADL Goal #2: discontinued  Plan Discharge plan remains appropriate    Co-evaluation                 End of Session     Activity Tolerance Patient tolerated treatment well   Patient Left in chair;with call bell/phone within reach;with chair alarm set   Nurse Communication          Time: 1246-1310 OT Time Calculation (min): 24 min  Charges: OT General Charges $OT Visit: 1 Procedure OT Treatments $Self Care/Home Management : 8-22 mins $Therapeutic Activity: 8-22 mins  Ryle Buscemi 04/10/2015, 1:24 PM  Lesle Chris, OTR/L [phone] 04/10/2015

## 2015-04-11 ENCOUNTER — Ambulatory Visit: Payer: BLUE CROSS/BLUE SHIELD | Admitting: Family Medicine

## 2015-04-11 LAB — CBC
HCT: 34 % — ABNORMAL LOW (ref 36.0–46.0)
Hemoglobin: 11.3 g/dL — ABNORMAL LOW (ref 12.0–15.0)
MCH: 31.9 pg (ref 26.0–34.0)
MCHC: 33.2 g/dL (ref 30.0–36.0)
MCV: 96 fL (ref 78.0–100.0)
Platelets: 220 10*3/uL (ref 150–400)
RBC: 3.54 MIL/uL — ABNORMAL LOW (ref 3.87–5.11)
RDW: 13.7 % (ref 11.5–15.5)
WBC: 5.8 10*3/uL (ref 4.0–10.5)

## 2015-04-11 LAB — GLUCOSE, CAPILLARY
Glucose-Capillary: 99 mg/dL (ref 65–99)
Glucose-Capillary: 92 mg/dL (ref 65–99)

## 2015-04-11 LAB — BASIC METABOLIC PANEL
Anion gap: 7 (ref 5–15)
BUN: 6 mg/dL (ref 6–20)
CO2: 23 mmol/L (ref 22–32)
Calcium: 9 mg/dL (ref 8.9–10.3)
Chloride: 112 mmol/L — ABNORMAL HIGH (ref 101–111)
Creatinine, Ser: 0.93 mg/dL (ref 0.44–1.00)
GFR calc Af Amer: >60 mL/min (ref >60)
GFR calc non Af Amer: >60 mL/min (ref >60)
Glucose, Bld: 103 mg/dL — ABNORMAL HIGH (ref 65–99)
Potassium: 4 mmol/L (ref 3.5–5.1)
Sodium: 142 mmol/L (ref 135–145)

## 2015-04-11 MED ORDER — TOPIRAMATE 100 MG PO TABS: 100.0000 mg | ORAL_TABLET | Freq: Two times a day (BID) | ORAL | Status: DC

## 2015-04-11 MED ORDER — BUPROPION HCL ER (XL) 150 MG PO TB24: 150.0000 mg | ORAL_TABLET | Freq: Every morning | ORAL | Status: DC

## 2015-04-11 NOTE — Progress Notes (Signed)
Physical Therapy Treatment and Discharge from Acute PT Patient Details Name: Danielle Gaines MRN: [id number] DOB: 17-Dec-1940 Today's Date: 17-Apr-2015    History of Present Illness Pt is a 74 year old female admitted for falls and slurred speech.  She has a h/o recurrent falls, chronic benzodiazapine use, migraines, and depression.  CT scan and MRI of the brain with no signs of acute stroke    PT Comments    Pt mobilizing very well and performed 10 steps without difficulty.  Pt appears more steady and feels ready for d/c home today with spouse.  Pt has met acute care goals and therefore will d/c from acute PT.  Follow Up Recommendations  Supervision for mobility/OOB;Home health PT     Equipment Recommendations  None recommended by PT    Recommendations for Other Services       Precautions / Restrictions Precautions Precautions: Fall Restrictions Weight Bearing Restrictions: No    Mobility  Bed Mobility Overal bed mobility: Modified Independent                Transfers Overall transfer level: Modified independent                  Ambulation/Gait Ambulation/Gait assistance: Supervision;Modified independent (Device/Increase time) Ambulation Distance (Feet): 300 Feet Assistive device: None Gait Pattern/deviations: WFL(Within Functional Limits)     General Gait Details: slow cadence yet steady gait, no LOB   Stairs Stairs: Yes Stairs assistance: Supervision Stair Management: One rail Right;Alternating pattern;Forwards Number of Stairs: 10 General stair comments: no cues or assist required, performed without difficulty  Wheelchair Mobility    Modified Rankin (Stroke Patients Only)       Balance                                    Cognition Arousal/Alertness: Awake/alert Behavior During Therapy: WFL for tasks assessed/performed Overall Cognitive Status: Within Functional Limits for tasks assessed                       Exercises      General Comments        Pertinent Vitals/Pain Pain Assessment: No/denies pain    Home Living                      Prior Function            PT Goals (current goals can now be found in the care plan section) Progress towards PT goals: Goals met/education completed, patient discharged from PT    Frequency       PT Plan Other (comment) (D/C from acute PT)    Co-evaluation             End of Session   Activity Tolerance: Patient tolerated treatment well Patient left: in bed;with call bell/phone within reach;with family/visitor present     Time: 1010-1018 PT Time Calculation (min) (ACUTE ONLY): 8 min  Charges:  $Gait Training: 8-22 mins                    G Codes:      Danielle Gaines,Danielle Gaines 17-Apr-2015, 10:57 AM Danielle Gaines, PT, DPT Apr 17, 2015 Pager: [phone]

## 2015-04-11 NOTE — Discharge Summary (Signed)
Physician Discharge Summary  Danielle Gaines SJG:[id number] DOB: 1941/10/13 DOA: 04/07/2015  PCP: Lucretia Kern., DO  Admit date: 04/07/2015 Discharge date: 04/11/2015  Recommendations for Outpatient Follow-up:  1. Pt will need to follow up with PCP in 2-3 weeks post discharge 2. Please obtain BMP to evaluate electrolytes and kidney function 3. Please also check CBC to evaluate Hg and Hct levels 4. Pleas note changes in dosing of several medications including Wellbutrin and Topamax  5. Linzess stopped due to diarrhea   Discharge Diagnoses:  Principal Problem:   MDD (major depressive disorder), recurrent severe, without psychosis Active Problems:   Migraine - followed by Dr. Sima Matas in Neurology   Anxiety and depression - managed at [facility]   GERD (gastroesophageal reflux disease)   Osteoporosis   Slurred speech   Weakness of left upper extremity   Orthostatic hypotension   AKI (acute kidney injury)   Hypomagnesemia   Stroke  Discharge Condition: Stable  Diet recommendation: Heart healthy diet discussed in details   Brief narrative:    73 y.o. year-old female with history of migraines, IBS, depression, chronic benzodiazepine use followed by psychiatry, osteoporosis who presented with recurrent falls, slurred speech. The patient was last at her baseline health until about a month ago. She lives at home with her husband and previously was independent for all ADLs and walked without an assistive device. For the last couple of weeks, she has felt like she is drunk and has had numerous falls. She states that both of her arms feel somewhat numb and weak, her left worse than her right. She has had several episodes where the room felt off balance and she has fallen. Her last fall was 3 days prior to admission and she fell and hit her head sharply on the edge of a chair. She denies loss of consciousness but since that time she has had even worse weakness of the left arm, slurred speech, and  some confusion. She has intermittent chronic diarrhea which is unchanged. She also has intermittent chronic nausea with vomiting which is also unchanged. She denied recent fevers, chills, sinus congestion, sore throat, cough. She has chronic dysuria and has had tests for urinary tract infection which have been negative by her primary care doctor. She asked her primary care doctor to taper her off of her benzodiazepines, process which was started a couple months ago. She was started on Adderall during the day to keep her awake because she felt like she was oversedated. She has not been eating or drinking as much particularly over the last week.  Assessment/Plan:    Recurrent falls, slurred speech - CT scan and MRI of the brain with no signs of acute stroke - orthostatics positive on admission, repeat orthostatic WNL - No significant change in neurological status since admission, slurred speech now resolved  - Patient has been on sedating medications in the past which could have certainly contributed to episodes of fall - Her medical regimen currently includes Wellbutrin, Cymbalta, Librium, Topamax, adderall  -Carotid duplex and ECHO with no acute findings to explain the falls  - appreciate psychiatrist recommendations to continue Wellbutrin XL 150 mg PO QD, which does not cause sedation  -Continue to hold sedating medications - no signs of withdrawal while inpatient   Depression/anxiety - halved wellbutrin dose - appreciate psychiatrist recommendations   Chronic benzodiazepine use - trying to wean herself off benzos - no withdrawal while inpatient  - no signs of withdrawal since admission   Migraine headaches -  Topamax 100 mg PO BID, halved from 200 mg BID and may be reduced further due to common neurological side effects like dizziness, paresthesia, somnolence, cognittive dysfunction, tremors, ataxias, diplopia etc if needed.   IBS - stable, continue pancrelipase, hyoscyamine -  better   Diarrhea - C. Diff negative - linzess stopped and diarrhea is improving   GERD - stable, continue PPI  Osteoporosis - stable, hold alendronate  Hyperglycemia - A1c 5.6, no need for antihyperglycemic regimen upon discharge  Acute kidney injury, hypokalemia and elevated hemoglobin suggest dehydration. Orthostatics positive - responding to IVF, Cr is trending down from 1.8 --> 1.18 --> 0.97   Code Status: DNR Family Communication: plan of care discussed with the patient and husband at bedside Disposition Plan: Wants to go home today with husband   IV access:  Peripheral IV  Procedures and diagnostic studies:   Ct Head Wo Contrast 04/07/2015 Normal head CT for age.   Mr Virgel Paling Wo Contrast 04/07/2015 Normal MRI of the brain for a person of this age. No acute insult. Normal intracranial MR angiography of the large and medium size vessels.   al MRI of the brain for a person of this age. No acute insult. Normal intracranial MR angiography of the large and medium size vessels. Electronically Signed By: Nelson Chimes M.D. On: 04/07/2015 12:28   Dg Chest Port 1 View 04/07/2015 No active disease. Mild hyperinflation.   Medical Consultants:  Psych  Other Consultants:  PT/OT/SLP  IAnti-Infectives:   None        Discharge Exam: Filed Vitals:   04/11/15 0449  BP: 100/40  Pulse: 79  Temp: 97.8 F (36.6 C)  Resp: 16   Filed Vitals:   04/10/15 0455 04/10/15 1313 04/10/15 2044 04/11/15 0449  BP: 91/36 106/40 101/54 100/40  Pulse: 75 82 72 79  Temp: 98.1 F (36.7 C) 97.5 F (36.4 C) 97.8 F (36.6 C) 97.8 F (36.6 C)  TempSrc: Oral Oral Oral Oral  Resp: 18 16 16 16   Height:      Weight:      SpO2: 100% 100% 100% 97%    General: Pt is alert, follows commands appropriately, not in acute distress Cardiovascular: Regular rate and rhythm,  no rubs, no gallops Respiratory: Clear to auscultation bilaterally, no wheezing, no  crackles, no rhonchi Abdominal: Soft, non tender, non distended, bowel sounds +, no guarding Extremities: no edema, no cyanosis, pulses palpable bilaterally DP and PT Neuro: Grossly nonfocal  Discharge Instructions  Discharge Instructions    Diet - low sodium heart healthy    Complete by:  As directed      Increase activity slowly    Complete by:  As directed             Medication List    STOP taking these medications        chlordiazePOXIDE 10 MG capsule  Commonly known as:  LIBRIUM     CYMBALTA 60 MG capsule  Generic drug:  DULoxetine     Linaclotide 290 MCG Caps capsule  Commonly known as:  LINZESS      TAKE these medications        alendronate 70 MG tablet  Commonly known as:  FOSAMAX  Take 1 tablet (70 mg total) by mouth once a week.     ALIGN PO  Take by mouth daily.     BIOTIN PO  Take by mouth daily.     buPROPion 150 MG 24 hr tablet  Commonly known  as:  WELLBUTRIN XL  Take 1 tablet (150 mg total) by mouth every morning.     calcium carbonate 600 MG Tabs tablet  Commonly known as:  OS-CAL  Take 600 mg by mouth 2 (two) times daily with a meal.     CENTRUM SILVER PO  Take 1 tablet by mouth daily.     cholecalciferol 400 UNITS Tabs tablet  Commonly known as:  VITAMIN D  Take 400 Units by mouth.     cycloSPORINE 0.05 % ophthalmic emulsion  Commonly known as:  RESTASIS  Place 1 drop into both eyes 2 (two) times daily.     dexlansoprazole 60 MG capsule  Commonly known as:  DEXILANT  Take 60 mg by mouth daily.     hyoscyamine 0.375 MG 12 hr tablet  Commonly known as:  LEVBID  Take 1 tablet (0.375 mg total) by mouth 2 (two) times daily.     PATADAY 0.2 % Soln  Generic drug:  Olopatadine HCl  Place 1 drop into both eyes 2 (two) times daily as needed. Use one time a day, then again if needed for dry eyes     SUMAtriptan 100 MG tablet  Commonly known as:  IMITREX  Take 100 mg by mouth every 2 (two) hours as needed for migraine or headache. May  repeat in 2 hours if headache persists or recurs.     topiramate 100 MG tablet  Commonly known as:  TOPAMAX  Take 1 tablet (100 mg total) by mouth 2 (two) times daily.     ZENPEP PO  Take 3 capsules by mouth 3 (three) times daily after meals.           Follow-up Information    Follow up with Colin Benton R., DO. Go on 04/24/2015.   Specialty:  Family Medicine   Why:  at 1:30 pm, for hospital follow up.    Contact information:   [place] [address] [phone]       Follow up with Colin Benton R., DO.   Specialty:  Family Medicine   Contact information:   [place] [address] [phone]       Follow up with Colin Benton R., DO.   Specialty:  Family Medicine   Contact information:   [place] [address] [phone]       Call Faye Ramsay, MD.   Specialty:  Internal Medicine   Why:  As needed   Contact information:   [address] [place] [address] [phone]        The results of significant diagnostics from this hospitalization (including imaging, microbiology, ancillary and laboratory) are listed below for reference.     Microbiology: Recent Results (from the past 240 hour(s))  Clostridium Difficile by PCR     Status: None   Collection Time: 04/08/15 11:10 PM  Result Value Ref Range Status   C difficile by pcr NEGATIVE NEGATIVE Final     Labs: Basic Metabolic Panel:  Recent Labs Lab 04/07/15 1137 04/08/15 0530 04/09/15 0405 04/10/15 0435 04/11/15 0435  NA 136 139 142 143 142  K 2.8* 3.3* 2.7* 3.5 4.0  CL 117* 110 99* 110 112*  CO2 12* 21* 33* 24 23  GLUCOSE 119* 135* 121* 121* 103*  BUN 22* 16 9 7 6   CREATININE 1.69* 1.18* 0.97 0.98 0.93  CALCIUM 9.3 8.5* 8.4* 8.6* 9.0  MG 2.2  --  1.8  --   --  Liver Function Tests:  Recent Labs Lab 04/07/15 1137  AST 29  ALT 47  ALKPHOS 80  BILITOT 0.9  PROT 7.3  ALBUMIN 4.0   No  results for input(s): LIPASE, AMYLASE in the last 168 hours. No results for input(s): AMMONIA in the last 168 hours. CBC:  Recent Labs Lab 04/07/15 1137 04/08/15 0530 04/09/15 0405 04/10/15 0435 04/11/15 0435  WBC 13.3* 6.4 7.9 6.8 5.8  NEUTROABS 10.9*  --   --   --   --   HGB 15.6* 11.9* 11.8* 11.3* 11.3*  HCT 45.1 33.4* 34.6* 34.1* 34.0*  MCV 91.1 90.5 91.1 94.2 96.0  PLT 259 219 206 202 220   Cardiac Enzymes:  Recent Labs Lab 04/07/15 1137  CKTOTAL 98   BNP: BNP (last 3 results) No results for input(s): BNP in the last 8760 hours.  ProBNP (last 3 results) No results for input(s): PROBNP in the last 8760 hours.  CBG:  Recent Labs Lab 04/08/15 0851 04/09/15 0542 04/10/15 0449 04/11/15 0451 04/11/15 0807  GLUCAP 136* 96 105* 99 92     SIGNED: Time coordinating discharge: Over 30 minutes  MAGICK-Jaymee Tilson, MD  [facility] 04/11/2015, 10:22 AM Pager [phone]  If 7PM-7AM, please contact night-coverage [email or web address] Password [email or web address]

## 2015-04-11 NOTE — Discharge Instructions (Signed)
Dehydration, Adult Dehydration is when you lose more fluids from the body than you take in. Vital organs like the kidneys, brain, and heart cannot function without a proper amount of fluids and salt. Any loss of fluids from the body can cause dehydration.  CAUSES   Vomiting.  Diarrhea.  Excessive sweating.  Excessive urine output.  Fever. SYMPTOMS  Mild dehydration  Thirst.  Dry lips.  Slightly dry mouth. Moderate dehydration  Very dry mouth.  Sunken eyes.  Skin does not bounce back quickly when lightly pinched and released.  Dark urine and decreased urine production.  Decreased tear production.  Headache. Severe dehydration  Very dry mouth.  Extreme thirst.  Rapid, weak pulse (more than 100 beats per minute at rest).  Cold hands and feet.  Not able to sweat in spite of heat and temperature.  Rapid breathing.  Blue lips.  Confusion and lethargy.  Difficulty being awakened.  Minimal urine production.  No tears. DIAGNOSIS  Your caregiver will diagnose dehydration based on your symptoms and your exam. Blood and urine tests will help confirm the diagnosis. The diagnostic evaluation should also identify the cause of dehydration. TREATMENT  Treatment of mild or moderate dehydration can often be done at home by increasing the amount of fluids that you drink. It is best to drink small amounts of fluid more often. Drinking too much at one time can make vomiting worse. Refer to the home care instructions below. Severe dehydration needs to be treated at the hospital where you will probably be given intravenous (IV) fluids that contain water and electrolytes. HOME CARE INSTRUCTIONS   Ask your caregiver about specific rehydration instructions.  Drink enough fluids to keep your urine clear or pale yellow.  Drink small amounts frequently if you have nausea and vomiting.  Eat as you normally do.  Avoid:  Foods or drinks high in sugar.  Carbonated  drinks.  Juice.  Extremely hot or cold fluids.  Drinks with caffeine.  Fatty, greasy foods.  Alcohol.  Tobacco.  Overeating.  Gelatin desserts.  Wash your hands well to avoid spreading bacteria and viruses.  Only take over-the-counter or prescription medicines for pain, discomfort, or fever as directed by your caregiver.  Ask your caregiver if you should continue all prescribed and over-the-counter medicines.  Keep all follow-up appointments with your caregiver. SEEK MEDICAL CARE IF:  You have abdominal pain and it increases or stays in one area (localizes).  You have a rash, stiff neck, or severe headache.  You are irritable, sleepy, or difficult to awaken.  You are weak, dizzy, or extremely thirsty. SEEK IMMEDIATE MEDICAL CARE IF:   You are unable to keep fluids down or you get worse despite treatment.  You have frequent episodes of vomiting or diarrhea.  You have blood or green matter (bile) in your vomit.  You have blood in your stool or your stool looks black and tarry.  You have not urinated in 6 to 8 hours, or you have only urinated a small amount of very dark urine.  You have a fever.  You faint. MAKE SURE YOU:   Understand these instructions.  Will watch your condition.  Will get help right away if you are not doing well or get worse. Document Released: 11/15/2005 Document Revised: 02/07/2012 Document Reviewed: 07/05/2011 Exit[employer] Patient Information 2015 [employer]. This information is not intended to replace advice given to you by your health care provider. Make sure you discuss any questions you have with your health care   provider.  

## 2015-04-14 ENCOUNTER — Other Ambulatory Visit: Payer: Self-pay

## 2015-04-14 MED ORDER — HYOSCYAMINE SULFATE ER 0.375 MG PO TB12: 0.3750 mg | ORAL_TABLET | Freq: Two times a day (BID) | ORAL | Status: DC

## 2015-04-24 ENCOUNTER — Ambulatory Visit: Payer: Medicare Other | Admitting: Family Medicine

## 2015-05-02 ENCOUNTER — Ambulatory Visit (INDEPENDENT_AMBULATORY_CARE_PROVIDER_SITE_OTHER): Payer: BLUE CROSS/BLUE SHIELD | Admitting: Family Medicine

## 2015-05-02 ENCOUNTER — Encounter: Payer: Self-pay | Admitting: Family Medicine

## 2015-05-02 VITALS — BP 128/82 | HR 104 | Temp 97.7°F | Ht 61.0 in | Wt 130.8 lb

## 2015-05-02 DIAGNOSIS — F32A Depression, unspecified: Secondary | ICD-10-CM

## 2015-05-02 DIAGNOSIS — R300 Dysuria: Secondary | ICD-10-CM

## 2015-05-02 DIAGNOSIS — F418 Other specified anxiety disorders: Secondary | ICD-10-CM

## 2015-05-02 DIAGNOSIS — Z79899 Other long term (current) drug therapy: Secondary | ICD-10-CM | POA: Diagnosis not present

## 2015-05-02 DIAGNOSIS — F419 Anxiety disorder, unspecified: Secondary | ICD-10-CM

## 2015-05-02 DIAGNOSIS — N179 Acute kidney failure, unspecified: Secondary | ICD-10-CM | POA: Diagnosis not present

## 2015-05-02 DIAGNOSIS — F329 Major depressive disorder, single episode, unspecified: Secondary | ICD-10-CM

## 2015-05-02 LAB — CBC
HCT: 43.3 % (ref 36.0–46.0)
Hemoglobin: 14.1 g/dL (ref 12.0–15.0)
MCHC: 32.5 g/dL (ref 30.0–36.0)
MCV: 96.4 fl (ref 78.0–100.0)
Platelets: 347 10*3/uL (ref 150.0–400.0)
RBC: 4.49 Mil/uL (ref 3.87–5.11)
RDW: 13.3 % (ref 11.5–15.5)
WBC: 9.6 10*3/uL (ref 4.0–10.5)

## 2015-05-02 LAB — BASIC METABOLIC PANEL
BUN: 9 mg/dL (ref 6–23)
CO2: 26 mEq/L (ref 19–32)
Calcium: 11.1 mg/dL — ABNORMAL HIGH (ref 8.4–10.5)
Chloride: 108 mEq/L (ref 96–112)
Creatinine, Ser: 1.14 mg/dL (ref 0.40–1.20)
GFR: 49.83 mL/min — ABNORMAL LOW (ref >60.00)
Glucose, Bld: 104 mg/dL — ABNORMAL HIGH (ref 70–99)
Potassium: 4.8 mEq/L (ref 3.5–5.1)
Sodium: 142 mEq/L (ref 135–145)

## 2015-05-02 LAB — POCT URINALYSIS DIPSTICK
Bilirubin, UA: NEGATIVE
Blood, UA: NEGATIVE
Glucose, UA: NEGATIVE
Ketones, UA: NEGATIVE
Nitrite, UA: NEGATIVE
Protein, UA: NEGATIVE
Spec Grav, UA: 1.01
Urobilinogen, UA: 0.2
pH, UA: 6

## 2015-05-02 NOTE — Progress Notes (Signed)
Pre visit review using our clinic review tool, if applicable. No additional management support is needed unless otherwise documented below in the visit note. 

## 2015-05-02 NOTE — Patient Instructions (Addendum)
BEFORE YOU LEAVE: -labs -schedule Annual wellness exam here in 2-4 months and as needed  Consider counseling for your depression - call Dr. Glennon Hamilton if you decide to do this. Follow up with your psychiatrist - be cautious with any medications.  Follow up with your neurologist as well.

## 2015-05-02 NOTE — Progress Notes (Signed)
HPI:  Danielle Gaines is a pleasant 74 yo new patient to our practice recently with a very challenging and complicated pmh. She struggles with depression, migraines, IBS and chronic pain and  sees several specialists and was on many medications. At her last visit with me I advised concerns for polypharmacy and advised she work with her specialists to reduce medications and advised she see her neurologist for memory and gait issues. She recently was hospitalized and is here for hospital follow up.  Per discharge documents and pt:  Hospitalized 5/9-5/13/16 for her depression: -presenting symptoms were 4 weeks of balance, falls and speech issues  -had extensive eval and per hospital records, thought to be related to her medicationswith CT, MRI/MRA head, carotid duplex, echo and labs unrevealing -per notes and our last visit - her psychiatrist (NOT pcp as discharge notes state) was weaning her off benzos and had her on adderall and librium along with wellbutrin and Cymbalta. She also takes topamax from her neurologist for headaches. -notes state wellbutrin and topamax dosing changed and linzess stopped - appears benzo, librium, cymbalta also held in the hospital -she has IBS with chronic diarrhea and nausea, per notes was dehydrated with AKI on admission which resolved with IVFs and stopping linzess -Hgb drop during stay likely dilutional from IVFs looking at other cell lines -her primary concern today is dysuria - this is chronic for her - reports her urine is cloudy, has some burning with urination, frequency and urgency -speech and gait issues have resolved with the medication changes -she is seeing her psychiatrist and is concerned about adding any medications for her chronic depression - persistent, but without severe depression or SI or thoughts of self harm -denies: changes in HAs, persistent issues with gait or speech, nausea or vomiting, blood in stools, fevers, flank pain, blood in urine,  bleeding, bowel changes, diarrhea, worsening depression, SI  ROS: See pertinent positives and negatives per HPI.  Past Medical History  Diagnosis Date  . Osteoporosis   . Migraine     managed by neurologist  . Depression     managed by Dr. Toy Care  . Benzodiazepine dependence   . IBS (irritable bowel syndrome)     sees Dr. Earlean Shawl  . Colon abnormality     didn't work right so part of it was removed at [facility]  . GERD (gastroesophageal reflux disease)   . Diastolic heart failure     mild on echo 2016  . Carotid arterial disease     mild  . Chronic pain     managed by pain clinic  . Macular degeneration     goes to [facility] for this    Past Surgical History  Procedure Laterality Date  . Appendectomy    . Cholecystectomy    . Abdominal hysterectomy    . Abdominal adhesion surgery    . Small intestine surgery    . Colon surgery    . Ovarian cyst surgery      Family History  Problem Relation Age of Onset  . Kidney disease Mother   . Heart disease Mother   . Cancer Maternal Aunt     intestinal and liver cancer  . Stroke Maternal Grandmother   . Stroke    . Depression      everyone    History   Social History  . Marital Status: Married    Spouse Name: N/A  . Number of Children: N/A  . Years of Education: N/A  Social History Main Topics  . Smoking status: Never Smoker   . Smokeless tobacco: Never Used  . Alcohol Use: No  . Drug Use: No  . Sexual Activity: No   Other Topics Concern  . None   Social History Narrative   Work or School: none      Home Situation: lives with husband      Spiritual Beliefs:       Lifestyle: no regular exercise; diet is ok              Current outpatient prescriptions:  .  alendronate (FOSAMAX) 70 MG tablet, Take 1 tablet (70 mg total) by mouth once a week., Disp: 4 tablet, Rfl: 11 .  BIOTIN PO, Take by mouth daily., Disp: , Rfl:  .  buPROPion (WELLBUTRIN XL) 150 MG 24 hr tablet, Take 1 tablet (150 mg total) by  mouth every morning., Disp: 30 tablet, Rfl: 0 .  calcium carbonate (OS-CAL) 600 MG TABS tablet, Take 600 mg by mouth 2 (two) times daily with a meal., Disp: , Rfl:  .  cholecalciferol (VITAMIN D) 400 UNITS TABS tablet, Take 400 Units by mouth., Disp: , Rfl:  .  cycloSPORINE (RESTASIS) 0.05 % ophthalmic emulsion, Place 1 drop into both eyes 2 (two) times daily. , Disp: , Rfl:  .  hyoscyamine (LEVBID) 0.375 MG 12 hr tablet, Take 1 tablet (0.375 mg total) by mouth 2 (two) times daily., Disp: 60 tablet, Rfl: 1 .  Multiple Vitamins-Minerals (CENTRUM SILVER PO), Take 1 tablet by mouth daily. , Disp: , Rfl:  .  Pancrelipase, Lip-Prot-Amyl, (ZENPEP PO), Take 3 capsules by mouth 3 (three) times daily after meals. , Disp: , Rfl:  .  pantoprazole (PROTONIX) 40 MG tablet, , Disp: , Rfl: 0 .  PATADAY 0.2 % SOLN, Place 1 drop into both eyes 2 (two) times daily as needed. Use one time a day, then again if needed for dry eyes, Disp: , Rfl: 0 .  Probiotic Product (ALIGN PO), Take by mouth daily., Disp: , Rfl:  .  SUMAtriptan (IMITREX) 100 MG tablet, Take 100 mg by mouth every 2 (two) hours as needed for migraine or headache. May repeat in 2 hours if headache persists or recurs., Disp: , Rfl:  .  topiramate (TOPAMAX) 100 MG tablet, Take 1 tablet (100 mg total) by mouth 2 (two) times daily., Disp: 60 tablet, Rfl: 1  EXAM:  Filed Vitals:   05/02/15 1048  BP: 128/82  Pulse: 104  Temp: 97.7 F (36.5 C)    Body mass index is 24.73 kg/(m^2).  GENERAL: vitals reviewed and listed above, alert, oriented, appears well hydrated and in no acute distress  HEENT: atraumatic, conjunttiva clear, no obvious abnormalities on inspection of external nose and ears  NECK: no obvious masses on inspection  LUNGS: clear to auscultation bilaterally, no wheezes, rales or rhonchi, good air movement  CV: HRRR, no peripheral edema  ABD: BS+, soft, NTTP  MS: moves all extremities without noticeable abnormality  PSYCH:  pleasant and cooperative, no obvious depression or anxiety  ASSESSMENT AND PLAN:  Discussed the following assessment and plan:  Dysuria - Plan: POC Urinalysis Dipstick, CBC (no diff), Culture, Urine -udip with leuks but o/w normal, opted to obtain Ucx and tx if infection, if no infection and persistent symptoms she opted to see urologist  Acute renal failure, unspecified acute renal failure type - Plan: Basic metabolic panel -in hospital, discharge summary advises recheck  Anxiety and depression - managed at [facility] -  she is anxious about medications, advised she request non-pharmacological treatments for her depression and if any changes in pharmacological tx to do so slowly with avoidance of multiple medications with interactions. Made her aware of services offered by Dr. Glennon Hamilton here (our psychologist) and provided her information to schedule an appointment if she wishes. Advised follow up with her neurologist treating her migraines and in terms of her other admitting symptoms, though these seem to have resolved with changes/reduciton in medications.  Polypharmacy - Plan: CBC (no diff) -improving  -Patient advised to return or notify a doctor immediately if symptoms worsen or persist or new concerns arise.  Patient Instructions  BEFORE YOU LEAVE: -labs -schedule Annual wellness exam here in 2-4 months and as needed  Consider counseling for your depression - call Dr. Glennon Hamilton if you decide to do this. Follow up with your psychiatrist - be cautious with any medications.  Follow up with your neurologist as well.       Colin Benton R.

## 2015-05-03 LAB — URINE CULTURE: Colony Count: 50000

## 2015-05-05 NOTE — Addendum Note (Signed)
Addended by: Agnes Lawrence on: 05/05/2015 05:01 PM   Modules accepted: Orders

## 2015-05-06 ENCOUNTER — Other Ambulatory Visit: Payer: Self-pay | Admitting: *Deleted

## 2015-05-06 DIAGNOSIS — R300 Dysuria: Secondary | ICD-10-CM

## 2015-05-07 ENCOUNTER — Other Ambulatory Visit: Payer: Medicare Other

## 2015-05-07 DIAGNOSIS — R300 Dysuria: Secondary | ICD-10-CM

## 2015-05-09 LAB — URINE CULTURE: Colony Count: 15000

## 2015-05-19 ENCOUNTER — Other Ambulatory Visit: Payer: Self-pay | Admitting: Family Medicine

## 2015-05-19 NOTE — Telephone Encounter (Signed)
Can you make sure she comes back to check the calcium? Then, ok to refill. Thanks.

## 2015-05-20 ENCOUNTER — Other Ambulatory Visit (INDEPENDENT_AMBULATORY_CARE_PROVIDER_SITE_OTHER): Payer: BLUE CROSS/BLUE SHIELD

## 2015-05-20 NOTE — Telephone Encounter (Signed)
Rx done. Patient has lab appt 6/21.

## 2015-05-21 ENCOUNTER — Other Ambulatory Visit: Payer: Self-pay | Admitting: *Deleted

## 2015-05-21 LAB — CALCIUM, IONIZED: Calcium, Ion: 1.43 mmol/L — ABNORMAL HIGH (ref 1.12–1.32)

## 2015-05-22 ENCOUNTER — Other Ambulatory Visit (INDEPENDENT_AMBULATORY_CARE_PROVIDER_SITE_OTHER): Payer: BLUE CROSS/BLUE SHIELD

## 2015-05-23 LAB — PTH, INTACT AND CALCIUM
Calcium: 10.4 mg/dL (ref 8.4–10.5)
PTH: 16 pg/mL (ref 14–64)

## 2015-06-13 ENCOUNTER — Emergency Department (HOSPITAL_COMMUNITY)
Admission: EM | Admit: 2015-06-13 | Discharge: 2015-06-13 | Disposition: A | Discharge status: Home / Self Care | Payer: BLUE CROSS/BLUE SHIELD | Attending: Emergency Medicine | Admitting: Emergency Medicine

## 2015-06-13 ENCOUNTER — Encounter (HOSPITAL_COMMUNITY): Payer: Self-pay

## 2015-06-13 ENCOUNTER — Other Ambulatory Visit: Payer: Self-pay

## 2015-06-13 ENCOUNTER — Emergency Department (HOSPITAL_COMMUNITY): Payer: BLUE CROSS/BLUE SHIELD

## 2015-06-13 DIAGNOSIS — G8929 Other chronic pain: Secondary | ICD-10-CM | POA: Diagnosis not present

## 2015-06-13 DIAGNOSIS — I5030 Unspecified diastolic (congestive) heart failure: Secondary | ICD-10-CM | POA: Diagnosis not present

## 2015-06-13 DIAGNOSIS — M791 Myalgia: Secondary | ICD-10-CM | POA: Insufficient documentation

## 2015-06-13 DIAGNOSIS — M810 Age-related osteoporosis without current pathological fracture: Secondary | ICD-10-CM | POA: Insufficient documentation

## 2015-06-13 DIAGNOSIS — M2550 Pain in unspecified joint: Secondary | ICD-10-CM | POA: Insufficient documentation

## 2015-06-13 DIAGNOSIS — F329 Major depressive disorder, single episode, unspecified: Secondary | ICD-10-CM | POA: Diagnosis not present

## 2015-06-13 DIAGNOSIS — R079 Chest pain, unspecified: Secondary | ICD-10-CM | POA: Diagnosis not present

## 2015-06-13 DIAGNOSIS — K219 Gastro-esophageal reflux disease without esophagitis: Secondary | ICD-10-CM | POA: Insufficient documentation

## 2015-06-13 DIAGNOSIS — G43909 Migraine, unspecified, not intractable, without status migrainosus: Secondary | ICD-10-CM | POA: Diagnosis not present

## 2015-06-13 DIAGNOSIS — I2510 Atherosclerotic heart disease of native coronary artery without angina pectoris: Secondary | ICD-10-CM | POA: Insufficient documentation

## 2015-06-13 DIAGNOSIS — Z79899 Other long term (current) drug therapy: Secondary | ICD-10-CM | POA: Insufficient documentation

## 2015-06-13 DIAGNOSIS — R0789 Other chest pain: Secondary | ICD-10-CM | POA: Diagnosis not present

## 2015-06-13 LAB — CBC
HCT: 41.1 % (ref 36.0–46.0)
Hemoglobin: 13.7 g/dL (ref 12.0–15.0)
MCH: 31.7 pg (ref 26.0–34.0)
MCHC: 33.3 g/dL (ref 30.0–36.0)
MCV: 95.1 fL (ref 78.0–100.0)
Platelets: 325 10*3/uL (ref 150–400)
RBC: 4.32 MIL/uL (ref 3.87–5.11)
RDW: 13.2 % (ref 11.5–15.5)
WBC: 8.7 10*3/uL (ref 4.0–10.5)

## 2015-06-13 LAB — BASIC METABOLIC PANEL
Anion gap: 7 (ref 5–15)
BUN: 22 mg/dL — ABNORMAL HIGH (ref 6–20)
CO2: 17 mmol/L — ABNORMAL LOW (ref 22–32)
Calcium: 9 mg/dL (ref 8.9–10.3)
Chloride: 110 mmol/L (ref 101–111)
Creatinine, Ser: 1.08 mg/dL — ABNORMAL HIGH (ref 0.44–1.00)
GFR calc Af Amer: 58 mL/min — ABNORMAL LOW (ref >60)
GFR calc non Af Amer: 50 mL/min — ABNORMAL LOW (ref >60)
Glucose, Bld: 93 mg/dL (ref 65–99)
Potassium: 3.9 mmol/L (ref 3.5–5.1)
Sodium: 134 mmol/L — ABNORMAL LOW (ref 135–145)

## 2015-06-13 LAB — I-STAT TROPONIN, ED: Troponin i, poc: 0.01 ng/mL (ref 0.00–0.08)

## 2015-06-13 LAB — D-DIMER, QUANTITATIVE (NOT AT ARMC): D-Dimer, Quant: <0.27 ug/mL-FEU (ref 0.00–0.48)

## 2015-06-13 MED ORDER — OXYCODONE-ACETAMINOPHEN 5-325 MG PO TABS: 1.0000 | ORAL_TABLET | ORAL | Status: DC | PRN

## 2015-06-13 MED ORDER — MORPHINE SULFATE 4 MG/ML IJ SOLN
2.0000 mg | Freq: Once | INTRAMUSCULAR | Status: AC
  Administered 2015-06-13: 2 mg via INTRAVENOUS
  Filled 2015-06-13: qty 1

## 2015-06-13 MED ORDER — KETOROLAC TROMETHAMINE 30 MG/ML IJ SOLN
30.0000 mg | Freq: Once | INTRAMUSCULAR | Status: AC
  Administered 2015-06-13: 30 mg via INTRAVENOUS
  Filled 2015-06-13: qty 1

## 2015-06-13 MED ORDER — DEXAMETHASONE SODIUM PHOSPHATE 10 MG/ML IJ SOLN
6.0000 mg | Freq: Once | INTRAMUSCULAR | Status: AC
  Administered 2015-06-13: 6 mg via INTRAVENOUS
  Filled 2015-06-13: qty 1

## 2015-06-13 MED ORDER — ONDANSETRON HCL 4 MG/2ML IJ SOLN
4.0000 mg | Freq: Once | INTRAMUSCULAR | Status: AC
  Administered 2015-06-13: 4 mg via INTRAVENOUS
  Filled 2015-06-13: qty 2

## 2015-06-13 MED ORDER — CYCLOBENZAPRINE HCL 10 MG PO TABS
10.0000 mg | ORAL_TABLET | Freq: Once | ORAL | Status: AC
  Administered 2015-06-13: 10 mg via ORAL
  Filled 2015-06-13: qty 1

## 2015-06-13 MED ORDER — METHOCARBAMOL 500 MG PO TABS: 500.0000 mg | ORAL_TABLET | Freq: Two times a day (BID) | ORAL | Status: DC | PRN

## 2015-06-13 MED ORDER — SODIUM CHLORIDE 0.9 % IV BOLUS (SEPSIS)
500.0000 mL | Freq: Once | INTRAVENOUS | Status: AC
  Administered 2015-06-13: 500 mL via INTRAVENOUS

## 2015-06-13 NOTE — ED Provider Notes (Signed)
CSN: [id number]     Arrival date & time 06/13/15  1551 History   First MD Initiated Contact with Patient 06/13/15 1722     Chief Complaint  Patient presents with  . Chest Pain     (Consider location/radiation/quality/duration/timing/severity/associated sxs/prior Treatment) HPI   Danielle Gaines is a 74 y.o. with hx of IBS, chronic pain, migraine, depression, presents with approximately 2 weeks of chest wall pain, that is located all over her sternum and ribs the front and back of her chest, and made worse with movement, palpation or inspiration.  She rates her pain as 10 out of 10, she and she has tried muscle relaxers to treat with no improvement.  She feels short of breath because of the pain when she takes a breath, feels a pressure in her chest.  She has had some general malaise last 2 weeks, has some hot and cold flashes and sweats, and has some general nausea, but denies fevers.  She had some chest pain similar to this last December, but it resolved with muscle relaxers without any other issues.  She had leftover medications, metaxalone, which is what she's been taking treated without any improvement.  She denies any cough or URI preceding the chest pain, further denies any trauma or instance of physical exertion prior to her chest pain.  She states the pain has prevented her from sleeping well, she will wake up in pain because of any movement.   She denies any orthopnea, PND, exertional chest pain or dyspnea. She has not traveled anywhere recently, no long car rides or flights, has no hormone replacement therapy, or prior blood clot history.  Patient denies lower extremity swelling, palpitations, abdominal pain, vomiting, diarrhea, constipation,   Past Medical History  Diagnosis Date  . Osteoporosis   . Migraine     managed by neurologist  . Depression     managed by Dr. Toy Care  . Benzodiazepine dependence   . IBS (irritable bowel syndrome)     sees Dr. Earlean Shawl  . Colon abnormality      didn't work right so part of it was removed at [facility]  . GERD (gastroesophageal reflux disease)   . Diastolic heart failure     mild on echo 2016  . Carotid arterial disease     mild  . Chronic pain     managed by pain clinic  . Macular degeneration     goes to [facility] for this   Past Surgical History  Procedure Laterality Date  . Appendectomy    . Cholecystectomy    . Abdominal hysterectomy    . Abdominal adhesion surgery    . Small intestine surgery    . Colon surgery    . Ovarian cyst surgery     Family History  Problem Relation Age of Onset  . Kidney disease Mother   . Heart disease Mother   . Cancer Maternal Aunt     intestinal and liver cancer  . Stroke Maternal Grandmother   . Stroke    . Depression      everyone   History  Substance Use Topics  . Smoking status: Never Smoker   . Smokeless tobacco: Never Used  . Alcohol Use: No   OB History    No data available     Review of Systems  Constitutional: Positive for fatigue. Negative for fever, activity change, appetite change and unexpected weight change.  HENT: Negative.   Eyes: Negative.   Cardiovascular: Negative.  Gastrointestinal: Negative.   Genitourinary: Negative.   Musculoskeletal: Positive for myalgias and arthralgias. Negative for joint swelling.  Skin: Negative.   Neurological: Negative.       Allergies  Sulfonamide derivatives  Home Medications   Prior to Admission medications   Medication Sig Start Date End Date Taking? Authorizing Provider  alendronate (FOSAMAX) 70 MG tablet take 1 tablet by mouth ONCE A WEEK 05/20/15  Yes Lucretia Kern, DO  BIOTIN PO Take by mouth daily.   Yes Historical Provider, MD  buPROPion (WELLBUTRIN XL) 150 MG 24 hr tablet Take 1 tablet (150 mg total) by mouth every morning. 04/11/15  Yes Theodis Blaze, MD  cholecalciferol (VITAMIN D) 400 UNITS TABS tablet Take 400 Units by mouth daily.    Yes Historical Provider, MD  cycloSPORINE (RESTASIS) 0.05  % ophthalmic emulsion Place 1 drop into both eyes 2 (two) times daily.    Yes Historical Provider, MD  hyoscyamine (LEVBID) 0.375 MG 12 hr tablet Take 1 tablet (0.375 mg total) by mouth 2 (two) times daily. 04/14/15  Yes Renato Shin, MD  LINZESS 290 MCG CAPS capsule Take 290 mg by mouth daily before breakfast. 05/22/15  Yes Historical Provider, MD  minocycline (DYNACIN) 50 MG tablet Take 50 mg by mouth every morning.   Yes Historical Provider, MD  Multiple Vitamins-Minerals (CENTRUM SILVER PO) Take 1 tablet by mouth daily.    Yes Historical Provider, MD  OVER THE COUNTER MEDICATION Place 1 drop into both eyes 2 (two) times daily.   Yes Historical Provider, MD  Pancrelipase, Lip-Prot-Amyl, (ZENPEP) 15000 UNITS CPEP Take 45,000 Units by mouth 3 (three) times daily after meals.   Yes Historical Provider, MD  prednisoLONE acetate (PRED FORTE) 1 % ophthalmic suspension Place 1 drop into both eyes 2 (two) times daily.   Yes Historical Provider, MD  Probiotic Product (ALIGN PO) Take by mouth daily.   Yes Historical Provider, MD  SUMAtriptan (IMITREX) 100 MG tablet Take 100 mg by mouth every 2 (two) hours as needed for migraine or headache. May repeat in 2 hours if headache persists or recurs.   Yes Historical Provider, MD  topiramate (TOPAMAX) 100 MG tablet Take 1 tablet (100 mg total) by mouth 2 (two) times daily. 04/11/15  Yes Theodis Blaze, MD  traZODone (DESYREL) 50 MG tablet Take 50-100 mg by mouth at bedtime. 06/03/15  Yes Historical Provider, MD  methocarbamol (ROBAXIN) 500 MG tablet Take 1 tablet (500 mg total) by mouth 2 (two) times daily as needed for muscle spasms. 06/13/15   Virgel Manifold, MD  oxyCODONE-acetaminophen (PERCOCET/ROXICET) 5-325 MG per tablet Take 1 tablet by mouth every 4 (four) hours as needed for severe pain. 06/13/15   Virgel Manifold, MD   BP 111/52 mmHg  Pulse 84  Temp(Src) 98.6 F (37 C) (Oral)  Resp 19  SpO2 98% Physical Exam  Constitutional: She is oriented to person, place,  and time. She appears well-developed and well-nourished. No distress.  HENT:  Head: Normocephalic and atraumatic.  Right Ear: External ear normal.  Left Ear: External ear normal.  Nose: Nose normal.  Mouth/Throat: Oropharynx is clear and moist. No oropharyngeal exudate.  Eyes: Conjunctivae and EOM are normal. Pupils are equal, round, and reactive to light. Right eye exhibits no discharge. Left eye exhibits no discharge. No scleral icterus.  Neck: Trachea normal. Neck supple. No JVD present. Decreased range of motion present. No tracheal deviation, no edema and no erythema present. No Brudzinski's sign and no Kernig's sign noted.  No thyromegaly present.  Neck tenderness to palpation, with painful rotation, no decreased range of motion or pain with flexion and extension  Cardiovascular: Normal rate, regular rhythm, normal heart sounds and intact distal pulses.  Exam reveals no gallop and no friction rub.   No murmur heard. Pulmonary/Chest: Effort normal and breath sounds normal. No accessory muscle usage or stridor. No tachypnea. No respiratory distress. She has no decreased breath sounds. She has no wheezes. She has no rhonchi. She has no rales. She exhibits tenderness and bony tenderness. She exhibits no mass, no laceration, no crepitus, no edema, no deformity, no swelling and no retraction.  Some splinting   Abdominal: Soft. Normal appearance and bowel sounds are normal. She exhibits no distension and no mass. There is no hepatosplenomegaly. There is no tenderness. There is CVA tenderness. There is no rigidity, no rebound, no guarding, no tenderness at McBurney's point and negative Murphy's sign.  Musculoskeletal: She exhibits no edema or tenderness.  Lymphadenopathy:    She has no cervical adenopathy.  Neurological: She is alert and oriented to person, place, and time. She has normal reflexes. No cranial nerve deficit. She exhibits normal muscle tone. Coordination normal.  Skin: Skin is warm and  dry. No rash noted. She is not diaphoretic. No erythema. No pallor.  Psychiatric: She has a normal mood and affect. Her behavior is normal. Judgment and thought content normal.  Nursing note and vitals reviewed.      ED Course  Procedures (including critical care time) Labs Review Labs Reviewed  BASIC METABOLIC PANEL - Abnormal; Notable for the following:    Sodium 134 (*)    CO2 17 (*)    BUN 22 (*)    Creatinine, Ser 1.08 (*)    GFR calc non Af Amer 50 (*)    GFR calc Af Amer 58 (*)    All other components within normal limits  CBC  D-DIMER, QUANTITATIVE (NOT AT [facility])  Randolm Idol, ED    Imaging Review Dg Chest 2 View  06/13/2015   CLINICAL DATA:  Two to three-week history of chest pain.  EXAM: CHEST  2 VIEW  COMPARISON:  04/07/2015  FINDINGS: The cardiac silhouette, mediastinal and hilar contours are within normal limits and stable. The lungs are clear. Suspect mild emphysematous changes. No pleural effusion. The bony thorax is intact.  IMPRESSION: No acute cardiopulmonary findings.   Electronically Signed   By: Marijo Sanes M.D.   On: 06/13/2015 16:19     EKG Interpretation   Date/Time:  Friday June 13 2015 15:48:14 EDT Ventricular Rate:  101 PR Interval:  194 QRS Duration: 66 QT Interval:  348 QTC Calculation: 451 R Axis:   62 Text Interpretation:  Sinus tachycardia Otherwise normal ECG Confirmed by  Wilson Singer  MD, STEPHEN ([id number]) on 06/13/2015 5:25:39 PM      MDM   Final diagnoses:  Chest pain, unspecified chest pain type    Chest wall pain - sternum, and generalized to all ribs anterior and posterior Less concerning for cardiac etiology, no SOB, diaphoresis, initial troponin was negative, EKG pertinent only for tachycardia, sinus tach, which was resolved prior to IV fluids.  Chest x-ray was negative for any acute pathology.   Will r/o PE with D-dimer, low risk Pt presents most like a fibromyalgia, complains of parasthesias in feet and legs, painful to the  touch, complaining of stiff neck, painful to rotate or touch neck, trapezius, out of proportion tenderness to palpation over any part of thoracic  cavity, no meningeal signs or sx Pt does not want to try valium for any muscle spasm, hx of benzo dependence/abuse Pt has tried metaxalone - prescribed last December, without any improvement  In ED have given: Medications  sodium chloride 0.9 % bolus 500 mL (0 mLs Intravenous Stopped 06/13/15 1930)  ketorolac (TORADOL) 30 MG/ML injection 30 mg (30 mg Intravenous Given 06/13/15 1846)  ondansetron (ZOFRAN) injection 4 mg (4 mg Intravenous Given 06/13/15 1846)  morphine 4 MG/ML injection 2 mg (2 mg Intravenous Given 06/13/15 2001)  cyclobenzaprine (FLEXERIL) tablet 10 mg (10 mg Oral Given 06/13/15 1957)  dexamethasone (DECADRON) injection 6 mg (6 mg Intravenous Given 06/13/15 1958)   Steroids for any possible costochondritis, IV Toradol did not decrease the patient's pain, patient given 4 mg of morphine and will reevaluate for response.   Labs unremarkable, d-dimer negative, negative chest CT, labs showing mild dehydration  7:53 PM currently the only lab result pending is urinalysis, anticipate this will be negative and patient will be DC'd home with NSAIDs and muscle relaxers and follow up with PCP for possible evaluation and treatment of fibromyalgia.  I have discussed this patient with by attending, Dr. Wilson Singer.  Patient was seen and evaluated by Dr. Wilson Singer, and he subsequently discharged her.  Please see his documentation, for his evaluation and discharge details.    Delsa Grana, PA-C 06/14/15 1501  Virgel Manifold, MD 06/19/15 1147

## 2015-06-13 NOTE — Discharge Instructions (Signed)

## 2015-06-13 NOTE — ED Notes (Signed)
Pt presents with c/o generalized chest pain that she has had for approx a week. Pt reports that the pain feels like pressure in nature. Pt reports she has had this before and was told that her chest wall was swollen. Pt denies any radiation of her chest pain. NAD at this time time, able to answer questions in complete sentences.

## 2015-07-08 ENCOUNTER — Other Ambulatory Visit: Payer: Self-pay

## 2015-07-08 MED ORDER — HYOSCYAMINE SULFATE ER 0.375 MG PO TB12: 0.3750 mg | ORAL_TABLET | Freq: Two times a day (BID) | ORAL | Status: DC

## 2015-07-17 ENCOUNTER — Ambulatory Visit (INDEPENDENT_AMBULATORY_CARE_PROVIDER_SITE_OTHER): Payer: BLUE CROSS/BLUE SHIELD | Admitting: Family Medicine

## 2015-07-17 ENCOUNTER — Encounter: Payer: Self-pay | Admitting: Family Medicine

## 2015-07-17 VITALS — BP 120/76 | HR 70 | Temp 97.7°F | Ht 60.25 in | Wt 127.7 lb

## 2015-07-17 DIAGNOSIS — F419 Anxiety disorder, unspecified: Secondary | ICD-10-CM

## 2015-07-17 DIAGNOSIS — M810 Age-related osteoporosis without current pathological fracture: Secondary | ICD-10-CM

## 2015-07-17 DIAGNOSIS — G43009 Migraine without aura, not intractable, without status migrainosus: Secondary | ICD-10-CM | POA: Diagnosis not present

## 2015-07-17 DIAGNOSIS — Z23 Encounter for immunization: Secondary | ICD-10-CM

## 2015-07-17 DIAGNOSIS — R300 Dysuria: Secondary | ICD-10-CM | POA: Diagnosis not present

## 2015-07-17 DIAGNOSIS — F329 Major depressive disorder, single episode, unspecified: Secondary | ICD-10-CM

## 2015-07-17 DIAGNOSIS — N179 Acute kidney failure, unspecified: Secondary | ICD-10-CM

## 2015-07-17 DIAGNOSIS — F418 Other specified anxiety disorders: Secondary | ICD-10-CM | POA: Diagnosis not present

## 2015-07-17 DIAGNOSIS — Z0000 Encounter for general adult medical examination without abnormal findings: Secondary | ICD-10-CM

## 2015-07-17 LAB — BASIC METABOLIC PANEL
BUN: 20 mg/dL (ref 6–23)
CO2: 19 mEq/L (ref 19–32)
Calcium: 10.6 mg/dL — ABNORMAL HIGH (ref 8.4–10.5)
Chloride: 111 mEq/L (ref 96–112)
Creatinine, Ser: 1.23 mg/dL — ABNORMAL HIGH (ref 0.40–1.20)
GFR: 45.62 mL/min — ABNORMAL LOW (ref >60.00)
Glucose, Bld: 107 mg/dL — ABNORMAL HIGH (ref 70–99)
Potassium: 5 mEq/L (ref 3.5–5.1)
Sodium: 137 mEq/L (ref 135–145)

## 2015-07-17 NOTE — Progress Notes (Signed)
Pre visit review using our clinic review tool, if applicable. No additional management support is needed unless otherwise documented below in the visit note. 

## 2015-07-17 NOTE — Patient Instructions (Addendum)
BEFORE YOU LEAVE: -please get last colonoscopy for Dr. Earlean Shawl and find out when she is due for her next colonoscopy -bmp -follow up in 3-4 months  We advise that you get your flu shot, we will have it available here after August 22nd  -We placed a referral for you as discussed for the DEXA scan and to the urologist. It usually takes about 1-2 weeks to process and schedule this referral. If you have not heard from [place] regarding this appointment in 2 weeks please contact our office.  Please bring a copy of your living will/HCPOA documents regarding your health  We recommend the following healthy lifestyle measures: - eat a healthy diet consisting of lots of vegetables, fruits, beans, nuts, seeds, healthy meats such as white chicken and fish - avoid fried foods, starches, sweets, fast food, processed foods, sodas, red meet and other fattening foods.  - get a least 150 minutes of aerobic exercise per week.

## 2015-07-17 NOTE — Addendum Note (Signed)
Addended by: Lucretia Kern on: 07/17/2015 02:31 PM   Modules accepted: Orders

## 2015-07-17 NOTE — Addendum Note (Signed)
Addended by: Agnes Lawrence on: 07/17/2015 02:31 PM   Modules accepted: Orders

## 2015-07-17 NOTE — Progress Notes (Signed)
Medicare Annual Preventive Care Visit  (initial annual wellness or annual wellness exam)  Danielle Gaines is a pleasant 74 yo recently new patient to our practice with a very challenging and complicated pmh. She struggles with depression, migraines, IBS and chronic pain and sees several specialists.  Concerns today: Mild bladder discomfort and dysuria since a catheterization. Frequency, urgency and discomfort in bladder. No blood in the urine. We have done several urine studies and cultures here with minimal mult org present on culture.Denies changes in bowel, vag symptoms, fevers. She would like to see the urologist for this. She had an elevated cr in the past. Will recheck this today.  ROS: negative for report of fevers, unintentional weight loss, vision changes, vision loss, hearing loss or change, chest pain, sob, hemoptysis, melena, hematochezia, hematuria, genital discharge or lesions, falls, bleeding or bruising, loc, thoughts of suicide or self harm, memory loss  1.) Patient-completed health risk assessment  - completed and reviewed, see scanned documentation  2.) Review of Medical History: -PMH, PSH, Family History and current specialty and care providers reviewed and updated and listed below  - see scanned in document in chart and below  Past Medical History  Diagnosis Date  . Osteoporosis   . Migraine     managed by neurologist  . Depression     managed by Dr. Toy Care  . Benzodiazepine dependence   . IBS (irritable bowel syndrome)     sees Dr. Earlean Shawl  . Colon abnormality     didn't work right so part of it was removed at [facility]  . GERD (gastroesophageal reflux disease)   . Diastolic heart failure     mild on echo 2016  . Carotid arterial disease     mild  . Chronic pain     managed by pain clinic  . Macular degeneration     goes to [facility] for this    Past Surgical History  Procedure Laterality Date  . Appendectomy    . Cholecystectomy    . Abdominal  hysterectomy    . Abdominal adhesion surgery    . Small intestine surgery    . Colon surgery    . Ovarian cyst surgery      Social History   Social History  . Marital Status: Married    Spouse Name: N/A  . Number of Children: N/A  . Years of Education: N/A   Occupational History  . Not on file.   Social History Main Topics  . Smoking status: Never Smoker   . Smokeless tobacco: Never Used  . Alcohol Use: No  . Drug Use: No  . Sexual Activity: No   Other Topics Concern  . Not on file   Social History Narrative   Work or School: none      Home Situation: lives with husband      Spiritual Beliefs:       Lifestyle: no regular exercise; diet is ok             The patient has a family history of  3.) Review of functional ability and level of safety:  Any difficulty hearing? YES, seeing ENT, has not done hearing aides - more when in a noisy room  History of falling? NO  Any trouble with IADLs - using a phone, using transportation, grocery shopping, preparing meals, doing housework, doing laundry, taking medications and managing Danielle? NO  Advance Directives? YES   See summary of recommendations in Patient Instructions below.  4.)  Physical Exam Filed Vitals:   07/17/15 1325  BP: 120/76  Pulse: 70  Temp: 97.7 F (36.5 C)   Estimated body mass index is 24.74 kg/(m^2) as calculated from the following:   Height as of this encounter: 5' 0.25" (1.53 m).   Weight as of this encounter: 127 lb 11.2 oz (57.924 kg).  EKG (optional): deferred  General: alert, appear well hydrated and in no acute distress  HEENT: visual acuity grossly intact  CV: HRRR  Lungs: CTA bilaterally  Psych: pleasant and cooperative, no obvious depression or anxiety  Cognitive function grossly intact  See patient instructions for recommendations.  Education and counseling regarding the above review of health provided with a plan for the following: -see scanned patient  completed form for further details -fall prevention strategies discussed  -healthy lifestyle discussed -importance and resources for completing advanced directives discussed -see patient instructions below for any other recommendations provided  4)The following written screening schedule of preventive measures were reviewed with assessment and plan made per below, orders and patient instructions:       Alcohol screening no     Obesity Screening and counseling: done     STI screening (Hep C if born 1943-65):     Tobacco Screening: no       Pneumococcal (PPSV23 -one dose after 64, one before if risk factors), influenza yearly and hepatitis B vaccines (if high risk - end stage renal disease, IV drugs, homosexual men, live in home for mentally retarded, hemophilia receiving factors) ASSESSMENT/PLAN:done, had prevnar 13 09/2014      Screening mammograph (yearly if >40) ASSESSMENT/PLAN:  Does yearly, reports this is cheduled      Screening Pap smear/pelvic exam (q2 years) ASSESSMENT/PLAN: n/a      Colorectal cancer screening (FOBT yearly or flex sig q4y or colonoscopy q10y or barium enema q4y) ASSESSMENT/PLAN: sees Dr. Earlean Shawl for IBS, gets colonoscopies every 3-4 years, reports UTD      Diabetes outpatient self-management training services ASSESSMENT/PLAN:  n/a      Bone mass measurements(covered q2y if indicated - estrogen def, osteoporosis, hyperparathyroid, vertebral abnormalities, osteoporosis or steroids) ASSESSMENT/PLAN: ordered DEXA      Screening for glaucoma(q1y if high risk - diabetes, FH, AA and > 50 or hispanic and > 65) ASSESSMENT/PLAN: sees optho      Medical nutritional therapy for individuals with diabetes or renal disease ASSESSMENT/PLAN: n/a      Cardiovascular screening blood tests (lipids q5y) ASSESSMENT/PLAN: done      Diabetes screening tests ASSESSMENT/PLAN:   7.) Summary: -risk factors and conditions per above assessment were discussed and treatment,  recommendations and referrals were offered per documentation above and orders and patient instructions.  Medicare annual wellness visit, subsequent  Osteoporosis - Plan: DG Bone Density -on fosomax  Dysuria - Plan: Ambulatory referral to Urology -discussed potential etiologies, she feels is related to catherization, opted for urology referral - placed  Anxiety and depression - managed at [facility] -stable  Migraine without aura and without status migrainosus, not intractable -stable  AKI (acute kidney injury) -repeat BMP, referral to urology  Patient Instructions  BEFORE YOU LEAVE: -please get last colonoscopy for Dr. Earlean Shawl and find out when she is due for her next colonoscopy -bmp -follow up in 3-4 months  We advise that you get your flu shot, we will have it available here after August 22nd  -We placed a referral for you as discussed for the DEXA scan and to the urologist. It usually takes about 1-2  weeks to process and schedule this referral. If you have not heard from [place] regarding this appointment in 2 weeks please contact our office.  Please bring a copy of your living will/HCPOA documents regarding your health  We recommend the following healthy lifestyle measures: - eat a healthy diet consisting of lots of vegetables, fruits, beans, nuts, seeds, healthy meats such as white chicken and fish - avoid fried foods, starches, sweets, fast food, processed foods, sodas, red meet and other fattening foods.  - get a least 150 minutes of aerobic exercise per week.

## 2015-07-21 ENCOUNTER — Encounter: Payer: Self-pay | Admitting: Family Medicine

## 2015-08-18 ENCOUNTER — Other Ambulatory Visit: Payer: Self-pay

## 2015-08-18 MED ORDER — HYOSCYAMINE SULFATE ER 0.375 MG PO TB12: 0.3750 mg | ORAL_TABLET | Freq: Two times a day (BID) | ORAL | Status: DC

## 2015-08-26 ENCOUNTER — Ambulatory Visit (INDEPENDENT_AMBULATORY_CARE_PROVIDER_SITE_OTHER): Payer: BLUE CROSS/BLUE SHIELD | Admitting: *Deleted

## 2015-08-26 ENCOUNTER — Other Ambulatory Visit (INDEPENDENT_AMBULATORY_CARE_PROVIDER_SITE_OTHER): Payer: BLUE CROSS/BLUE SHIELD

## 2015-08-26 DIAGNOSIS — N179 Acute kidney failure, unspecified: Secondary | ICD-10-CM | POA: Diagnosis not present

## 2015-08-26 DIAGNOSIS — Z23 Encounter for immunization: Secondary | ICD-10-CM | POA: Diagnosis not present

## 2015-08-27 LAB — PTH, INTACT AND CALCIUM
Calcium: 10.9 mg/dL — ABNORMAL HIGH (ref 8.4–10.5)
PTH: 22 pg/mL (ref 14–64)

## 2015-09-02 ENCOUNTER — Ambulatory Visit: Payer: Medicare Other | Admitting: Endocrinology

## 2015-09-03 ENCOUNTER — Encounter: Payer: Self-pay | Admitting: Endocrinology

## 2015-09-03 ENCOUNTER — Ambulatory Visit (INDEPENDENT_AMBULATORY_CARE_PROVIDER_SITE_OTHER): Payer: BLUE CROSS/BLUE SHIELD | Admitting: Endocrinology

## 2015-09-03 DIAGNOSIS — E673 Hypervitaminosis D: Secondary | ICD-10-CM | POA: Diagnosis not present

## 2015-09-03 LAB — VITAMIN D 25 HYDROXY (VIT D DEFICIENCY, FRACTURES): VITD: 107.24 ng/mL (ref 30.00–100.00)

## 2015-09-03 LAB — TSH: TSH: 1.77 u[IU]/mL (ref 0.35–4.50)

## 2015-09-03 NOTE — Progress Notes (Signed)
Subjective:    Patient ID: Danielle Gaines, female    DOB: 1941/01/09, 74 y.o.   MRN: [id number]  HPI Pt states few mos of severe itching throughout the body, and assoc muscle cramps. Pt was first noted to have hypercalcemia in.  He has never had the following: PUD, pancreatitis, bony fracture, Li++, Rhabdomyolysis, cancer, prolonged immobilization, antacid ingestion, HCTZ , thyroid dz, and granulomatous dz. She had an episode of urolithiasis earlier this year. She takes a vit-D supplement, but not vit-A Past Medical History  Diagnosis Date  . Osteoporosis   . Migraine     managed by neurologist  . Depression     managed by Dr. Toy Care  . Benzodiazepine dependence ([place])   . IBS (irritable bowel syndrome)     sees Dr. Earlean Shawl  . Colon abnormality     didn't work right so part of it was removed at [facility]  . GERD (gastroesophageal reflux disease)   . Diastolic heart failure ([place])     mild on echo 2016  . Carotid arterial disease ([facility])     mild  . Chronic pain     managed by pain clinic  . Macular degeneration     goes to [facility] for this    Past Surgical History  Procedure Laterality Date  . Appendectomy    . Cholecystectomy    . Abdominal hysterectomy    . Abdominal adhesion surgery    . Small intestine surgery    . Colon surgery    . Ovarian cyst surgery      Social History   Social History  . Marital Status: Married    Spouse Name: N/A  . Number of Children: N/A  . Years of Education: N/A   Occupational History  . Not on file.   Social History Main Topics  . Smoking status: Never Smoker   . Smokeless tobacco: Never Used  . Alcohol Use: No  . Drug Use: No  . Sexual Activity: No   Other Topics Concern  . Not on file   Social History Narrative   Work or School: none      Home Situation: lives with husband      Spiritual Beliefs:       Lifestyle: no regular exercise; diet is ok             Current Outpatient Prescriptions on File Prior to  Visit  Medication Sig Dispense Refill  . alendronate (FOSAMAX) 70 MG tablet take 1 tablet by mouth ONCE A WEEK 4 tablet 5  . BIOTIN PO Take by mouth daily.    . cycloSPORINE (RESTASIS) 0.05 % ophthalmic emulsion Place 1 drop into both eyes 2 (two) times daily.     Marland Kitchen desvenlafaxine (PRISTIQ) 50 MG 24 hr tablet Take 50 mg by mouth at bedtime.    . hyoscyamine (LEVBID) 0.375 MG 12 hr tablet Take 1 tablet (0.375 mg total) by mouth 2 (two) times daily. 60 tablet 0  . LINZESS 290 MCG CAPS capsule Take 290 mg by mouth daily before breakfast.  0  . metaxalone (SKELAXIN) 800 MG tablet take 1 tablet by mouth three times a day if needed for pain    . minocycline (DYNACIN) 50 MG tablet Take 50 mg by mouth every morning.    Marland Kitchen OVER THE COUNTER MEDICATION Place 1 drop into both eyes 2 (two) times daily.    . Pancrelipase, Lip-Prot-Amyl, (ZENPEP) 15000 UNITS CPEP Take 45,000 Units by mouth 3 (three) times  daily after meals.    . Probiotic Product (ALIGN PO) Take by mouth daily.    . SUMAtriptan (IMITREX) 100 MG tablet Take 100 mg by mouth every 2 (two) hours as needed for migraine or headache. May repeat in 2 hours if headache persists or recurs.    . topiramate (TOPAMAX) 100 MG tablet Take 1 tablet (100 mg total) by mouth 2 (two) times daily. 60 tablet 1  . traZODone (DESYREL) 50 MG tablet Take 50-100 mg by mouth at bedtime.  0  . Multiple Vitamins-Minerals (CENTRUM SILVER PO) Take 1 tablet by mouth daily.     . prednisoLONE acetate (PRED FORTE) 1 % ophthalmic suspension Place 1 drop into both eyes 2 (two) times daily.     No current facility-administered medications on file prior to visit.    Allergies  Allergen Reactions  . Sulfonamide Derivatives Swelling    Family History  Problem Relation Age of Onset  . Kidney disease Mother   . Heart disease Mother   . Cancer Maternal Aunt     intestinal and liver cancer  . Stroke Maternal Grandmother   . Stroke    . Depression      everyone  .  Hypercalcemia Neg Hx     BP 144/86 mmHg  Pulse 87  Temp(Src) 98.2 F (36.8 C) (Oral)  Ht 5' 0.25" (1.53 m)  Wt 129 lb (58.514 kg)  BMI 25.00 kg/m2  SpO2 97%    Review of Systems denies weight loss, galactorrhea, hematuria, memory loss, , numbness, arthralgias, abdominal pain, muscle weakness, urinary frequency, hypoglycemia, skin rash, visual loss, sob, diarrhea, rhinorrhea, easy bruising, and depression.     Objective:   Physical Exam VITAL SIGNS:  See vs page GENERAL: no distress Chest wall: no kyphosis.   Lab Results  Component Value Date   PTH 22 08/26/2015   CALCIUM 10.9* 08/26/2015   CAION 1.43* 05/20/2015  25-OH vit-D=10    Assessment & Plan:  Hypervitaminosis D, new Hypercalcemia, possibly due to the high vit-D. Itching, uncertain etiology.  Possibly not due to the high vit-D  Patient is advised the following:  Patient Instructions  blood tests (and a 24-HR urine test) are requested for you today.  We'll let you know about the results.    addendum: d/c vit-D. receck labs in 1 month.

## 2015-09-03 NOTE — Patient Instructions (Signed)
blood tests (and a 24-HR urine test) are requested for you today.  We'll let you know about the results.

## 2015-09-04 LAB — PTH, INTACT AND CALCIUM
Calcium: 10.3 mg/dL (ref 8.4–10.5)
PTH: 26 pg/mL (ref 14–64)

## 2015-09-05 ENCOUNTER — Other Ambulatory Visit: Payer: Medicare Other

## 2015-09-05 LAB — PROTEIN ELECTROPHORESIS, SERUM
Albumin ELP: 4.6 g/dL (ref 3.8–4.8)
Alpha-1-Globulin: 0.3 g/dL (ref 0.2–0.3)
Alpha-2-Globulin: 0.8 g/dL (ref 0.5–0.9)
Beta 2: 0.5 g/dL (ref 0.2–0.5)
Beta Globulin: 0.5 g/dL (ref 0.4–0.6)
Gamma Globulin: 1.2 g/dL (ref 0.8–1.7)
Total Protein, Serum Electrophoresis: 7.9 g/dL (ref 6.1–8.1)

## 2015-09-06 LAB — CALCIUM, URINE, 24 HOUR
Calcium, 24 hour urine: 190 mg/d (ref 100–250)
Calcium, Ur: 19 mg/dL

## 2015-09-06 LAB — VITAMIN D 1,25 DIHYDROXY
Vitamin D 1, 25 (OH)2 Total: 39 pg/mL (ref 18–72)
Vitamin D2 1, 25 (OH)2: <8 pg/mL
Vitamin D3 1, 25 (OH)2: 39 pg/mL

## 2015-09-08 LAB — VITAMIN A: Vitamin A (Retinoic Acid): 116 ug/dL — ABNORMAL HIGH (ref 38–98)

## 2015-09-08 NOTE — Progress Notes (Signed)
   Subjective:    Patient ID: Danielle Gaines, female    DOB: Jul 01, 1941, 74 y.o.   MRN: [id number]  HPI Pt was noted to have hypercalcemia in mid-2016.   Review of Systems     Objective:   Physical Exam        Assessment & Plan:

## 2015-09-09 ENCOUNTER — Other Ambulatory Visit: Payer: Self-pay

## 2015-09-09 DIAGNOSIS — Z1231 Encounter for screening mammogram for malignant neoplasm of breast: Secondary | ICD-10-CM

## 2015-09-10 LAB — PTH-RELATED PEPTIDE: PTH-Related Protein (PTH-RP): 19 pg/mL (ref 14–27)

## 2015-09-12 ENCOUNTER — Other Ambulatory Visit: Payer: Self-pay | Admitting: *Deleted

## 2015-09-12 MED ORDER — HYOSCYAMINE SULFATE ER 0.375 MG PO TB12: 0.3750 mg | ORAL_TABLET | Freq: Two times a day (BID) | ORAL | Status: DC

## 2015-10-09 ENCOUNTER — Ambulatory Visit
Admission: RE | Admit: 2015-10-09 | Discharge: 2015-10-09 | Disposition: A | Discharge status: Home / Self Care | Payer: BLUE CROSS/BLUE SHIELD | Source: Ambulatory Visit | Attending: Family Medicine | Admitting: Family Medicine

## 2015-10-09 ENCOUNTER — Ambulatory Visit: Payer: Medicare Other

## 2015-10-09 ENCOUNTER — Ambulatory Visit
Admission: RE | Admit: 2015-10-09 | Discharge: 2015-10-09 | Disposition: A | Discharge status: Home / Self Care | Payer: BLUE CROSS/BLUE SHIELD | Source: Ambulatory Visit

## 2015-10-09 DIAGNOSIS — M810 Age-related osteoporosis without current pathological fracture: Secondary | ICD-10-CM

## 2015-10-09 DIAGNOSIS — Z1231 Encounter for screening mammogram for malignant neoplasm of breast: Secondary | ICD-10-CM

## 2015-10-16 ENCOUNTER — Encounter: Payer: Self-pay | Admitting: Cardiovascular Disease

## 2015-10-17 ENCOUNTER — Ambulatory Visit: Payer: Medicare Other | Admitting: Family Medicine

## 2015-10-29 ENCOUNTER — Other Ambulatory Visit: Payer: BLUE CROSS/BLUE SHIELD

## 2015-10-31 ENCOUNTER — Other Ambulatory Visit (INDEPENDENT_AMBULATORY_CARE_PROVIDER_SITE_OTHER): Payer: BLUE CROSS/BLUE SHIELD

## 2015-10-31 LAB — VITAMIN D 25 HYDROXY (VIT D DEFICIENCY, FRACTURES): VITD: 54.39 ng/mL (ref 30.00–100.00)

## 2015-11-01 ENCOUNTER — Other Ambulatory Visit: Payer: Self-pay | Admitting: Family Medicine

## 2015-11-03 LAB — PTH, INTACT AND CALCIUM
Calcium: 9.5 mg/dL (ref 8.4–10.5)
PTH: 23 pg/mL (ref 14–64)

## 2015-11-18 ENCOUNTER — Ambulatory Visit: Payer: BLUE CROSS/BLUE SHIELD | Admitting: Cardiovascular Disease

## 2015-11-21 ENCOUNTER — Ambulatory Visit: Payer: Medicare Other | Admitting: Cardiovascular Disease

## 2015-12-09 ENCOUNTER — Ambulatory Visit: Payer: BLUE CROSS/BLUE SHIELD | Admitting: Cardiovascular Disease

## 2015-12-10 ENCOUNTER — Other Ambulatory Visit: Payer: Self-pay | Admitting: Endocrinology

## 2015-12-30 DIAGNOSIS — H35371 Puckering of macula, right eye: Secondary | ICD-10-CM | POA: Insufficient documentation

## 2016-01-13 DIAGNOSIS — H3581 Retinal edema: Secondary | ICD-10-CM | POA: Insufficient documentation

## 2016-02-09 ENCOUNTER — Encounter: Payer: Self-pay | Admitting: Cardiovascular Disease

## 2016-02-09 ENCOUNTER — Ambulatory Visit (INDEPENDENT_AMBULATORY_CARE_PROVIDER_SITE_OTHER): Payer: BLUE CROSS/BLUE SHIELD | Admitting: Cardiovascular Disease

## 2016-02-09 VITALS — BP 144/84 | HR 65 | Wt 135.5 lb

## 2016-02-09 DIAGNOSIS — R0602 Shortness of breath: Secondary | ICD-10-CM | POA: Diagnosis not present

## 2016-02-09 DIAGNOSIS — I951 Orthostatic hypotension: Secondary | ICD-10-CM | POA: Diagnosis not present

## 2016-02-09 DIAGNOSIS — R0789 Other chest pain: Secondary | ICD-10-CM

## 2016-02-09 DIAGNOSIS — R0600 Dyspnea, unspecified: Secondary | ICD-10-CM

## 2016-02-09 DIAGNOSIS — R9431 Abnormal electrocardiogram [ECG] [EKG]: Secondary | ICD-10-CM

## 2016-02-09 DIAGNOSIS — Z01818 Encounter for other preprocedural examination: Secondary | ICD-10-CM

## 2016-02-09 NOTE — Patient Instructions (Signed)
Your physician recommends that you schedule a follow-up appointment as needed with Dr Claiborne Billings. You have been given clearance to have your procedure to be done by Dr Earlean Shawl.

## 2016-02-10 ENCOUNTER — Encounter: Payer: Self-pay | Admitting: Cardiovascular Disease

## 2016-02-10 NOTE — Progress Notes (Signed)
Patient ID: Danielle Gaines, female   DOB: Apr 12, 1941, 75 y.o.   MRN: [id number]     Primary MD: Dr. Colin Benton Refferring MD: Dr. Richmond Campbell  PATIENT PROFILE: Danielle Gaines is a 75 y.o. female  who is referred through the courtesy of Dr. Earlean Shawl for audiology evaluation prior to colonoscopy.   HPI:  Danielle Gaines denies any awareness of significant coronary artery disease.  She has a long-standing history of irritable bowel syndrome, and 20 years ago underwent partial colectomy.  She has a history of migraine headaches.  In the past, there is a question of whether or not she may have suffered a small CVA.  She admits to experiencing shortness of breath, which is nonexertional.  Review of her [facility] records reveals that in May 2016 she had presented with slurred speech and weakness of the left elbow extremity.  A CT scan and MRI of the brain did not show signs of an acute stroke.  She has a history of a major depressive disorder for which she had been on sedating medications which could have contributed to her episode.  An echo Doppler study at that time revealed an ejection fraction of 55-60%. There was grade 1 diastolic dysfunction, which was new compared to her prior echo in 2012.  I have reviewed her ECGs from the past and they have consistently shown QS complex anteriorly, particularly in V1 and V2 both in July 2015 and in May 2016.  In July 2016 she had an emergency room visit for chest pain which was very atypical for CAD.  She denies any exertional precipitation of chest discomfort.  She does not exercise.  She has a history of vitamin D excess lead to increased calcium, which has improved.  She recently presented to undergo a GI procedure and due to a questionable history of an ER evaluation for chest pain and her ECG.  Cardiology clearance was recommended prior to undergoing her colonoscopy or endoscopy procedure  Past Medical History  Diagnosis Date  . Osteoporosis   . Migraine     managed  by neurologist  . Depression     managed by Dr. Toy Care  . Benzodiazepine dependence ([place])   . IBS (irritable bowel syndrome)     sees Dr. Earlean Shawl  . Colon abnormality     didn't work right so part of it was removed at [facility]  . GERD (gastroesophageal reflux disease)   . Diastolic heart failure ([place])     mild on echo 2016  . Carotid arterial disease ([facility])     mild  . Chronic pain     managed by pain clinic  . Macular degeneration     goes to [facility]    Past Surgical History  Procedure Laterality Date  . Appendectomy    . Cholecystectomy    . Abdominal hysterectomy    . Abdominal adhesion surgery    . Small intestine surgery    . Colon surgery    . Ovarian cyst surgery      Allergies  Allergen Reactions  . Sulfonamide Derivatives Swelling    Current Outpatient Prescriptions  Medication Sig Dispense Refill  . alendronate (FOSAMAX) 70 MG tablet take 1 tablet by mouth every week 4 tablet 5  . BIOTIN PO Take by mouth daily.    . cycloSPORINE (RESTASIS) 0.05 % ophthalmic emulsion Place 1 drop into both eyes 2 (two) times daily.     Marland Kitchen desvenlafaxine (PRISTIQ) 50 MG 24  hr tablet Take 50 mg by mouth at bedtime.    . hyoscyamine (LEVBID) 0.375 MG 12 hr tablet take 1 tablet by mouth twice a day 60 tablet 2  . LINZESS 290 MCG CAPS capsule Take 290 mg by mouth daily before breakfast.  0  . Multiple Vitamins-Minerals (CENTRUM SILVER PO) Take 1 tablet by mouth daily.     Marland Kitchen OVER THE COUNTER MEDICATION Place 1 drop into both eyes 2 (two) times daily.    . Pancrelipase, Lip-Prot-Amyl, (ZENPEP) 15000 UNITS CPEP Take 45,000 Units by mouth 3 (three) times daily after meals.    . prednisoLONE acetate (PRED FORTE) 1 % ophthalmic suspension Place 1 drop into both eyes 2 (two) times daily.    . Probiotic Product (ALIGN PO) Take by mouth daily.    . SUMAtriptan (IMITREX) 100 MG tablet Take 100 mg by mouth every 2 (two) hours as needed for migraine or headache. May repeat in 2 hours  if headache persists or recurs.    . topiramate (TOPAMAX) 100 MG tablet Take 1 tablet (100 mg total) by mouth 2 (two) times daily. 60 tablet 1  . traZODone (DESYREL) 50 MG tablet Take 50-100 mg by mouth at bedtime.  0  . Vortioxetine HBr (TRINTELLIX) 5 MG TABS Take by mouth.     No current facility-administered medications for this visit.    Social History   Social History  . Marital Status: Married    Spouse Name: N/A  . Number of Children: N/A  . Years of Education: N/A   Occupational History  . Not on file.   Social History Main Topics  . Smoking status: Never Smoker   . Smokeless tobacco: Never Used  . Alcohol Use: No  . Drug Use: No  . Sexual Activity: No   Other Topics Concern  . Not on file   Social History Narrative   Work or School: none      Home Situation: lives with husband      Spiritual Beliefs:       Lifestyle: no regular exercise; diet is ok            Additional social history is notable in that she is married for 27 years.  She lives with her husband.  She has one child, 6 grandchildren and 2 great-grandchildren.  She does not drink alcohol.  There is no tobacco use.  Family History  Problem Relation Age of Onset  . Kidney disease Mother   . Heart disease Mother   . Cancer Maternal Aunt     intestinal and liver cancer  . Stroke Maternal Grandmother   . Stroke    . Depression      everyone  . Hypercalcemia Neg Hx    Additional family history is notable in that her mother died at 53 and also had congestive heart failure and atrial fibrillation.  She never knew her father.  She has a sister who is 97 and alive and well.  ROS General: Negative; No fevers, chills, or night sweats HEENT: Negative; No changes in vision or hearing, sinus congestion, difficulty swallowing Pulmonary: Negative; No cough, wheezing, shortness of breath, hemoptysis Cardiovascular:  See HPI; No recent  chest pain, presyncope, syncope, palpitations, edema GI: Positive  for irritable bowel syndrome and partial colectomy GU: Negative; No dysuria, hematuria, or difficulty voiding Musculoskeletal: Positive for osteoporosis Hematologic/Oncologic: Negative; no easy bruising, bleeding Endocrine: Negative; no heat/cold intolerance; no diabetes Neuro: History of migraine headaches Skin: Negative; No rashes or  skin lesions Psychiatric: Positive for depression Sleep: Negative; No daytime sleepiness, hypersomnolence, bruxism, restless legs, hypnogagnic hallucinations Other comprehensive 14 point system review is negative   Physical Exam BP 144/84 mmHg  Pulse 65  Wt 135 lb 8 oz (61.462 kg)  Wt Readings from Last 3 Encounters:  02/09/16 135 lb 8 oz (61.462 kg)  09/03/15 129 lb (58.514 kg)  07/17/15 127 lb 11.2 oz (57.924 kg)   General: Alert, oriented, no distress.  Skin: normal turgor, no rashes, warm and dry HEENT: Normocephalic, atraumatic. Pupils equal round and reactive to light; sclera anicteric; extraocular muscles intact; Fundi Without hemorrhages or exudates Nose without nasal septal hypertrophy Mouth/Parynx benign; Mallinpatti scale Neck: No JVD, no carotid bruits; normal carotid upstroke Lungs: clear to ausculatation and percussion; no wheezing or rales Chest wall: without tenderness to palpitation Heart: PMI not displaced, RRR, s1 s2 normal, trivial systolic murmur, no diastolic murmur, no rubs, gallops, thrills, or heaves Abdomen: soft, nontender; no hepatosplenomehaly, BS+; abdominal aorta nontender and not dilated by palpation. Back: no CVA tenderness Pulses 2+ Musculoskeletal: full range of motion, normal strength, no joint deformities Extremities: no clubbing cyanosis or edema, Homan's sign negative  Neurologic: grossly nonfocal; Cranial nerves grossly wnl Psychologic: Normal mood and affect   ECG (independently read by me): Normal sinus rhythm at 65 bpm.  QRS complex V1 and V2 which is unchanged from her prior ECGs.  LABS:  BMP  Latest Ref Rng 10/31/2015 09/03/2015 08/26/2015  Glucose 70 - 99 mg/dL - - -  BUN 6 - 23 mg/dL - - -  Creatinine 0.40 - 1.20 mg/dL - - -  Sodium 135 - 145 mEq/L - - -  Potassium 3.5 - 5.1 mEq/L - - -  Chloride 96 - 112 mEq/L - - -  CO2 19 - 32 mEq/L - - -  Calcium 8.4 - 10.5 mg/dL 9.5 10.3 10.9(H)     Hepatic Function Latest Ref Rng 04/07/2015 12/30/2010 12/29/2010  Total Protein 6.5 - 8.1 g/dL 7.3 6.0 6.5  Albumin 3.5 - 5.0 g/dL 4.0 3.3(L) 3.6  AST 15 - 41 U/L 29 37 24  ALT 14 - 54 U/L 47 28 22  Alk Phosphatase 38 - 126 U/L 80 56 66  Total Bilirubin 0.3 - 1.2 mg/dL 0.9 0.6 0.3    CBC Latest Ref Rng 06/13/2015 05/02/2015 04/11/2015  WBC 4.0 - 10.5 K/uL 8.7 9.6 5.8  Hemoglobin 12.0 - 15.0 g/dL 13.7 14.1 11.3(L)  Hematocrit 36.0 - 46.0 % 41.1 43.3 34.0(L)  Platelets 150 - 400 K/uL 325 347.0 220   Lab Results  Component Value Date   MCV 95.1 06/13/2015   MCV 96.4 05/02/2015   MCV 96.0 04/11/2015   Lab Results  Component Value Date   TSH 1.77 09/03/2015   Lab Results  Component Value Date   HGBA1C 5.6 04/08/2015     BNP No results found for: BNP  ProBNP    Component Value Date/Time   PROBNP <30.0 12/29/2010 1233     Lipid Panel     Component Value Date/Time   CHOL 127 04/08/2015 0530   TRIG 136 04/08/2015 0530   HDL 39* 04/08/2015 0530   CHOLHDL 3.3 04/08/2015 0530   VLDL 27 04/08/2015 0530   LDLCALC 61 04/08/2015 0530    RADIOLOGY: No results found.   ASSESSMENT AND PLAN: Danielle Gaines is a 75 year old female who has a history of migraine headaches, depression, GERD, irritable bowel syndrome, who denies any significant cardiac history.  She has  been found to have QS complex on her ECGs which are old.  She denies any episodes of chest pain which sound worrisome for CAD.  Remotely, she was evaluated last summer with atypical chest pain.  She has experienced shortness of breath which is nonexertional.  She is not exercise and is fairly sedentary and I suspect is  significantly cardiovascularly deconditioned.  Her echo Doppler study has confirmed normal systolic function with grade 1 diastolic dysfunction which may be contributory to some of her shortness of breath.  Clinically, I feel she is stable from a cardiovascular standpoint to undergo her planned GI procedures.  I will be available to see her in the future if problems arise but at present will not schedule her follow-up appointment.   Troy Sine, MD, [facility] 02/10/2016 5:33 PM

## 2016-03-15 ENCOUNTER — Other Ambulatory Visit: Payer: Self-pay | Admitting: Endocrinology

## 2016-04-15 ENCOUNTER — Other Ambulatory Visit: Payer: Self-pay | Admitting: Gastroenterology

## 2016-04-15 DIAGNOSIS — K858 Other acute pancreatitis without necrosis or infection: Secondary | ICD-10-CM

## 2016-04-15 DIAGNOSIS — N200 Calculus of kidney: Secondary | ICD-10-CM

## 2016-04-19 ENCOUNTER — Other Ambulatory Visit: Payer: Self-pay | Admitting: Endocrinology

## 2016-04-21 ENCOUNTER — Ambulatory Visit
Admission: RE | Admit: 2016-04-21 | Discharge: 2016-04-21 | Disposition: A | Discharge status: Home / Self Care | Payer: BLUE CROSS/BLUE SHIELD | Source: Ambulatory Visit | Attending: Gastroenterology | Admitting: Gastroenterology

## 2016-04-21 DIAGNOSIS — K858 Other acute pancreatitis without necrosis or infection: Secondary | ICD-10-CM

## 2016-04-21 DIAGNOSIS — N200 Calculus of kidney: Secondary | ICD-10-CM

## 2016-04-21 MED ORDER — IOPAMIDOL (ISOVUE-300) INJECTION 61%
100.0000 mL | Freq: Once | INTRAVENOUS | Status: AC | PRN
  Administered 2016-04-21: 100 mL via INTRAVENOUS

## 2016-04-27 ENCOUNTER — Ambulatory Visit (HOSPITAL_COMMUNITY): Payer: BLUE CROSS/BLUE SHIELD | Admitting: Anesthesiology

## 2016-04-27 ENCOUNTER — Encounter (HOSPITAL_COMMUNITY): Payer: Self-pay | Admitting: *Deleted

## 2016-04-27 ENCOUNTER — Encounter (HOSPITAL_COMMUNITY)
Admission: RE | Disposition: A | Discharge status: Home / Self Care | Payer: Self-pay | Source: Ambulatory Visit | Attending: Urology

## 2016-04-27 ENCOUNTER — Other Ambulatory Visit: Payer: Self-pay | Admitting: Urology

## 2016-04-27 ENCOUNTER — Ambulatory Visit (HOSPITAL_COMMUNITY)
Admission: RE | Admit: 2016-04-27 | Discharge: 2016-04-27 | Disposition: A | Discharge status: Home / Self Care | Payer: BLUE CROSS/BLUE SHIELD | Source: Ambulatory Visit | Attending: Urology | Admitting: Urology

## 2016-04-27 DIAGNOSIS — N200 Calculus of kidney: Secondary | ICD-10-CM

## 2016-04-27 DIAGNOSIS — Z7983 Long term (current) use of bisphosphonates: Secondary | ICD-10-CM | POA: Diagnosis not present

## 2016-04-27 DIAGNOSIS — Z9049 Acquired absence of other specified parts of digestive tract: Secondary | ICD-10-CM | POA: Insufficient documentation

## 2016-04-27 DIAGNOSIS — I739 Peripheral vascular disease, unspecified: Secondary | ICD-10-CM | POA: Insufficient documentation

## 2016-04-27 DIAGNOSIS — Z8673 Personal history of transient ischemic attack (TIA), and cerebral infarction without residual deficits: Secondary | ICD-10-CM | POA: Diagnosis not present

## 2016-04-27 DIAGNOSIS — Z79899 Other long term (current) drug therapy: Secondary | ICD-10-CM | POA: Insufficient documentation

## 2016-04-27 DIAGNOSIS — M1990 Unspecified osteoarthritis, unspecified site: Secondary | ICD-10-CM | POA: Insufficient documentation

## 2016-04-27 DIAGNOSIS — F329 Major depressive disorder, single episode, unspecified: Secondary | ICD-10-CM | POA: Insufficient documentation

## 2016-04-27 DIAGNOSIS — N201 Calculus of ureter: Secondary | ICD-10-CM | POA: Diagnosis present

## 2016-04-27 DIAGNOSIS — K219 Gastro-esophageal reflux disease without esophagitis: Secondary | ICD-10-CM | POA: Diagnosis not present

## 2016-04-27 HISTORY — DX: Unspecified osteoarthritis, unspecified site: M19.90

## 2016-04-27 HISTORY — PX: CYSTOSCOPY W/ URETERAL STENT PLACEMENT: SHX1429

## 2016-04-27 LAB — BASIC METABOLIC PANEL
Anion gap: 12 (ref 5–15)
BUN: 25 mg/dL — ABNORMAL HIGH (ref 6–20)
CO2: 14 mmol/L — ABNORMAL LOW (ref 22–32)
Calcium: 9.7 mg/dL (ref 8.9–10.3)
Chloride: 103 mmol/L (ref 101–111)
Creatinine, Ser: 1.86 mg/dL — ABNORMAL HIGH (ref 0.44–1.00)
GFR calc Af Amer: 30 mL/min — ABNORMAL LOW (ref >60)
GFR calc non Af Amer: 26 mL/min — ABNORMAL LOW (ref >60)
Glucose, Bld: 121 mg/dL — ABNORMAL HIGH (ref 65–99)
Potassium: 3 mmol/L — ABNORMAL LOW (ref 3.5–5.1)
Sodium: 129 mmol/L — ABNORMAL LOW (ref 135–145)

## 2016-04-27 LAB — HEMOGLOBIN: Hemoglobin: 17 g/dL — ABNORMAL HIGH (ref 12.0–15.0)

## 2016-04-27 SURGERY — CYSTOSCOPY, WITH RETROGRADE PYELOGRAM AND URETERAL STENT INSERTION
Anesthesia: General | Site: Urethra | Laterality: Right

## 2016-04-27 MED ORDER — PROPOFOL 10 MG/ML IV BOLUS
INTRAVENOUS | Status: DC | PRN
  Administered 2016-04-27: 150 mg via INTRAVENOUS

## 2016-04-27 MED ORDER — BELLADONNA ALKALOIDS-OPIUM 16.2-60 MG RE SUPP
RECTAL | Status: DC | PRN
  Administered 2016-04-27: 1 via RECTAL

## 2016-04-27 MED ORDER — FENTANYL CITRATE (PF) 100 MCG/2ML IJ SOLN
INTRAMUSCULAR | Status: DC | PRN
  Administered 2016-04-27: 25 ug via INTRAVENOUS

## 2016-04-27 MED ORDER — PROPOFOL 10 MG/ML IV BOLUS
INTRAVENOUS | Status: AC
  Filled 2016-04-27: qty 20

## 2016-04-27 MED ORDER — FENTANYL CITRATE (PF) 100 MCG/2ML IJ SOLN
INTRAMUSCULAR | Status: AC
  Filled 2016-04-27: qty 2

## 2016-04-27 MED ORDER — CIPROFLOXACIN IN D5W 400 MG/200ML IV SOLN
400.0000 mg | Freq: Once | INTRAVENOUS | Status: AC
  Administered 2016-04-27: 400 mg via INTRAVENOUS
  Filled 2016-04-27: qty 200

## 2016-04-27 MED ORDER — FENTANYL CITRATE (PF) 100 MCG/2ML IJ SOLN
INTRAMUSCULAR | Status: DC
Start: 2016-04-27 — End: 2016-04-27
  Filled 2016-04-27: qty 2

## 2016-04-27 MED ORDER — LIDOCAINE HCL 2 % EX GEL
CUTANEOUS | Status: DC | PRN
  Administered 2016-04-27: 1 via URETHRAL

## 2016-04-27 MED ORDER — LIDOCAINE HCL 2 % EX GEL
CUTANEOUS | Status: AC
  Filled 2016-04-27: qty 5

## 2016-04-27 MED ORDER — LACTATED RINGERS IV SOLN
INTRAVENOUS | Status: DC | PRN
  Administered 2016-04-27: 17:00:00 via INTRAVENOUS

## 2016-04-27 MED ORDER — FENTANYL CITRATE (PF) 100 MCG/2ML IJ SOLN
25.0000 ug | INTRAMUSCULAR | Status: DC | PRN
  Administered 2016-04-27: 50 ug via INTRAVENOUS

## 2016-04-27 MED ORDER — TRAMADOL HCL 50 MG PO TABS: 50.0000 mg | ORAL_TABLET | Freq: Four times a day (QID) | ORAL | Status: DC | PRN

## 2016-04-27 MED ORDER — CIPROFLOXACIN IN D5W 400 MG/200ML IV SOLN: 400.0000 mg | INTRAVENOUS | Status: DC

## 2016-04-27 MED ORDER — ONDANSETRON HCL 4 MG/2ML IJ SOLN
INTRAMUSCULAR | Status: DC | PRN
  Administered 2016-04-27: 4 mg via INTRAVENOUS

## 2016-04-27 MED ORDER — ONDANSETRON HCL 4 MG/2ML IJ SOLN
INTRAMUSCULAR | Status: AC
  Filled 2016-04-27: qty 2

## 2016-04-27 MED ORDER — DIATRIZOATE MEGLUMINE 30 % UR SOLN
URETHRAL | Status: AC
  Filled 2016-04-27: qty 100

## 2016-04-27 MED ORDER — DIATRIZOATE MEGLUMINE 30 % UR SOLN
URETHRAL | Status: DC | PRN
  Administered 2016-04-27: 10 mL via URETHRAL

## 2016-04-27 MED ORDER — LIDOCAINE HCL (CARDIAC) 20 MG/ML IV SOLN
INTRAVENOUS | Status: DC | PRN
  Administered 2016-04-27: 60 mg via INTRATRACHEAL

## 2016-04-27 MED ORDER — PHENAZOPYRIDINE HCL 200 MG PO TABS: 200.0000 mg | ORAL_TABLET | Freq: Three times a day (TID) | ORAL | Status: DC | PRN

## 2016-04-27 MED ORDER — LIDOCAINE HCL (CARDIAC) 20 MG/ML IV SOLN
INTRAVENOUS | Status: AC
  Filled 2016-04-27: qty 5

## 2016-04-27 MED ORDER — ONDANSETRON HCL 4 MG/2ML IJ SOLN
4.0000 mg | Freq: Once | INTRAMUSCULAR | Status: AC | PRN
  Administered 2016-04-27: 4 mg via INTRAVENOUS

## 2016-04-27 MED ORDER — SODIUM CHLORIDE 0.9 % IR SOLN
Status: DC | PRN
  Administered 2016-04-27: 1000 mL via INTRAVESICAL
  Administered 2016-04-27: 3000 mL via INTRAVESICAL

## 2016-04-27 MED ORDER — CIPROFLOXACIN IN D5W 400 MG/200ML IV SOLN
INTRAVENOUS | Status: AC
  Filled 2016-04-27: qty 200

## 2016-04-27 MED ORDER — BELLADONNA ALKALOIDS-OPIUM 16.2-60 MG RE SUPP
RECTAL | Status: AC
  Filled 2016-04-27: qty 1

## 2016-04-27 SURGICAL SUPPLY — 18 items
BAG URO CATCHER STRL LF (MISCELLANEOUS) ×3 IMPLANT
BASKET DAKOTA 1.9FR 11X120 (BASKET) IMPLANT
BASKET ZERO TIP NITINOL 2.4FR (BASKET) IMPLANT
BSKT STON RTRVL ZERO TP 2.4FR (BASKET)
CATH URET 5FR 28IN OPEN ENDED (CATHETERS) ×3 IMPLANT
CLOTH BEACON ORANGE TIMEOUT ST (SAFETY) ×3 IMPLANT
GLOVE BIOGEL M STRL SZ7.5 (GLOVE) ×3 IMPLANT
GOWN STRL REUS W/TWL XL LVL3 (GOWN DISPOSABLE) ×3 IMPLANT
GUIDEWIRE ANG ZIPWIRE 038X150 (WIRE) IMPLANT
GUIDEWIRE STR DUAL SENSOR (WIRE) ×3 IMPLANT
MANIFOLD NEPTUNE II (INSTRUMENTS) ×3 IMPLANT
PACK CYSTO (CUSTOM PROCEDURE TRAY) ×3 IMPLANT
SHEATH ACCESS URETERAL 24CM (SHEATH) IMPLANT
SHEATH ACCESS URETERAL 38CM (SHEATH) IMPLANT
SHEATH ACCESS URETERAL 54CM (SHEATH) IMPLANT
STENT URET 6FRX24 CONTOUR (STENTS) ×2 IMPLANT
TUBING CONNECTING 10 (TUBING) ×2 IMPLANT
TUBING CONNECTING 10' (TUBING) ×1

## 2016-04-27 NOTE — Transfer of Care (Signed)
Immediate Anesthesia Transfer of Care Note  Patient: Danielle Gaines  Procedure(s) Performed: Procedure(s): CYSTOSCOPY WITH RETROGRADE PYELOGRAM/ RIGHT URETERAL STENT PLACEMENT (Right)  Patient Location: PACU  Anesthesia Type:General  Level of Consciousness: awake, alert  and patient cooperative  Airway & Oxygen Therapy: Patient Spontanous Breathing and Patient connected to face mask oxygen  Post-op Assessment: Report given to RN and Post -op Vital signs reviewed and stable  Post vital signs: Reviewed  Last Vitals:  Filed Vitals:   04/27/16 1534  BP: 129/63  Pulse: 104  Temp: 36.7 C  Resp: 18    Last Pain: There were no vitals filed for this visit.       Complications: No apparent anesthesia complications

## 2016-04-27 NOTE — H&P (Signed)
Reason For Visit Right renal colic   History of Present Illness 75 year old female referred for evaluation of renal colic nephrolithiasis by Dr. Evangeline Dakin. her primary care provider is Colin Benton, D.O.    The patient presents today for evaluation of nephrolithiasis. She describes a right flank pain that radiates around to the front, right mid abdomen. It is intermittent and intense. CT scan was obtained on 04/21/16 demonstrating right mild hydronephrosis and a 5 mm stone in the proximal right ureter. The patient also has bilateral nonobstructing stones.    The patient states that she's had worsening pain over the past 3 weeks. The pain initially was in her right flank and now radiates across the upper abdomen. She has had associated nausea, lack of appetite and poor energy. She denies any fevers or chills. She's not had any progressive voiding symptoms. She does not have a history of nephrolithiasis. The patient was told that she has pancreatitis today by Dr. Earlean Shawl but also an additional obstructing stone.    The patient's past surgical history is significant for cholecystectomy, appendectomy, colectomy and hysterectomy.   Past Medical History Problems  1. History of Anxiety and depression (F41.9,F32.9) 2. History of arthritis (Z87.39) 3. History of cardiac murmur (Z86.79) 4. History of heartburn [id number])  Surgical History Problems  1. History of Appendectomy 2. History of Cholecystotomy 3. History of Colon Surgery 4. History of Ovarian Surgery 5. History of Total Abdominal Hysterectomy  Current Meds 1. Adderall 20 MG Oral Tablet;  Therapy: (Recorded:30May2017) to Recorded 2. Biotin 5000 MCG Oral Capsule;  Therapy: (Recorded:30May2017) to Recorded 3. Fosamax 70 MG Oral Tablet;  Therapy: (Recorded:30May2017) to Recorded 4. Hyoscyamine 0.375 MG CP12;  Therapy: (Recorded:30May2017) to Recorded 5. Linzess 290 MCG Oral Capsule;  Therapy: (Recorded:30May2017) to  Recorded 6. Methocarbamol 500 MG Oral Tablet;  Therapy: (Recorded:30May2017) to Recorded 7. PriLOSEC 40 MG CPDR;  Therapy: (Recorded:30May2017) to Recorded 8. Serzone 100 MG TABS;  Therapy: (Recorded:30May2017) to Recorded 9. Topamax 100 MG Oral Tablet;  Therapy: (Recorded:30May2017) to Recorded 10. TraZODone HCl - 50 MG Oral Tablet;   Therapy: (Recorded:30May2017) to Recorded 11. Tylenol Extra Strength 500 MG TABS;   Therapy: (Recorded:30May2017) to Recorded  Allergies Medication  1. No Known Drug Allergies  Family History Problems  1. Family history of cardiac disorder (Z82.49) : Mother  Social History Problems    Denied: History of Alcohol use   Caffeine use (F15.90)   2   Married   Never a smoker   Number of children   1 son  Review of Systems  Genitourinary: nocturia, urinary hesitancy and initiating urination requires straining.  Gastrointestinal: nausea, heartburn and constipation.  Constitutional: night sweats, feeling tired (fatigue) and recent weight loss.  Integumentary: pruritus.  Eyes: blurred vision.  ENT: sinus problems.  Hematologic/Lymphatic: a tendency to easily bruise.  Cardiovascular: chest pain.  Respiratory: shortness of breath and cough.  Endocrine: polydipsia.  Musculoskeletal: back pain and joint pain.  Neurological: dizziness and headache.  Psychiatric: depression and anxiety.    Vitals Vital Signs [Data Includes: Last 1 Day]  Recorded: [id number] 12:17PM  Height: 5 ft 1 in Weight: 120 lb  BMI Calculated: 22.67 BSA Calculated: 1.52 Blood Pressure: 62 / 31 Temperature: 97.5 F Heart Rate: 106  Physical Exam Constitutional: Well nourished and well developed . No acute distress.  ENT:. The ears and nose are normal in appearance.  Neck: The appearance of the neck is normal and no neck mass is present.  Pulmonary: No respiratory distress and  normal respiratory rhythm and effort.  Cardiovascular: Heart rate and rhythm are normal  . No peripheral edema.  Abdomen: The abdomen is flat. Moderate tenderness in the RUQ is present and tenderness in the RLQ is present. Tenderness is present diffusely throughout the abdomen. Bilateral CVA tenderness.  Lymphatics: The femoral and inguinal nodes are not enlarged or tender.  Skin: Normal skin turgor, no visible rash and no visible skin lesions.  Neuro/Psych:. Mood and affect are appropriate.    Results/Data I have independently reviewed the patient's CT scan which demonstrates a 6 mm stone that appears to be in the right ureter with mild to moderate hydronephrosis. She also has a nonobstructing stone in the left kidney that is 12 mm.     Assessment Assessed  1. Nephrolithiasis (N20.0)  Plan Health Maintenance  1. UA With REFLEX; [Do Not Release]; Status:In Progress - Specimen/Data Collected;    Done: [id number]  Discussion/Summary The patient has severe right-sided flank and abdominal pain. Her CT images are not entirely clear but it does appear that the patient has a stone in the right ureter. She may also well have pancreatitis. She also could potentially just have pancreatitis with no obstructing stone. However, I don't think any additional imaging is can clarify this. I recommended that she proceed to the operating room on an urgent basis for stent placement. If that point pain is significantly improved and we would know if her pain was associated with a stone. I'm reluctant to proceed with laser lithotripsy simultaneously given the concern for pancreatitis and acute exacerbation of it. I spoke to the patient and her husband about cystoscopy, right retrograde, right ureteral stent placement and the associated risk and benefits of this operation. Patient understands that she will need a second follow-up surgery to treat the stone. She also understands that this could potentially exacerbate any pancreatitis. We will plan to get this scheduled for this afternoon.

## 2016-04-27 NOTE — Op Note (Signed)
Preoperative diagnosis:  1. Right-sided flank pain/lumbago 2. Right ureteral stone, mid ureter   Postoperative diagnosis:  1. Same   Procedure:  1. Cystoscopy 2. right ureteral stent placement 3. right retrograde pyelography with interpretation   Surgeon: Ardis Hughs, MD  Anesthesia: General  Complications: None  Intraoperative findings: 10 mL of Cystografin was instilled into a 5 [place] open-ended ureteral catheter which had been inserted into the right distal ureteral orifice and fluoroscopic images obtained. There is no hydronephrosis or any appreciable filling defect noted.  EBL: Minimal  Specimens: None  Indication: Danielle Gaines is a 74 y.o. patient with history of several weeks of right-sided flank and abdominal pain. She had associated nausea and vomiting. A CT scan was obtained and straightening a possible right-sided proximal/mid ureteral stone, difficult to truly tell whether it was in the ureter or not. There was no appreciable hydronephrosis. The patient was also subsequently diagnosed with pancreatitis. I spoke to the patient and her husband at length about the possibility of a proximal ureteral stone concerning to her symptoms. She may have no stone at all and the images were difficult to really tell definitively. However, we discussed moving forward with stent placement as this would almost certainly take the kidney stone pain out of her equation. I discussed the procedure with her in detail including the risks and the benefits.  Description of procedure:  The patient was taken to the operating room and general anesthesia was induced.  The patient was placed in the dorsal lithotomy position, prepped and draped in the usual sterile fashion, and preoperative antibiotics were administered. A preoperative time-out was performed.   Cystourethroscopy was performed.  The patient's urethra was examined and was normal. The bladder was then systematically examined in its  entirety. There was no evidence for any bladder tumors, stones, or other mucosal pathology.    Attention then turned to the rightureteral orifice and a ureteral catheter was used to intubate the ureteral orifice.  Omnipaque contrast was injected through the ureteral catheter and a retrograde pyelogram was performed with findings as dictated above.  A 0.38 sensor guidewire was then advanced up the right ureter into the renal pelvis under fluoroscopic guidance.  The wire was then backloaded through the cystoscope and a ureteral stent was advance over the wire using Seldinger technique.  The stent was positioned appropriately under fluoroscopic and cystoscopic guidance.  The wire was then removed with an adequate stent curl noted in the renal pelvis as well as in the bladder.  The bladder was then emptied and the procedure ended.  The patient appeared to tolerate the procedure well and without complications.  The patient was able to be awakened and transferred to the recovery unit in satisfactory condition.    Ardis Hughs, M.D.

## 2016-04-27 NOTE — Anesthesia Preprocedure Evaluation (Addendum)
Anesthesia Evaluation  Patient identified by MRN, date of birth, ID band Patient awake    Reviewed: Allergy & Precautions, H&P , NPO status , Patient's Chart, lab work & pertinent test results  History of Anesthesia Complications Negative for: history of anesthetic complications  Airway Mallampati: II  TM Distance: >3 FB Neck ROM: full    Dental no notable dental hx.    Pulmonary neg pulmonary ROS,    Pulmonary exam normal breath sounds clear to auscultation       Cardiovascular + Peripheral Vascular Disease  Normal cardiovascular exam Rhythm:regular Rate:Normal  2017 Echo with normal EF, some degree of diastolic dysfunction  Eval'd by cardiology in 01/2016 and found to not have any significant concerns for ischemic heart disease or elevated risk for minor-moderate surgical procedures   Neuro/Psych  Headaches, Depression TIA   GI/Hepatic Neg liver ROS, GERD  ,  Endo/Other  negative endocrine ROS  Renal/GU Renal disease     Musculoskeletal  (+) Arthritis ,   Abdominal   Peds  Hematology negative hematology ROS (+)   Anesthesia Other Findings Chronic pain  Reproductive/Obstetrics negative OB ROS                            Anesthesia Physical Anesthesia Plan  ASA: II  Anesthesia Plan: General   Post-op Pain Management:    Induction: Intravenous  Airway Management Planned: LMA  Additional Equipment:   Intra-op Plan:   Post-operative Plan: Extubation in OR  Informed Consent: I have reviewed the patients History and Physical, chart, labs and discussed the procedure including the risks, benefits and alternatives for the proposed anesthesia with the patient or authorized representative who has indicated his/her understanding and acceptance.   Dental Advisory Given  Plan Discussed with: Anesthesiologist, CRNA and Surgeon  Anesthesia Plan Comments:        Anesthesia Quick  Evaluation

## 2016-04-27 NOTE — Anesthesia Procedure Notes (Signed)
Procedure Name: LMA Insertion Date/Time: 04/27/2016 5:00 PM Performed by: Dione Booze Pre-anesthesia Checklist: Patient identified, Emergency Drugs available, Suction available and Patient being monitored Patient Re-evaluated:Patient Re-evaluated prior to inductionOxygen Delivery Method: Circle system utilized Preoxygenation: Pre-oxygenation with 100% oxygen Intubation Type: IV induction LMA Size: 4.0 Number of attempts: 1 Placement Confirmation: positive ETCO2 Tube secured with: Tape Dental Injury: Teeth and Oropharynx as per pre-operative assessment

## 2016-04-27 NOTE — Anesthesia Postprocedure Evaluation (Signed)
Anesthesia Post Note  Patient: Danielle Gaines  Procedure(s) Performed: Procedure(s) (LRB): CYSTOSCOPY WITH RETROGRADE PYELOGRAM/ RIGHT URETERAL STENT PLACEMENT (Right)  Patient location during evaluation: PACU Anesthesia Type: General Level of consciousness: awake and alert Pain management: pain level controlled Vital Signs Assessment: post-procedure vital signs reviewed and stable Respiratory status: spontaneous breathing, nonlabored ventilation, respiratory function stable and patient connected to nasal cannula oxygen Cardiovascular status: blood pressure returned to baseline and stable Postop Assessment: no signs of nausea or vomiting Anesthetic complications: no    Last Vitals:  Filed Vitals:   04/27/16 1815 04/27/16 1826  BP: 103/55 111/47  Pulse: 72 72  Temp: 36.5 C 36.6 C  Resp: 10 16    Last Pain:  Filed Vitals:   04/27/16 1827  PainSc: Asleep                 Zenaida Deed

## 2016-04-27 NOTE — Discharge Instructions (Signed)
DISCHARGE INSTRUCTIONS FOR KIDNEY STONE/URETERAL STENT   MEDICATIONS:  1.  Resume all your other meds from home - except do not take any extra narcotic pain meds that you may have at home.    ACTIVITY:  1. No strenuous activity x 1week  2. No driving while on narcotic pain medications  3. Drink plenty of water  4. Continue to walk at home - you can still get blood clots when you are at home, so keep active, but don't over do it.  5. May return to work/school tomorrow or when you feel ready   BATHING:  1. You can shower and we recommend daily showers  2. You have a string coming from your urethra: The stent string is attached to your ureteral stent. Do not pull on this.   SIGNS/SYMPTOMS TO CALL:  Please call us if you have a fever greater than 101.5, uncontrolled nausea/vomiting, uncontrolled pain, dizziness, unable to urinate, bloody urine, chest pain, shortness of breath, leg swelling, leg pain, redness around wound, drainage from wound, or any other concerns or questions.   You can reach [place] at [phone].   FOLLOW-UP:  1. You will be contacted tomorrow afternoon to ensure that you are feeling better - follow-up will be made as appropriate.

## 2016-04-28 ENCOUNTER — Encounter (HOSPITAL_COMMUNITY): Payer: Self-pay | Admitting: Urology

## 2016-04-30 ENCOUNTER — Telehealth: Payer: Self-pay | Admitting: Oncology

## 2016-04-30 ENCOUNTER — Encounter: Payer: Self-pay | Admitting: Oncology

## 2016-04-30 NOTE — Telephone Encounter (Signed)
Verified address and insurance with referring provider,answer all question regarding referrals with  referring provider,  Referring aware of appt date/time and will contact pt., new pt packet mailed

## 2016-05-13 ENCOUNTER — Ambulatory Visit: Payer: BLUE CROSS/BLUE SHIELD | Admitting: Oncology

## 2016-05-20 ENCOUNTER — Ambulatory Visit (HOSPITAL_BASED_OUTPATIENT_CLINIC_OR_DEPARTMENT_OTHER): Payer: BLUE CROSS/BLUE SHIELD | Admitting: Oncology

## 2016-05-20 ENCOUNTER — Telehealth: Payer: Self-pay | Admitting: Oncology

## 2016-05-20 ENCOUNTER — Ambulatory Visit (HOSPITAL_BASED_OUTPATIENT_CLINIC_OR_DEPARTMENT_OTHER): Payer: BLUE CROSS/BLUE SHIELD

## 2016-05-20 VITALS — BP 109/48 | HR 120 | Temp 97.7°F | Resp 18 | Ht 61.0 in | Wt 118.6 lb

## 2016-05-20 DIAGNOSIS — D751 Secondary polycythemia: Secondary | ICD-10-CM

## 2016-05-20 DIAGNOSIS — N189 Chronic kidney disease, unspecified: Secondary | ICD-10-CM

## 2016-05-20 LAB — COMPREHENSIVE METABOLIC PANEL
ALT: 37 U/L (ref 0–55)
AST: 20 U/L (ref 5–34)
Albumin: 4.4 g/dL (ref 3.5–5.0)
Alkaline Phosphatase: 69 U/L (ref 40–150)
Anion Gap: 11 mEq/L (ref 3–11)
BUN: 19.9 mg/dL (ref 7.0–26.0)
CO2: 13 mEq/L — ABNORMAL LOW (ref 22–29)
Calcium: 9.8 mg/dL (ref 8.4–10.4)
Chloride: 112 mEq/L — ABNORMAL HIGH (ref 98–109)
Creatinine: 1.5 mg/dL — ABNORMAL HIGH (ref 0.6–1.1)
EGFR: 34 mL/min/{1.73_m2} — ABNORMAL LOW (ref >90)
Glucose: 104 mg/dl (ref 70–140)
Potassium: 3.5 mEq/L (ref 3.5–5.1)
Sodium: 136 mEq/L (ref 136–145)
Total Bilirubin: 0.38 mg/dL (ref 0.20–1.20)
Total Protein: 8.2 g/dL (ref 6.4–8.3)

## 2016-05-20 LAB — CBC WITH DIFFERENTIAL/PLATELET
BASO%: 0.2 % (ref 0.0–2.0)
Basophils Absolute: 0 10*3/uL (ref 0.0–0.1)
EOS%: 0.1 % (ref 0.0–7.0)
Eosinophils Absolute: 0 10*3/uL (ref 0.0–0.5)
HCT: 42.3 % (ref 34.8–46.6)
HGB: 14.3 g/dL (ref 11.6–15.9)
LYMPH%: 18.3 % (ref 14.0–49.7)
MCH: 32.2 pg (ref 25.1–34.0)
MCHC: 33.7 g/dL (ref 31.5–36.0)
MCV: 95.4 fL (ref 79.5–101.0)
MONO#: 0.5 10*3/uL (ref 0.1–0.9)
MONO%: 6.3 % (ref 0.0–14.0)
NEUT#: 6.1 10*3/uL (ref 1.5–6.5)
NEUT%: 75.1 % (ref 38.4–76.8)
Platelets: 281 10*3/uL (ref 145–400)
RBC: 4.43 10*6/uL (ref 3.70–5.45)
RDW: 13.3 % (ref 11.2–14.5)
WBC: 8.1 10*3/uL (ref 3.9–10.3)
lymph#: 1.5 10*3/uL (ref 0.9–3.3)

## 2016-05-20 NOTE — Consult Note (Signed)
Reason for Referral: Polycythemia.   HPI: 75 year old woman currently of [place] where she lived for the last 13 years. She is a pleasant woman with few comorbid conditions include osteoporosis, depression and congestive heart failure. She has been recently under evaluation for back pain and abdominal pain and had a CBC obtained at that time and noted an elevated hemoglobin of 17. She had a normal white cell count and platelet count. Her chemistries did show mild hypercalcemia as well as chronic renal insufficiency. She did have a serum protein electrophoresis done in October 2016 that was normal. She has been evaluated in the past by Dr.Gorsuch for recurrent epistaxis. She reports that she still have occasional epistaxis but no persistent bleeding. She has had multiple orthopedic procedures as well as dental surgery without any excessive bleeding. She does report mucosal bleeding at this time especially with she brushes her teeth. She denied any hormone supplements at this time or carbon dioxide exposure. She had been on estrogen in the past but not in the last 2 years. Her hemoglobin in the last 5 years was reviewed and have been within normal range except for in May 2016 was elevated to 19.4. This has normalized spontaneously since that time. She does have multiple chronic complaints including back pain and abdominal pain that have been evaluated without any clear cut etiology. She was diagnosed recently with nephrolithiasis and underwent cystoscopy and a right ureteral stent placed by Dr. Louis Meckel on 0000000 without complications.  She is not report any headaches, blurry vision, syncope or seizures. She does not report any fevers, chills or sweats or weight loss. She does not report any chest pain, palpitation orthopnea. She does not report any cough, wheezing or hemoptysis. She does report some occasional nausea but no vomiting. She does report abdominal pain and occasional change in her bowel  habits. She does not report any frequency urgency or hesitancy. She does not report any skeletal complaints. Remaining review of systems unremarkable.   Past Medical History  Diagnosis Date  . Osteoporosis   . Migraine     managed by neurologist  . Depression     managed by Dr. Toy Care  . Benzodiazepine dependence ([place])   . IBS (irritable bowel syndrome)     sees Dr. Earlean Shawl  . Colon abnormality     didn't work right so part of it was removed at [facility]  . GERD (gastroesophageal reflux disease)   . Diastolic heart failure ([place])     mild on echo 2016  . Carotid arterial disease ([facility])     mild  . Chronic pain     managed by pain clinic  . Macular degeneration     goes to [facility] for this  . Arthritis   :  Past Surgical History  Procedure Laterality Date  . Appendectomy    . Cholecystectomy    . Abdominal hysterectomy    . Abdominal adhesion surgery    . Small intestine surgery    . Colon surgery    . Ovarian cyst surgery    . Cystoscopy w/ ureteral stent placement Right 04/27/2016    Procedure: CYSTOSCOPY WITH RETROGRADE PYELOGRAM/ RIGHT URETERAL STENT PLACEMENT;  Surgeon: Ardis Hughs, MD;  Location: [facility];  Service: Urology;  Laterality: Right;  :   Current outpatient prescriptions:  .  acetaminophen (TYLENOL) 500 MG tablet, Take 1,000 mg by mouth every 6 (six) hours as needed for moderate pain., Disp: , Rfl:  .  alendronate (FOSAMAX) 70 MG  tablet, take 1 tablet by mouth every week, Disp: 4 tablet, Rfl: 5 .  amphetamine-dextroamphetamine (ADDERALL XR) 20 MG 24 hr capsule, Take 40 mg by mouth every morning., Disp: , Rfl: 0 .  BIOTIN PO, Take 1 capsule by mouth daily. , Disp: , Rfl:  .  cycloSPORINE (RESTASIS) 0.05 % ophthalmic emulsion, Place 1 drop into both eyes 2 (two) times daily. , Disp: , Rfl:  .  hyoscyamine (LEVBID) 0.375 MG 12 hr tablet, take 1 tablet by mouth twice a day, Disp: 60 tablet, Rfl: 2 .  LINZESS 290 MCG CAPS capsule, Take 290 mg by mouth  daily before breakfast., Disp: , Rfl: 0 .  metaxalone (SKELAXIN) 800 MG tablet, take 1 tablet by mouth three times a day if needed for pain, Disp: , Rfl: 0 .  methocarbamol (ROBAXIN) 500 MG tablet, Take 500 mg by mouth every 6 (six) hours as needed for muscle spasms. , Disp: , Rfl:  .  nefazodone (SERZONE) 100 MG tablet, Take 300 mg by mouth at bedtime. , Disp: , Rfl:  .  OLANZapine-FLUoxetine (SYMBYAX) 3-25 MG capsule, Take 1 capsule by mouth at bedtime., Disp: , Rfl: 0 .  omeprazole (PRILOSEC) 40 MG capsule, Take 40 mg by mouth every morning., Disp: , Rfl: 0 .  Pancrelipase, Lip-Prot-Amyl, (ZENPEP) 15000 UNITS CPEP, Take 45,000 Units by mouth 3 (three) times daily after meals., Disp: , Rfl:  .  SUMAtriptan (IMITREX) 100 MG tablet, Take 100 mg by mouth every 2 (two) hours as needed for migraine or headache. May repeat in 2 hours if headache persists or recurs., Disp: , Rfl:  .  topiramate (TOPAMAX) 100 MG tablet, Take 1 tablet (100 mg total) by mouth 2 (two) times daily. (Patient taking differently: Take 100-200 mg by mouth 2 (two) times daily. Take 1 tablet in the morning and Take 2 tablets at bedtime), Disp: 60 tablet, Rfl: 1 .  traMADol (ULTRAM) 50 MG tablet, Take 1-2 tablets (50-100 mg total) by mouth every 6 (six) hours as needed for moderate pain., Disp: 30 tablet, Rfl: 0 .  TROKENDI XR 100 MG CP24, , Disp: , Rfl: 0:  Allergies  Allergen Reactions  . Sulfonamide Derivatives Swelling  :  Family History  Problem Relation Age of Onset  . Kidney disease Mother   . Heart disease Mother   . Cancer Maternal Aunt     intestinal and liver cancer  . Stroke Maternal Grandmother   . Stroke    . Depression      everyone  . Hypercalcemia Neg Hx   :  Social History   Social History  . Marital Status: Married    Spouse Name: N/A  . Number of Children: N/A  . Years of Education: N/A   Occupational History  . Not on file.   Social History Main Topics  . Smoking status: Never Smoker    . Smokeless tobacco: Never Used  . Alcohol Use: No  . Drug Use: No  . Sexual Activity: No   Other Topics Concern  . Not on file   Social History Narrative   Work or School: none      Home Situation: lives with husband      Spiritual Beliefs:       Lifestyle: no regular exercise; diet is ok           :  Pertinent items are noted in HPI.  Exam: Blood pressure 109/48, pulse 120, temperature 97.7 F (36.5 C), temperature source Oral, resp.  rate 18, height 5\' 1"  (1.549 m), weight 118 lb 9.6 oz (53.797 kg), SpO2 97 %. General appearance: alert and cooperative Head: Normocephalic, without obvious abnormality Throat: lips, mucosa, and tongue normal; teeth and gums normal Neck: no adenopathy Back: negative Resp: clear to auscultation bilaterally Chest wall: no tenderness Cardio: regular rate and rhythm, S1, S2 normal, no murmur, click, rub or gallop GI: soft, non-tender; bowel sounds normal; no masses,  no organomegaly Extremities: extremities normal, atraumatic, no cyanosis or edema Pulses: 2+ and symmetric Skin: Skin color, texture, turgor normal. No rashes or lesions Lymph nodes: Cervical, supraclavicular, and axillary nodes normal.  CBC    Component Value Date/Time   WBC 8.1 05/20/2016 1119   WBC 8.7 06/13/2015 1642   RBC 4.43 05/20/2016 1119   RBC 4.32 06/13/2015 1642   HGB 14.3 05/20/2016 1119   HGB 17.0* 04/27/2016 1600   HCT 42.3 05/20/2016 1119   HCT 41.1 06/13/2015 1642   PLT 281 05/20/2016 1119   PLT 325 06/13/2015 1642   MCV 95.4 05/20/2016 1119   MCV 95.1 06/13/2015 1642   MCH 32.2 05/20/2016 1119   MCH 31.7 06/13/2015 1642   MCHC 33.7 05/20/2016 1119   MCHC 33.3 06/13/2015 1642   RDW 13.3 05/20/2016 1119   RDW 13.2 06/13/2015 1642   LYMPHSABS 1.5 05/20/2016 1119   LYMPHSABS 1.4 04/07/2015 1137   MONOABS 0.5 05/20/2016 1119   MONOABS 1.0 04/07/2015 1137   EOSABS 0.0 05/20/2016 1119   EOSABS 0.0 04/07/2015 1137   BASOSABS 0.0 05/20/2016 1119    BASOSABS 0.0 04/07/2015 1137      Ct Abdomen Pelvis W Contrast  04/21/2016  CLINICAL DATA:  Generalized abdominal pain for 5 months. Anemia. Evaluate for pancreatitis, common bile duct stone or kidney stone. EXAM: CT ABDOMEN AND PELVIS WITH CONTRAST TECHNIQUE: Multidetector CT imaging of the abdomen and pelvis was performed using the standard protocol following bolus administration of intravenous contrast. CONTRAST:  124mL ISOVUE-300 IOPAMIDOL (ISOVUE-300) INJECTION 61% COMPARISON:  None. FINDINGS: Lower chest: The lung bases are clear. No pleural or pericardial effusion noted. Hepatobiliary: No focal liver abnormality identified. Previous cholecystectomy. No intrahepatic bile duct dilatation. The common bile duct measure 6 mm in maximum diameter. No filling defects identified within the common bile duct. Pancreas: No mass, inflammatory changes, or other significant abnormality. Spleen: The spleen is unremarkable. Adrenals/Urinary Tract: Normal appearance of the adrenal glands. Nonobstructing calculus within the inferior pole of the left kidney measures 6 mm. Within the inferior pole of the right kidney there is a tiny calcification measuring 1.4 mm. Right-sided pelvocaliectasis is identified. There is a calcification along the course of the proximal right ureter which measures 5 mm. I cannot confidently sedated this is intraluminal however. The urinary bladder appears normal for degree of distention. Stomach/Bowel: The stomach appears normal. The patient is status post subtotal colectomy. A Hartman's pouch is identified which appears distended. There appears to be a sites aside entero colonic anastomosis with the Hartman's pouch in the central abdomen. There is marked distension of the pouch from the anastomosis to the rectum which measures up to 7.2 cm. The small bowel loops appear increased in caliber measuring up to 2.8 cm. Vascular/Lymphatic: Calcified atherosclerotic disease involves the abdominal aorta.  No aneurysm. No enlarged retroperitoneal or mesenteric adenopathy. No enlarged pelvic or inguinal lymph nodes. Reproductive: Previous hysterectomy.  No adnexal mass. Other: No free fluid or fluid collections. No free intraperitoneal air. Musculoskeletal:  No suspicious bone lesions identified. IMPRESSION: 1. Postoperative changes  from previous subtotal colectomy with enterocolonic anastomosis. The Hartman's pouch appears moderately distended measuring up to 7.2 cm. Findings are nonspecific and may reflect functional ileus versus distal obstruction. Clinical correlation advise. 2. Status post cholecystectomy.  No choledocholithiasis identified. 3. Bilateral renal calculi. There is right-sided pelvocaliectasis. A calcification is identified along the course of the right ureter. I cannot confidently say that this is intraluminal however and this may represent a phlebolith. 4. Aortic atherosclerosis. Electronically Signed   By: Kerby Moors M.D.   On: 04/21/2016 14:29    Assessment and Plan:   75 year old woman with the following issues:  1. 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 Her hemoglobin on 04/27/2016 was up to 17. This was elevated as high as 19 in 2016. Her CBC was repeated today and was within normal range. Her white cell count and platelet count all within normal range as well. 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 I see no evidence to suggest polycythemia vera or any other myeloproliferative disorder. I see no suggestion of paraneoplastic polycythemia or evidence of malignancy. He had a CT scan of the abdomen and pelvis on 04/21/2016 which I reviewed and I see no evidence of malignancy.  From a management standpoint, I see no intervention needed at this time. A repeat CBC is warranted and she will return for a follow-up visit at that time to assess for hemoglobin.  2. Recurrent epistaxis: No clear-cut blood disorder or coagulopathy have been  determined. She had multiple surgeries in the past as well as dental surgeries without any severe bleeding. It is unlikely that she has a bleeding disorder but we will continue to monitor this in the future.  3. Follow-up: Will be in 3 months sooner if needed to.

## 2016-05-20 NOTE — Telephone Encounter (Signed)
per pof to sch pt appt-gave pt copy of avs °

## 2016-05-20 NOTE — Progress Notes (Signed)
Please see consult note.  

## 2016-05-21 LAB — ERYTHROPOIETIN: Erythropoietin: 4.2 m[IU]/mL (ref 2.6–18.5)

## 2016-05-25 ENCOUNTER — Telehealth: Payer: Self-pay | Admitting: *Deleted

## 2016-05-25 NOTE — Telephone Encounter (Signed)
This RN called patient and gave her lab results. Patient verbalized understanding.

## 2016-06-08 DIAGNOSIS — H348332 Tributary (branch) retinal vein occlusion, bilateral, stable: Secondary | ICD-10-CM | POA: Diagnosis not present

## 2016-06-08 DIAGNOSIS — H348322 Tributary (branch) retinal vein occlusion, left eye, stable: Secondary | ICD-10-CM | POA: Diagnosis not present

## 2016-06-08 DIAGNOSIS — H3581 Retinal edema: Secondary | ICD-10-CM | POA: Diagnosis not present

## 2016-06-08 DIAGNOSIS — H35342 Macular cyst, hole, or pseudohole, left eye: Secondary | ICD-10-CM | POA: Diagnosis not present

## 2016-06-08 DIAGNOSIS — Z961 Presence of intraocular lens: Secondary | ICD-10-CM | POA: Diagnosis not present

## 2016-06-08 DIAGNOSIS — H35371 Puckering of macula, right eye: Secondary | ICD-10-CM | POA: Diagnosis not present

## 2016-06-12 ENCOUNTER — Other Ambulatory Visit: Payer: Self-pay | Admitting: Endocrinology

## 2016-07-07 DIAGNOSIS — M5030 Other cervical disc degeneration, unspecified cervical region: Secondary | ICD-10-CM | POA: Diagnosis not present

## 2016-07-10 ENCOUNTER — Other Ambulatory Visit: Payer: Self-pay | Admitting: Endocrinology

## 2016-07-20 ENCOUNTER — Ambulatory Visit (INDEPENDENT_AMBULATORY_CARE_PROVIDER_SITE_OTHER): Payer: 59 | Admitting: Family Medicine

## 2016-07-20 ENCOUNTER — Encounter: Payer: Self-pay | Admitting: Family Medicine

## 2016-07-20 VITALS — BP 122/82 | HR 81 | Temp 98.1°F | Ht 61.0 in | Wt 120.4 lb

## 2016-07-20 DIAGNOSIS — R413 Other amnesia: Secondary | ICD-10-CM

## 2016-07-20 DIAGNOSIS — R42 Dizziness and giddiness: Secondary | ICD-10-CM | POA: Diagnosis not present

## 2016-07-20 DIAGNOSIS — J019 Acute sinusitis, unspecified: Secondary | ICD-10-CM

## 2016-07-20 DIAGNOSIS — L0292 Furuncle, unspecified: Secondary | ICD-10-CM | POA: Diagnosis not present

## 2016-07-20 MED ORDER — MUPIROCIN 2 % EX OINT: 1.0000 "application " | TOPICAL_OINTMENT | Freq: Two times a day (BID) | CUTANEOUS | 0 refills | Status: DC

## 2016-07-20 MED ORDER — AMOXICILLIN-POT CLAVULANATE 875-125 MG PO TABS: 1.0000 | ORAL_TABLET | Freq: Two times a day (BID) | ORAL | 0 refills | Status: DC

## 2016-07-20 NOTE — Patient Instructions (Addendum)
BEFORE YOU LEAVE: -follow up:in 3 months or sooner as needed. -labs  Please start the antibiotic.  Use the antibiotic ointment on the arm lesion twice daily.  Call your neurologist for evaluation of the memory issues.  Please follow up with your Ear, Nose and throat doctor if your sinus, ear or dizziness persists.

## 2016-07-20 NOTE — Progress Notes (Signed)
HPI:  Danielle Gaines has a complicated PMH with multiple chronic complaints managed by a number of specialist her for an acute visit for several issues. I have not seen her in > 1 year.  She has Depression (managed by psychaitry), Chronic pain (managed by pain specialist), IBs (managed by GI, Nephrolithiasis (sees urologist), migraines (sees neurologist), SOB and orthostatic hypotension (sees a cardiologist), Hx of  Hypercalcemia (s/p eval with endocrinologist and resolved) and a recent eval with hematology for polycythemia.   Several issues today:  1) sinus congestion and vertigo: -has had nasal congestion and sinus pressure ofr a few weeks -also PND, L ear fullness and intermittent vertigo -reports has had similar symptoms in the past and saw Dr. Erik Obey in ENT and symptoms resolved with abx -denies: fevers, nausea, vomiting, tinnitus, hearing loss  2) Chronic memory loss: -reports for 1-2 years has difficulty recalling names, date and events -I had advised neurology eval in the past -she sees Dr. Sima Matas for her migraines and has not discussed with her -denies sig change, worsening depression, frequent headaches, change in headaches  3) skin lesion R arm: -noticed yesterday -a little itchy   ROS: See pertinent positives and negatives per HPI.  Past Medical History:  Diagnosis Date  . Arthritis   . Benzodiazepine dependence ([place])   . Carotid arterial disease (H[facility])    mild  . Chronic pain    managed by pain clinic  . Colon abnormality    didn't work right so part of it was removed at [facility]  . Depression    managed by Dr. Toy Care  . Diastolic heart failure ([place])    mild on echo 2016  . GERD (gastroesophageal reflux disease)   . IBS (irritable bowel syndrome)    sees Dr. Earlean Shawl  . Macular degeneration    goes to [facility] for this  . Migraine    managed by neurologist  . Osteoporosis     Past Surgical History:  Procedure Laterality Date  . ABDOMINAL ADHESION  SURGERY    . ABDOMINAL HYSTERECTOMY    . APPENDECTOMY    . CHOLECYSTECTOMY    . COLON SURGERY    . CYSTOSCOPY W/ URETERAL STENT PLACEMENT Right 04/27/2016   Procedure: CYSTOSCOPY WITH RETROGRADE PYELOGRAM/ RIGHT URETERAL STENT PLACEMENT;  Surgeon: Ardis Hughs, MD;  Location: [facility];  Service: Urology;  Laterality: Right;  . OVARIAN CYST SURGERY    . SMALL INTESTINE SURGERY      Family History  Problem Relation Age of Onset  . Kidney disease Mother   . Heart disease Mother   . Cancer Maternal Aunt     intestinal and liver cancer  . Stroke Maternal Grandmother   . Stroke    . Depression      everyone  . Hypercalcemia Neg Hx     Social History   Social History  . Marital status: Married    Spouse name: N/A  . Number of children: N/A  . Years of education: N/A   Social History Main Topics  . Smoking status: Never Smoker  . Smokeless tobacco: Never Used  . Alcohol use No  . Drug use: No  . Sexual activity: No   Other Topics Concern  . None   Social History Narrative   Work or School: none      Home Situation: lives with husband      Spiritual Beliefs:       Lifestyle: no regular exercise; diet is ok  Current Outpatient Prescriptions:  .  acetaminophen (TYLENOL) 500 MG tablet, Take 1,000 mg by mouth every 6 (six) hours as needed for moderate pain., Disp: , Rfl:  .  alendronate (FOSAMAX) 70 MG tablet, take 1 tablet by mouth every week, Disp: 4 tablet, Rfl: 5 .  amphetamine-dextroamphetamine (ADDERALL XR) 20 MG 24 hr capsule, Take 40 mg by mouth as needed. , Disp: , Rfl: 0 .  BIOTIN PO, Take 1 capsule by mouth daily. , Disp: , Rfl:  .  cycloSPORINE (RESTASIS) 0.05 % ophthalmic emulsion, Place 1 drop into both eyes 2 (two) times daily. , Disp: , Rfl:  .  hyoscyamine (LEVBID) 0.375 MG 12 hr tablet, take 1 tablet by mouth twice a day, Disp: 60 tablet, Rfl: 0 .  LINZESS 290 MCG CAPS capsule, Take 290 mg by mouth daily before breakfast., Disp: ,  Rfl: 0 .  metaxalone (SKELAXIN) 800 MG tablet, take 1 tablet by mouth three times a day if needed for pain, Disp: , Rfl: 0 .  methocarbamol (ROBAXIN) 500 MG tablet, Take 500 mg by mouth every 6 (six) hours as needed for muscle spasms. , Disp: , Rfl:  .  nefazodone (SERZONE) 100 MG tablet, Take 300 mg by mouth at bedtime. , Disp: , Rfl:  .  OLANZapine-FLUoxetine (SYMBYAX) 3-25 MG capsule, Take 1 capsule by mouth at bedtime., Disp: , Rfl: 0 .  omeprazole (PRILOSEC) 40 MG capsule, Take 40 mg by mouth every morning., Disp: , Rfl: 0 .  Pancrelipase, Lip-Prot-Amyl, (ZENPEP) 15000 UNITS CPEP, Take 45,000 Units by mouth 3 (three) times daily after meals., Disp: , Rfl:  .  SUMAtriptan (IMITREX) 100 MG tablet, Take 100 mg by mouth every 2 (two) hours as needed for migraine or headache. May repeat in 2 hours if headache persists or recurs., Disp: , Rfl:  .  topiramate (TOPAMAX) 100 MG tablet, Take 1 tablet (100 mg total) by mouth 2 (two) times daily. (Patient taking differently: Take 100-200 mg by mouth. Take 1 tablet in the morning and Take 2 tablets at bedtime), Disp: 60 tablet, Rfl: 1 .  traMADol (ULTRAM) 50 MG tablet, Take 1-2 tablets (50-100 mg total) by mouth every 6 (six) hours as needed for moderate pain., Disp: 30 tablet, Rfl: 0 .  amoxicillin-clavulanate (AUGMENTIN) 875-125 MG tablet, Take 1 tablet by mouth 2 (two) times daily., Disp: 14 tablet, Rfl: 0 .  mupirocin ointment (BACTROBAN) 2 %, Place 1 application into the nose 2 (two) times daily., Disp: 22 g, Rfl: 0  EXAM:  Vitals:   07/20/16 1456  BP: 122/82  Pulse: 81  Temp: 98.1 F (36.7 C)    Body mass index is 22.75 kg/m.  GENERAL: vitals reviewed and listed above, alert, oriented, appears well hydrated and in no acute distress  HEENT: nasal deformity with deviated septum, conjunttiva clear, no obvious abnormalities on inspection of external nose and ears, normal appearance of ear canals and TMs except for effusion L, thick nasal  congestion, mild post oropharyngeal erythema with PND, no tonsillar edema or exudate, no sinus TTP  NECK: no obvious masses on inspection  LUNGS: clear to auscultation bilaterally, no wheezes, rales or rhonchi, good air movement  CV: HRRR, no peripheral edema  SKIN: very small erythematous papule with small pustule on R forearm  MS: moves all extremities without noticeable abnormality  PSYCH/NEURO: Cranial nerves II through XII grossly intact, finger to nose normal, [employer] negative, speech and thought processing grossly intact, pleasant and cooperative, no obvious depression or  anxiety  ASSESSMENT AND PLAN:  Discussed the following assessment and plan:  Acute sinusitis, recurrence not specified, unspecified location Vertigo - Plan: Basic metabolic panel, TSH, CBC (no diff) -tx sinus concerns with abx after discussion risks/benefits -labs for other causes vertigo -advise eval with ENT or neuro if vertigo persists  Memory loss -labs per above and advised neuro evaluation -she and husband plan to call her neurologist for evaluation  Boil -topical mupirocin, follow up if worsening or persists -Patient advised to return or notify a doctor immediately if symptoms worsen or persist or new concerns arise.  Patient Instructions  Follow up in 3 months or sooner as needed.  Please start the antibiotic.  Use the antibiotic ointment on the arm lesion twice daily.  Call your neurologist for evaluation of the memory issues.  Please follow up with your Ear, Nose and throat doctor if your sinus, ear or dizziness persists.   Colin Benton R., DO

## 2016-07-20 NOTE — Progress Notes (Signed)
Pre visit review using our clinic review tool, if applicable. No additional management support is needed unless otherwise documented below in the visit note. 

## 2016-07-21 LAB — BASIC METABOLIC PANEL
BUN: 16 mg/dL (ref 6–23)
CO2: 20 mEq/L (ref 19–32)
Calcium: 9.4 mg/dL (ref 8.4–10.5)
Chloride: 111 mEq/L (ref 96–112)
Creatinine, Ser: 1.2 mg/dL (ref 0.40–1.20)
GFR: 46.81 mL/min — ABNORMAL LOW (ref >60.00)
Glucose, Bld: 94 mg/dL (ref 70–99)
Potassium: 4.1 mEq/L (ref 3.5–5.1)
Sodium: 140 mEq/L (ref 135–145)

## 2016-07-21 LAB — CBC
HCT: 43.3 % (ref 36.0–46.0)
Hemoglobin: 14.6 g/dL (ref 12.0–15.0)
MCHC: 33.7 g/dL (ref 30.0–36.0)
MCV: 98 fl (ref 78.0–100.0)
Platelets: 341 10*3/uL (ref 150.0–400.0)
RBC: 4.42 Mil/uL (ref 3.87–5.11)
RDW: 13.9 % (ref 11.5–15.5)
WBC: 11.6 10*3/uL — ABNORMAL HIGH (ref 4.0–10.5)

## 2016-07-21 LAB — TSH: TSH: 2.65 u[IU]/mL (ref 0.35–4.50)

## 2016-08-05 DIAGNOSIS — M546 Pain in thoracic spine: Secondary | ICD-10-CM | POA: Diagnosis not present

## 2016-08-05 DIAGNOSIS — M47816 Spondylosis without myelopathy or radiculopathy, lumbar region: Secondary | ICD-10-CM | POA: Diagnosis not present

## 2016-08-13 ENCOUNTER — Other Ambulatory Visit: Payer: Self-pay | Admitting: Endocrinology

## 2016-08-13 NOTE — Telephone Encounter (Signed)
Please advise if ok to refill. Last office visit was 09/03/2015. Thanks!

## 2016-08-20 ENCOUNTER — Other Ambulatory Visit: Payer: BLUE CROSS/BLUE SHIELD

## 2016-08-20 ENCOUNTER — Ambulatory Visit: Payer: BLUE CROSS/BLUE SHIELD | Admitting: Oncology

## 2016-08-24 ENCOUNTER — Other Ambulatory Visit: Payer: Self-pay | Admitting: Endocrinology

## 2016-08-24 NOTE — Telephone Encounter (Signed)
Please refer request to pcp, as I do not see pt for this.

## 2016-08-26 ENCOUNTER — Other Ambulatory Visit: Payer: Self-pay | Admitting: Family Medicine

## 2016-08-27 ENCOUNTER — Telehealth: Payer: Self-pay | Admitting: Endocrinology

## 2016-08-27 NOTE — Telephone Encounter (Signed)
I contacted the patient the refill she is in need of is Hyoscyamine 0.375 mg. Per Dr. Loanne Drilling on 08/26/2016, patient needs to contact her PCP for this refill. Patient advised of this via voicemail.

## 2016-08-27 NOTE — Telephone Encounter (Signed)
Need a rehyoscyamine sr (LEVBID) 0.375 MG 12 hr tablet  Send to   [facility] [place] [facility] [place]. [phone] (Phone) [phone] (Fax)

## 2016-08-31 DIAGNOSIS — H35342 Macular cyst, hole, or pseudohole, left eye: Secondary | ICD-10-CM | POA: Diagnosis not present

## 2016-08-31 DIAGNOSIS — H348322 Tributary (branch) retinal vein occlusion, left eye, stable: Secondary | ICD-10-CM | POA: Diagnosis not present

## 2016-09-10 ENCOUNTER — Encounter: Payer: Self-pay | Admitting: *Deleted

## 2016-09-10 NOTE — Progress Notes (Unsigned)
Husband calling to re/schedule missed lab and dr visit 08/20/16. pof to schedulers for 1st available

## 2016-09-17 ENCOUNTER — Other Ambulatory Visit (HOSPITAL_COMMUNITY)
Admission: RE | Admit: 2016-09-17 | Discharge: 2016-09-17 | Disposition: A | Discharge status: Home / Self Care | Payer: 59 | Source: Ambulatory Visit | Attending: Family Medicine | Admitting: Family Medicine

## 2016-09-17 ENCOUNTER — Ambulatory Visit (HOSPITAL_BASED_OUTPATIENT_CLINIC_OR_DEPARTMENT_OTHER): Payer: 59 | Admitting: Oncology

## 2016-09-17 ENCOUNTER — Other Ambulatory Visit (HOSPITAL_BASED_OUTPATIENT_CLINIC_OR_DEPARTMENT_OTHER): Payer: 59

## 2016-09-17 ENCOUNTER — Encounter: Payer: Self-pay | Admitting: Family Medicine

## 2016-09-17 ENCOUNTER — Ambulatory Visit (INDEPENDENT_AMBULATORY_CARE_PROVIDER_SITE_OTHER): Payer: 59 | Admitting: Family Medicine

## 2016-09-17 VITALS — BP 109/48 | HR 74 | Temp 98.0°F | Resp 20 | Ht 61.0 in | Wt 119.9 lb

## 2016-09-17 VITALS — BP 102/78 | HR 91 | Temp 98.4°F | Ht 61.0 in | Wt 118.8 lb

## 2016-09-17 DIAGNOSIS — N898 Other specified noninflammatory disorders of vagina: Secondary | ICD-10-CM

## 2016-09-17 DIAGNOSIS — R1084 Generalized abdominal pain: Secondary | ICD-10-CM | POA: Diagnosis not present

## 2016-09-17 DIAGNOSIS — Z113 Encounter for screening for infections with a predominantly sexual mode of transmission: Secondary | ICD-10-CM | POA: Insufficient documentation

## 2016-09-17 DIAGNOSIS — R109 Unspecified abdominal pain: Secondary | ICD-10-CM

## 2016-09-17 DIAGNOSIS — N760 Acute vaginitis: Secondary | ICD-10-CM | POA: Diagnosis present

## 2016-09-17 DIAGNOSIS — D751 Secondary polycythemia: Secondary | ICD-10-CM

## 2016-09-17 DIAGNOSIS — Z23 Encounter for immunization: Secondary | ICD-10-CM

## 2016-09-17 DIAGNOSIS — R197 Diarrhea, unspecified: Secondary | ICD-10-CM | POA: Diagnosis not present

## 2016-09-17 LAB — URINALYSIS, ROUTINE W REFLEX MICROSCOPIC
Bilirubin Urine: NEGATIVE
Hgb urine dipstick: NEGATIVE
Ketones, ur: NEGATIVE
Nitrite: NEGATIVE
Specific Gravity, Urine: 1.015 (ref 1.000–1.030)
Total Protein, Urine: NEGATIVE
Urine Glucose: NEGATIVE
Urobilinogen, UA: 0.2 (ref 0.0–1.0)
pH: 6 (ref 5.0–8.0)

## 2016-09-17 LAB — POCT URINALYSIS DIPSTICK
Bilirubin, UA: NEGATIVE
Blood, UA: NEGATIVE
Glucose, UA: NEGATIVE
Ketones, UA: NEGATIVE
Nitrite, UA: NEGATIVE
Protein, UA: NEGATIVE
Spec Grav, UA: 1.015
Urobilinogen, UA: 0.2
pH, UA: 5

## 2016-09-17 LAB — CBC WITH DIFFERENTIAL/PLATELET
BASO%: 0.4 % (ref 0.0–2.0)
Basophils Absolute: 0 10*3/uL (ref 0.0–0.1)
EOS%: 0.6 % (ref 0.0–7.0)
Eosinophils Absolute: 0 10*3/uL (ref 0.0–0.5)
HCT: 42.2 % (ref 34.8–46.6)
HGB: 13.9 g/dL (ref 11.6–15.9)
LYMPH%: 27.5 % (ref 14.0–49.7)
MCH: 32.2 pg (ref 25.1–34.0)
MCHC: 33 g/dL (ref 31.5–36.0)
MCV: 97.5 fL (ref 79.5–101.0)
MONO#: 0.5 10*3/uL (ref 0.1–0.9)
MONO%: 8.1 % (ref 0.0–14.0)
NEUT#: 3.9 10*3/uL (ref 1.5–6.5)
NEUT%: 63.4 % (ref 38.4–76.8)
Platelets: 264 10*3/uL (ref 145–400)
RBC: 4.33 10*6/uL (ref 3.70–5.45)
RDW: 13.4 % (ref 11.2–14.5)
WBC: 6.2 10*3/uL (ref 3.9–10.3)
lymph#: 1.7 10*3/uL (ref 0.9–3.3)

## 2016-09-17 LAB — COMPREHENSIVE METABOLIC PANEL
ALT: 15 U/L (ref 0–35)
AST: 18 U/L (ref 0–37)
Albumin: 4.5 g/dL (ref 3.5–5.2)
Alkaline Phosphatase: 59 U/L (ref 39–117)
BUN: 25 mg/dL — ABNORMAL HIGH (ref 6–23)
CO2: 18 mEq/L — ABNORMAL LOW (ref 19–32)
Calcium: 9.4 mg/dL (ref 8.4–10.5)
Chloride: 110 mEq/L (ref 96–112)
Creatinine, Ser: 1.17 mg/dL (ref 0.40–1.20)
GFR: 48.18 mL/min — ABNORMAL LOW (ref >60.00)
Glucose, Bld: 90 mg/dL (ref 70–99)
Potassium: 3.6 mEq/L (ref 3.5–5.1)
Sodium: 137 mEq/L (ref 135–145)
Total Bilirubin: 0.3 mg/dL (ref 0.2–1.2)
Total Protein: 7.7 g/dL (ref 6.0–8.3)

## 2016-09-17 LAB — CBC
HCT: 41.5 % (ref 36.0–46.0)
Hemoglobin: 14.1 g/dL (ref 12.0–15.0)
MCHC: 33.9 g/dL (ref 30.0–36.0)
MCV: 96.5 fl (ref 78.0–100.0)
Platelets: 285 10*3/uL (ref 150.0–400.0)
RBC: 4.3 Mil/uL (ref 3.87–5.11)
RDW: 14.1 % (ref 11.5–15.5)
WBC: 6.4 10*3/uL (ref 4.0–10.5)

## 2016-09-17 NOTE — Progress Notes (Signed)
Hematology and Oncology Follow Up Visit  Danielle Gaines QH:[id number] 1941/01/25 75 y.o. 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 No evidence of a myeloproliferative disorder detected.   Current therapy: Observation and surveillance.  Interim History:  [name] presents today for a follow-up visit. Since the last visit, she developed intermittent abdominal pain after developing a yeast infection. She has been treated with topical creams for her yeast infection but her symptoms has not improved. She has reported some nausea but no vomiting. Has not reported any change in her bowel habits. She is able to eat and have maintained weight despite her GI symptoms. She had similar episodes back in May 2017 and a CT scan did not show any acute pathology. She was seen by her primary care provider and currently receive her workup related to that.  She is not report any headaches, blurry vision, syncope or seizures. She does not report any fevers, chills or sweats or weight loss. She does not report any chest pain, palpitation orthopnea. She does not report any cough, wheezing or hemoptysis. She does report some occasional nausea but no vomiting. She does report abdominal pain and occasional change in her bowel habits. She does not report any frequency urgency or hesitancy. She does not report any skeletal complaints. Remaining review of systems unremarkable.   Medications: I have reviewed the patient's current medications.  Current Outpatient Prescriptions  Medication Sig Dispense Refill  . acetaminophen (TYLENOL) 500 MG tablet Take 1,000 mg by mouth every 6 (six) hours as needed for moderate pain.    . ADDERALL XR 30 MG 24 hr capsule Take 30 mg by mouth every morning.  0  . alendronate (FOSAMAX) 70 MG tablet take 1 tablet by mouth every week 4 tablet 5  .  amphetamine-dextroamphetamine (ADDERALL XR) 20 MG 24 hr capsule Take 40 mg by mouth as needed.   0  . BIOTIN PO Take 1 capsule by mouth daily.     . cycloSPORINE (RESTASIS) 0.05 % ophthalmic emulsion Place 1 drop into both eyes 2 (two) times daily.     Marland Kitchen LINZESS 290 MCG CAPS capsule Take 290 mg by mouth daily before breakfast.  0  . metaxalone (SKELAXIN) 800 MG tablet take 1 tablet by mouth three times a day if needed for pain  0  . methocarbamol (ROBAXIN) 500 MG tablet Take 500 mg by mouth every 6 (six) hours as needed for muscle spasms.     . mupirocin ointment (BACTROBAN) 2 % Place 1 application into the nose 2 (two) times daily. 22 g 0  . nefazodone (SERZONE) 100 MG tablet Take 300 mg by mouth at bedtime.     Marland Kitchen OLANZapine-FLUoxetine (SYMBYAX) 3-25 MG capsule Take 1 capsule by mouth at bedtime.  0  . omeprazole (PRILOSEC) 40 MG capsule Take 40 mg by mouth every morning.  0  . Pancrelipase, Lip-Prot-Amyl, (ZENPEP) 15000 UNITS CPEP Take 45,000 Units by mouth 3 (three) times daily after meals.    . protriptyline (VIVACTIL) 10 MG tablet Take 10 mg by mouth at bedtime.    . SUMAtriptan (IMITREX) 100 MG tablet Take 100 mg by mouth every 2 (two) hours as needed for migraine or headache. May repeat in 2 hours if headache persists or recurs.    . topiramate (TOPAMAX) 100 MG tablet Take 1 tablet (100 mg total) by mouth 2 (  two) times daily. (Patient taking differently: Take 100-200 mg by mouth. Take 1 tablet in the morning and Take 2 tablets at bedtime) 60 tablet 1  . traMADol (ULTRAM) 50 MG tablet Take 1-2 tablets (50-100 mg total) by mouth every 6 (six) hours as needed for moderate pain. 30 tablet 0   No current facility-administered medications for this visit.      Allergies:  Allergies  Allergen Reactions  . Sulfonamide Derivatives Swelling    Past Medical History, Surgical history, Social history, and Family History were reviewed and updated.  Physical Exam: Blood pressure (!) 109/48, pulse  74, temperature 98 F (36.7 C), temperature source Oral, resp. rate 20, height 5\' 1"  (1.549 m), weight 119 lb 14.4 oz (54.4 kg), SpO2 100 %. ECOG:  1 General appearance: alert and cooperative Head: Normocephalic, without obvious abnormality Neck: no adenopathy and no carotid bruit Lymph nodes: Cervical, supraclavicular, and axillary nodes normal. Heart:regular rate and rhythm, S1, S2 normal, no murmur, click, rub or gallop Lung:chest clear, no wheezing, rales, normal symmetric air entry Abdomin: Soft, slightly tender on palpation without rebound or guarding. EXT:no erythema, induration, or nodules   Lab Results: Lab Results  Component Value Date   WBC 6.2 09/17/2016   HGB 13.9 09/17/2016   HCT 42.2 09/17/2016   MCV 97.5 09/17/2016   PLT 264 09/17/2016     Chemistry      Component Value Date/Time   NA 140 07/20/2016 1547   NA 136 05/20/2016 1119   K 4.1 07/20/2016 1547   K 3.5 05/20/2016 1119   CL 111 07/20/2016 1547   CO2 20 07/20/2016 1547   CO2 13 (L) 05/20/2016 1119   BUN 16 07/20/2016 1547   BUN 19.9 05/20/2016 1119   CREATININE 1.20 07/20/2016 1547   CREATININE 1.5 (H) 05/20/2016 1119      Component Value Date/Time   CALCIUM 9.4 07/20/2016 1547   CALCIUM 9.8 05/20/2016 1119   ALKPHOS 69 05/20/2016 1119   AST 20 05/20/2016 1119   ALT 37 05/20/2016 1119   BILITOT 0.38 05/20/2016 1119       Impression and Plan:  75 year old woman with the following issues:  1. 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 Her hemoglobin on 04/27/2016 was up to 17. This was elevated as high as 19 in 2016. Her CBC was repeated today and was within normal range.  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 Given the fact that her hemoglobin is within normal range I have recommended no further evaluation or workup at this time.  2. Abdominal pain: Unclear etiology and her workup is  ongoing by her primary care provider. I do not think this is a sign of malignancy given the fact that she had a scan done 5 months ago which was within normal range.  3. Follow-up: No further hematology follow-up is needed. I am happy to see her in the future if any other problems arise.  Zola Button, MD 10/20/20171:54 PM

## 2016-09-17 NOTE — Progress Notes (Signed)
HPI:  Here for two acute issues today:  Vaginal discharge: -started after abx in august per her report -she has tried monistat 3 x -white to greenish discharge -denies: fevers, malaise, weight loss, vomiting, dysuria, vag bleeding  Abd pain/diarrhea: -started about 2 months ago, took abx at the time  -pain in lower abd, moderate, constant, unchanging -watery diarrhea several times per day -take mirilax most days chronically as feels she needs this to use the bathroom -denies: fevers, wt loss, malaise, hematochezia, melena, vomiting, wt loss -hx IBS and sees Dr. Earlean Shawl -reports called her gastroenterologist and they could not see her for one month and told her to see her PCP  ROS: See pertinent positives and negatives per HPI.  Past Medical History:  Diagnosis Date  . Arthritis   . Benzodiazepine dependence ([place])   . Carotid arterial disease (HCC)    mild  . Chronic pain    managed by pain clinic  . Colon abnormality    didn't work right so part of it was removed at [facility]  . Depression    managed by Dr. Toy Care  . Diastolic heart failure ([place])    mild on echo 2016  . GERD (gastroesophageal reflux disease)   . IBS (irritable bowel syndrome)    sees Dr. Earlean Shawl  . Macular degeneration    goes to [facility] for this  . Migraine    managed by neurologist  . Osteoporosis     Past Surgical History:  Procedure Laterality Date  . ABDOMINAL ADHESION SURGERY    . ABDOMINAL HYSTERECTOMY    . APPENDECTOMY    . CHOLECYSTECTOMY    . COLON SURGERY    . CYSTOSCOPY W/ URETERAL STENT PLACEMENT Right 04/27/2016   Procedure: CYSTOSCOPY WITH RETROGRADE PYELOGRAM/ RIGHT URETERAL STENT PLACEMENT;  Surgeon: Ardis Hughs, MD;  Location: [facility];  Service: Urology;  Laterality: Right;  . OVARIAN CYST SURGERY    . SMALL INTESTINE SURGERY      Family History  Problem Relation Age of Onset  . Kidney disease Mother   . Heart disease Mother   . Cancer Maternal Aunt    intestinal and liver cancer  . Stroke Maternal Grandmother   . Stroke    . Depression      everyone  . Hypercalcemia Neg Hx     Social History   Social History  . Marital status: Married    Spouse name: N/A  . Number of children: N/A  . Years of education: N/A   Social History Main Topics  . Smoking status: Never Smoker  . Smokeless tobacco: Never Used  . Alcohol use No  . Drug use: No  . Sexual activity: No   Other Topics Concern  . None   Social History Narrative   Work or School: none      Home Situation: lives with husband      Spiritual Beliefs:       Lifestyle: no regular exercise; diet is ok              Current Outpatient Prescriptions:  .  acetaminophen (TYLENOL) 500 MG tablet, Take 1,000 mg by mouth every 6 (six) hours as needed for moderate pain., Disp: , Rfl:  .  alendronate (FOSAMAX) 70 MG tablet, take 1 tablet by mouth every week, Disp: 4 tablet, Rfl: 5 .  amphetamine-dextroamphetamine (ADDERALL XR) 20 MG 24 hr capsule, Take 40 mg by mouth as needed. , Disp: , Rfl: 0 .  BIOTIN PO, Take  1 capsule by mouth daily. , Disp: , Rfl:  .  cycloSPORINE (RESTASIS) 0.05 % ophthalmic emulsion, Place 1 drop into both eyes 2 (two) times daily. , Disp: , Rfl:  .  LINZESS 290 MCG CAPS capsule, Take 290 mg by mouth daily before breakfast., Disp: , Rfl: 0 .  metaxalone (SKELAXIN) 800 MG tablet, take 1 tablet by mouth three times a day if needed for pain, Disp: , Rfl: 0 .  methocarbamol (ROBAXIN) 500 MG tablet, Take 500 mg by mouth every 6 (six) hours as needed for muscle spasms. , Disp: , Rfl:  .  mupirocin ointment (BACTROBAN) 2 %, Place 1 application into the nose 2 (two) times daily., Disp: 22 g, Rfl: 0 .  nefazodone (SERZONE) 100 MG tablet, Take 300 mg by mouth at bedtime. , Disp: , Rfl:  .  OLANZapine-FLUoxetine (SYMBYAX) 3-25 MG capsule, Take 1 capsule by mouth at bedtime., Disp: , Rfl: 0 .  omeprazole (PRILOSEC) 40 MG capsule, Take 40 mg by mouth every morning.,  Disp: , Rfl: 0 .  Pancrelipase, Lip-Prot-Amyl, (ZENPEP) 15000 UNITS CPEP, Take 45,000 Units by mouth 3 (three) times daily after meals., Disp: , Rfl:  .  protriptyline (VIVACTIL) 10 MG tablet, Take 10 mg by mouth at bedtime., Disp: , Rfl:  .  SUMAtriptan (IMITREX) 100 MG tablet, Take 100 mg by mouth every 2 (two) hours as needed for migraine or headache. May repeat in 2 hours if headache persists or recurs., Disp: , Rfl:  .  topiramate (TOPAMAX) 100 MG tablet, Take 1 tablet (100 mg total) by mouth 2 (two) times daily. (Patient taking differently: Take 100-200 mg by mouth. Take 1 tablet in the morning and Take 2 tablets at bedtime), Disp: 60 tablet, Rfl: 1 .  traMADol (ULTRAM) 50 MG tablet, Take 1-2 tablets (50-100 mg total) by mouth every 6 (six) hours as needed for moderate pain., Disp: 30 tablet, Rfl: 0  EXAM:  Vitals:   09/17/16 1015  BP: 102/78  Pulse: 91  Temp: 98.4 F (36.9 C)    Body mass index is 22.45 kg/m.  GENERAL: vitals reviewed and listed above, alert, oriented, appears well hydrated and in no acute distress  HEENT: atraumatic, conjunttiva clear, no obvious abnormalities on inspection of external nose and ears  NECK: no obvious masses on inspection  LUNGS: clear to auscultation bilaterally, no wheezes, rales or rhonchi, good air movement  CV: HRRR, no peripheral edema  ABD: BS+, soft, difuse TTP lower abd, no rebound or guarding  GU: normal appearance external genitalia, normal speculum exam except for white homogenous discharge  MS: moves all extremities without noticeable abnormality  PSYCH: pleasant and cooperative, no obvious depression or anxiety  ASSESSMENT AND PLAN:  Discussed the following assessment and plan:  Generalized abdominal pain - Plan: CBC (no diff), CMP, POC Urinalysis Dipstick, Culture, Urine, Urinalysis with Reflex Microscopic Diarrhea, unspecified type - Plan: C. difficile, PCR -may need CT scan/GI eval pending labs - they wish to  transfer to [facility] if GI eval needed -advised holding off on mirilax and dairy -imodium prn -advise ER/return precuations  Vaginal discharge - Plan: Cervicovaginal ancillary only (BV, GC/Chlam, Trich, yeast testing)  Encounter for immunization - Plan: Flu vaccine HIGH DOSE PF  -Patient advised to return or notify a doctor immediately if symptoms worsen or persist or new concerns arise.  Patient Instructions  BEFORE YOU LEAVE: -udip and reflex micro and culture if needed -labs -follow up: 1 month, sooner if worsening  We  have ordered labs or studies at this visit. It can take up to 1-2 weeks for results and processing. IF results require follow up or explanation, we will call you with instructions. Clinically stable results will be released to your [facility]. If you have not heard from [place] or cannot find your results in [facility] in 2 weeks please contact our office at [phone].  If you are not yet signed up for [facility], please consider signing up.  Stop the mirilax. Do not use unless no BM in 3-4 days or hard stools.  No dairy for 1 week.  Probiotic - align or culturelle for 1 month.         Colin Benton R., DO

## 2016-09-17 NOTE — Progress Notes (Signed)
Pre visit review using our clinic review tool, if applicable. No additional management support is needed unless otherwise documented below in the visit note. 

## 2016-09-17 NOTE — Patient Instructions (Signed)
BEFORE YOU LEAVE: -udip and reflex micro and culture if needed -labs -follow up: 1 month, sooner if worsening  We have ordered labs or studies at this visit. It can take up to 1-2 weeks for results and processing. IF results require follow up or explanation, we will call you with instructions. Clinically stable results will be released to your [facility]. If you have not heard from [place] or cannot find your results in [facility] in 2 weeks please contact our office at [phone].  If you are not yet signed up for [facility], please consider signing up.  Stop the mirilax. Do not use unless no BM in 3-4 days or hard stools.  No dairy for 1 week.  Probiotic - align or culturelle for 1 month.

## 2016-09-18 LAB — CLOSTRIDIUM DIFFICILE BY PCR: Toxigenic C. Difficile by PCR: NOT DETECTED

## 2016-09-19 LAB — URINE CULTURE: Organism ID, Bacteria: NO GROWTH

## 2016-09-20 LAB — CERVICOVAGINAL ANCILLARY ONLY
Chlamydia: NEGATIVE
Neisseria Gonorrhea: NEGATIVE
Trichomonas: NEGATIVE

## 2016-09-20 NOTE — Addendum Note (Signed)
Addended by: Agnes Lawrence on: 09/20/2016 05:29 PM   Modules accepted: Orders

## 2016-09-21 DIAGNOSIS — H348332 Tributary (branch) retinal vein occlusion, bilateral, stable: Secondary | ICD-10-CM | POA: Diagnosis not present

## 2016-09-21 DIAGNOSIS — Z9889 Other specified postprocedural states: Secondary | ICD-10-CM | POA: Diagnosis not present

## 2016-09-21 DIAGNOSIS — Z961 Presence of intraocular lens: Secondary | ICD-10-CM | POA: Diagnosis not present

## 2016-09-21 DIAGNOSIS — H3581 Retinal edema: Secondary | ICD-10-CM | POA: Diagnosis not present

## 2016-09-21 DIAGNOSIS — H35342 Macular cyst, hole, or pseudohole, left eye: Secondary | ICD-10-CM | POA: Diagnosis not present

## 2016-09-22 LAB — CERVICOVAGINAL ANCILLARY ONLY
Bacterial vaginitis: NEGATIVE
Candida vaginitis: NEGATIVE

## 2016-09-27 ENCOUNTER — Telehealth: Payer: Self-pay | Admitting: Family Medicine

## 2016-09-27 ENCOUNTER — Telehealth: Payer: Self-pay | Admitting: Oncology

## 2016-09-27 NOTE — Telephone Encounter (Signed)
Patient's husband came in to the office and I discussed the results with him and gave him a copy of the results.  Mr Kunz stated she has an appt with Dr Earlean Shawl in December and he is concerned as the pt still complains of stomach pain and I also advised he call Dr [facility] office daily to see if they have any cancellations to see her sooner.

## 2016-09-27 NOTE — Telephone Encounter (Signed)
Spoke with patient husband re lab/fu and per husband per last discussion w/FS no further appointments needed.   Message to FS.

## 2016-09-27 NOTE — Telephone Encounter (Signed)
Pt husband would like the results

## 2016-09-28 ENCOUNTER — Other Ambulatory Visit: Payer: Self-pay | Admitting: Family Medicine

## 2016-09-28 DIAGNOSIS — Z1231 Encounter for screening mammogram for malignant neoplasm of breast: Secondary | ICD-10-CM

## 2016-09-30 ENCOUNTER — Ambulatory Visit: Payer: 59 | Admitting: Oncology

## 2016-09-30 ENCOUNTER — Other Ambulatory Visit: Payer: 59

## 2016-10-11 ENCOUNTER — Other Ambulatory Visit: Payer: Self-pay | Admitting: Endocrinology

## 2016-10-12 ENCOUNTER — Ambulatory Visit: Payer: 59 | Admitting: Family Medicine

## 2016-10-15 ENCOUNTER — Ambulatory Visit: Payer: 59 | Admitting: Family Medicine

## 2016-11-01 NOTE — Progress Notes (Deleted)
HPI:  ROS: See pertinent positives and negatives per HPI.  Past Medical History:  Diagnosis Date  . Arthritis   . Benzodiazepine dependence ([place])   . Carotid arterial disease (HCC)    mild  . Chronic pain    managed by pain clinic  . Colon abnormality    didn't work right so part of it was removed at [facility]  . Depression    managed by Dr. Toy Care  . Diastolic heart failure ([place])    mild on echo 2016  . GERD (gastroesophageal reflux disease)   . IBS (irritable bowel syndrome)    sees Dr. Earlean Shawl  . Macular degeneration    goes to [facility] for this  . Migraine    managed by neurologist  . Osteoporosis     Past Surgical History:  Procedure Laterality Date  . ABDOMINAL ADHESION SURGERY    . ABDOMINAL HYSTERECTOMY    . APPENDECTOMY    . CHOLECYSTECTOMY    . COLON SURGERY    . CYSTOSCOPY W/ URETERAL STENT PLACEMENT Right 04/27/2016   Procedure: CYSTOSCOPY WITH RETROGRADE PYELOGRAM/ RIGHT URETERAL STENT PLACEMENT;  Surgeon: Ardis Hughs, MD;  Location: [facility];  Service: Urology;  Laterality: Right;  . OVARIAN CYST SURGERY    . SMALL INTESTINE SURGERY      Family History  Problem Relation Age of Onset  . Kidney disease Mother   . Heart disease Mother   . Cancer Maternal Aunt     intestinal and liver cancer  . Stroke Maternal Grandmother   . Stroke    . Depression      everyone  . Hypercalcemia Neg Hx     Social History   Social History  . Marital status: Married    Spouse name: N/A  . Number of children: N/A  . Years of education: N/A   Social History Main Topics  . Smoking status: Never Smoker  . Smokeless tobacco: Never Used  . Alcohol use No  . Drug use: No  . Sexual activity: No   Other Topics Concern  . Not on file   Social History Narrative   Work or School: none      Home Situation: lives with husband      Spiritual Beliefs:       Lifestyle: no regular exercise; diet is ok              Current Outpatient Prescriptions:  .   acetaminophen (TYLENOL) 500 MG tablet, Take 1,000 mg by mouth every 6 (six) hours as needed for moderate pain., Disp: , Rfl:  .  ADDERALL XR 30 MG 24 hr capsule, Take 30 mg by mouth every morning., Disp: , Rfl: 0 .  alendronate (FOSAMAX) 70 MG tablet, take 1 tablet by mouth every week, Disp: 4 tablet, Rfl: 5 .  amphetamine-dextroamphetamine (ADDERALL XR) 20 MG 24 hr capsule, Take 40 mg by mouth as needed. , Disp: , Rfl: 0 .  BIOTIN PO, Take 1 capsule by mouth daily. , Disp: , Rfl:  .  cycloSPORINE (RESTASIS) 0.05 % ophthalmic emulsion, Place 1 drop into both eyes 2 (two) times daily. , Disp: , Rfl:  .  LINZESS 290 MCG CAPS capsule, Take 290 mg by mouth daily before breakfast., Disp: , Rfl: 0 .  metaxalone (SKELAXIN) 800 MG tablet, take 1 tablet by mouth three times a day if needed for pain, Disp: , Rfl: 0 .  methocarbamol (ROBAXIN) 500 MG tablet, Take 500 mg by mouth every 6 (  six) hours as needed for muscle spasms. , Disp: , Rfl:  .  mupirocin ointment (BACTROBAN) 2 %, Place 1 application into the nose 2 (two) times daily., Disp: 22 g, Rfl: 0 .  nefazodone (SERZONE) 100 MG tablet, Take 300 mg by mouth at bedtime. , Disp: , Rfl:  .  OLANZapine-FLUoxetine (SYMBYAX) 3-25 MG capsule, Take 1 capsule by mouth at bedtime., Disp: , Rfl: 0 .  omeprazole (PRILOSEC) 40 MG capsule, Take 40 mg by mouth every morning., Disp: , Rfl: 0 .  Pancrelipase, Lip-Prot-Amyl, (ZENPEP) 15000 UNITS CPEP, Take 45,000 Units by mouth 3 (three) times daily after meals., Disp: , Rfl:  .  protriptyline (VIVACTIL) 10 MG tablet, Take 10 mg by mouth at bedtime., Disp: , Rfl:  .  SUMAtriptan (IMITREX) 100 MG tablet, Take 100 mg by mouth every 2 (two) hours as needed for migraine or headache. May repeat in 2 hours if headache persists or recurs., Disp: , Rfl:  .  topiramate (TOPAMAX) 100 MG tablet, Take 1 tablet (100 mg total) by mouth 2 (two) times daily. (Patient taking differently: Take 100-200 mg by mouth. Take 1 tablet in the  morning and Take 2 tablets at bedtime), Disp: 60 tablet, Rfl: 1 .  traMADol (ULTRAM) 50 MG tablet, Take 1-2 tablets (50-100 mg total) by mouth every 6 (six) hours as needed for moderate pain., Disp: 30 tablet, Rfl: 0  EXAM:  There were no vitals filed for this visit.  There is no height or weight on file to calculate BMI.  GENERAL: vitals reviewed and listed above, alert, oriented, appears well hydrated and in no acute distress  HEENT: atraumatic, conjunttiva clear, no obvious abnormalities on inspection of external nose and ears  NECK: no obvious masses on inspection  LUNGS: clear to auscultation bilaterally, no wheezes, rales or rhonchi, good air movement  CV: HRRR, no peripheral edema  MS: moves all extremities without noticeable abnormality  PSYCH: pleasant and cooperative, no obvious depression or anxiety  ASSESSMENT AND PLAN:  Discussed the following assessment and plan:  No diagnosis found.  -Patient advised to return or notify a doctor immediately if symptoms worsen or persist or new concerns arise.  There are no Patient Instructions on file for this visit.  Colin Benton R., DO

## 2016-11-02 ENCOUNTER — Ambulatory Visit: Payer: 59 | Admitting: Family Medicine

## 2016-11-04 ENCOUNTER — Encounter (HOSPITAL_COMMUNITY): Payer: Self-pay | Admitting: Emergency Medicine

## 2016-11-04 ENCOUNTER — Emergency Department (HOSPITAL_COMMUNITY)
Admission: EM | Admit: 2016-11-04 | Discharge: 2016-11-04 | Disposition: A | Discharge status: Home / Self Care | Payer: 59 | Source: Home / Self Care | Attending: Emergency Medicine | Admitting: Emergency Medicine

## 2016-11-04 ENCOUNTER — Emergency Department (HOSPITAL_COMMUNITY): Payer: 59

## 2016-11-04 DIAGNOSIS — Z79899 Other long term (current) drug therapy: Secondary | ICD-10-CM

## 2016-11-04 DIAGNOSIS — R4182 Altered mental status, unspecified: Secondary | ICD-10-CM | POA: Diagnosis not present

## 2016-11-04 DIAGNOSIS — A419 Sepsis, unspecified organism: Secondary | ICD-10-CM | POA: Diagnosis not present

## 2016-11-04 DIAGNOSIS — Z8673 Personal history of transient ischemic attack (TIA), and cerebral infarction without residual deficits: Secondary | ICD-10-CM | POA: Insufficient documentation

## 2016-11-04 DIAGNOSIS — E876 Hypokalemia: Secondary | ICD-10-CM

## 2016-11-04 DIAGNOSIS — I5030 Unspecified diastolic (congestive) heart failure: Secondary | ICD-10-CM | POA: Insufficient documentation

## 2016-11-04 DIAGNOSIS — R51 Headache: Secondary | ICD-10-CM

## 2016-11-04 DIAGNOSIS — R531 Weakness: Secondary | ICD-10-CM | POA: Insufficient documentation

## 2016-11-04 DIAGNOSIS — R079 Chest pain, unspecified: Secondary | ICD-10-CM

## 2016-11-04 LAB — URINALYSIS, ROUTINE W REFLEX MICROSCOPIC
Bilirubin Urine: NEGATIVE
Glucose, UA: NEGATIVE mg/dL
Ketones, ur: NEGATIVE mg/dL
Leukocytes, UA: NEGATIVE
Nitrite: NEGATIVE
Protein, ur: NEGATIVE mg/dL
Specific Gravity, Urine: 1.006 (ref 1.005–1.030)
pH: 6 (ref 5.0–8.0)

## 2016-11-04 LAB — CBC
HCT: 45.7 % (ref 36.0–46.0)
Hemoglobin: 16.4 g/dL — ABNORMAL HIGH (ref 12.0–15.0)
MCH: 32.5 pg (ref 26.0–34.0)
MCHC: 35.9 g/dL (ref 30.0–36.0)
MCV: 90.7 fL (ref 78.0–100.0)
Platelets: 362 10*3/uL (ref 150–400)
RBC: 5.04 MIL/uL (ref 3.87–5.11)
RDW: 12.7 % (ref 11.5–15.5)
WBC: 14.3 10*3/uL — ABNORMAL HIGH (ref 4.0–10.5)

## 2016-11-04 LAB — BASIC METABOLIC PANEL
Anion gap: 12 (ref 5–15)
BUN: 29 mg/dL — ABNORMAL HIGH (ref 6–20)
CO2: 14 mmol/L — ABNORMAL LOW (ref 22–32)
Calcium: 10 mg/dL (ref 8.9–10.3)
Chloride: 105 mmol/L (ref 101–111)
Creatinine, Ser: 1.27 mg/dL — ABNORMAL HIGH (ref 0.44–1.00)
GFR calc Af Amer: 47 mL/min — ABNORMAL LOW (ref >60)
GFR calc non Af Amer: 41 mL/min — ABNORMAL LOW (ref >60)
Glucose, Bld: 117 mg/dL — ABNORMAL HIGH (ref 65–99)
Potassium: 2.6 mmol/L — CL (ref 3.5–5.1)
Sodium: 131 mmol/L — ABNORMAL LOW (ref 135–145)

## 2016-11-04 LAB — I-STAT TROPONIN, ED: Troponin i, poc: 0 ng/mL (ref 0.00–0.08)

## 2016-11-04 LAB — MAGNESIUM: Magnesium: 2.7 mg/dL — ABNORMAL HIGH (ref 1.7–2.4)

## 2016-11-04 MED ORDER — MAGNESIUM SULFATE 2 GM/50ML IV SOLN
2.0000 g | Freq: Once | INTRAVENOUS | Status: AC
  Administered 2016-11-04: 2 g via INTRAVENOUS
  Filled 2016-11-04: qty 50

## 2016-11-04 MED ORDER — SODIUM CHLORIDE 0.9 % IV SOLN
30.0000 meq | Freq: Once | INTRAVENOUS | Status: AC
  Administered 2016-11-04: 30 meq via INTRAVENOUS
  Filled 2016-11-04: qty 15

## 2016-11-04 MED ORDER — POTASSIUM CHLORIDE CRYS ER 20 MEQ PO TBCR
40.0000 meq | EXTENDED_RELEASE_TABLET | Freq: Once | ORAL | Status: AC
  Administered 2016-11-04: 40 meq via ORAL
  Filled 2016-11-04: qty 2

## 2016-11-04 MED ORDER — POTASSIUM CHLORIDE ER 10 MEQ PO TBCR: 10.0000 meq | EXTENDED_RELEASE_TABLET | Freq: Two times a day (BID) | ORAL | 0 refills | Status: DC

## 2016-11-04 NOTE — ED Provider Notes (Signed)
  Face-to-face evaluation   History: She presents for evaluation of injuries from fall, one week ago, and 4 days ago. States she fell because she was weak, striking her head. She was alone each time she fell so lost consciousness is not known. 4 days ago. She was "wobbly when walking", but now is walking better. She is eating normally and taking her usual medications. She is not having chest pain, focal weakness, paresthesias, nausea or vomiting. She does not take a diuretic medications.  Physical exam: Alert, elderly female. She is lucid and cooperative. Head nontender without ecchymosis or contusion. Neck is supple. Normal range of motion, arms and legs bilaterally.  Medical screening examination/treatment/procedure(s) were conducted as a shared visit with non-physician practitioner(s) and myself.  I personally evaluated the patient during the encounter   Daleen Bo, MD 11/05/16 [phone]

## 2016-11-04 NOTE — ED Triage Notes (Signed)
Pt c/o right side numbness and weakness to face, arm, leg, unsteady gait, confusion, right chest pain, slurred speech, blurred vision even throughout visual fields onset Sunday. No new symptoms today. Pain in back and neck.

## 2016-11-04 NOTE — ED Provider Notes (Signed)
[facility] DEPT Provider Note   CSN: [id number] Arrival date & time: 11/04/16  0946     History   Chief Complaint Chief Complaint  Patient presents with  . Weakness  . Chest Pain    HPI Danielle Gaines is a 75 y.o. female.  Danielle Gaines is a 75 y.o. Female who presents to the ED with her family complaining of feeling off balance and weak for the past 5 days. She reports tingling to the right side of her face, tingling to her right arm and feels unsteady walking. 5 days ago family reports patient seemed confused. She's had a previous stroke and has some chronic slurred speech. Currently patient's husband reports her speech seems to be better than normal. Patient reports some right-sided chest pain ongoing for the past 2 days. She denies any shortness of breath. She reports two days ago she fell and since his had a bad headache. She reports her walking has been better since the fall. She denies any neck or back pain. No recent changes to her medications. Patient denies fevers, shortness of breath, palpitations, double vision, back pain, urinary symptoms or decreased appetite.   The history is provided by the patient. No language interpreter was used.  Weakness  Primary symptoms include no dizziness. Associated symptoms include chest pain and headaches. Pertinent negatives include no shortness of breath and no vomiting.  Chest Pain   Associated symptoms include cough, headaches, numbness and weakness. Pertinent negatives include no abdominal pain, no back pain, no dizziness, no fever, no nausea, no palpitations, no shortness of breath and no vomiting.  Pertinent negatives for past medical history include no seizures.    Past Medical History:  Diagnosis Date  . Arthritis   . Benzodiazepine dependence ([place])   . Carotid arterial disease (HCC)    mild  . Chronic pain    managed by pain clinic  . Colon abnormality    didn't work right so part of it was removed at [facility]  .  Depression    managed by Dr. Toy Care  . Diastolic heart failure ([place])    mild on echo 2016  . GERD (gastroesophageal reflux disease)   . IBS (irritable bowel syndrome)    sees Dr. Earlean Shawl  . Macular degeneration    goes to [facility] for this  . Migraine    managed by neurologist  . Osteoporosis     Patient Active Problem List   Diagnosis Date Noted  . Atypical chest pain 02/10/2016  . Dyspnea 02/10/2016  . Preoperative clearance 02/10/2016  . ECG abnormality 02/10/2016  . Hypercalcemia 09/03/2015  . MDD (major depressive disorder), recurrent severe, without psychosis ([place]) 04/09/2015  . Slurred speech 04/07/2015  . Weakness of left upper extremity 04/07/2015  . Orthostatic hypotension 04/07/2015  . AKI (acute kidney injury) ([place]) 04/07/2015  . Hypomagnesemia 04/07/2015  . Stroke ([place]) 04/07/2015  . Epistaxis 10/23/2014  . Blurry vision, bilateral 10/23/2014  . Osteoporosis 07/24/2014  . Migraine - followed by Dr. Sima Matas in Neurology 05/24/2014  . Anxiety and depression - managed at [facility] 05/24/2014  . GERD (gastroesophageal reflux disease) 05/24/2014  . Macular degeneration - goes to [facility] for this 05/24/2014    Past Surgical History:  Procedure Laterality Date  . ABDOMINAL ADHESION SURGERY    . ABDOMINAL HYSTERECTOMY    . APPENDECTOMY    . CHOLECYSTECTOMY    . COLON SURGERY    . CYSTOSCOPY W/ URETERAL STENT PLACEMENT Right 04/27/2016   Procedure:  CYSTOSCOPY WITH RETROGRADE PYELOGRAM/ RIGHT URETERAL STENT PLACEMENT;  Surgeon: Ardis Hughs, MD;  Location: [facility] ORS;  Service: Urology;  Laterality: Right;  . OVARIAN CYST SURGERY    . SMALL INTESTINE SURGERY      OB History    No data available       Home Medications    Prior to Admission medications   Medication Sig Start Date End Date Taking? Authorizing Provider  acetaminophen (TYLENOL) 500 MG tablet Take 1,000 mg by mouth every 6 (six) hours as needed for moderate pain.   Yes Historical Provider,  MD  ADDERALL XR 30 MG 24 hr capsule Take 30 mg by mouth every morning. 09/08/16  Yes Historical Provider, MD  alendronate (FOSAMAX) 70 MG tablet take 1 tablet by mouth every week Patient taking differently: take 70mg  tablet by mouth every saturday 10/11/16  Yes Renato Shin, MD  BIOTIN PO Take 1 capsule by mouth daily.    Yes Historical Provider, MD  cycloSPORINE (RESTASIS) 0.05 % ophthalmic emulsion Place 1 drop into both eyes 2 (two) times daily.    Yes Historical Provider, MD  hyoscyamine (LEVBID) 0.375 MG 12 hr tablet Take 0.375 mg by mouth 2 (two) times daily.   Yes Historical Provider, MD  LINZESS 290 MCG CAPS capsule Take 290 mg by mouth daily before breakfast. 05/22/15  Yes Historical Provider, MD  metaxalone (SKELAXIN) 800 MG tablet take 800mg  tablet by mouth three times a day if needed for pain 02/25/16  Yes Historical Provider, MD  methocarbamol (ROBAXIN) 500 MG tablet Take 500 mg by mouth every 6 (six) hours as needed for muscle spasms.  04/27/16  Yes Historical Provider, MD  mupirocin ointment (BACTROBAN) 2 % Place 1 application into the nose 2 (two) times daily. Patient taking differently: Place 1 application into the nose 2 (two) times daily as needed (irritations).  07/20/16  Yes Lucretia Kern, DO  nefazodone (SERZONE) 100 MG tablet Take 300 mg by mouth at bedtime.  04/27/16  Yes Historical Provider, MD  OLANZapine-FLUoxetine (SYMBYAX) 3-25 MG capsule Take 1 capsule by mouth at bedtime. 03/04/16  Yes Historical Provider, MD  omeprazole (PRILOSEC) 40 MG capsule Take 40 mg by mouth every morning. 04/15/16  Yes Historical Provider, MD  protriptyline (VIVACTIL) 10 MG tablet Take 10 mg by mouth at bedtime.   Yes Historical Provider, MD  SUMAtriptan (IMITREX) 100 MG tablet Take 100 mg by mouth every 2 (two) hours as needed for migraine or headache. May repeat in 2 hours if headache persists or recurs.   Yes Historical Provider, MD  topiramate (TOPAMAX) 100 MG tablet Take 1 tablet (100 mg total) by  mouth 2 (two) times daily. Patient taking differently: Take 100-200 mg by mouth 2 (two) times daily. Take 100mg  in the morning and Take 200mg  at bedtime 04/11/15  Yes Theodis Blaze, MD  traMADol (ULTRAM) 50 MG tablet Take 1-2 tablets (50-100 mg total) by mouth every 6 (six) hours as needed for moderate pain. 04/27/16  Yes Ardis Hughs, MD  traZODone (DESYREL) 50 MG tablet Take 50 mg by mouth at bedtime.   Yes Historical Provider, MD  potassium chloride (K-DUR) 10 MEQ tablet Take 1 tablet (10 mEq total) by mouth 2 (two) times daily. 11/04/16   Waynetta Pean, PA-C  ZENPEP 15000 units CPEP Take 45,000 mg by mouth 3 (three) times daily as needed for other. Before meals 08/16/16   Historical Provider, MD    Family History Family History  Problem Relation Age  of Onset  . Kidney disease Mother   . Heart disease Mother   . Cancer Maternal Aunt     intestinal and liver cancer  . Stroke Maternal Grandmother   . Stroke    . Depression      everyone  . Hypercalcemia Neg Hx     Social History Social History  Substance Use Topics  . Smoking status: Never Smoker  . Smokeless tobacco: Never Used  . Alcohol use No     Allergies   Sulfonamide derivatives   Review of Systems Review of Systems  Constitutional: Negative for chills and fever.  HENT: Negative for congestion and sore throat.   Eyes: Negative for pain and visual disturbance.  Respiratory: Positive for cough. Negative for shortness of breath.   Cardiovascular: Positive for chest pain. Negative for palpitations.  Gastrointestinal: Negative for abdominal pain, nausea and vomiting.  Genitourinary: Negative for difficulty urinating, dysuria, frequency and urgency.  Musculoskeletal: Positive for arthralgias. Negative for back pain and neck pain.  Skin: Negative for rash.  Neurological: Positive for weakness, numbness and headaches. Negative for dizziness, seizures, syncope and light-headedness.     Physical Exam Updated Vital  Signs BP 138/73 (BP Location: Left Arm)   Pulse 95   Temp 97.4 F (36.3 C) (Oral)   Resp 20   SpO2 99%   Physical Exam  Constitutional: She is oriented to person, place, and time. She appears well-developed and well-nourished. No distress.  Nontoxic appearing.  HENT:  Head: Normocephalic and atraumatic.  Mucous membranes are dry. No visual or palpated signs of head trauma.  Eyes: Conjunctivae and EOM are normal. Pupils are equal, round, and reactive to light. Right eye exhibits no discharge. Left eye exhibits no discharge.  Neck: Normal range of motion. Neck supple. No JVD present. No tracheal deviation present.  No midline neck tenderness.  Cardiovascular: Normal rate, regular rhythm, normal heart sounds and intact distal pulses.  Exam reveals no gallop and no friction rub.   No murmur heard. Bilateral radial, posterior tibialis and dorsalis pedis pulses are intact.    Pulmonary/Chest: Effort normal and breath sounds normal. No stridor. No respiratory distress. She has no wheezes. She has no rales.  Lungs are clear to auscultation bilaterally.  Abdominal: Soft. There is no tenderness.  Musculoskeletal: Normal range of motion. She exhibits no edema or tenderness.  No lower extremity edema or tenderness. Good strength to her bilateral upper and lower extremities.  Lymphadenopathy:    She has no cervical adenopathy.  Neurological: She is alert and oriented to person, place, and time. No cranial nerve deficit. Coordination normal.  Some slight slurred speech. Family reports this is at her baseline. Patient is alert and oriented 3. Cranial nerves are intact. Sensation is intact in her bilateral upper and lower extremities including her face. No pronator drift. Finger to nose intact bilaterally. EOMs are intact. Vision is grossly intact.  Skin: Skin is warm and dry. Capillary refill takes less than 2 seconds. No rash noted. She is not diaphoretic. No erythema. No pallor.  Psychiatric: She  has a normal mood and affect. Her behavior is normal.  Nursing note and vitals reviewed.    ED Treatments / Results  Labs (all labs ordered are listed, but only abnormal results are displayed) Labs Reviewed  BASIC METABOLIC PANEL - Abnormal; Notable for the following:       Result Value   Sodium 131 (*)    Potassium 2.6 (*)    CO2 14 (*)  Glucose, Bld 117 (*)    BUN 29 (*)    Creatinine, Ser 1.27 (*)    GFR calc non Af Amer 41 (*)    GFR calc Af Amer 47 (*)    All other components within normal limits  CBC - Abnormal; Notable for the following:    WBC 14.3 (*)    Hemoglobin 16.4 (*)    All other components within normal limits  MAGNESIUM - Abnormal; Notable for the following:    Magnesium 2.7 (*)    All other components within normal limits  URINALYSIS, ROUTINE W REFLEX MICROSCOPIC - Abnormal; Notable for the following:    Hgb urine dipstick LARGE (*)    Bacteria, UA RARE (*)    Squamous Epithelial / LPF 0-5 (*)    All other components within normal limits  I-STAT TROPOININ, ED    EKG  EKG Interpretation  Date/Time:  Thursday November 04 2016 09:56:16 EST Ventricular Rate:  101 PR Interval:    QRS Duration: 84 QT Interval:  352 QTC Calculation: 457 R Axis:   -7 Text Interpretation:  Ectopic atrial tachycardia, unifocal Anteroseptal infarct, old Abnormal T, consider ischemia, lateral leads Since last tracing lateral ST abnormality is new Confirmed by Eulis Foster  MD, ELLIOTT [phone]) on 11/04/2016 11:23:44 AM       Radiology Dg Chest 2 View  Result Date: 11/04/2016 CLINICAL DATA:  Mid chest pain radiating into the back over the past several days. Status post fall a few days ago. EXAM: CHEST  2 VIEW COMPARISON:  PA and lateral chest 06/13/2015. FINDINGS: Small focus of linear scar is seen in the anterior right upper lobe. Lungs are otherwise clear. Heart size is normal. No pneumothorax or pleural effusion. Heart size is normal. No acute bony abnormality. IMPRESSION: No  acute disease. Electronically Signed   By: Inge Rise M.D.   On: 11/04/2016 10:30   Ct Head Wo Contrast  Result Date: 11/04/2016 CLINICAL DATA:  Fall 2 days ago.  Headache. EXAM: CT HEAD WITHOUT CONTRAST TECHNIQUE: Contiguous axial images were obtained from the base of the skull through the vertex without intravenous contrast. COMPARISON:  04/07/2015 FINDINGS: Brain: No acute intracranial abnormality. Specifically, no hemorrhage, hydrocephalus, mass lesion, acute infarction, or significant intracranial injury. Vascular: No hyperdense vessel or unexpected calcification. Skull: No acute calvarial abnormality Sinuses/Orbits: Visualized paranasal sinuses and mastoids clear. Orbital soft tissues unremarkable. Other: None IMPRESSION: Normal study. Electronically Signed   By: Rolm Baptise M.D.   On: 11/04/2016 13:17    Procedures Procedures (including critical care time)  Medications Ordered in ED Medications  potassium chloride 30 mEq in sodium chloride 0.9 % 265 mL (KCL MULTIRUN) IVPB (30 mEq Intravenous Given 11/04/16 1349)  magnesium sulfate IVPB 2 g 50 mL (0 g Intravenous Stopped 11/04/16 1352)  potassium chloride SA (K-DUR,KLOR-CON) CR tablet 40 mEq (40 mEq Oral Given 11/04/16 1217)     Initial Impression / Assessment and Plan / ED Course  I have reviewed the triage vital signs and the nursing notes.  Pertinent labs & imaging results that were available during my care of the patient were reviewed by me and considered in my medical decision making (see chart for details).  Clinical Course    This is a 75 y.o. Female who presents to the ED with her family complaining of feeling off balance and weak for the past 5 days. She reports tingling to the right side of her face, tingling to her right arm and feels unsteady walking.  5 days ago family reports patient seemed confused. She's had a previous stroke and has some chronic slurred speech. Currently patient's husband reports her speech seems to  be better than normal. Patient reports some right-sided chest pain ongoing for the past 2 days. She denies any shortness of breath. She reports two days ago she fell and since his had a bad headache. She reports her walking has been better since the fall. She denies any neck or back pain.  Later, the patient admits that she's been having this tingling and weakness ongoing for probably 4 months or more. Troponin is not elevated. Patient reports she's had this nonspecific pain for 2 days or more. I do not see the need for delta troponin at this time. CBC is remarkable only for white count of 14,000. BMP is mild for a potassium of 2.6. Creatinine is around her baseline. Patient received IV potassium replacement. Urinalysis without signs of infection. Chest x-ray is unremarkable. Head CT is unremarkable. Reevaluation patient family reports she is feeling much better after receiving IV potassium. She looks improved. She is eating a salad. Patient ambulates for me and husband reports that she seems to be walking better than usual. My attending, Dr. Eulis Foster also evaluated the patient. He feels the patient is safe to be discharged after potassium. Will discharge home with oral potassium as well. We'll have her closely follow up with her primary care doctor Dr. Maudie Mercury. Patient and family also agree with this plan. I advised the patient to follow-up with their primary care provider this week. I advised the patient to return to the emergency department with new or worsening symptoms or new concerns. The patient verbalized understanding and agreement with plan.    This patient was discussed with and evaluated by Dr. Eulis Foster who agrees with assessment and plan.   Final Clinical Impressions(s) / ED Diagnoses   Final diagnoses:  Hypokalemia  Nonspecific chest pain  Generalized weakness    New Prescriptions New Prescriptions   POTASSIUM CHLORIDE (K-DUR) 10 MEQ TABLET    Take 1 tablet (10 mEq total) by mouth 2 (two) times  daily.     Waynetta Pean, PA-C 11/04/16 1646    Daleen Bo, MD 11/05/16 [phone]

## 2016-11-04 NOTE — ED Notes (Signed)
Patient placed on bed pan, was unable to use restroom. Will call out when she is able to go.

## 2016-11-06 ENCOUNTER — Encounter (HOSPITAL_COMMUNITY): Payer: Self-pay | Admitting: Emergency Medicine

## 2016-11-06 ENCOUNTER — Inpatient Hospital Stay (HOSPITAL_COMMUNITY)
Admission: EM | Admit: 2016-11-06 | Discharge: 2016-11-10 | DRG: 871 | Disposition: A | Discharge status: Home / Self Care | Payer: 59 | Attending: Family Medicine | Admitting: Family Medicine

## 2016-11-06 ENCOUNTER — Emergency Department (HOSPITAL_COMMUNITY): Payer: 59

## 2016-11-06 DIAGNOSIS — I5032 Chronic diastolic (congestive) heart failure: Secondary | ICD-10-CM | POA: Diagnosis present

## 2016-11-06 DIAGNOSIS — E872 Acidosis: Secondary | ICD-10-CM | POA: Diagnosis present

## 2016-11-06 DIAGNOSIS — K581 Irritable bowel syndrome with constipation: Secondary | ICD-10-CM | POA: Diagnosis present

## 2016-11-06 DIAGNOSIS — E440 Moderate protein-calorie malnutrition: Secondary | ICD-10-CM | POA: Diagnosis present

## 2016-11-06 DIAGNOSIS — F329 Major depressive disorder, single episode, unspecified: Secondary | ICD-10-CM | POA: Diagnosis present

## 2016-11-06 DIAGNOSIS — B961 Klebsiella pneumoniae [K. pneumoniae] as the cause of diseases classified elsewhere: Secondary | ICD-10-CM | POA: Diagnosis present

## 2016-11-06 DIAGNOSIS — H3530 Unspecified macular degeneration: Secondary | ICD-10-CM | POA: Diagnosis present

## 2016-11-06 DIAGNOSIS — M810 Age-related osteoporosis without current pathological fracture: Secondary | ICD-10-CM | POA: Diagnosis present

## 2016-11-06 DIAGNOSIS — Z8673 Personal history of transient ischemic attack (TIA), and cerebral infarction without residual deficits: Secondary | ICD-10-CM | POA: Diagnosis not present

## 2016-11-06 DIAGNOSIS — Z7983 Long term (current) use of bisphosphonates: Secondary | ICD-10-CM | POA: Diagnosis not present

## 2016-11-06 DIAGNOSIS — N179 Acute kidney failure, unspecified: Secondary | ICD-10-CM | POA: Diagnosis not present

## 2016-11-06 DIAGNOSIS — R404 Transient alteration of awareness: Secondary | ICD-10-CM | POA: Diagnosis not present

## 2016-11-06 DIAGNOSIS — Z79899 Other long term (current) drug therapy: Secondary | ICD-10-CM

## 2016-11-06 DIAGNOSIS — E876 Hypokalemia: Secondary | ICD-10-CM | POA: Diagnosis present

## 2016-11-06 DIAGNOSIS — A419 Sepsis, unspecified organism: Secondary | ICD-10-CM | POA: Diagnosis not present

## 2016-11-06 DIAGNOSIS — Z6821 Body mass index (BMI) 21.0-21.9, adult: Secondary | ICD-10-CM | POA: Diagnosis not present

## 2016-11-06 DIAGNOSIS — K219 Gastro-esophageal reflux disease without esophagitis: Secondary | ICD-10-CM | POA: Diagnosis present

## 2016-11-06 DIAGNOSIS — N390 Urinary tract infection, site not specified: Secondary | ICD-10-CM | POA: Diagnosis present

## 2016-11-06 DIAGNOSIS — G9340 Encephalopathy, unspecified: Secondary | ICD-10-CM | POA: Diagnosis not present

## 2016-11-06 DIAGNOSIS — R531 Weakness: Secondary | ICD-10-CM | POA: Diagnosis not present

## 2016-11-06 DIAGNOSIS — R4182 Altered mental status, unspecified: Secondary | ICD-10-CM | POA: Diagnosis present

## 2016-11-06 DIAGNOSIS — R319 Hematuria, unspecified: Secondary | ICD-10-CM | POA: Diagnosis not present

## 2016-11-06 LAB — CBC WITH DIFFERENTIAL/PLATELET
Basophils Absolute: 0 10*3/uL (ref 0.0–0.1)
Basophils Relative: 0 %
Eosinophils Absolute: 0 10*3/uL (ref 0.0–0.7)
Eosinophils Relative: 0 %
HCT: 47.3 % — ABNORMAL HIGH (ref 36.0–46.0)
Hemoglobin: 16.7 g/dL — ABNORMAL HIGH (ref 12.0–15.0)
Lymphocytes Relative: 1 %
Lymphs Abs: 0.2 10*3/uL — ABNORMAL LOW (ref 0.7–4.0)
MCH: 32.7 pg (ref 26.0–34.0)
MCHC: 35.3 g/dL (ref 30.0–36.0)
MCV: 92.6 fL (ref 78.0–100.0)
Monocytes Absolute: 0.4 10*3/uL (ref 0.1–1.0)
Monocytes Relative: 2 %
Neutro Abs: 19.2 10*3/uL — ABNORMAL HIGH (ref 1.7–7.7)
Neutrophils Relative %: 97 %
Platelets: 373 10*3/uL (ref 150–400)
RBC: 5.11 MIL/uL (ref 3.87–5.11)
RDW: 13.1 % (ref 11.5–15.5)
WBC: 19.8 10*3/uL — ABNORMAL HIGH (ref 4.0–10.5)

## 2016-11-06 LAB — COMPREHENSIVE METABOLIC PANEL
ALT: 30 U/L (ref 14–54)
ALT: 28 U/L (ref 14–54)
AST: 30 U/L (ref 15–41)
AST: 29 U/L (ref 15–41)
Albumin: 4.1 g/dL (ref 3.5–5.0)
Albumin: 3.4 g/dL — ABNORMAL LOW (ref 3.5–5.0)
Alkaline Phosphatase: 86 U/L (ref 38–126)
Alkaline Phosphatase: 70 U/L (ref 38–126)
Anion gap: 13 (ref 5–15)
Anion gap: 12 (ref 5–15)
BUN: 38 mg/dL — ABNORMAL HIGH (ref 6–20)
BUN: 36 mg/dL — ABNORMAL HIGH (ref 6–20)
CO2: 9 mmol/L — ABNORMAL LOW (ref 22–32)
CO2: 9 mmol/L — ABNORMAL LOW (ref 22–32)
Calcium: 9.1 mg/dL (ref 8.9–10.3)
Calcium: 7.8 mg/dL — ABNORMAL LOW (ref 8.9–10.3)
Chloride: 116 mmol/L — ABNORMAL HIGH (ref 101–111)
Chloride: 118 mmol/L — ABNORMAL HIGH (ref 101–111)
Creatinine, Ser: 1.67 mg/dL — ABNORMAL HIGH (ref 0.44–1.00)
Creatinine, Ser: 1.41 mg/dL — ABNORMAL HIGH (ref 0.44–1.00)
GFR calc Af Amer: 34 mL/min — ABNORMAL LOW (ref >60)
GFR calc Af Amer: 42 mL/min — ABNORMAL LOW (ref >60)
GFR calc non Af Amer: 29 mL/min — ABNORMAL LOW (ref >60)
GFR calc non Af Amer: 36 mL/min — ABNORMAL LOW (ref >60)
Glucose, Bld: 159 mg/dL — ABNORMAL HIGH (ref 65–99)
Glucose, Bld: 136 mg/dL — ABNORMAL HIGH (ref 65–99)
Potassium: 3.5 mmol/L (ref 3.5–5.1)
Potassium: 2.5 mmol/L — CL (ref 3.5–5.1)
Sodium: 138 mmol/L (ref 135–145)
Sodium: 139 mmol/L (ref 135–145)
Total Bilirubin: 0.9 mg/dL (ref 0.3–1.2)
Total Bilirubin: 0.5 mg/dL (ref 0.3–1.2)
Total Protein: 8.1 g/dL (ref 6.5–8.1)
Total Protein: 7.2 g/dL (ref 6.5–8.1)

## 2016-11-06 LAB — URINALYSIS, ROUTINE W REFLEX MICROSCOPIC
Bilirubin Urine: NEGATIVE
Glucose, UA: NEGATIVE mg/dL
Ketones, ur: NEGATIVE mg/dL
Nitrite: NEGATIVE
Protein, ur: 100 mg/dL — AB
Specific Gravity, Urine: 1.011 (ref 1.005–1.030)
pH: 5 (ref 5.0–8.0)

## 2016-11-06 LAB — INFLUENZA PANEL BY PCR (TYPE A & B)
Influenza A By PCR: NEGATIVE
Influenza B By PCR: NEGATIVE

## 2016-11-06 LAB — LACTIC ACID, PLASMA
Lactic Acid, Venous: 1.8 mmol/L (ref 0.5–1.9)
Lactic Acid, Venous: 2.1 mmol/L (ref 0.5–1.9)

## 2016-11-06 LAB — I-STAT CG4 LACTIC ACID, ED: Lactic Acid, Venous: 2.02 mmol/L (ref 0.5–1.9)

## 2016-11-06 LAB — MRSA PCR SCREENING: MRSA by PCR: NEGATIVE

## 2016-11-06 LAB — PROTIME-INR
INR: 1.06
Prothrombin Time: 13.8 seconds (ref 11.4–15.2)

## 2016-11-06 LAB — MAGNESIUM: Magnesium: 2.3 mg/dL (ref 1.7–2.4)

## 2016-11-06 LAB — PROCALCITONIN: Procalcitonin: 2.72 ng/mL

## 2016-11-06 LAB — POTASSIUM: Potassium: 2.9 mmol/L — ABNORMAL LOW (ref 3.5–5.1)

## 2016-11-06 MED ORDER — ONDANSETRON HCL 4 MG/2ML IJ SOLN
4.0000 mg | Freq: Four times a day (QID) | INTRAMUSCULAR | Status: DC | PRN
  Administered 2016-11-07 (×2): 4 mg via INTRAVENOUS
  Filled 2016-11-06 (×2): qty 2

## 2016-11-06 MED ORDER — CHLORHEXIDINE GLUCONATE 0.12 % MT SOLN
15.0000 mL | Freq: Two times a day (BID) | OROMUCOSAL | Status: DC
  Administered 2016-11-07 – 2016-11-10 (×6): 15 mL via OROMUCOSAL
  Filled 2016-11-06 (×6): qty 15

## 2016-11-06 MED ORDER — ACETAMINOPHEN 650 MG RE SUPP: 650.0000 mg | Freq: Four times a day (QID) | RECTAL | Status: DC | PRN

## 2016-11-06 MED ORDER — SODIUM CHLORIDE 0.9 % IV BOLUS (SEPSIS)
250.0000 mL | Freq: Once | INTRAVENOUS | Status: AC
  Administered 2016-11-06: 250 mL via INTRAVENOUS

## 2016-11-06 MED ORDER — DEXTROSE 5 % IV SOLN
2.0000 g | Freq: Once | INTRAVENOUS | Status: DC
  Filled 2016-11-06: qty 2

## 2016-11-06 MED ORDER — DEXTROSE 5 % IV SOLN
2.0000 g | INTRAVENOUS | Status: AC
  Administered 2016-11-06: 2 g via INTRAVENOUS

## 2016-11-06 MED ORDER — ACETAMINOPHEN 325 MG PO TABS
650.0000 mg | ORAL_TABLET | Freq: Once | ORAL | Status: AC
  Administered 2016-11-06: 650 mg via ORAL
  Filled 2016-11-06: qty 2

## 2016-11-06 MED ORDER — DEXTROSE 5 % IV SOLN: 1.0000 g | INTRAVENOUS | Status: DC

## 2016-11-06 MED ORDER — SODIUM CHLORIDE 0.9 % IV SOLN
INTRAVENOUS | Status: DC
  Administered 2016-11-06: 16:00:00 via INTRAVENOUS

## 2016-11-06 MED ORDER — ORAL CARE MOUTH RINSE
15.0000 mL | Freq: Two times a day (BID) | OROMUCOSAL | Status: DC
  Administered 2016-11-07 – 2016-11-10 (×5): 15 mL via OROMUCOSAL

## 2016-11-06 MED ORDER — SODIUM CHLORIDE 0.9 % IV BOLUS (SEPSIS)
500.0000 mL | Freq: Once | INTRAVENOUS | Status: AC
  Administered 2016-11-06: 500 mL via INTRAVENOUS

## 2016-11-06 MED ORDER — CYCLOSPORINE 0.05 % OP EMUL
1.0000 [drp] | Freq: Two times a day (BID) | OPHTHALMIC | Status: DC
  Administered 2016-11-06 – 2016-11-09 (×7): 1 [drp] via OPHTHALMIC
  Filled 2016-11-06 (×10): qty 1

## 2016-11-06 MED ORDER — CEFTRIAXONE SODIUM 1 G IJ SOLR
1.0000 g | INTRAMUSCULAR | Status: DC
  Administered 2016-11-07 – 2016-11-08 (×2): 1 g via INTRAVENOUS
  Filled 2016-11-06 (×3): qty 10

## 2016-11-06 MED ORDER — HEPARIN SODIUM (PORCINE) 5000 UNIT/ML IJ SOLN
5000.0000 [IU] | Freq: Three times a day (TID) | INTRAMUSCULAR | Status: DC
  Administered 2016-11-06 – 2016-11-10 (×12): 5000 [IU] via SUBCUTANEOUS
  Filled 2016-11-06 (×13): qty 1

## 2016-11-06 MED ORDER — POTASSIUM CHLORIDE IN NACL 20-0.9 MEQ/L-% IV SOLN
INTRAVENOUS | Status: DC
Start: 2016-11-06 — End: 2016-11-07
  Administered 2016-11-06 – 2016-11-07 (×3): via INTRAVENOUS
  Filled 2016-11-06 (×2): qty 1000

## 2016-11-06 MED ORDER — PANTOPRAZOLE SODIUM 40 MG PO TBEC
40.0000 mg | DELAYED_RELEASE_TABLET | Freq: Every day | ORAL | Status: DC
  Administered 2016-11-07 – 2016-11-10 (×4): 40 mg via ORAL
  Filled 2016-11-06 (×4): qty 1

## 2016-11-06 MED ORDER — SODIUM CHLORIDE 0.9 % IV SOLN: INTRAVENOUS | Status: DC

## 2016-11-06 MED ORDER — TOPIRAMATE 100 MG PO TABS
200.0000 mg | ORAL_TABLET | Freq: Every day | ORAL | Status: DC
  Administered 2016-11-06 – 2016-11-09 (×4): 200 mg via ORAL
  Filled 2016-11-06 (×4): qty 2

## 2016-11-06 MED ORDER — ACETAMINOPHEN 325 MG PO TABS
650.0000 mg | ORAL_TABLET | Freq: Four times a day (QID) | ORAL | Status: DC | PRN
  Administered 2016-11-07 – 2016-11-09 (×2): 650 mg via ORAL
  Filled 2016-11-06 (×2): qty 2

## 2016-11-06 MED ORDER — ONDANSETRON HCL 4 MG PO TABS: 4.0000 mg | ORAL_TABLET | Freq: Four times a day (QID) | ORAL | Status: DC | PRN

## 2016-11-06 MED ORDER — SODIUM CHLORIDE 0.9 % IV BOLUS (SEPSIS)
1000.0000 mL | Freq: Once | INTRAVENOUS | Status: AC
Start: 2016-11-06 — End: 2016-11-06
  Administered 2016-11-06: 1000 mL via INTRAVENOUS

## 2016-11-06 MED ORDER — DEXTROSE 5 % IV SOLN: 2.0000 g | Freq: Once | INTRAVENOUS | Status: DC

## 2016-11-06 MED ORDER — SODIUM CHLORIDE 0.9 % IV SOLN
30.0000 meq | Freq: Once | INTRAVENOUS | Status: AC
  Administered 2016-11-06: 30 meq via INTRAVENOUS
  Filled 2016-11-06: qty 15

## 2016-11-06 NOTE — Progress Notes (Signed)
Pharmacy Antibiotic Note  Danielle Gaines is a 75 y.o. female admitted on 11/06/2016 with suspected sepsis, UTI.  Pharmacy has been consulted for Ceftriaxone dosing.  Plan:  Ceftriaxone 2g IV stat, then 1g IV q24h  Dosage remains stable and need for further dosage adjustment appears unlikely at present.  Pharmacy will sign off at this time.  Please reconsult if a change in clinical status warrants re-evaluation of dosage.   Height: 5\' 1"  (154.9 cm) Weight: 115 lb (52.2 kg) IBW/kg (Calculated) : 47.8  Temp (24hrs), Avg:101.8 F (38.8 C), Min:101.8 F (38.8 C), Max:101.8 F (38.8 C)   Recent Labs Lab 11/04/16 1035 11/06/16 1251 11/06/16 1257  WBC 14.3* 19.8*  --   CREATININE 1.27*  --   --   LATICACIDVEN  --   --  2.02*    Estimated Creatinine Clearance: 29.8 mL/min (by C-G formula based on SCr of 1.27 mg/dL (H)).    Allergies  Allergen Reactions  . Sulfonamide Derivatives Swelling    Antimicrobials this admission: 12/9 Ceftriaxone >>   Dose adjustments this admission:   Microbiology results: 12/9 BCx: sent 12/9 UCx: sent   Thank you for allowing pharmacy to be a part of this patient's care.  Gretta Arab PharmD, BCPS Pager [phone] 11/06/2016 1:19 PM

## 2016-11-06 NOTE — ED Provider Notes (Signed)
[place] DEPT Provider Note   CSN: [id number] Arrival date & time: 11/06/16  1207     History   Chief Complaint Chief Complaint  Patient presents with  . Altered Mental Status    HPI Danielle Gaines is a 75 y.o. female.  75 year old female presents with altered mental status as well as weakness and tachycardia. Seen in the ED 3 days ago and treated for hypokalemia. Since that time she has had increasing cough as well as being febrile currently. She also has some emesis today and has chronically watery stools. During her prior evaluation she did have a head CT which was negative. No reported urinary symptoms. Symptoms have been progressively worse over the past 24 hours and no treatment use prior to arrival      Past Medical History:  Diagnosis Date  . Arthritis   . Benzodiazepine dependence ([place])   . Carotid arterial disease (HCC)    mild  . Chronic pain    managed by pain clinic  . Colon abnormality    didn't work right so part of it was removed at [facility]  . Depression    managed by Dr. Toy Care  . Diastolic heart failure ([place])    mild on echo 2016  . GERD (gastroesophageal reflux disease)   . IBS (irritable bowel syndrome)    sees Dr. Earlean Shawl  . Macular degeneration    goes to [facility] for this  . Migraine    managed by neurologist  . Osteoporosis     Patient Active Problem List   Diagnosis Date Noted  . Atypical chest pain 02/10/2016  . Dyspnea 02/10/2016  . Preoperative clearance 02/10/2016  . ECG abnormality 02/10/2016  . Hypercalcemia 09/03/2015  . MDD (major depressive disorder), recurrent severe, without psychosis ([place]) 04/09/2015  . Slurred speech 04/07/2015  . Weakness of left upper extremity 04/07/2015  . Orthostatic hypotension 04/07/2015  . AKI (acute kidney injury) ([place]) 04/07/2015  . Hypomagnesemia 04/07/2015  . Stroke ([place]) 04/07/2015  . Epistaxis 10/23/2014  . Blurry vision, bilateral 10/23/2014  . Osteoporosis 07/24/2014  .  Migraine - followed by Dr. Sima Matas in Neurology 05/24/2014  . Anxiety and depression - managed at [facility] 05/24/2014  . GERD (gastroesophageal reflux disease) 05/24/2014  . Macular degeneration - goes to [facility] for this 05/24/2014    Past Surgical History:  Procedure Laterality Date  . ABDOMINAL ADHESION SURGERY    . ABDOMINAL HYSTERECTOMY    . APPENDECTOMY    . CHOLECYSTECTOMY    . COLON SURGERY    . CYSTOSCOPY W/ URETERAL STENT PLACEMENT Right 04/27/2016   Procedure: CYSTOSCOPY WITH RETROGRADE PYELOGRAM/ RIGHT URETERAL STENT PLACEMENT;  Surgeon: Ardis Hughs, MD;  Location: [facility];  Service: Urology;  Laterality: Right;  . OVARIAN CYST SURGERY    . SMALL INTESTINE SURGERY      OB History    No data available       Home Medications    Prior to Admission medications   Medication Sig Start Date End Date Taking? Authorizing Provider  acetaminophen (TYLENOL) 500 MG tablet Take 1,000 mg by mouth every 6 (six) hours as needed for moderate pain.    Historical Provider, MD  ADDERALL XR 30 MG 24 hr capsule Take 30 mg by mouth every morning. 09/08/16   Historical Provider, MD  alendronate (FOSAMAX) 70 MG tablet take 1 tablet by mouth every week Patient taking differently: take 70mg  tablet by mouth every saturday 10/11/16   Renato Shin, MD  BIOTIN PO Take 1 capsule by mouth daily.     Historical Provider, MD  cycloSPORINE (RESTASIS) 0.05 % ophthalmic emulsion Place 1 drop into both eyes 2 (two) times daily.     Historical Provider, MD  hyoscyamine (LEVBID) 0.375 MG 12 hr tablet Take 0.375 mg by mouth 2 (two) times daily.    Historical Provider, MD  LINZESS 290 MCG CAPS capsule Take 290 mg by mouth daily before breakfast. 05/22/15   Historical Provider, MD  metaxalone (SKELAXIN) 800 MG tablet take 800mg  tablet by mouth three times a day if needed for pain 02/25/16   Historical Provider, MD  methocarbamol (ROBAXIN) 500 MG tablet Take 500 mg by mouth every 6 (six) hours as needed  for muscle spasms.  04/27/16   Historical Provider, MD  mupirocin ointment (BACTROBAN) 2 % Place 1 application into the nose 2 (two) times daily. Patient taking differently: Place 1 application into the nose 2 (two) times daily as needed (irritations).  07/20/16   Lucretia Kern, DO  nefazodone (SERZONE) 100 MG tablet Take 300 mg by mouth at bedtime.  04/27/16   Historical Provider, MD  OLANZapine-FLUoxetine (SYMBYAX) 3-25 MG capsule Take 1 capsule by mouth at bedtime. 03/04/16   Historical Provider, MD  omeprazole (PRILOSEC) 40 MG capsule Take 40 mg by mouth every morning. 04/15/16   Historical Provider, MD  potassium chloride (K-DUR) 10 MEQ tablet Take 1 tablet (10 mEq total) by mouth 2 (two) times daily. 11/04/16   Waynetta Pean, PA-C  protriptyline (VIVACTIL) 10 MG tablet Take 10 mg by mouth at bedtime.    Historical Provider, MD  SUMAtriptan (IMITREX) 100 MG tablet Take 100 mg by mouth every 2 (two) hours as needed for migraine or headache. May repeat in 2 hours if headache persists or recurs.    Historical Provider, MD  topiramate (TOPAMAX) 100 MG tablet Take 1 tablet (100 mg total) by mouth 2 (two) times daily. Patient taking differently: Take 100-200 mg by mouth 2 (two) times daily. Take 100mg  in the morning and Take 200mg  at bedtime 04/11/15   Theodis Blaze, MD  traMADol (ULTRAM) 50 MG tablet Take 1-2 tablets (50-100 mg total) by mouth every 6 (six) hours as needed for moderate pain. 04/27/16   Ardis Hughs, MD  traZODone (DESYREL) 50 MG tablet Take 50 mg by mouth at bedtime.    Historical Provider, MD  ZENPEP 15000 units CPEP Take 45,000 mg by mouth 3 (three) times daily as needed for other. Before meals 08/16/16   Historical Provider, MD    Family History Family History  Problem Relation Age of Onset  . Kidney disease Mother   . Heart disease Mother   . Cancer Maternal Aunt     intestinal and liver cancer  . Stroke Maternal Grandmother   . Stroke    . Depression      everyone  .  Hypercalcemia Neg Hx     Social History Social History  Substance Use Topics  . Smoking status: Never Smoker  . Smokeless tobacco: Never Used  . Alcohol use No     Allergies   Sulfonamide derivatives   Review of Systems Review of Systems  All other systems reviewed and are negative.    Physical Exam Updated Vital Signs BP 130/70 (BP Location: Right Arm)   Pulse (!) 122   Temp 101.8 F (38.8 C) (Rectal)   Resp 22   Ht 5\' 1"  (1.549 m)   Wt 52.2 kg   SpO2  97%   BMI 21.73 kg/m   Physical Exam  Constitutional: She appears well-developed and well-nourished. She appears lethargic.  Non-toxic appearance. No distress.  HENT:  Head: Normocephalic and atraumatic.  Eyes: Conjunctivae, EOM and lids are normal. Pupils are equal, round, and reactive to light.  Neck: Normal range of motion. Neck supple. No tracheal deviation present. No thyroid mass present.  Cardiovascular: Regular rhythm and normal heart sounds.  Tachycardia present.  Exam reveals no gallop.   No murmur heard. Pulmonary/Chest: Effort normal and breath sounds normal. No stridor. No respiratory distress. She has no decreased breath sounds. She has no wheezes. She has no rhonchi. She has no rales.  Abdominal: Soft. Normal appearance and bowel sounds are normal. She exhibits no distension. There is no tenderness. There is no rebound and no CVA tenderness.  Musculoskeletal: Normal range of motion. She exhibits no edema or tenderness.  Neurological: She appears lethargic. She is disoriented. She displays atrophy. She displays no tremor. No cranial nerve deficit or sensory deficit. GCS eye subscore is 4. GCS verbal subscore is 5. GCS motor subscore is 6.  Skin: Skin is warm and dry. No abrasion and no rash noted.  Psychiatric: Her affect is blunt. Her speech is delayed.  Nursing note and vitals reviewed.    ED Treatments / Results  Labs (all labs ordered are listed, but only abnormal results are displayed) Labs  Reviewed  URINALYSIS, ROUTINE W REFLEX MICROSCOPIC - Abnormal; Notable for the following:       Result Value   APPearance CLOUDY (*)    Hgb urine dipstick MODERATE (*)    Protein, ur 100 (*)    Leukocytes, UA LARGE (*)    All other components within normal limits  I-STAT CG4 LACTIC ACID, ED - Abnormal; Notable for the following:    Lactic Acid, Venous 2.02 (*)    All other components within normal limits  CULTURE, BLOOD (ROUTINE X 2)  CULTURE, BLOOD (ROUTINE X 2)  URINE CULTURE  CULTURE, BLOOD (ROUTINE X 2)  CULTURE, BLOOD (ROUTINE X 2)  URINE CULTURE  COMPREHENSIVE METABOLIC PANEL  CBC WITH DIFFERENTIAL/PLATELET  PROTIME-INR  CBC WITH DIFFERENTIAL/PLATELET  URINALYSIS, ROUTINE W REFLEX MICROSCOPIC  I-STAT BETA HCG BLOOD, ED ([facility], [facility], [facility] ONLY)  I-STAT CG4 LACTIC ACID, ED    EKG  EKG Interpretation None       Radiology No results found.  Procedures Procedures (including critical care time)  Medications Ordered in ED Medications  sodium chloride 0.9 % bolus 1,000 mL (not administered)    And  sodium chloride 0.9 % bolus 500 mL (not administered)    And  sodium chloride 0.9 % bolus 250 mL (not administered)  acetaminophen (TYLENOL) tablet 650 mg (not administered)     Initial Impression / Assessment and Plan / ED Course  I have reviewed the triage vital signs and the nursing notes.  Pertinent labs & imaging results that were available during my care of the patient were reviewed by me and considered in my medical decision making (see chart for details).  Clinical Course     Patient started on IV fluids and antibiotics for suspected urosepsis. Chest x-rays negative. Patient appears profoundly dehydrated as her CO2 is 9 and her creatinine is elevated. Will aggressively hydrate. Patient's initial lactate noted. Repeat is pending. She has a moderate leukocytosis noted. Fever treated with Tylenol. Discussed with hospitalist and will be admitted to  stepdown   CRITICAL CARE Performed by: Leota Jacobsen Total critical care  time: 50 minutes Critical care time was exclusive of separately billable procedures and treating other patients. Critical care was necessary to treat or prevent imminent or life-threatening deterioration. Critical care was time spent personally by me on the following activities: development of treatment plan with patient and/or surrogate as well as nursing, discussions with consultants, evaluation of patient's response to treatment, examination of patient, obtaining history from patient or surrogate, ordering and performing treatments and interventions, ordering and review of laboratory studies, ordering and review of radiographic studies, pulse oximetry and re-evaluation of patient's condition.  Final Clinical Impressions(s) / ED Diagnoses   Final diagnoses:  None    New Prescriptions New Prescriptions   No medications on file     Lacretia Leigh, MD 11/06/16 1423

## 2016-11-06 NOTE — ED Triage Notes (Signed)
Per EMS, family member called EMS d/t patient havent changes in mental status and generalized weakness.  Pt was seen in ER Thursday, had a low K+ level, received IV K+ and was sent home on PO supplements.  Pt presents with AMS & tachycardia.

## 2016-11-06 NOTE — ED Notes (Signed)
Bed: WA06 Expected date: 11/06/16 Expected time: 12:04 PM Means of arrival: Ambulance Comments: 75 yo tachy 120 weakness, "being out of it mentally"

## 2016-11-06 NOTE — H&P (Signed)
History and Physical        Hospital Admission Note Date: 11/06/2016  Patient name: Danielle Gaines record number: [id number] Date of birth: 17-May-1941 Age: 75 y.o. Gender: female  PCP: Lucretia Kern., DO   Referring physician: *Dr Zenia Resides   Patient coming from: home    Chief Complaint:  Confused   HPI: Patient is a 75 year old female with history of arthritis, chronic diastolic CHF, GERD, IBS presented to ED with altered mental status, generalized weakness. History was obtained from patient's husband at the bedside. She was seen in ED on 12/7, 3 days ago and was treated for hypokalemia. Per patient's husband, for last 1 week she has been feeling generalized weakness, has been unsteady on her feet however in the last 3 days she has been progressively getting worse. 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 This morning, patient was significantly worse, confused, febrile. Patient is unable to provide any history herself. Per husband, she had 2 episodes of vomiting, was complaining of lower abdominal pain. She had no coughing or URI symptoms. She had been complaining of myalgias. She had been significantly unsteady and weak that he had been helping her to walk in the house.  ED workup  temperature and 1.8, heart rate 122, BP 130/70, O2 97% on room air  Sodium 138, potassium 3.5, BUN 38, creatinine 1.6. Creatinine on 12/7 was 1.2 WBC is 19.8. WBC is 14.3 on 12/7. Lactic acid 2.0 CT head normal study  Review of Systems: Positives marked in 'bold' Unable to obtain from patient due to her mental status  Past Medical History: Past Medical History:  Diagnosis Date  . Arthritis   . Benzodiazepine dependence ([place])   . Carotid arterial disease (HCC)    mild  . Chronic pain    managed by pain clinic  . Colon abnormality    didn't work right so  part of it was removed at [facility]  . Depression    managed by Dr. Toy Care  . Diastolic heart failure ([place])    mild on echo 2016  . GERD (gastroesophageal reflux disease)   . IBS (irritable bowel syndrome)    sees Dr. Earlean Shawl  . Macular degeneration    goes to [facility] for this  . Migraine    managed by neurologist  . Osteoporosis     Past Surgical History:  Procedure Laterality Date  . ABDOMINAL ADHESION SURGERY    . ABDOMINAL HYSTERECTOMY    . APPENDECTOMY    . CHOLECYSTECTOMY    . COLON SURGERY    . CYSTOSCOPY W/ URETERAL STENT PLACEMENT Right 04/27/2016   Procedure: CYSTOSCOPY WITH RETROGRADE PYELOGRAM/ RIGHT URETERAL STENT PLACEMENT;  Surgeon: Ardis Hughs, MD;  Location: [facility];  Service: Urology;  Laterality: Right;  . OVARIAN CYST SURGERY    . SMALL INTESTINE SURGERY      Medications: Prior to Admission medications   Medication Sig Start Date End Date Taking? Authorizing Provider  acetaminophen (TYLENOL) 500 MG tablet Take 1,000 mg by mouth every 6 (six) hours as needed for moderate pain.   Yes Historical Provider, MD  ADDERALL XR 30 MG 24  hr capsule Take 30 mg by mouth every morning. 09/08/16  Yes Historical Provider, MD  alendronate (FOSAMAX) 70 MG tablet take 1 tablet by mouth every week Patient taking differently: take 70mg  tablet by mouth every saturday 10/11/16  Yes Renato Shin, MD  BIOTIN PO Take 1 capsule by mouth daily.    Yes Historical Provider, MD  cycloSPORINE (RESTASIS) 0.05 % ophthalmic emulsion Place 1 drop into both eyes 2 (two) times daily.    Yes Historical Provider, MD  hyoscyamine (LEVBID) 0.375 MG 12 hr tablet Take 0.375 mg by mouth 2 (two) times daily.   Yes Historical Provider, MD  LINZESS 290 MCG CAPS capsule Take 290 mg by mouth daily before breakfast. 05/22/15  Yes Historical Provider, MD  metaxalone (SKELAXIN) 800 MG tablet take 800mg  tablet by mouth three times a day if needed for pain 02/25/16  Yes Historical Provider, MD    methocarbamol (ROBAXIN) 500 MG tablet Take 500 mg by mouth every 6 (six) hours as needed for muscle spasms.  04/27/16  Yes Historical Provider, MD  OLANZapine-FLUoxetine (SYMBYAX) 3-25 MG capsule Take 1 capsule by mouth at bedtime. 03/04/16  Yes Historical Provider, MD  omeprazole (PRILOSEC) 40 MG capsule Take 40 mg by mouth every morning. 04/15/16  Yes Historical Provider, MD  potassium chloride (K-DUR) 10 MEQ tablet Take 1 tablet (10 mEq total) by mouth 2 (two) times daily. 11/04/16  Yes Waynetta Pean, PA-C  protriptyline (VIVACTIL) 10 MG tablet Take 10 mg by mouth at bedtime.   Yes Historical Provider, MD  SUMAtriptan (IMITREX) 100 MG tablet Take 100 mg by mouth every 2 (two) hours as needed for migraine or headache. May repeat in 2 hours if headache persists or recurs.   Yes Historical Provider, MD  topiramate (TOPAMAX) 100 MG tablet Take 1 tablet (100 mg total) by mouth 2 (two) times daily. Patient taking differently: Take 100-200 mg by mouth 2 (two) times daily. Take 100mg  in the morning and Take 200mg  at bedtime 04/11/15  Yes Theodis Blaze, MD  traZODone (DESYREL) 50 MG tablet Take 50 mg by mouth at bedtime.   Yes Historical Provider, MD  ZENPEP 15000 units CPEP Take 45,000 mg by mouth 3 (three) times daily as needed for other. Before meals 08/16/16  Yes Historical Provider, MD  mupirocin ointment (BACTROBAN) 2 % Place 1 application into the nose 2 (two) times daily. Patient not taking: Reported on 11/06/2016 07/20/16   Lucretia Kern, DO  traMADol (ULTRAM) 50 MG tablet Take 1-2 tablets (50-100 mg total) by mouth every 6 (six) hours as needed for moderate pain. Patient not taking: Reported on 11/06/2016 04/27/16   Ardis Hughs, MD    Allergies:   Allergies  Allergen Reactions  . Sulfonamide Derivatives Swelling    Social History:  reports that she has never smoked. She has never used smokeless tobacco. She reports that she does not drink alcohol or use drugs.  Family History: Family  History  Problem Relation Age of Onset  . Kidney disease Mother   . Heart disease Mother   . Cancer Maternal Aunt     intestinal and liver cancer  . Stroke Maternal Grandmother   . Stroke    . Depression      everyone  . Hypercalcemia Neg Hx     Physical Exam: Blood pressure 134/78, pulse 104, temperature 98 F (36.7 C), temperature source Oral, resp. rate 16, height 5\' 1"  (1.549 m), weight 52.2 kg (115 lb), SpO2 94 %. General: Alert,  awake, oriented x self 3, in no acute distress. HEENT: normocephalic, atraumatic, anicteric sclera, pink conjunctiva, pupils equal and reactive to light and accomodation, oropharynx clear Neck: supple, no masses or lymphadenopathy, no goiter, no bruits , dry mucous membranes Heart: Regular rate and rhythm, without murmurs, rubs or gallops. Lungs: Clear to auscultation bilaterally, no wheezing, rales or rhonchi. Abdomen: Soft, nontender, nondistended, positive bowel sounds, no masses. Extremities: No clubbing, cyanosis or edema with positive pedal pulses. Neuro: Grossly intact, no focal neurological deficits, strength 5/5 upper and lower extremities bilaterally Psych: alert and oriented x self, confused Skin: no rashes or lesions, warm and dry   LABS on Admission:  Basic Metabolic Panel:  Recent Labs Lab 11/04/16 1035 11/06/16 1251  NA 131* 138  K 2.6* 3.5  CL 105 116*  CO2 14* 9*  GLUCOSE 117* 159*  BUN 29* 38*  CREATININE 1.27* 1.67*  CALCIUM 10.0 9.1  MG 2.7*  --    Liver Function Tests:  Recent Labs Lab 11/06/16 1251  AST 30  ALT 30  ALKPHOS 86  BILITOT 0.9  PROT 8.1  ALBUMIN 4.1   No results for input(s): LIPASE, AMYLASE in the last 168 hours. No results for input(s): AMMONIA in the last 168 hours. CBC:  Recent Labs Lab 11/04/16 1035 11/06/16 1251  WBC 14.3* 19.8*  NEUTROABS  --  19.2*  HGB 16.4* 16.7*  HCT 45.7 47.3*  MCV 90.7 92.6  PLT 362 373   Cardiac Enzymes: No results for input(s): CKTOTAL, CKMB,  CKMBINDEX, TROPONINI in the last 168 hours. BNP: Invalid input(s): POCBNP CBG: No results for input(s): GLUCAP in the last 168 hours.  Radiological Exams on Admission:  No results found.  *I have personally reviewed the images above*   Assessment/Plan Principal Problem:   Sepsis (HCC)Likely secondary to UTI - Admit to stepdown, place on IV fluid hydration, obtain procalcitonin, blood cultures, also check influenza panel  - Place on IV Rocephin  Active Problems:   GERD (gastroesophageal reflux disease) - Place on PPI    AKI (acute kidney injury) ([place]) - Creatinine on admission 1.67, baseline 1.2 on 12/7 - Continue IV fluid gentle hydration    Acute encephalopathy - Likely due to #1, CT head normal    UTI (urinary tract infection) - As #1, placed on IV fluid hydration and continue IV Rocephin, follow urine culture and sensitivities  DVT prophylaxis: heparin sq  CODE STATUS: full code per husband   Consults called: none   Family Communication: Admission, patients condition and plan of care including tests being ordered have been discussed with the patient's husband who indicates understanding and agree with the plan and Code Status  Admission status: inpatient, step down   Disposition plan: Further plan will depend as patient's clinical course evolves and further radiologic and laboratory data become available.  At the time of admission, it appears that the appropriate admission status for this patient is INPATIENT . This is judged to be reasonable and necessary in order to provide the required intensity of service to ensure the patient's safety given the presenting symptoms, physical exam findings, and initial radiographic and laboratory data in the context of their chronic comorbidities.    Time Spent on Admission: 37mins  Joana Nolton M.D. [facility] 11/06/2016, 3:17 PM Pager: [phone]  If 7PM-7AM, please contact night-coverage [email or web address] Password  [email or web address]

## 2016-11-06 NOTE — Progress Notes (Signed)
CRITICAL VALUE ALERT  Critical value received:  K+ 2.5 Date of notification: 11/06/2016  Time of notification: 1725  Critical value read back: yes  Nurse who received alert:  Lacinda Axon RN  MD notified (1st page):  Dr. Tana Coast  Time of first page:  1727  MD notified (2nd page): n/a     Time of second page:n/a Responding MD:  **Dr. Tana Coast Time MD responded:  17:34

## 2016-11-06 NOTE — ED Notes (Signed)
Pt taken to xray 

## 2016-11-07 ENCOUNTER — Encounter (HOSPITAL_COMMUNITY): Payer: Self-pay

## 2016-11-07 LAB — CBC
HCT: 40.3 % (ref 36.0–46.0)
Hemoglobin: 14 g/dL (ref 12.0–15.0)
MCH: 32 pg (ref 26.0–34.0)
MCHC: 34.7 g/dL (ref 30.0–36.0)
MCV: 92 fL (ref 78.0–100.0)
Platelets: 320 10*3/uL (ref 150–400)
RBC: 4.38 MIL/uL (ref 3.87–5.11)
RDW: 13.2 % (ref 11.5–15.5)
WBC: 15.6 10*3/uL — ABNORMAL HIGH (ref 4.0–10.5)

## 2016-11-07 LAB — BASIC METABOLIC PANEL
Anion gap: 11 (ref 5–15)
BUN: 33 mg/dL — ABNORMAL HIGH (ref 6–20)
CO2: 9 mmol/L — ABNORMAL LOW (ref 22–32)
Calcium: 7.7 mg/dL — ABNORMAL LOW (ref 8.9–10.3)
Chloride: 119 mmol/L — ABNORMAL HIGH (ref 101–111)
Creatinine, Ser: 1.1 mg/dL — ABNORMAL HIGH (ref 0.44–1.00)
GFR calc Af Amer: 56 mL/min — ABNORMAL LOW (ref >60)
GFR calc non Af Amer: 49 mL/min — ABNORMAL LOW (ref >60)
Glucose, Bld: 108 mg/dL — ABNORMAL HIGH (ref 65–99)
Potassium: 2.7 mmol/L — CL (ref 3.5–5.1)
Sodium: 139 mmol/L (ref 135–145)

## 2016-11-07 LAB — MAGNESIUM: Magnesium: 2.1 mg/dL (ref 1.7–2.4)

## 2016-11-07 MED ORDER — OLANZAPINE-FLUOXETINE HCL 3-25 MG PO CAPS
1.0000 | ORAL_CAPSULE | Freq: Every day | ORAL | Status: DC
  Administered 2016-11-07 – 2016-11-09 (×3): 1 via ORAL
  Filled 2016-11-07 (×4): qty 1

## 2016-11-07 MED ORDER — LINACLOTIDE 290 MCG PO CAPS
290.0000 ug | ORAL_CAPSULE | Freq: Every day | ORAL | Status: DC
  Administered 2016-11-07 – 2016-11-08 (×2): 290 ug via ORAL
  Filled 2016-11-07 (×2): qty 1

## 2016-11-07 MED ORDER — PROTRIPTYLINE HCL 10 MG PO TABS
10.0000 mg | ORAL_TABLET | Freq: Every day | ORAL | Status: DC
  Administered 2016-11-09: 10 mg via ORAL

## 2016-11-07 MED ORDER — STERILE WATER FOR INJECTION IV SOLN
INTRAVENOUS | Status: DC
  Administered 2016-11-07 – 2016-11-09 (×5): via INTRAVENOUS
  Filled 2016-11-07 (×10): qty 850

## 2016-11-07 MED ORDER — PANCRELIPASE (LIP-PROT-AMYL) 12000-38000 UNITS PO CPEP
12000.0000 [IU] | ORAL_CAPSULE | Freq: Three times a day (TID) | ORAL | Status: DC
  Administered 2016-11-07 – 2016-11-10 (×10): 12000 [IU] via ORAL
  Filled 2016-11-07 (×13): qty 1

## 2016-11-07 MED ORDER — PROTRIPTYLINE HCL 10 MG PO TABS
10.0000 mg | ORAL_TABLET | Freq: Every day | ORAL | Status: DC
  Filled 2016-11-07 (×3): qty 1

## 2016-11-07 MED ORDER — SODIUM CHLORIDE 0.9 % IV SOLN
Freq: Once | INTRAVENOUS | Status: AC
  Administered 2016-11-07: 05:00:00 via INTRAVENOUS
  Filled 2016-11-07: qty 1000

## 2016-11-07 MED ORDER — HYOSCYAMINE SULFATE ER 0.375 MG PO TB12
0.3750 mg | ORAL_TABLET | Freq: Two times a day (BID) | ORAL | Status: DC
  Administered 2016-11-07 – 2016-11-10 (×7): 0.375 mg via ORAL
  Filled 2016-11-07 (×9): qty 1

## 2016-11-07 MED ORDER — POTASSIUM CHLORIDE IN NACL 40-0.9 MEQ/L-% IV SOLN
INTRAVENOUS | Status: DC
  Administered 2016-11-07: 65 mL/h via INTRAVENOUS
  Filled 2016-11-07: qty 1000

## 2016-11-07 NOTE — Progress Notes (Signed)
PROGRESS NOTE    Danielle Gaines  [id number]  DOB: 1941/02/05  DOA: 11/06/2016 PCP: Lucretia Kern., DO Outpatient Specialists:  Hospital course: Patient is a 75 year old female with history of arthritis, chronic diastolic CHF, GERD, IBS presented to ED with altered mental status, generalized weakness. History was obtained from patient's husband at the bedside. She was seen in ED on 12/7, 3 days ago and was treated for hypokalemia. Per patient's husband, for last 1 week she has been feeling generalized weakness, has been unsteady on her feet however in the last 3 days she has been progressively getting worse. 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 This morning, patient was significantly worse, confused, febrile. Patient is unable to provide any history herself. Per husband, she had 2 episodes of vomiting, was complaining of lower abdominal pain. She had no coughing or URI symptoms. She had been complaining of myalgias. She had been significantly unsteady and weak that he had been helping her to walk in the house.  Assessment & Plan:     Sepsis secondary to UTI - Admitted to stepdown, blood cultures pending, UC pending, influenza panel negative - Continue IV Rocephin    GERD (gastroesophageal reflux disease) - Continue PPI    AKI (acute kidney injury) ([place]) - Creatinine on admission 1.67, baseline 1.2 on 12/7 - Continue IV fluid gentle hydration, Follow I/O.  - Improving with hydration, avoid nephrotoxins    Acute encephalopathy - Likely due to #1, CT head normal, improving with supportive therapy, get PT eval  Hypokalemia - continue IV replacement, Magnesium normal.  Follow.  Telemetry.    UTI (urinary tract infection) - Continue IV fluid hydration and continue IV Rocephin, follow urine culture and sensitivities  Psych - resume home psychiatric medications that she takes at bedtime  - Pt reports that she only takes adderall once every 3  days  Chronic Constipation - Pt takes Linzess daily and reports that it helps tremendously, will restart this morning.  DVT prophylaxis: heparin sq  CODE STATUS: full code per husband   Subjective: Pt more alert and feeling better this morning.  Not eating much.    Objective: Vitals:   11/07/16 0400 11/07/16 0500 11/07/16 0600 11/07/16 0700  BP: (!) 132/53  (!) 126/59   Pulse: 96 95 100   Resp: 17 17 20    Temp: 99.7 F (37.6 C)   99.1 F (37.3 C)  TempSrc: Axillary   Oral  SpO2: 100% 100% 100%   Weight:      Height:        Intake/Output Summary (Last 24 hours) at 11/07/16 0808 Last data filed at 11/07/16 0700  Gross per 24 hour  Intake             2475 ml  Output              550 ml  Net             1925 ml   Filed Weights   11/06/16 1256 11/06/16 1600  Weight: 52.2 kg (115 lb) 53 kg (116 lb 13.5 oz)    Exam:  General: Alert, awake, oriented x self 3, in no acute distress. HEENT: normocephalic, atraumatic, anicteric sclera, pink conjunctiva, pupils equal and reactive to light and accomodation, Neck: supple, no masses or lymphadenopathy, no goiter, no bruits, persistent dry mucous membranes Heart: Regular rate and rhythm, without murmurs, rubs or gallops. Lungs: Clear to auscultation bilaterally, no wheezing, rales or rhonchi.  Abdomen: Soft, nontender, nondistended, positive bowel sounds, no masses. Extremities: No clubbing, cyanosis or edema with positive pedal pulses. Neuro: Grossly intact, no focal neurological deficits, strength 5/5 upper and lower extremities bilaterally Psych: alert and oriented x self, confused Skin: no rashes or lesions, warm and dry  Data Reviewed: Basic Metabolic Panel:  Recent Labs Lab 11/04/16 1035 11/06/16 1251 11/06/16 1633 11/06/16 1825 11/06/16 2216 11/07/16 0323  NA 131* 138 139  --   --  139  K 2.6* 3.5 2.5*  --  2.9* 2.7*  CL 105 116* 118*  --   --  119*  CO2 14* 9* 9*  --   --  9*  GLUCOSE 117* 159* 136*  --   --   108*  BUN 29* 38* 36*  --   --  33*  CREATININE 1.27* 1.67* 1.41*  --   --  1.10*  CALCIUM 10.0 9.1 7.8*  --   --  7.7*  MG 2.7*  --   --  2.3  --  2.1   Liver Function Tests:  Recent Labs Lab 11/06/16 1251 11/06/16 1633  AST 30 29  ALT 30 28  ALKPHOS 86 70  BILITOT 0.9 0.5  PROT 8.1 7.2  ALBUMIN 4.1 3.4*   No results for input(s): LIPASE, AMYLASE in the last 168 hours. No results for input(s): AMMONIA in the last 168 hours. CBC:  Recent Labs Lab 11/04/16 1035 11/06/16 1251 11/07/16 0323  WBC 14.3* 19.8* 15.6*  NEUTROABS  --  19.2*  --   HGB 16.4* 16.7* 14.0  HCT 45.7 47.3* 40.3  MCV 90.7 92.6 92.0  PLT 362 373 320   Cardiac Enzymes: No results for input(s): CKTOTAL, CKMB, CKMBINDEX, TROPONINI in the last 168 hours. CBG (last 3)  No results for input(s): GLUCAP in the last 72 hours. Recent Results (from the past 240 hour(s))  MRSA PCR Screening     Status: None   Collection Time: 11/06/16  3:50 PM  Result Value Ref Range Status   MRSA by PCR NEGATIVE NEGATIVE Final    Comment:        The GeneXpert MRSA Assay (FDA approved for NASAL specimens only), is one component of a comprehensive MRSA colonization surveillance program. It is not intended to diagnose MRSA infection nor to guide or monitor treatment for MRSA infections.      Studies: Dg Chest 2 View  Result Date: 11/06/2016 CLINICAL DATA:  Fever. EXAM: CHEST  2 VIEW COMPARISON:  November 04, 2016 FINDINGS: The heart size and mediastinal contours are within normal limits. Both lungs are clear. The visualized skeletal structures are unremarkable. IMPRESSION: No active cardiopulmonary disease. Electronically Signed   By: Dorise Bullion III M.D   On: 11/06/2016 13:13   Scheduled Meds: . cefTRIAXone (ROCEPHIN)  IV  1 g Intravenous Q24H  . chlorhexidine  15 mL Mouth Rinse BID  . cycloSPORINE  1 drop Both Eyes BID  . heparin  5,000 Units Subcutaneous Q8H  . hyoscyamine  0.375 mg Oral BID  . linaclotide   290 mcg Oral QAC breakfast  . lipase/protease/amylase  12,000 Units Oral TID WC  . mouth rinse  15 mL Mouth Rinse q12n4p  . OLANZapine-FLUoxetine  1 capsule Oral QHS  . pantoprazole  40 mg Oral Q0600  . protriptyline  10 mg Oral QHS  . topiramate  200 mg Oral QHS   Continuous Infusions: . 0.9 % NaCl with KCl 40 mEq / L     Principal Problem:  Sepsis ([place]) Active Problems:   GERD (gastroesophageal reflux disease)   AKI (acute kidney injury) ([place])   Acute encephalopathy   UTI (urinary tract infection)  Critical Care Time spent: 26 mins  Irwin Brakeman, MD, FAAFP [facility] Pager [phone]  If 7PM-7AM, please contact night-coverage [email or web address] Password [email or web address] 11/07/2016, 8:08 AM    LOS: 1 day

## 2016-11-07 NOTE — Progress Notes (Signed)
CRITICAL VALUE ALERT  Critical value received:  K 2.7  Date of notification:  11/07/16  Time of notification:  0430  Critical value read back:Yes.    Nurse who received alert:  Kennis Carina, RN   MD notified (1st page):  Hal Hope  Time of first page:  0430  MD notified (2nd page):  Time of second page:  Responding MD:  Hal Hope  Time MD responded:  [phone]

## 2016-11-08 DIAGNOSIS — E872 Acidosis: Secondary | ICD-10-CM

## 2016-11-08 DIAGNOSIS — E440 Moderate protein-calorie malnutrition: Secondary | ICD-10-CM | POA: Insufficient documentation

## 2016-11-08 LAB — CBC WITH DIFFERENTIAL/PLATELET
Basophils Absolute: 0 10*3/uL (ref 0.0–0.1)
Basophils Relative: 0 %
Eosinophils Absolute: 0.1 10*3/uL (ref 0.0–0.7)
Eosinophils Relative: 1 %
HCT: 35.6 % — ABNORMAL LOW (ref 36.0–46.0)
Hemoglobin: 12.8 g/dL (ref 12.0–15.0)
Lymphocytes Relative: 11 %
Lymphs Abs: 1.4 10*3/uL (ref 0.7–4.0)
MCH: 31.6 pg (ref 26.0–34.0)
MCHC: 36 g/dL (ref 30.0–36.0)
MCV: 87.9 fL (ref 78.0–100.0)
Monocytes Absolute: 0.9 10*3/uL (ref 0.1–1.0)
Monocytes Relative: 7 %
Neutro Abs: 10.5 10*3/uL — ABNORMAL HIGH (ref 1.7–7.7)
Neutrophils Relative %: 81 %
Platelets: 286 10*3/uL (ref 150–400)
RBC: 4.05 MIL/uL (ref 3.87–5.11)
RDW: 13.1 % (ref 11.5–15.5)
WBC: 12.8 10*3/uL — ABNORMAL HIGH (ref 4.0–10.5)

## 2016-11-08 LAB — URINE CULTURE: Culture: >=100000 — AB

## 2016-11-08 LAB — COMPREHENSIVE METABOLIC PANEL
ALT: 21 U/L (ref 14–54)
AST: 24 U/L (ref 15–41)
Albumin: 3 g/dL — ABNORMAL LOW (ref 3.5–5.0)
Alkaline Phosphatase: 65 U/L (ref 38–126)
Anion gap: 10 (ref 5–15)
BUN: 28 mg/dL — ABNORMAL HIGH (ref 6–20)
CO2: 15 mmol/L — ABNORMAL LOW (ref 22–32)
Calcium: 7.8 mg/dL — ABNORMAL LOW (ref 8.9–10.3)
Chloride: 111 mmol/L (ref 101–111)
Creatinine, Ser: 0.86 mg/dL (ref 0.44–1.00)
GFR calc Af Amer: >60 mL/min (ref >60)
GFR calc non Af Amer: >60 mL/min (ref >60)
Glucose, Bld: 86 mg/dL (ref 65–99)
Potassium: 2.5 mmol/L — CL (ref 3.5–5.1)
Sodium: 136 mmol/L (ref 135–145)
Total Bilirubin: 0.7 mg/dL (ref 0.3–1.2)
Total Protein: 6.2 g/dL — ABNORMAL LOW (ref 6.5–8.1)

## 2016-11-08 MED ORDER — SODIUM CHLORIDE 0.9 % IV SOLN
30.0000 meq | Freq: Once | INTRAVENOUS | Status: AC
  Administered 2016-11-08: 30 meq via INTRAVENOUS
  Filled 2016-11-08: qty 15

## 2016-11-08 MED ORDER — SACCHAROMYCES BOULARDII 250 MG PO CAPS
250.0000 mg | ORAL_CAPSULE | Freq: Two times a day (BID) | ORAL | Status: DC
  Administered 2016-11-08 – 2016-11-10 (×4): 250 mg via ORAL
  Filled 2016-11-08 (×4): qty 1

## 2016-11-08 MED ORDER — DIPHENOXYLATE-ATROPINE 2.5-0.025 MG/5ML PO LIQD
10.0000 mL | Freq: Four times a day (QID) | ORAL | Status: DC | PRN
  Administered 2016-11-09: 10 mL via ORAL
  Filled 2016-11-08 (×2): qty 10

## 2016-11-08 MED ORDER — POTASSIUM CHLORIDE 20 MEQ/15ML (10%) PO SOLN
40.0000 meq | Freq: Once | ORAL | Status: AC
  Administered 2016-11-08: 40 meq via ORAL
  Filled 2016-11-08: qty 30

## 2016-11-08 MED ORDER — ENSURE ENLIVE PO LIQD
237.0000 mL | Freq: Two times a day (BID) | ORAL | Status: DC
  Administered 2016-11-08 – 2016-11-10 (×4): 237 mL via ORAL

## 2016-11-08 MED ORDER — CIPROFLOXACIN HCL 500 MG PO TABS
500.0000 mg | ORAL_TABLET | Freq: Two times a day (BID) | ORAL | Status: DC
  Administered 2016-11-08 – 2016-11-10 (×4): 500 mg via ORAL
  Filled 2016-11-08 (×4): qty 1

## 2016-11-08 MED ORDER — POTASSIUM CHLORIDE CRYS ER 20 MEQ PO TBCR
40.0000 meq | EXTENDED_RELEASE_TABLET | Freq: Three times a day (TID) | ORAL | Status: AC
  Administered 2016-11-08 (×3): 40 meq via ORAL
  Filled 2016-11-08 (×3): qty 2

## 2016-11-08 NOTE — Progress Notes (Signed)
PROGRESS NOTE    Danielle Gaines  [id number]  DOB: 1941-11-11  DOA: 11/06/2016 PCP: Lucretia Kern., DO Outpatient Specialists:  Hospital course: Patient is a 75 year old female with history of arthritis, chronic diastolic CHF, GERD, IBS presented to ED with altered mental status, generalized weakness. History was obtained from patient's husband at the bedside. She was seen in ED on 12/7, 3 days ago and was treated for hypokalemia. Per patient's husband, for last 1 week she has been feeling generalized weakness, has been unsteady on her feet however in the last 3 days she has been progressively getting worse. 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 This morning, patient was significantly worse, confused, febrile. Patient is unable to provide any history herself. Per husband, she had 2 episodes of vomiting, was complaining of lower abdominal pain. She had no coughing or URI symptoms. She had been complaining of myalgias. She had been significantly unsteady and weak that he had been helping her to walk in the house.  Assessment & Plan:     Sepsis secondary to UTI - Admitted to stepdown, blood cultures pending, UC pending, influenza panel negative - Continue IV Rocephin pending C&S results.  - Urine culture growing gram negative rods.  Blood cultures No growth to date.     GERD (gastroesophageal reflux disease) - Continue PPI therapy.     AKI (acute kidney injury) with metabolic acidosis - Creatinine on admission 1.67, baseline 1.2 on 12/7 - Continue IV fluid gentle hydration, Follow I/O.  - Acidosis improving with bicarbonate infusion, renal function improving with hydration, avoid nephrotoxins    Acute encephalopathy - resolved now, getting back to baseline - Likely due to #1, CT head normal, improving with supportive therapy, get PT eval  Hypokalemia - added oral and IV replacement, Magnesium normal.  Follow.  Telemetry.    UTI (urinary tract  infection) - Continue IV fluid hydration and continue IV Rocephin, follow urine culture and sensitivities  Psych - resume home psychiatric medications that she takes at bedtime  - Pt reports that she only takes adderall once every 3 days so did not reorder  Chronic Constipation - Continue Linzess daily, had BM yesterday  DVT prophylaxis: heparin sq  CODE STATUS: full code  Subjective: Pt says that she slept well and she wants to try eating something today.  Had a bowel movement.      Objective: Vitals:   11/07/16 1550 11/07/16 1948 11/07/16 2130 11/08/16 0533  BP: (!) 102/55 134/63 (!) 133/57 (!) 120/45  Pulse: 93 89 86 65  Resp:  18 18 16   Temp: 97.9 F (36.6 C) 98.1 F (36.7 C) 98.2 F (36.8 C) 97.8 F (36.6 C)  TempSrc: Oral Oral Oral Oral  SpO2: 96% 100% 100% 94%  Weight:      Height:        Intake/Output Summary (Last 24 hours) at 11/08/16 0816 Last data filed at 11/08/16 0700  Gross per 24 hour  Intake             2210 ml  Output             1250 ml  Net              960 ml   Filed Weights   11/06/16 1256 11/06/16 1600  Weight: 52.2 kg (115 lb) 53 kg (116 lb 13.5 oz)    Exam:  General: Alert, awake, oriented x self 3, in no acute distress. HEENT:  normocephalic, atraumatic, anicteric sclera, pink conjunctiva, pupils equal and reactive to light and accomodation, Neck: supple, no masses or lymphadenopathy, no goiter, no bruits, persistent dry mucous membranes Heart: Regular rate and rhythm, without murmurs, rubs or gallops. Lungs: Clear to auscultation bilaterally, no wheezing, rales or rhonchi. Abdomen: Soft, nontender, nondistended, positive bowel sounds, no masses. Extremities: No clubbing, cyanosis or edema with positive pedal pulses. Neuro: Grossly intact, no focal neurological deficits, strength 5/5 upper and lower extremities bilaterally Psych: alert and oriented x self, confused Skin: no rashes or lesions, warm and dry  Data Reviewed: Basic  Metabolic Panel:  Recent Labs Lab 11/04/16 1035 11/06/16 1251 11/06/16 1633 11/06/16 1825 11/06/16 2216 11/07/16 0323 11/08/16 0440  NA 131* 138 139  --   --  139 136  K 2.6* 3.5 2.5*  --  2.9* 2.7* 2.5*  CL 105 116* 118*  --   --  119* 111  CO2 14* 9* 9*  --   --  9* 15*  GLUCOSE 117* 159* 136*  --   --  108* 86  BUN 29* 38* 36*  --   --  33* 28*  CREATININE 1.27* 1.67* 1.41*  --   --  1.10* 0.86  CALCIUM 10.0 9.1 7.8*  --   --  7.7* 7.8*  MG 2.7*  --   --  2.3  --  2.1  --    Liver Function Tests:  Recent Labs Lab 11/06/16 1251 11/06/16 1633 11/08/16 0440  AST 30 29 24   ALT 30 28 21   ALKPHOS 86 70 65  BILITOT 0.9 0.5 0.7  PROT 8.1 7.2 6.2*  ALBUMIN 4.1 3.4* 3.0*   No results for input(s): LIPASE, AMYLASE in the last 168 hours. No results for input(s): AMMONIA in the last 168 hours. CBC:  Recent Labs Lab 11/04/16 1035 11/06/16 1251 11/07/16 0323 11/08/16 0440  WBC 14.3* 19.8* 15.6* 12.8*  NEUTROABS  --  19.2*  --  10.5*  HGB 16.4* 16.7* 14.0 12.8  HCT 45.7 47.3* 40.3 35.6*  MCV 90.7 92.6 92.0 87.9  PLT 362 373 320 286   Cardiac Enzymes: No results for input(s): CKTOTAL, CKMB, CKMBINDEX, TROPONINI in the last 168 hours. CBG (last 3)  No results for input(s): GLUCAP in the last 72 hours. Recent Results (from the past 240 hour(s))  Urine culture     Status: Abnormal (Preliminary result)   Collection Time: 11/06/16 12:29 PM  Result Value Ref Range Status   Specimen Description URINE, CATHETERIZED  Final   Special Requests NONE  Final   Culture >=100,000 COLONIES/mL GRAM NEGATIVE RODS (A)  Final   Report Status PENDING  Incomplete  Culture, blood (Routine x 2)     Status: None (Preliminary result)   Collection Time: 11/06/16 12:48 PM  Result Value Ref Range Status   Specimen Description BLOOD RIGHT ARM  Final   Special Requests IN PEDIATRIC BOTTLE 3CC  Final   Culture   Final    NO GROWTH < 24 HOURS Performed at [facility]    Report Status  PENDING  Incomplete  Culture, blood (Routine x 2)     Status: None (Preliminary result)   Collection Time: 11/06/16 12:51 PM  Result Value Ref Range Status   Specimen Description BLOOD LEFT ARM  Final   Special Requests IN PEDIATRIC BOTTLE [place]  Final   Culture   Final    NO GROWTH < 24 HOURS Performed at [facility]    Report Status  PENDING  Incomplete  MRSA PCR Screening     Status: None   Collection Time: 11/06/16  3:50 PM  Result Value Ref Range Status   MRSA by PCR NEGATIVE NEGATIVE Final    Comment:        The GeneXpert MRSA Assay (FDA approved for NASAL specimens only), is one component of a comprehensive MRSA colonization surveillance program. It is not intended to diagnose MRSA infection nor to guide or monitor treatment for MRSA infections.   Culture, blood (x 2)     Status: None (Preliminary result)   Collection Time: 11/06/16  4:33 PM  Result Value Ref Range Status   Specimen Description BLOOD LEFT ANTECUBITAL  Final   Special Requests IN PEDIATRIC BOTTLE 0.5CC  Final   Culture   Final    NO GROWTH < 24 HOURS Performed at [facility]    Report Status PENDING  Incomplete     Studies: Dg Chest 2 View  Result Date: 11/06/2016 CLINICAL DATA:  Fever. EXAM: CHEST  2 VIEW COMPARISON:  November 04, 2016 FINDINGS: The heart size and mediastinal contours are within normal limits. Both lungs are clear. The visualized skeletal structures are unremarkable. IMPRESSION: No active cardiopulmonary disease. Electronically Signed   By: Dorise Bullion III M.D   On: 11/06/2016 13:13   Scheduled Meds: . cefTRIAXone (ROCEPHIN)  IV  1 g Intravenous Q24H  . chlorhexidine  15 mL Mouth Rinse BID  . cycloSPORINE  1 drop Both Eyes BID  . heparin  5,000 Units Subcutaneous Q8H  . hyoscyamine  0.375 mg Oral BID  . linaclotide  290 mcg Oral QAC breakfast  . lipase/protease/amylase  12,000 Units Oral TID WC  . mouth rinse  15 mL Mouth Rinse q12n4p  . OLANZapine-FLUoxetine   1 capsule Oral QHS  . pantoprazole  40 mg Oral Q0600  . potassium chloride (KCL MULTIRUN) 30 mEq in 265 mL IVPB  30 mEq Intravenous Once  . potassium chloride  40 mEq Oral TID  . protriptyline  10 mg Oral QHS  . topiramate  200 mg Oral QHS   Continuous Infusions: .  sodium bicarbonate 150 mEq in sterile water 1000 mL infusion 100 mL/hr at 11/08/16 0250   Principal Problem:   Sepsis ([place]) Active Problems:   GERD (gastroesophageal reflux disease)   AKI (acute kidney injury) ([place])   Acute encephalopathy   UTI (urinary tract infection)  Time spent: 32 mins  Irwin Brakeman, MD, FAAFP [facility] Pager [phone]  If 7PM-7AM, please contact night-coverage [email or web address] Password [email or web address] 11/08/2016, 8:16 AM    LOS: 2 days

## 2016-11-08 NOTE — Progress Notes (Signed)
Initial Nutrition Assessment  DOCUMENTATION CODES:   Non-severe (moderate) malnutrition in context of acute illness/injury  INTERVENTION:   -Provide Ensure Enlive po BID, each supplement provides 350 kcal and 20 grams of protein, mixed with ice cream -Encourage PO intake -Spoke with pt's husband over the phone regarding nutrition plan and supplement options -RD to continue to monitor  NUTRITION DIAGNOSIS:   Inadequate oral intake related to lethargy/confusion as evidenced by per patient/family report.  GOAL:   Patient will meet greater than or equal to 90% of their needs  MONITOR:   PO intake, Supplement acceptance, Labs, Weight trends, I & O's  REASON FOR ASSESSMENT:   Consult Assessment of nutrition requirement/status  ASSESSMENT:   75 year old female with history of arthritis, chronic diastolic CHF, GERD, IBS presented to ED with altered mental status, generalized weakness. History was obtained from patient's husband at the bedside. She was seen in ED on 12/7, 3 days ago and was treated for hypokalemia. Per patient's husband, for last 1 week she has been feeling generalized weakness, has been unsteady on her feet however in the last 3 days she has been progressively getting worse. 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 This morning, patient was significantly worse, confused, febrile. Patient is unable to provide any history herself. Per husband, she had 2 episodes of vomiting, was complaining of lower abdominal pain.   Spoke with RN who states pt's husband was wanting to speak with the dietitian regarding pt's nutrition options. Pt has not eaten for 3 days since admission 12/9. RN also states pt has had large amounts of loose stools (1600 ml, green in color).  Pt unable to provide much history during visit at bedside. Pt reports drinking a "banana" drink at home but unable to recall brand name. Pt states her UBW is 115 lb. She also reports  drinking a "slim fast" today. States her appetite is fair, improves in the late afternoon.    Per discussion on the phone with pt's husband, pt drinks banana flavored Muscle Milk. This is the only supplement she has been accepting at home. Informed pt's husband that the pt drank an Ensure with ice cream today. States he just wants her to have good nutrition. States the pt has not been eating for 3 days now. Pt a "particular eater". Not a big meat eater but occasionally eats grilled nuggets from [place], a steak or pork. Lately, she has been wanting banana and peanut butter sandwiches.   Per chart review, pt has lost 19 lb since March 2017 (14% wt loss x 9 months, insignificant for time frame). Nutrition-Focused physical exam completed. Findings are moderate fat depletion, moderate muscle depletion, and no edema.   Medications: Creon capsule TID, Protonix tablet daily, K-DUR tablet TID, Zofran IV PRN Labs reviewed: Low K Mg WNL  Diet Order:  Diet Heart Room service appropriate? Yes; Fluid consistency: Thin  Skin:  Reviewed, no issues  Last BM:  12/11 - loose (large volume per RN)  Height:   Ht Readings from Last 1 Encounters:  11/06/16 5\' 2"  (1.575 m)    Weight:   Wt Readings from Last 1 Encounters:  11/06/16 116 lb 13.5 oz (53 kg)    Ideal Body Weight:  50 kg  BMI:  Body mass index is 21.37 kg/m.  Estimated Nutritional Needs:   Kcal:  1350-1550  Protein:  65-75g  Fluid:  1.7-1.9L/day  EDUCATION NEEDS:   Education needs addressed  Clayton Bibles, MS, RD,  LDN Pager: [phone] After Hours Pager: [phone]

## 2016-11-08 NOTE — Progress Notes (Signed)
CRITICAL VALUE ALERT  Critical value received:  Potassium 2.5  Date of notification:  11/08/16  Time of notification:  [id number]  Critical value read back:Yes.    Nurse who received alert:  Reynold Bowen, RN  MD notified (1st page):  [place]  Time of first page:  [phone]  MD notified (2nd page):  Time of second page:  Responding MD:  Schorr  Time MD responded:  [phone]  Potassium ordered

## 2016-11-08 NOTE — Progress Notes (Signed)
PT Cancellation Note  Patient Details Name: TOREY CHESTNUT MRN: [id number] DOB: 07-08-1941   Cancelled Treatment:    Reason Eval/Treat Not Completed: Medical issues which prohibited therapy (having loose BM recently per nursing)   Claretha Cooper 11/08/2016, 11:47 AM Tresa Endo PT [phone]

## 2016-11-09 LAB — CBC WITH DIFFERENTIAL/PLATELET
Basophils Absolute: 0 10*3/uL (ref 0.0–0.1)
Basophils Relative: 0 %
Eosinophils Absolute: 0.1 10*3/uL (ref 0.0–0.7)
Eosinophils Relative: 1 %
HCT: 37.4 % (ref 36.0–46.0)
Hemoglobin: 13.1 g/dL (ref 12.0–15.0)
Lymphocytes Relative: 18 %
Lymphs Abs: 2 10*3/uL (ref 0.7–4.0)
MCH: 31.6 pg (ref 26.0–34.0)
MCHC: 35 g/dL (ref 30.0–36.0)
MCV: 90.1 fL (ref 78.0–100.0)
Monocytes Absolute: 1 10*3/uL (ref 0.1–1.0)
Monocytes Relative: 9 %
Neutro Abs: 8.4 10*3/uL — ABNORMAL HIGH (ref 1.7–7.7)
Neutrophils Relative %: 72 %
Platelets: 309 10*3/uL (ref 150–400)
RBC: 4.15 MIL/uL (ref 3.87–5.11)
RDW: 13 % (ref 11.5–15.5)
WBC: 11.5 10*3/uL — ABNORMAL HIGH (ref 4.0–10.5)

## 2016-11-09 LAB — COMPREHENSIVE METABOLIC PANEL
ALT: 18 U/L (ref 14–54)
AST: 17 U/L (ref 15–41)
Albumin: 3.1 g/dL — ABNORMAL LOW (ref 3.5–5.0)
Alkaline Phosphatase: 69 U/L (ref 38–126)
Anion gap: 9 (ref 5–15)
BUN: 23 mg/dL — ABNORMAL HIGH (ref 6–20)
CO2: 21 mmol/L — ABNORMAL LOW (ref 22–32)
Calcium: 8.4 mg/dL — ABNORMAL LOW (ref 8.9–10.3)
Chloride: 107 mmol/L (ref 101–111)
Creatinine, Ser: 0.82 mg/dL (ref 0.44–1.00)
GFR calc Af Amer: >60 mL/min (ref >60)
GFR calc non Af Amer: >60 mL/min (ref >60)
Glucose, Bld: 93 mg/dL (ref 65–99)
Potassium: 3 mmol/L — ABNORMAL LOW (ref 3.5–5.1)
Sodium: 137 mmol/L (ref 135–145)
Total Bilirubin: 0.4 mg/dL (ref 0.3–1.2)
Total Protein: 6.4 g/dL — ABNORMAL LOW (ref 6.5–8.1)

## 2016-11-09 LAB — MAGNESIUM: Magnesium: 2.2 mg/dL (ref 1.7–2.4)

## 2016-11-09 MED ORDER — POTASSIUM CHLORIDE CRYS ER 20 MEQ PO TBCR
40.0000 meq | EXTENDED_RELEASE_TABLET | Freq: Three times a day (TID) | ORAL | Status: AC
  Administered 2016-11-09 (×3): 40 meq via ORAL
  Filled 2016-11-09 (×3): qty 2

## 2016-11-09 NOTE — Evaluation (Signed)
Physical Therapy Evaluation Patient Details Name: Danielle Gaines MRN: [id number] DOB: 08-20-1941 Today's Date: 11/09/2016   History of Present Illness  Patient is a 75 year old female with history of arthritis, chronic diastolic CHF, GERD, IBS presented to ED 11/06/16 with altered mental status, generalized weakness.  She was seen in ED on 12/7and was treated for hypokalemia.  Admitted with fever, sepsis UTI. has chronic diarrhea  Clinical Impression  The patient is mldly impulsive, demonstrates decreased balance.   Pt admitted with above diagnosis. Pt currently with functional limitations due to the deficits listed below (see PT Problem List).  Pt will benefit from skilled PT to increase their independence and safety with mobility to allow discharge to the venue listed below.       Follow Up Recommendations Home health PT;SNF (depends if spouse present 24/7)    Equipment Recommendations  None recommended by PT    Recommendations for Other Services       Precautions / Restrictions Precautions Precautions: Fall Precaution Comments: diarrhea      Mobility  Bed Mobility Overal bed mobility: Needs Assistance Bed Mobility: Supine to Sit;Sit to Supine     Supine to sit: Supervision Sit to supine: Supervision   General bed mobility comments: moves self  Transfers Overall transfer level: Needs assistance Equipment used: Rolling walker (2 wheeled) Transfers: Sit to/from Stand Sit to Stand: Min assist         General transfer comment: steady assist, loss oof balance, tends to lean backward. multimodal cues for safety  Ambulation/Gait Ambulation/Gait assistance: Min assist;Mod assist Ambulation Distance (Feet): 25 Feet Assistive device: Rolling walker (2 wheeled) Gait Pattern/deviations: Step-through pattern;Staggering right;Staggering left     General Gait Details: multimodal cues for safety and to hold RW, tends to pick it up.  Stairs            Wheelchair  Mobility    Modified Rankin (Stroke Patients Only)       Balance Overall balance assessment: Needs assistance;History of Falls Sitting-balance support: Feet supported;No upper extremity supported Sitting balance-Leahy Scale: Fair     Standing balance support: During functional activity;No upper extremity supported Standing balance-Leahy Scale: Poor                               Pertinent Vitals/Pain Pain Assessment: Faces Faces Pain Scale: Hurts little more Pain Location: abdomen Pain Descriptors / Indicators: Cramping Pain Intervention(s): Monitored during session    Home Living Family/patient expects to be discharged to:: Private residence Living Arrangements: Spouse/significant other Available Help at Discharge: Family;Available PRN/intermittently Type of Home: House Home Access: Stairs to enter   [employer] of Steps: 3 Home Layout: Two level Home Equipment: Bedside commode      Prior Function Level of Independence: Independent               Hand Dominance        Extremity/Trunk Assessment   Upper Extremity Assessment: Generalized weakness           Lower Extremity Assessment: Generalized weakness      Cervical / Trunk Assessment: Kyphotic  Communication   Communication: No difficulties  Cognition Arousal/Alertness: Awake/alert Behavior During Therapy: WFL for tasks assessed/performed;Impulsive Overall Cognitive Status: Within Functional Limits for tasks assessed                      General Comments      Exercises  Assessment/Plan    PT Assessment Patient needs continued PT services  PT Problem List Decreased strength;Decreased activity tolerance;Decreased balance;Decreased mobility;Decreased safety awareness;Decreased knowledge of precautions          PT Treatment Interventions DME instruction;Gait training;Stair training;Functional mobility training;Therapeutic activities;Therapeutic  exercise;Patient/family education    PT Goals (Current goals can be found in the Care Plan section)  Acute Rehab PT Goals Patient Stated Goal: to walk more PT Goal Formulation: With patient Time For Goal Achievement: 11/23/16 Potential to Achieve Goals: Good    Frequency Min 3X/week   Barriers to discharge        Co-evaluation               End of Session   Activity Tolerance: Patient tolerated treatment well Patient left: in bed;with call bell/phone within reach;with bed alarm set;with family/visitor present Nurse Communication: Mobility status         Time: [id number] PT Time Calculation (min) (ACUTE ONLY): 14 min   Charges:   PT Evaluation $PT Eval Low Complexity: 1 Procedure     PT G CodesClaretha Cooper 11/09/2016, 4:02 PM Tresa Endo PT [phone]

## 2016-11-09 NOTE — Progress Notes (Signed)
PROGRESS NOTE    Danielle Gaines  [id number]  DOB: May 19, 1941  DOA: 11/06/2016 PCP: Lucretia Kern., DO  Hospital course: Patient is a 75 year old female with history of arthritis, chronic diastolic CHF, GERD, IBS presented to ED with altered mental status, generalized weakness. History was obtained from patient's husband at the bedside. She was seen in ED on 12/7, 3 days ago and was treated for hypokalemia. Per patient's husband, for last 1 week she has been feeling generalized weakness, has been unsteady on her feet however in the last 3 days she has been progressively getting worse. 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 This morning, patient was significantly worse, confused, febrile. Patient is unable to provide any history herself. Per husband, she had 2 episodes of vomiting, was complaining of lower abdominal pain. She had no coughing or URI symptoms. She had been complaining of myalgias. She had been significantly unsteady and weak that he had been helping her to walk in the house.  Assessment & Plan:     Sepsis secondary to UTI - Admitted to stepdown, now in medical room, blood cultures NGTD,  - Urine culture positive for klebsiella, sensitive to cipro. Started cipro 12/11.     GERD (gastroesophageal reflux disease) - Continue PPI therapy.     AKI (acute kidney injury) with metabolic acidosis - Creatinine on admission 1.67, baseline 1.2 on 12/7 - Continue IV fluid gentle hydration, Follow I/O.  - Acidosis improving with bicarbonate infusion, renal function improving with hydration, avoid nephrotoxins. - continue gentle IVFs today, likely can DC 12/12    Acute encephalopathy - resolved now, getting back to baseline - Likely due to #1, CT head normal, improving with supportive therapy,  PT eval pending.  Moderate Malnutrition - appreciate dietitian recommendations  Hypokalemia - slowly improving, continue oral replacement, Magnesium normal.   Follow.    Klebsiella UTI (urinary tract infection) - Treating with ciprofloxacin  Psych - resume home psychiatric medications that she takes at bedtime  - Pt reports that she only takes adderall once every 3 days so did not reorder  Chronic Constipation - Holding linzess as she had loose stool yesterday.  Probiotics ordered.  DVT prophylaxis: heparin sq  DISPO: likely can d/c home 12/13 with close outpatient follow up  CODE STATUS: full code  Subjective: Pt says she is feeling better, appetite still not very good.        Objective: Vitals:   11/08/16 0533 11/08/16 1400 11/08/16 2207 11/09/16 0500  BP: (!) 120/45 108/80 (!) 113/49 115/61  Pulse: 65 (!) 111 (!) 111 89  Resp: 16 18 18 18   Temp: 97.8 F (36.6 C) 98.5 F (36.9 C) 98 F (36.7 C) 98.1 F (36.7 C)  TempSrc: Oral Oral Oral Oral  SpO2: 94% 96% 96% 97%  Weight:      Height:        Intake/Output Summary (Last 24 hours) at 11/09/16 0916 Last data filed at 11/09/16 0600  Gross per 24 hour  Intake             3050 ml  Output             4700 ml  Net            -1650 ml   Filed Weights   11/06/16 1256 11/06/16 1600  Weight: 52.2 kg (115 lb) 53 kg (116 lb 13.5 oz)    Exam:  General: Alert, awake, oriented x self 3, in  no acute distress. HEENT: normocephalic, atraumatic, anicteric sclera, pink conjunctiva, pupils equal and reactive to light and accomodation, Neck: supple, no masses or lymphadenopathy, no goiter, no bruits, persistent dry mucous membranes Heart: Regular rate and rhythm, without murmurs, rubs or gallops. Lungs: Clear to auscultation bilaterally, no wheezing, rales or rhonchi. Abdomen: Soft, nontender, nondistended, positive bowel sounds, no masses. Extremities: No clubbing, cyanosis or edema with positive pedal pulses. Neuro: Grossly intact, no focal neurological deficits, strength 5/5 upper and lower extremities bilaterally Psych: alert and oriented x self, confused Skin: no rashes or  lesions, warm and dry  Data Reviewed: Basic Metabolic Panel:  Recent Labs Lab 11/04/16 1035 11/06/16 1251 11/06/16 1633 11/06/16 1825 11/06/16 2216 11/07/16 0323 11/08/16 0440 11/09/16 0441  NA 131* 138 139  --   --  139 136 137  K 2.6* 3.5 2.5*  --  2.9* 2.7* 2.5* 3.0*  CL 105 116* 118*  --   --  119* 111 107  CO2 14* 9* 9*  --   --  9* 15* 21*  GLUCOSE 117* 159* 136*  --   --  108* 86 93  BUN 29* 38* 36*  --   --  33* 28* 23*  CREATININE 1.27* 1.67* 1.41*  --   --  1.10* 0.86 0.82  CALCIUM 10.0 9.1 7.8*  --   --  7.7* 7.8* 8.4*  MG 2.7*  --   --  2.3  --  2.1  --  2.2   Liver Function Tests:  Recent Labs Lab 11/06/16 1251 11/06/16 1633 11/08/16 0440 11/09/16 0441  AST 30 29 24 17   ALT 30 28 21 18   ALKPHOS 86 70 65 69  BILITOT 0.9 0.5 0.7 0.4  PROT 8.1 7.2 6.2* 6.4*  ALBUMIN 4.1 3.4* 3.0* 3.1*   No results for input(s): LIPASE, AMYLASE in the last 168 hours. No results for input(s): AMMONIA in the last 168 hours. CBC:  Recent Labs Lab 11/04/16 1035 11/06/16 1251 11/07/16 0323 11/08/16 0440 11/09/16 0441  WBC 14.3* 19.8* 15.6* 12.8* 11.5*  NEUTROABS  --  19.2*  --  10.5* 8.4*  HGB 16.4* 16.7* 14.0 12.8 13.1  HCT 45.7 47.3* 40.3 35.6* 37.4  MCV 90.7 92.6 92.0 87.9 90.1  PLT 362 373 320 286 309   Cardiac Enzymes: No results for input(s): CKTOTAL, CKMB, CKMBINDEX, TROPONINI in the last 168 hours. CBG (last 3)  No results for input(s): GLUCAP in the last 72 hours. Recent Results (from the past 240 hour(s))  Urine culture     Status: Abnormal   Collection Time: 11/06/16 12:29 PM  Result Value Ref Range Status   Specimen Description URINE, CATHETERIZED  Final   Special Requests NONE  Final   Culture >=100,000 COLONIES/mL KLEBSIELLA PNEUMONIAE (A)  Final   Report Status 11/08/2016 FINAL  Final   Organism ID, Bacteria KLEBSIELLA PNEUMONIAE (A)  Final      Susceptibility   Klebsiella pneumoniae - MIC*    AMPICILLIN >=32 RESISTANT Resistant      CEFAZOLIN <=4 SENSITIVE Sensitive     CEFTRIAXONE <=1 SENSITIVE Sensitive     CIPROFLOXACIN <=0.25 SENSITIVE Sensitive     GENTAMICIN <=1 SENSITIVE Sensitive     IMIPENEM 0.5 SENSITIVE Sensitive     NITROFURANTOIN 64 INTERMEDIATE Intermediate     TRIMETH/SULFA <=20 SENSITIVE Sensitive     AMPICILLIN/SULBACTAM 8 SENSITIVE Sensitive     PIP/TAZO <=4 SENSITIVE Sensitive     Extended ESBL NEGATIVE Sensitive     * >=  100,000 COLONIES/mL KLEBSIELLA PNEUMONIAE  Culture, blood (Routine x 2)     Status: None (Preliminary result)   Collection Time: 11/06/16 12:48 PM  Result Value Ref Range Status   Specimen Description BLOOD RIGHT ARM  Final   Special Requests IN PEDIATRIC BOTTLE 3CC  Final   Culture   Final    NO GROWTH 2 DAYS Performed at [facility]    Report Status PENDING  Incomplete  Culture, blood (Routine x 2)     Status: None (Preliminary result)   Collection Time: 11/06/16 12:51 PM  Result Value Ref Range Status   Specimen Description BLOOD LEFT ARM  Final   Special Requests IN PEDIATRIC BOTTLE [place]  Final   Culture   Final    NO GROWTH 2 DAYS Performed at [facility]    Report Status PENDING  Incomplete  MRSA PCR Screening     Status: None   Collection Time: 11/06/16  3:50 PM  Result Value Ref Range Status   MRSA by PCR NEGATIVE NEGATIVE Final    Comment:        The GeneXpert MRSA Assay (FDA approved for NASAL specimens only), is one component of a comprehensive MRSA colonization surveillance program. It is not intended to diagnose MRSA infection nor to guide or monitor treatment for MRSA infections.   Culture, blood (x 2)     Status: None (Preliminary result)   Collection Time: 11/06/16  4:33 PM  Result Value Ref Range Status   Specimen Description BLOOD LEFT ANTECUBITAL  Final   Special Requests IN PEDIATRIC BOTTLE 0.5CC  Final   Culture   Final    NO GROWTH 2 DAYS Performed at [facility]    Report Status PENDING  Incomplete      Studies: No results found. Scheduled Meds: . chlorhexidine  15 mL Mouth Rinse BID  . ciprofloxacin  500 mg Oral BID  . cycloSPORINE  1 drop Both Eyes BID  . feeding supplement (ENSURE ENLIVE)  237 mL Oral BID BM  . heparin  5,000 Units Subcutaneous Q8H  . hyoscyamine  0.375 mg Oral BID  . lipase/protease/amylase  12,000 Units Oral TID WC  . mouth rinse  15 mL Mouth Rinse q12n4p  . OLANZapine-FLUoxetine  1 capsule Oral QHS  . pantoprazole  40 mg Oral Q0600  . potassium chloride  40 mEq Oral TID  . protriptyline  10 mg Oral QHS  . saccharomyces boulardii  250 mg Oral BID  . topiramate  200 mg Oral QHS   Continuous Infusions: .  sodium bicarbonate 150 mEq in sterile water 1000 mL infusion 100 mL/hr at 11/09/16 0002   Principal Problem:   Sepsis ([place]) Active Problems:   GERD (gastroesophageal reflux disease)   AKI (acute kidney injury) ([place])   Acute encephalopathy   UTI (urinary tract infection)   Malnutrition of moderate degree  Time spent: 26 mins  Irwin Brakeman, MD [facility] Pager [phone]  If 7PM-7AM, please contact night-coverage [email or web address] Password [email or web address] 11/09/2016, 9:16 AM    LOS: 3 days

## 2016-11-10 DIAGNOSIS — R319 Hematuria, unspecified: Secondary | ICD-10-CM

## 2016-11-10 DIAGNOSIS — E440 Moderate protein-calorie malnutrition: Secondary | ICD-10-CM

## 2016-11-10 DIAGNOSIS — A419 Sepsis, unspecified organism: Principal | ICD-10-CM

## 2016-11-10 DIAGNOSIS — N179 Acute kidney failure, unspecified: Secondary | ICD-10-CM

## 2016-11-10 DIAGNOSIS — N390 Urinary tract infection, site not specified: Secondary | ICD-10-CM

## 2016-11-10 LAB — CBC WITH DIFFERENTIAL/PLATELET
Basophils Absolute: 0 10*3/uL (ref 0.0–0.1)
Basophils Relative: 0 %
Eosinophils Absolute: 0.2 10*3/uL (ref 0.0–0.7)
Eosinophils Relative: 2 %
HCT: 37.2 % (ref 36.0–46.0)
Hemoglobin: 12.8 g/dL (ref 12.0–15.0)
Lymphocytes Relative: 29 %
Lymphs Abs: 2.6 10*3/uL (ref 0.7–4.0)
MCH: 31.7 pg (ref 26.0–34.0)
MCHC: 34.4 g/dL (ref 30.0–36.0)
MCV: 92.1 fL (ref 78.0–100.0)
Monocytes Absolute: 0.9 10*3/uL (ref 0.1–1.0)
Monocytes Relative: 10 %
Neutro Abs: 5.1 10*3/uL (ref 1.7–7.7)
Neutrophils Relative %: 59 %
Platelets: 287 10*3/uL (ref 150–400)
RBC: 4.04 MIL/uL (ref 3.87–5.11)
RDW: 12.8 % (ref 11.5–15.5)
WBC: 8.7 10*3/uL (ref 4.0–10.5)

## 2016-11-10 LAB — COMPREHENSIVE METABOLIC PANEL
ALT: 34 U/L (ref 14–54)
AST: 44 U/L — ABNORMAL HIGH (ref 15–41)
Albumin: 3 g/dL — ABNORMAL LOW (ref 3.5–5.0)
Alkaline Phosphatase: 75 U/L (ref 38–126)
Anion gap: 7 (ref 5–15)
BUN: 17 mg/dL (ref 6–20)
CO2: 31 mmol/L (ref 22–32)
Calcium: 8.6 mg/dL — ABNORMAL LOW (ref 8.9–10.3)
Chloride: 103 mmol/L (ref 101–111)
Creatinine, Ser: 0.83 mg/dL (ref 0.44–1.00)
GFR calc Af Amer: >60 mL/min (ref >60)
GFR calc non Af Amer: >60 mL/min (ref >60)
Glucose, Bld: 86 mg/dL (ref 65–99)
Potassium: 4.1 mmol/L (ref 3.5–5.1)
Sodium: 141 mmol/L (ref 135–145)
Total Bilirubin: 0.6 mg/dL (ref 0.3–1.2)
Total Protein: 6.2 g/dL — ABNORMAL LOW (ref 6.5–8.1)

## 2016-11-10 LAB — MAGNESIUM: Magnesium: 2.2 mg/dL (ref 1.7–2.4)

## 2016-11-10 MED ORDER — CIPROFLOXACIN HCL 500 MG PO TABS: 500.0000 mg | ORAL_TABLET | Freq: Two times a day (BID) | ORAL | 0 refills | Status: DC

## 2016-11-10 NOTE — Discharge Summary (Signed)
Physician Discharge Summary  Danielle Gaines [id number] DOB: [birth year] DOA: 11/06/2016  PCP: Lucretia Kern., DO  Admit date: 11/06/2016 Discharge date: 11/12/2016  Admitted From: Home Disposition: Home   Recommendations for Outpatient Follow-up:  1. Follow up with PCP in next 1-2 weeks.  2. Evaluate functional status. Disposition was to home with home health, though her deconditioning was such that SNF was considered and declined by patient and family. 3. Please obtain BMP to monitor hypokalemia (K 2.5 on admission to 4.1 on discharge), discharge creatinine 0.83. Consider CBC to monitor leukocytosis which has resolved at discharge.   Home Health: HH-PT w/supervision by husband.  Equipment/Devices: None Discharge Condition: Stable CODE STATUS: Full Diet recommendation: Regular  Brief/Interim Summary: Danielle Gaines is a 75 year old female with history of arthritis, chronic diastolic CHF, GERD, IBS presented to ED with altered mental status, generalized weakness. History was obtained from patient's husband at the bedside. She was seen in ED on 12/7, 3 days PTA and was treated for hypokalemia. Per patient's husband, for last 1 week she has been feeling generalized weakness, has been unsteady on her feet however in the last 3 days she has been progressively getting worse. 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 This morning, patient was significantly worse, confused, febrile. Patient is unable to provide any history herself. Per husband, she had 2 episodes of vomiting, was complaining of lower abdominal pain. She had no coughing or URI symptoms. She had been complaining of myalgias. She had been significantly unsteady and weak that he had been helping her to walk in the house. She was admitted to SDU for sepsis secondary to UTI which eventually grew Klebsiella. Cipro was started based on sensitivity data and fever curve improved. Leukocytosis resolved and she was  discharged on 12/13. Blood cultures remained negative.   Discharge Diagnoses:  Principal Problem:   Sepsis ([place]) Active Problems:   GERD (gastroesophageal reflux disease)   AKI (acute kidney injury) ([place])   Acute encephalopathy   UTI (urinary tract infection)   Malnutrition of moderate degree  Sepsis secondary to UTI - Admitted to stepdown, now in medical room, blood cultures NGTD,  - Urine culture positive for klebsiella, sensitive to cipro. Started cipro 12/11.   GERD (gastroesophageal reflux disease) - Continue PPI therapy.   AKI (acute kidney injury) with metabolic acidosis - Creatinine on admission 1.67, baseline actually ~0.8 based on Cr following IV rehydration. Was 1.2 on 12/7.  - Acidosis resolved after bicarbonate drip and remained resolved afterward.  - Continued IV fluid through 12/12, and was able to maintain hydration independently as evidenced by normal BMP.   Acute encephalopathy - resolved now, getting back to baseline - Likely due to #1, CT head normal, improving with supportive therapy  Moderate malnutrition - appreciate dietitian recommendations  Hypokalemia - resolved on  oral replacement, Magnesium normal.  Follow.   Klebsiella UTI (urinary tract infection) - Treating with ciprofloxacin  Psych - resume home psychiatric medications that she takes at bedtime  - Pt reports that she only takes adderall once every 3 days so did not reorder  Chronic Constipation - Holding linzess as she had loose stool yesterday.  Probiotics ordered.  Discharge Instructions Discharge Instructions    Call MD for:  difficulty breathing, headache or visual disturbances    Complete by:  As directed    Call MD for:  persistant nausea and vomiting    Complete by:  As directed    Call  MD for:  severe uncontrolled pain    Complete by:  As directed    Call MD for:  temperature >100.4    Complete by:  As directed    Diet - low sodium heart healthy    Complete by:   As directed    Discharge instructions    Complete by:  As directed    You were admitted for a severe urinary tract infection due to a bacterium called "Klebsiella pneumoniae." This is sensitive to the antibiotics you've been receiving and you are clear for discharge with the following recommendations:  - Continue taking cipro, the antibiotic, twice daily until you run out of medication (5 more days) - Schedule a follow up appointment with your PCP in the next week. If your symptoms return/worsen seek medical attention sooner.   Increase activity slowly    Complete by:  As directed      Allergies as of 11/10/2016      Reactions   Sulfonamide Derivatives Swelling      Medication List    TAKE these medications   acetaminophen 500 MG tablet Commonly known as:  TYLENOL Take 1,000 mg by mouth every 6 (six) hours as needed for moderate pain.   ADDERALL XR 30 MG 24 hr capsule Generic drug:  amphetamine-dextroamphetamine Take 30 mg by mouth every morning.   alendronate 70 MG tablet Commonly known as:  FOSAMAX take 1 tablet by mouth every week What changed:  See the new instructions.   BIOTIN PO Take 1 capsule by mouth daily.   ciprofloxacin 500 MG tablet Commonly known as:  CIPRO Take 1 tablet (500 mg total) by mouth 2 (two) times daily.   cycloSPORINE 0.05 % ophthalmic emulsion Commonly known as:  RESTASIS Place 1 drop into both eyes 2 (two) times daily.   hyoscyamine 0.375 MG 12 hr tablet Commonly known as:  LEVBID Take 0.375 mg by mouth 2 (two) times daily.   LINZESS 290 MCG Caps capsule Generic drug:  linaclotide Take 290 mcg by mouth daily before breakfast.   metaxalone 800 MG tablet Commonly known as:  SKELAXIN take 800mg  tablet by mouth three times a day if needed for pain   methocarbamol 500 MG tablet Commonly known as:  ROBAXIN Take 500 mg by mouth every 6 (six) hours as needed for muscle spasms.   OLANZapine-FLUoxetine 3-25 MG capsule Commonly known as:   SYMBYAX Take 1 capsule by mouth at bedtime.   omeprazole 40 MG capsule Commonly known as:  PRILOSEC Take 40 mg by mouth every morning.   potassium chloride 10 MEQ tablet Commonly known as:  K-DUR Take 1 tablet (10 mEq total) by mouth 2 (two) times daily.   protriptyline 10 MG tablet Commonly known as:  VIVACTIL Take 10 mg by mouth at bedtime.   SUMAtriptan 100 MG tablet Commonly known as:  IMITREX Take 100 mg by mouth every 2 (two) hours as needed for migraine or headache. May repeat in 2 hours if headache persists or recurs.   topiramate 100 MG tablet Commonly known as:  TOPAMAX Take 1 tablet (100 mg total) by mouth 2 (two) times daily. What changed:  how much to take  additional instructions   traZODone 50 MG tablet Commonly known as:  DESYREL Take 50 mg by mouth at bedtime.   ZENPEP 15000 units Cpep Generic drug:  Pancrelipase (Lip-Prot-Amyl) Take 45,000 mg by mouth 3 (three) times daily as needed for other. Before meals      Follow-up Information  Colin Benton R., DO. Schedule an appointment as soon as possible for a visit in 1 week(s).   Specialty:  Family Medicine Contact information: [address] [place] [address] [phone]          Allergies  Allergen Reactions  . Sulfonamide Derivatives Swelling    Consultations:  None  Procedures/Studies: Dg Chest 2 View  Result Date: 11/06/2016 CLINICAL DATA:  Fever. EXAM: CHEST  2 VIEW COMPARISON:  November 04, 2016 FINDINGS: The heart size and mediastinal contours are within normal limits. Both lungs are clear. The visualized skeletal structures are unremarkable. IMPRESSION: No active cardiopulmonary disease. Electronically Signed   By: Dorise Bullion III M.D   On: 11/06/2016 13:13   Dg Chest 2 View  Result Date: 11/04/2016 CLINICAL DATA:  Mid chest pain radiating into the back over the past several days. Status post fall a few days ago. EXAM: CHEST  2 VIEW COMPARISON:  PA and lateral  chest 06/13/2015. FINDINGS: Small focus of linear scar is seen in the anterior right upper lobe. Lungs are otherwise clear. Heart size is normal. No pneumothorax or pleural effusion. Heart size is normal. No acute bony abnormality. IMPRESSION: No acute disease. Electronically Signed   By: Inge Rise M.D.   On: 11/04/2016 10:30   Ct Head Wo Contrast  Result Date: 11/04/2016 CLINICAL DATA:  Fall 2 days ago.  Headache. EXAM: CT HEAD WITHOUT CONTRAST TECHNIQUE: Contiguous axial images were obtained from the base of the skull through the vertex without intravenous contrast. COMPARISON:  04/07/2015 FINDINGS: Brain: No acute intracranial abnormality. Specifically, no hemorrhage, hydrocephalus, mass lesion, acute infarction, or significant intracranial injury. Vascular: No hyperdense vessel or unexpected calcification. Skull: No acute calvarial abnormality Sinuses/Orbits: Visualized paranasal sinuses and mastoids clear. Orbital soft tissues unremarkable. Other: None IMPRESSION: Normal study. Electronically Signed   By: Rolm Baptise M.D.   On: 11/04/2016 13:17    Subjective: Pt feels improved, eating well, worked with PT who reported requiring assistance and SNF, though pt believes she can recuperate strength at home now that potassium has improved.   Discharge Exam: Vitals:   11/09/16 2100 11/10/16 0522  BP: 98/63 (!) 108/57  Pulse: 96 91  Resp: 16 18  Temp: 97.6 F (36.4 C) 98.2 F (36.8 C)   Vitals:   11/09/16 0500 11/09/16 1500 11/09/16 2100 11/10/16 0522  BP: 115/61 115/70 98/63 (!) 108/57  Pulse: 89 (!) 103 96 91  Resp: 18 18 16 18   Temp: 98.1 F (36.7 C) 97.6 F (36.4 C) 97.6 F (36.4 C) 98.2 F (36.8 C)  TempSrc: Oral Oral Oral Oral  SpO2: 97% 95% 97% 98%  Weight:      Height:       General: Pt is alert, awake, not in acute distress Cardiovascular: RRR, S1/S2 +, no rubs, no gallops Respiratory: CTA bilaterally, no wheezing, no rhonchi Abdominal: Soft, NT, ND, bowel sounds  + Extremities: no edema, no cyanosis  The results of significant diagnostics from this hospitalization (including imaging, microbiology, ancillary and laboratory) are listed below for reference.    Microbiology: Recent Results (from the past 240 hour(s))  Urine culture     Status: Abnormal   Collection Time: 11/06/16 12:29 PM  Result Value Ref Range Status   Specimen Description URINE, CATHETERIZED  Final   Special Requests NONE  Final   Culture >=100,000 COLONIES/mL KLEBSIELLA PNEUMONIAE (A)  Final   Report Status 11/08/2016 FINAL  Final   Organism ID, Bacteria KLEBSIELLA PNEUMONIAE (A)  Final      Susceptibility   Klebsiella pneumoniae - MIC*    AMPICILLIN >=32 RESISTANT Resistant     CEFAZOLIN <=4 SENSITIVE Sensitive     CEFTRIAXONE <=1 SENSITIVE Sensitive     CIPROFLOXACIN <=0.25 SENSITIVE Sensitive     GENTAMICIN <=1 SENSITIVE Sensitive     IMIPENEM 0.5 SENSITIVE Sensitive     NITROFURANTOIN 64 INTERMEDIATE Intermediate     TRIMETH/SULFA <=20 SENSITIVE Sensitive     AMPICILLIN/SULBACTAM 8 SENSITIVE Sensitive     PIP/TAZO <=4 SENSITIVE Sensitive     Extended ESBL NEGATIVE Sensitive     * >=100,000 COLONIES/mL KLEBSIELLA PNEUMONIAE  Culture, blood (Routine x 2)     Status: None   Collection Time: 11/06/16 12:48 PM  Result Value Ref Range Status   Specimen Description BLOOD RIGHT ARM  Final   Special Requests IN PEDIATRIC BOTTLE 3CC  Final   Culture   Final    NO GROWTH 5 DAYS Performed at [facility]    Report Status 11/11/2016 FINAL  Final  Culture, blood (Routine x 2)     Status: None   Collection Time: 11/06/16 12:51 PM  Result Value Ref Range Status   Specimen Description BLOOD LEFT ARM  Final   Special Requests IN PEDIATRIC BOTTLE [place]  Final   Culture   Final    NO GROWTH 5 DAYS Performed at [facility]    Report Status 11/11/2016 FINAL  Final  MRSA PCR Screening     Status: None   Collection Time: 11/06/16  3:50 PM  Result Value Ref Range  Status   MRSA by PCR NEGATIVE NEGATIVE Final    Comment:        The GeneXpert MRSA Assay (FDA approved for NASAL specimens only), is one component of a comprehensive MRSA colonization surveillance program. It is not intended to diagnose MRSA infection nor to guide or monitor treatment for MRSA infections.   Culture, blood (x 2)     Status: None   Collection Time: 11/06/16  4:33 PM  Result Value Ref Range Status   Specimen Description BLOOD LEFT ANTECUBITAL  Final   Special Requests IN PEDIATRIC BOTTLE 0.5CC  Final   Culture   Final    NO GROWTH 5 DAYS Performed at [facility]    Report Status 11/11/2016 FINAL  Final     Labs: BNP (last 3 results) No results for input(s): BNP in the last 8760 hours. Basic Metabolic Panel:  Recent Labs Lab 11/06/16 1633 11/06/16 1825 11/06/16 2216 11/07/16 0323 11/08/16 0440 11/09/16 0441 11/10/16 0510  NA 139  --   --  139 136 137 141  K 2.5*  --  2.9* 2.7* 2.5* 3.0* 4.1  CL 118*  --   --  119* 111 107 103  CO2 9*  --   --  9* 15* 21* 31  GLUCOSE 136*  --   --  108* 86 93 86  BUN 36*  --   --  33* 28* 23* 17  CREATININE 1.41*  --   --  1.10* 0.86 0.82 0.83  CALCIUM 7.8*  --   --  7.7* 7.8* 8.4* 8.6*  MG  --  2.3  --  2.1  --  2.2 2.2   Liver Function Tests:  Recent Labs Lab 11/06/16 1251 11/06/16 1633 11/08/16 0440 11/09/16 0441 11/10/16 0510  AST 30 29 24 17  44*  ALT 30 28 21 18  34  ALKPHOS 86 70 65 69  75  BILITOT 0.9 0.5 0.7 0.4 0.6  PROT 8.1 7.2 6.2* 6.4* 6.2*  ALBUMIN 4.1 3.4* 3.0* 3.1* 3.0*   No results for input(s): LIPASE, AMYLASE in the last 168 hours. No results for input(s): AMMONIA in the last 168 hours. CBC:  Recent Labs Lab 11/06/16 1251 11/07/16 0323 11/08/16 0440 11/09/16 0441 11/10/16 0510  WBC 19.8* 15.6* 12.8* 11.5* 8.7  NEUTROABS 19.2*  --  10.5* 8.4* 5.1  HGB 16.7* 14.0 12.8 13.1 12.8  HCT 47.3* 40.3 35.6* 37.4 37.2  MCV 92.6 92.0 87.9 90.1 92.1  PLT 373 320 286 309 287    Cardiac Enzymes: No results for input(s): CKTOTAL, CKMB, CKMBINDEX, TROPONINI in the last 168 hours. BNP: Invalid input(s): POCBNP CBG: No results for input(s): GLUCAP in the last 168 hours. D-Dimer No results for input(s): DDIMER in the last 72 hours. Hgb A1c No results for input(s): HGBA1C in the last 72 hours. Lipid Profile No results for input(s): CHOL, HDL, LDLCALC, TRIG, CHOLHDL, LDLDIRECT in the last 72 hours. Thyroid function studies No results for input(s): TSH, T4TOTAL, T3FREE, THYROIDAB in the last 72 hours.  Invalid input(s): FREET3 Anemia work up No results for input(s): VITAMINB12, FOLATE, FERRITIN, TIBC, IRON, RETICCTPCT in the last 72 hours. Urinalysis    Component Value Date/Time   COLORURINE YELLOW 11/06/2016 1229   APPEARANCEUR CLOUDY (A) 11/06/2016 1229   LABSPEC 1.011 11/06/2016 1229   PHURINE 5.0 11/06/2016 1229   GLUCOSEU NEGATIVE 11/06/2016 1229   GLUCOSEU NEGATIVE 09/17/2016 1131   HGBUR MODERATE (A) 11/06/2016 1229   BILIRUBINUR NEGATIVE 11/06/2016 1229   BILIRUBINUR negative 09/17/2016 1113   KETONESUR NEGATIVE 11/06/2016 1229   PROTEINUR 100 (A) 11/06/2016 1229   UROBILINOGEN 0.2 09/17/2016 1131   NITRITE NEGATIVE 11/06/2016 1229   LEUKOCYTESUR LARGE (A) 11/06/2016 1229   Sepsis Labs Invalid input(s): PROCALCITONIN,  WBC,  LACTICIDVEN Microbiology Recent Results (from the past 240 hour(s))  Urine culture     Status: Abnormal   Collection Time: 11/06/16 12:29 PM  Result Value Ref Range Status   Specimen Description URINE, CATHETERIZED  Final   Special Requests NONE  Final   Culture >=100,000 COLONIES/mL KLEBSIELLA PNEUMONIAE (A)  Final   Report Status 11/08/2016 FINAL  Final   Organism ID, Bacteria KLEBSIELLA PNEUMONIAE (A)  Final      Susceptibility   Klebsiella pneumoniae - MIC*    AMPICILLIN >=32 RESISTANT Resistant     CEFAZOLIN <=4 SENSITIVE Sensitive     CEFTRIAXONE <=1 SENSITIVE Sensitive     CIPROFLOXACIN <=0.25 SENSITIVE  Sensitive     GENTAMICIN <=1 SENSITIVE Sensitive     IMIPENEM 0.5 SENSITIVE Sensitive     NITROFURANTOIN 64 INTERMEDIATE Intermediate     TRIMETH/SULFA <=20 SENSITIVE Sensitive     AMPICILLIN/SULBACTAM 8 SENSITIVE Sensitive     PIP/TAZO <=4 SENSITIVE Sensitive     Extended ESBL NEGATIVE Sensitive     * >=100,000 COLONIES/mL KLEBSIELLA PNEUMONIAE  Culture, blood (Routine x 2)     Status: None   Collection Time: 11/06/16 12:48 PM  Result Value Ref Range Status   Specimen Description BLOOD RIGHT ARM  Final   Special Requests IN PEDIATRIC BOTTLE 3CC  Final   Culture   Final    NO GROWTH 5 DAYS Performed at [facility]    Report Status 11/11/2016 FINAL  Final  Culture, blood (Routine x 2)     Status: None   Collection Time: 11/06/16 12:51 PM  Result Value Ref Range Status  Specimen Description BLOOD LEFT ARM  Final   Special Requests IN PEDIATRIC BOTTLE [place]  Final   Culture   Final    NO GROWTH 5 DAYS Performed at [facility]    Report Status 11/11/2016 FINAL  Final  MRSA PCR Screening     Status: None   Collection Time: 11/06/16  3:50 PM  Result Value Ref Range Status   MRSA by PCR NEGATIVE NEGATIVE Final    Comment:        The GeneXpert MRSA Assay (FDA approved for NASAL specimens only), is one component of a comprehensive MRSA colonization surveillance program. It is not intended to diagnose MRSA infection nor to guide or monitor treatment for MRSA infections.   Culture, blood (x 2)     Status: None   Collection Time: 11/06/16  4:33 PM  Result Value Ref Range Status   Specimen Description BLOOD LEFT ANTECUBITAL  Final   Special Requests IN PEDIATRIC BOTTLE 0.5CC  Final   Culture   Final    NO GROWTH 5 DAYS Performed at [facility]    Report Status 11/11/2016 FINAL  Final    Time coordinating discharge: Over 6 minutes  Vance Gather, MD  [facility] 11/12/2016, 3:55 PM Pager [phone]  If 7PM-7AM, please contact  night-coverage [email or web address] Password [email or web address]

## 2016-11-10 NOTE — Progress Notes (Signed)
Physical Therapy Treatment Patient Details Name: Danielle Gaines MRN: [id number] DOB: May 17, 1941 Today's Date: 11/10/2016    History of Present Illness Patient is a 75 year old female with history of arthritis, chronic diastolic CHF, GERD, IBS presented to ED 11/06/16 with altered mental status, generalized weakness.  She was seen in ED on 12/7and was treated for hypokalemia.  Admitted with fever, sepsis UTI. has chronic diarrhea    PT Comments    The patient's gait is unsteady without some support. Encouraged patient to use RW and have assistance to go up/down step/ Note that patient and spouse are declining PT at present.  Follow Up Recommendations  Home health PT     Equipment Recommendations  None recommended by PT    Recommendations for Other Services       Precautions / Restrictions Precautions Precautions: Fall Precaution Comments: diarrhea    Mobility  Bed Mobility Overal bed mobility: Independent Bed Mobility: Supine to Sit              Transfers   Equipment used: 1 person hand held assist Transfers: Sit to/from Stand Sit to Stand: Supervision            Ambulation/Gait Ambulation/Gait assistance: Min guard;Min assist Ambulation Distance (Feet): 400 Feet Assistive device: 1 person hand held assist Gait Pattern/deviations: Step-through pattern;Staggering left;Staggering right     General Gait Details: gait is unsteady without a little support. She reports walking to BR and holding nto things.    Stairs            Wheelchair Mobility    Modified Rankin (Stroke Patients Only)       Balance Overall balance assessment: Needs assistance;History of Falls         Standing balance support: During functional activity Standing balance-Leahy Scale: Poor Standing balance comment: needs UE support                    Cognition Arousal/Alertness: Awake/alert                          Exercises      General Comments        Pertinent Vitals/Pain Faces Pain Scale: Hurts little more Pain Location: abdomen Pain Descriptors / Indicators: Cramping    Home Living                      Prior Function            PT Goals (current goals can now be found in the care plan section) Progress towards PT goals: Progressing toward goals    Frequency    Min 3X/week      PT Plan Current plan remains appropriate    Co-evaluation             End of Session   Activity Tolerance: Patient tolerated treatment well Patient left: in bed;with call bell/phone within reach     Time: [id number] PT Time Calculation (min) (ACUTE ONLY): 23 min  Charges:  $Gait Training: 23-37 mins                    G Codes:      Claretha Cooper 11/10/2016, 5:05 PM

## 2016-11-10 NOTE — Progress Notes (Signed)
Spoke with pt's husband concerning HH. Mr. Peikert states, he do not know how she(pt) will be when she gets home and can not answer that. Encourage Mr. Edmister to call her(pt's) PCP if there is a need for [facility] after discharge.

## 2016-11-10 NOTE — Progress Notes (Signed)
Patient is alert and oriented x 4 and ambulatory with 1 assist. HH options denied by spouse. See CM note.Discharged instructions reviewed with pt and spouse. Questions, concerns denied.

## 2016-11-10 NOTE — Plan of Care (Signed)
Problem: Nutrition: Goal: Adequate nutrition will be maintained Outcome: Progressing Patient has attempted meals, intake of regular foods poor. Pt tolerated ensure.

## 2016-11-11 ENCOUNTER — Ambulatory Visit: Payer: 59

## 2016-11-11 ENCOUNTER — Telehealth: Payer: Self-pay

## 2016-11-11 ENCOUNTER — Other Ambulatory Visit: Payer: Self-pay | Admitting: Family Medicine

## 2016-11-11 LAB — CULTURE, BLOOD (ROUTINE X 2)
Culture: NO GROWTH
Culture: NO GROWTH
Culture: NO GROWTH

## 2016-11-11 MED ORDER — ONDANSETRON HCL 4 MG PO TABS: 4.0000 mg | ORAL_TABLET | Freq: Three times a day (TID) | ORAL | 0 refills | Status: DC | PRN

## 2016-11-11 NOTE — Telephone Encounter (Signed)
Spoke with husband, pt was sleeping, he will have her call back.

## 2016-11-11 NOTE — Progress Notes (Signed)
Pt called reporting nausea. She is able to take po and does not report emesis, requests prescription.   Called in zofran 4mg  po q8h prn N/V #20, refill 0 to her [employer]. She will be alerted when the prescription is filled.   Vance Gather, MD 11/11/2016 12:52 PM

## 2016-11-15 NOTE — Telephone Encounter (Signed)
Spoke with pt's husband to check on her. He states that she is refusing to go to ED for evaluation. He states that she has eaten a sandwich and has drank some water and ginger ale. He states that he will give her through the night and see how she is doing tomorrow. Advised him that if her condition worsens to call EMS. He voiced understanding. Nothing further needed at this time.

## 2016-11-15 NOTE — Telephone Encounter (Signed)
Pt's husband called this morning. He states that pt is very weak, nauseated and not eating. He states that she does not seem to have a fever. She has been taking all medications, including abx, as directed. He is very concerned that she is declining quickly as she cannot walk 10 feet without being exhausted. He wanted advice on taking her to the ED. I offered appt here with Dr Maudie Mercury, but husband does not think he can get her in the car to bring her. He states that the last time she went to the ED he had to call EMS for transport as he could not get her there. I advised that if that was the case then calling EMS for transport is likely the best option so that there is no risk of injury to himself or the pt. He agreed. He has not given pt her medication this morning, I advised that if he does so before taking her to ED to make sure they are aware of what she has already taken. He states that he will call EMS for transport this morning. Advised him I would send message to Dr Maudie Mercury as Juluis Rainier.   Dr Maudie Mercury - Juluis Rainier. Will monitor pt's chart today for possible re-admission. Thanks!

## 2016-11-16 NOTE — Telephone Encounter (Signed)
LMTCB

## 2016-11-17 NOTE — Telephone Encounter (Signed)
Spoke with pt's husband. He cancelled appt with Dr Maudie Mercury for tomorrow. He states that pt is concerned she will need to be admitted and she does not want to spend Christmas in the hospital. He states that pt is eating and drinking. She is urinating twice daily. She is still unable to walk without assistance. He feels sure that he can manage her care at home until she can come in to be seen. Advised that if he notices pt stops urinating or her condition worsens to seek emergency care, via EMS if necessary. Husband agreed and voiced understanding. Appt rescheduled for 12/29 with Dr. Maudie Mercury. This will not qualify for TCM as it will be past the 2 week timeframe. Nothing further needed at this time.

## 2016-11-17 NOTE — Telephone Encounter (Signed)
Not sure I understand this message - if too sick to come in for her hospital follow up then I do advise that she should go back to the hospital for evaluation and treatment. I am worried about her.

## 2016-11-18 ENCOUNTER — Ambulatory Visit: Payer: 59 | Admitting: Family Medicine

## 2016-11-18 NOTE — Telephone Encounter (Signed)
LMTCB on home and cell phones

## 2016-11-18 NOTE — Telephone Encounter (Signed)
LMTCB

## 2016-11-18 NOTE — Telephone Encounter (Signed)
Spoke with pt's husband and he states that pt is not improving. Pt is still refusing to go to ED as she "does not want to be in the hospital over Christmas" per husband. Explained to husband that IF pt is still septic she needs immediate medical treatment and intervention. He states that pt cannot walk more than 71ft without laying down, she has to walk with assistance, she is requiring assistance with all ADL's. After much discussion he finally agreed to bring pt to the office to see Dr Maudie Mercury tomorrow. Appt scheduled with Dr Maudie Mercury 11/19/16 @ 2pm. Husband states that he will have assistance to get her in the car. Advised that when he gets here we can help get her into the office. Also reminded him that if pt's condition worsens he needs to call EMS immediately. He voiced understanding. Dr Maudie Mercury advised. Nothing further needed at this time.

## 2016-11-19 ENCOUNTER — Encounter (HOSPITAL_COMMUNITY): Payer: Self-pay | Admitting: Emergency Medicine

## 2016-11-19 ENCOUNTER — Ambulatory Visit (INDEPENDENT_AMBULATORY_CARE_PROVIDER_SITE_OTHER): Payer: 59 | Admitting: Family Medicine

## 2016-11-19 ENCOUNTER — Emergency Department (HOSPITAL_COMMUNITY): Payer: 59

## 2016-11-19 ENCOUNTER — Encounter: Payer: Self-pay | Admitting: Family Medicine

## 2016-11-19 ENCOUNTER — Inpatient Hospital Stay (HOSPITAL_COMMUNITY)
Admission: EM | Admit: 2016-11-19 | Discharge: 2016-11-23 | DRG: 682 | Disposition: A | Discharge status: Home / Self Care | Payer: 59 | Attending: Family Medicine | Admitting: Family Medicine

## 2016-11-19 VITALS — BP 80/50 | HR 101 | Temp 97.3°F | Ht 62.0 in

## 2016-11-19 DIAGNOSIS — M810 Age-related osteoporosis without current pathological fracture: Secondary | ICD-10-CM | POA: Diagnosis present

## 2016-11-19 DIAGNOSIS — G8929 Other chronic pain: Secondary | ICD-10-CM | POA: Insufficient documentation

## 2016-11-19 DIAGNOSIS — Z8673 Personal history of transient ischemic attack (TIA), and cerebral infarction without residual deficits: Secondary | ICD-10-CM | POA: Diagnosis not present

## 2016-11-19 DIAGNOSIS — Z841 Family history of disorders of kidney and ureter: Secondary | ICD-10-CM

## 2016-11-19 DIAGNOSIS — E871 Hypo-osmolality and hyponatremia: Secondary | ICD-10-CM | POA: Diagnosis not present

## 2016-11-19 DIAGNOSIS — K589 Irritable bowel syndrome without diarrhea: Secondary | ICD-10-CM | POA: Diagnosis present

## 2016-11-19 DIAGNOSIS — Z8249 Family history of ischemic heart disease and other diseases of the circulatory system: Secondary | ICD-10-CM

## 2016-11-19 DIAGNOSIS — F418 Other specified anxiety disorders: Secondary | ICD-10-CM | POA: Diagnosis present

## 2016-11-19 DIAGNOSIS — R531 Weakness: Secondary | ICD-10-CM | POA: Diagnosis not present

## 2016-11-19 DIAGNOSIS — R000 Tachycardia, unspecified: Secondary | ICD-10-CM | POA: Diagnosis not present

## 2016-11-19 DIAGNOSIS — I959 Hypotension, unspecified: Secondary | ICD-10-CM | POA: Diagnosis not present

## 2016-11-19 DIAGNOSIS — G9349 Other encephalopathy: Secondary | ICD-10-CM | POA: Diagnosis present

## 2016-11-19 DIAGNOSIS — F32A Depression, unspecified: Secondary | ICD-10-CM | POA: Diagnosis present

## 2016-11-19 DIAGNOSIS — Z9181 History of falling: Secondary | ICD-10-CM | POA: Diagnosis not present

## 2016-11-19 DIAGNOSIS — Z823 Family history of stroke: Secondary | ICD-10-CM

## 2016-11-19 DIAGNOSIS — E872 Acidosis: Secondary | ICD-10-CM | POA: Diagnosis present

## 2016-11-19 DIAGNOSIS — Z79899 Other long term (current) drug therapy: Secondary | ICD-10-CM

## 2016-11-19 DIAGNOSIS — R079 Chest pain, unspecified: Secondary | ICD-10-CM | POA: Diagnosis not present

## 2016-11-19 DIAGNOSIS — E440 Moderate protein-calorie malnutrition: Secondary | ICD-10-CM | POA: Diagnosis present

## 2016-11-19 DIAGNOSIS — R627 Adult failure to thrive: Secondary | ICD-10-CM | POA: Diagnosis present

## 2016-11-19 DIAGNOSIS — Z6821 Body mass index (BMI) 21.0-21.9, adult: Secondary | ICD-10-CM

## 2016-11-19 DIAGNOSIS — G43909 Migraine, unspecified, not intractable, without status migrainosus: Secondary | ICD-10-CM | POA: Diagnosis present

## 2016-11-19 DIAGNOSIS — Z7983 Long term (current) use of bisphosphonates: Secondary | ICD-10-CM

## 2016-11-19 DIAGNOSIS — F329 Major depressive disorder, single episode, unspecified: Secondary | ICD-10-CM | POA: Diagnosis present

## 2016-11-19 DIAGNOSIS — D751 Secondary polycythemia: Secondary | ICD-10-CM | POA: Diagnosis present

## 2016-11-19 DIAGNOSIS — E860 Dehydration: Secondary | ICD-10-CM | POA: Diagnosis present

## 2016-11-19 DIAGNOSIS — I951 Orthostatic hypotension: Secondary | ICD-10-CM | POA: Diagnosis present

## 2016-11-19 DIAGNOSIS — K219 Gastro-esophageal reflux disease without esophagitis: Secondary | ICD-10-CM | POA: Diagnosis not present

## 2016-11-19 DIAGNOSIS — A419 Sepsis, unspecified organism: Secondary | ICD-10-CM | POA: Diagnosis not present

## 2016-11-19 DIAGNOSIS — F332 Major depressive disorder, recurrent severe without psychotic features: Secondary | ICD-10-CM | POA: Diagnosis not present

## 2016-11-19 DIAGNOSIS — Z818 Family history of other mental and behavioral disorders: Secondary | ICD-10-CM

## 2016-11-19 DIAGNOSIS — F419 Anxiety disorder, unspecified: Secondary | ICD-10-CM | POA: Diagnosis present

## 2016-11-19 DIAGNOSIS — Z87440 Personal history of urinary (tract) infections: Secondary | ICD-10-CM

## 2016-11-19 DIAGNOSIS — H3530 Unspecified macular degeneration: Secondary | ICD-10-CM | POA: Diagnosis present

## 2016-11-19 DIAGNOSIS — N179 Acute kidney failure, unspecified: Secondary | ICD-10-CM | POA: Diagnosis present

## 2016-11-19 DIAGNOSIS — Z9049 Acquired absence of other specified parts of digestive tract: Secondary | ICD-10-CM

## 2016-11-19 DIAGNOSIS — Z882 Allergy status to sulfonamides status: Secondary | ICD-10-CM | POA: Diagnosis not present

## 2016-11-19 DIAGNOSIS — Z800 Family history of malignant neoplasm of digestive organs: Secondary | ICD-10-CM

## 2016-11-19 DIAGNOSIS — E876 Hypokalemia: Secondary | ICD-10-CM | POA: Diagnosis present

## 2016-11-19 DIAGNOSIS — F339 Major depressive disorder, recurrent, unspecified: Secondary | ICD-10-CM

## 2016-11-19 DIAGNOSIS — N289 Disorder of kidney and ureter, unspecified: Secondary | ICD-10-CM | POA: Diagnosis present

## 2016-11-19 DIAGNOSIS — Z960 Presence of urogenital implants: Secondary | ICD-10-CM | POA: Diagnosis present

## 2016-11-19 DIAGNOSIS — G9340 Encephalopathy, unspecified: Secondary | ICD-10-CM | POA: Diagnosis not present

## 2016-11-19 DIAGNOSIS — D72828 Other elevated white blood cell count: Secondary | ICD-10-CM | POA: Diagnosis present

## 2016-11-19 DIAGNOSIS — R197 Diarrhea, unspecified: Secondary | ICD-10-CM

## 2016-11-19 LAB — I-STAT TROPONIN, ED: Troponin i, poc: 0.01 ng/mL (ref 0.00–0.08)

## 2016-11-19 LAB — CBC
HCT: 41.2 % (ref 36.0–46.0)
Hemoglobin: 14.6 g/dL (ref 12.0–15.0)
MCH: 31.4 pg (ref 26.0–34.0)
MCHC: 35.4 g/dL (ref 30.0–36.0)
MCV: 88.6 fL (ref 78.0–100.0)
Platelets: 472 10*3/uL — ABNORMAL HIGH (ref 150–400)
RBC: 4.65 MIL/uL (ref 3.87–5.11)
RDW: 12.3 % (ref 11.5–15.5)
WBC: 14.9 10*3/uL — ABNORMAL HIGH (ref 4.0–10.5)

## 2016-11-19 LAB — URINALYSIS, ROUTINE W REFLEX MICROSCOPIC
Bilirubin Urine: NEGATIVE
Glucose, UA: NEGATIVE mg/dL
Hgb urine dipstick: NEGATIVE
Ketones, ur: NEGATIVE mg/dL
Leukocytes, UA: NEGATIVE
Nitrite: NEGATIVE
Protein, ur: NEGATIVE mg/dL
Specific Gravity, Urine: 1.005 (ref 1.005–1.030)
pH: 5 (ref 5.0–8.0)

## 2016-11-19 LAB — COMPREHENSIVE METABOLIC PANEL
ALT: 34 U/L (ref 14–54)
AST: 22 U/L (ref 15–41)
Albumin: 3.7 g/dL (ref 3.5–5.0)
Alkaline Phosphatase: 129 U/L — ABNORMAL HIGH (ref 38–126)
Anion gap: 11 (ref 5–15)
BUN: 69 mg/dL — ABNORMAL HIGH (ref 6–20)
CO2: 10 mmol/L — ABNORMAL LOW (ref 22–32)
Calcium: 8.6 mg/dL — ABNORMAL LOW (ref 8.9–10.3)
Chloride: 102 mmol/L (ref 101–111)
Creatinine, Ser: 2.68 mg/dL — ABNORMAL HIGH (ref 0.44–1.00)
GFR calc Af Amer: 19 mL/min — ABNORMAL LOW (ref >60)
GFR calc non Af Amer: 17 mL/min — ABNORMAL LOW (ref >60)
Glucose, Bld: 121 mg/dL — ABNORMAL HIGH (ref 65–99)
Potassium: 3.1 mmol/L — ABNORMAL LOW (ref 3.5–5.1)
Sodium: 123 mmol/L — ABNORMAL LOW (ref 135–145)
Total Bilirubin: 0.4 mg/dL (ref 0.3–1.2)
Total Protein: 7.6 g/dL (ref 6.5–8.1)

## 2016-11-19 LAB — CBC WITH DIFFERENTIAL/PLATELET
Basophils Absolute: 0 10*3/uL (ref 0.0–0.1)
Basophils Relative: 0 %
Eosinophils Absolute: 0 10*3/uL (ref 0.0–0.7)
Eosinophils Relative: 0 %
HCT: 40.9 % (ref 36.0–46.0)
Hemoglobin: 14.4 g/dL (ref 12.0–15.0)
Lymphocytes Relative: 3 %
Lymphs Abs: 0.6 10*3/uL — ABNORMAL LOW (ref 0.7–4.0)
MCH: 31.2 pg (ref 26.0–34.0)
MCHC: 35.2 g/dL (ref 30.0–36.0)
MCV: 88.5 fL (ref 78.0–100.0)
Monocytes Absolute: 1.6 10*3/uL — ABNORMAL HIGH (ref 0.1–1.0)
Monocytes Relative: 9 %
Neutro Abs: 16.6 10*3/uL — ABNORMAL HIGH (ref 1.7–7.7)
Neutrophils Relative %: 88 %
Platelets: 460 10*3/uL — ABNORMAL HIGH (ref 150–400)
RBC: 4.62 MIL/uL (ref 3.87–5.11)
RDW: 12.3 % (ref 11.5–15.5)
WBC: 18.9 10*3/uL — ABNORMAL HIGH (ref 4.0–10.5)

## 2016-11-19 LAB — BASIC METABOLIC PANEL
Anion gap: 12 (ref 5–15)
BUN: 56 mg/dL — ABNORMAL HIGH (ref 6–20)
CO2: 9 mmol/L — ABNORMAL LOW (ref 22–32)
Calcium: 8 mg/dL — ABNORMAL LOW (ref 8.9–10.3)
Chloride: 109 mmol/L (ref 101–111)
Creatinine, Ser: 1.97 mg/dL — ABNORMAL HIGH (ref 0.44–1.00)
GFR calc Af Amer: 28 mL/min — ABNORMAL LOW (ref >60)
GFR calc non Af Amer: 24 mL/min — ABNORMAL LOW (ref >60)
Glucose, Bld: 145 mg/dL — ABNORMAL HIGH (ref 65–99)
Potassium: 2.5 mmol/L — CL (ref 3.5–5.1)
Sodium: 130 mmol/L — ABNORMAL LOW (ref 135–145)

## 2016-11-19 LAB — I-STAT CHEM 8, ED
BUN: 61 mg/dL — ABNORMAL HIGH (ref 6–20)
Calcium, Ion: 1.12 mmol/L — ABNORMAL LOW (ref 1.15–1.40)
Chloride: 105 mmol/L (ref 101–111)
Creatinine, Ser: 2.7 mg/dL — ABNORMAL HIGH (ref 0.44–1.00)
Glucose, Bld: 117 mg/dL — ABNORMAL HIGH (ref 65–99)
HCT: 47 % — ABNORMAL HIGH (ref 36.0–46.0)
Hemoglobin: 16 g/dL — ABNORMAL HIGH (ref 12.0–15.0)
Potassium: 3.6 mmol/L (ref 3.5–5.1)
Sodium: 125 mmol/L — ABNORMAL LOW (ref 135–145)
TCO2: 9 mmol/L (ref 0–100)

## 2016-11-19 LAB — CREATININE, SERUM
Creatinine, Ser: 2.07 mg/dL — ABNORMAL HIGH (ref 0.44–1.00)
GFR calc Af Amer: 26 mL/min — ABNORMAL LOW (ref >60)
GFR calc non Af Amer: 23 mL/min — ABNORMAL LOW (ref >60)

## 2016-11-19 LAB — I-STAT CG4 LACTIC ACID, ED: Lactic Acid, Venous: 1.24 mmol/L (ref 0.5–1.9)

## 2016-11-19 LAB — BLOOD GAS, VENOUS
Acid-base deficit: 20.4 mmol/L — ABNORMAL HIGH (ref 0.0–2.0)
Bicarbonate: 7.9 mmol/L — ABNORMAL LOW (ref 20.0–28.0)
O2 Saturation: 86.8 %
Patient temperature: 98.6
pCO2, Ven: 24.4 mmHg — ABNORMAL LOW (ref 44.0–60.0)
pH, Ven: 7.136 — CL (ref 7.250–7.430)
pO2, Ven: 58.7 mmHg — ABNORMAL HIGH (ref 32.0–45.0)

## 2016-11-19 LAB — PROTIME-INR
INR: 1.07
Prothrombin Time: 13.9 seconds (ref 11.4–15.2)

## 2016-11-19 LAB — OSMOLALITY: Osmolality: 287 mOsm/kg (ref 275–295)

## 2016-11-19 LAB — NA AND K (SODIUM & POTASSIUM), RAND UR
Potassium Urine: 7 mmol/L
Sodium, Ur: 59 mmol/L

## 2016-11-19 LAB — OSMOLALITY, URINE: Osmolality, Ur: 234 mOsm/kg — ABNORMAL LOW (ref 300–900)

## 2016-11-19 LAB — MAGNESIUM: Magnesium: 2 mg/dL (ref 1.7–2.4)

## 2016-11-19 LAB — APTT: aPTT: 26 seconds (ref 24–36)

## 2016-11-19 MED ORDER — METAXALONE 800 MG PO TABS
800.0000 mg | ORAL_TABLET | Freq: Three times a day (TID) | ORAL | Status: DC | PRN
  Filled 2016-11-19: qty 1

## 2016-11-19 MED ORDER — TOPIRAMATE 100 MG PO TABS: 100.0000 mg | ORAL_TABLET | Freq: Two times a day (BID) | ORAL | Status: DC

## 2016-11-19 MED ORDER — CYCLOSPORINE 0.05 % OP EMUL
1.0000 [drp] | Freq: Two times a day (BID) | OPHTHALMIC | Status: DC
  Administered 2016-11-19 – 2016-11-23 (×8): 1 [drp] via OPHTHALMIC
  Filled 2016-11-19 (×9): qty 1

## 2016-11-19 MED ORDER — HYOSCYAMINE SULFATE ER 0.375 MG PO TB12
0.3750 mg | ORAL_TABLET | Freq: Two times a day (BID) | ORAL | Status: DC
  Administered 2016-11-19 – 2016-11-22 (×7): 0.375 mg via ORAL
  Filled 2016-11-19 (×8): qty 1

## 2016-11-19 MED ORDER — SODIUM CHLORIDE 0.9 % IV BOLUS (SEPSIS)
1000.0000 mL | Freq: Once | INTRAVENOUS | Status: AC
  Administered 2016-11-19: 1000 mL via INTRAVENOUS

## 2016-11-19 MED ORDER — PANCRELIPASE (LIP-PROT-AMYL) 12000-38000 UNITS PO CPEP
36000.0000 [IU] | ORAL_CAPSULE | Freq: Three times a day (TID) | ORAL | Status: DC
  Administered 2016-11-20 – 2016-11-23 (×10): 36000 [IU] via ORAL
  Filled 2016-11-19: qty 1
  Filled 2016-11-19 (×3): qty 3
  Filled 2016-11-19 (×3): qty 1
  Filled 2016-11-19: qty 3
  Filled 2016-11-19: qty 1
  Filled 2016-11-19 (×2): qty 3

## 2016-11-19 MED ORDER — LINACLOTIDE 290 MCG PO CAPS
290.0000 ug | ORAL_CAPSULE | Freq: Every day | ORAL | Status: DC
Start: 2016-11-20 — End: 2016-11-23
  Administered 2016-11-20 – 2016-11-23 (×4): 290 ug via ORAL
  Filled 2016-11-19 (×4): qty 1

## 2016-11-19 MED ORDER — ACETAMINOPHEN 500 MG PO TABS
1000.0000 mg | ORAL_TABLET | Freq: Four times a day (QID) | ORAL | Status: DC | PRN
  Administered 2016-11-20 – 2016-11-22 (×2): 1000 mg via ORAL
  Filled 2016-11-19 (×2): qty 2

## 2016-11-19 MED ORDER — PANTOPRAZOLE SODIUM 40 MG PO TBEC
80.0000 mg | DELAYED_RELEASE_TABLET | Freq: Every day | ORAL | Status: DC
  Administered 2016-11-20 – 2016-11-23 (×4): 80 mg via ORAL
  Filled 2016-11-19 (×4): qty 2

## 2016-11-19 MED ORDER — TOPIRAMATE 100 MG PO TABS
200.0000 mg | ORAL_TABLET | Freq: Every day | ORAL | Status: DC
  Administered 2016-11-19: 200 mg via ORAL
  Filled 2016-11-19: qty 2

## 2016-11-19 MED ORDER — ONDANSETRON HCL 4 MG PO TABS: 4.0000 mg | ORAL_TABLET | Freq: Three times a day (TID) | ORAL | Status: DC | PRN

## 2016-11-19 MED ORDER — SODIUM CHLORIDE 0.9 % IV SOLN: INTRAVENOUS | Status: DC

## 2016-11-19 MED ORDER — POTASSIUM CHLORIDE 10 MEQ/100ML IV SOLN
10.0000 meq | INTRAVENOUS | Status: AC
  Administered 2016-11-19 – 2016-11-20 (×3): 10 meq via INTRAVENOUS
  Filled 2016-11-19 (×3): qty 100

## 2016-11-19 MED ORDER — PROTRIPTYLINE HCL 10 MG PO TABS
20.0000 mg | ORAL_TABLET | Freq: Three times a day (TID) | ORAL | Status: DC
  Administered 2016-11-20 (×2): 20 mg via ORAL
  Filled 2016-11-19: qty 2

## 2016-11-19 MED ORDER — POTASSIUM CHLORIDE 2 MEQ/ML IV SOLN: 30.0000 meq | Freq: Once | INTRAVENOUS | Status: DC

## 2016-11-19 MED ORDER — TOPIRAMATE 100 MG PO TABS: 100.0000 mg | ORAL_TABLET | Freq: Every morning | ORAL | Status: DC

## 2016-11-19 MED ORDER — ENSURE ENLIVE PO LIQD
237.0000 mL | Freq: Two times a day (BID) | ORAL | Status: DC
  Administered 2016-11-20 – 2016-11-22 (×2): 237 mL via ORAL

## 2016-11-19 MED ORDER — HEPARIN SODIUM (PORCINE) 5000 UNIT/ML IJ SOLN
5000.0000 [IU] | Freq: Three times a day (TID) | INTRAMUSCULAR | Status: DC
  Administered 2016-11-19 – 2016-11-22 (×10): 5000 [IU] via SUBCUTANEOUS
  Filled 2016-11-19 (×10): qty 1

## 2016-11-19 MED ORDER — SUMATRIPTAN SUCCINATE 100 MG PO TABS
100.0000 mg | ORAL_TABLET | ORAL | Status: DC | PRN
  Filled 2016-11-19: qty 1

## 2016-11-19 MED ORDER — TRAZODONE HCL 50 MG PO TABS
50.0000 mg | ORAL_TABLET | Freq: Every day | ORAL | Status: DC
  Administered 2016-11-19: 50 mg via ORAL
  Filled 2016-11-19: qty 1

## 2016-11-19 MED ORDER — OLANZAPINE-FLUOXETINE HCL 3-25 MG PO CAPS
1.0000 | ORAL_CAPSULE | Freq: Every day | ORAL | Status: DC
Start: 2016-11-19 — End: 2016-11-23
  Administered 2016-11-19 – 2016-11-22 (×4): 1 via ORAL
  Filled 2016-11-19 (×4): qty 1

## 2016-11-19 MED ORDER — SODIUM CHLORIDE 0.9 % IV SOLN
Freq: Once | INTRAVENOUS | Status: AC
  Administered 2016-11-19: 18:00:00 via INTRAVENOUS

## 2016-11-19 MED ORDER — STERILE WATER FOR INJECTION IV SOLN
INTRAVENOUS | Status: DC
  Administered 2016-11-19 – 2016-11-22 (×5): via INTRAVENOUS
  Filled 2016-11-19 (×10): qty 850

## 2016-11-19 MED ORDER — SODIUM CHLORIDE 0.9% FLUSH
3.0000 mL | Freq: Two times a day (BID) | INTRAVENOUS | Status: DC
  Administered 2016-11-19 – 2016-11-23 (×5): 3 mL via INTRAVENOUS

## 2016-11-19 MED ORDER — POTASSIUM CHLORIDE CRYS ER 20 MEQ PO TBCR
40.0000 meq | EXTENDED_RELEASE_TABLET | Freq: Once | ORAL | Status: AC
  Administered 2016-11-19: 40 meq via ORAL
  Filled 2016-11-19: qty 2

## 2016-11-19 NOTE — Progress Notes (Signed)
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 See transitional care call. Hospitalized 12/09-12/15/2017 Per discharge summary: Admitted with symptoms of altered mental status, generalized weakness Principal problems included hypokalemia and sepsis secondary to [facility] UTI with leukocytosis to 19 >> resolved on discharge She was treated with antibiotics, blood cultures were negative, urine sensitive to Cipro She required IV rehydration, acidosis resolved with fluids Per discharge documents felt that she had acute encephalopathy, resolved prior to discharge, CT head was normal She was found to have moderate malnutrition, dietitian was seen with recommendations On oral treatment for the hypokalemia Per notes she was supposed to go to SNF or do HH but they declined both Husband reports: not doing better, getting worse, fell today from weakness when tried to walk, worsening SOB, AMS (saying things that don't make sense), abd pain, diarrhea (though improved from baseline), pt reports head feels "full of sounds", "I don't know what these sounds in my head are" She denies:melena, worsening diarrhea, head injury when fell, fevers, vomiting, dysuria, cp, palpitations. HM: due for pneumonia vaccine and colon ca screening  ROS: See pertinent positives and negatives per HPI.  Past Medical History:  Diagnosis Date  . Arthritis   . Benzodiazepine dependence ([place])   . Carotid arterial disease (HCC)    mild  . Chronic pain    managed by pain clinic  . Colon abnormality    didn't work right so part of it was removed at [facility]  . Depression    managed by Dr. Toy Care   . Diastolic heart failure ([place])    mild on echo 2016  . GERD (gastroesophageal reflux disease)   . IBS (irritable bowel syndrome)    sees Dr. Earlean Shawl  . Macular degeneration    go[facility] for this  . Migraine    managed by neurologist  . Osteoporosis     Past Surgical History:  Procedure Laterality Date  . ABDOMINAL ADHESION SURGERY    . ABDOMINAL HYSTERECTOMY    . APPENDECTOMY    . CHOLECYSTECTOMY    . COLON SURGERY    . CYSTOSCOPY W/ URETERAL STENT PLACEMENT Right 04/27/2016   Procedure: CYSTOSCOPY WITH RETROGRADE PYELOGRAM/ RIGHT URETERAL STENT PLACEMENT;  Surgeon: Ardis Hughs, MD;  Location: [facility];  Service: Urology;  Laterality: Right;  . OVARIAN CYST SURGERY    . SMALL INTESTINE SURGERY      Family History  Problem Relation Age of Onset  . Kidney disease Mother   . Heart disease Mother   . Cancer Maternal Aunt     intestinal and liver cancer  . Stroke Maternal Grandmother   . Stroke    . Depression      everyone  . Hypercalcemia Neg Hx     Social History   Social History  . Marital status: Married    Spouse name: N/A  . Number of children: N/A  . Years of education: N/A   Social History Main Topics  . Smoking status: Never Smoker  . Smokeless tobacco: Never Used  . Alcohol use No  . Drug use: No  . Sexual activity: No   Other Topics Concern  . None   Social History Narrative   Work or School:  none      Home Situation: lives with husband      Spiritual Beliefs:       Lifestyle: no regular exercise; diet is ok             No current facility-administered medications for this visit.  No current outpatient prescriptions on file.  Facility-Administered Medications Ordered in Other Visits:  .  acetaminophen (TYLENOL) tablet 1,000 mg, 1,000 mg, Oral, Q6H PRN, Patrecia Pour, MD, 1,000 mg at 11/20/16 1253 .  cycloSPORINE (RESTASIS) 0.05 % ophthalmic emulsion 1 drop, 1 drop, Both Eyes, BID, Patrecia Pour, MD, 1 drop at 11/21/16  0944 .  feeding supplement (ENSURE ENLIVE) (ENSURE ENLIVE) liquid 237 mL, 237 mL, Oral, BID BM, Patrecia Pour, MD, 237 mL at 11/20/16 1000 .  heparin injection 5,000 Units, 5,000 Units, Subcutaneous, Q8H, Patrecia Pour, MD, 5,000 Units at 11/21/16 0501 .  hyoscyamine (LEVBID) 0.375 MG 12 hr tablet 0.375 mg, 0.375 mg, Oral, BID, Patrecia Pour, MD, 0.375 mg at 11/21/16 0944 .  linaclotide (LINZESS) capsule 290 mcg, 290 mcg, Oral, QAC breakfast, Patrecia Pour, MD, 290 mcg at 11/21/16 [phone] .  lipase/protease/amylase (CREON) capsule 36,000 Units, 36,000 Units, Oral, TID AC, Patrecia Pour, MD, 36,000 Units at 11/21/16 1201 .  OLANZapine-FLUoxetine (SYMBYAX) 3-25 MG per capsule 1 capsule, 1 capsule, Oral, QHS, Patrecia Pour, MD, 1 capsule at 11/20/16 2200 .  ondansetron (ZOFRAN) tablet 4 mg, 4 mg, Oral, Q8H PRN, Patrecia Pour, MD .  pantoprazole (PROTONIX) EC tablet 80 mg, 80 mg, Oral, Daily, Patrecia Pour, MD, 80 mg at 11/21/16 0943 .  potassium chloride (K-DUR,KLOR-CON) CR tablet 30 mEq, 30 mEq, Oral, BID, Patrecia Pour, MD, 30 mEq at 11/21/16 0943 .  protriptyline (VIVACTIL) tablet 10 mg, 10 mg, Oral, TID, Patrecia Pour, MD, 10 mg at 11/21/16 0944 .  sodium bicarbonate 150 mEq in sterile water 1,000 mL infusion, , Intravenous, Continuous, Patrecia Pour, MD, Last Rate: 50 mL/hr at 11/20/16 2233 .  sodium chloride flush (NS) 0.9 % injection 3 mL, 3 mL, Intravenous, Q12H, Patrecia Pour, MD, 3 mL at 11/21/16 0949 .  SUMAtriptan (IMITREX) tablet 100 mg, 100 mg, Oral, Q2H PRN, Patrecia Pour, MD .  traZODone (DESYREL) tablet 25 mg, 25 mg, Oral, QHS PRN, Patrecia Pour, MD  EXAM:  Vitals:   11/19/16 1358  BP: (!) 80/50  Pulse: (!) 101  Temp: 97.3 F (36.3 C)    There is no height or weight on file to calculate BMI.  GENERAL: vitals reviewed and listed above, appears frail, pale and dehydrated with decreased skin turgor and hollow facial features  HEENT: atraumatic, conjunttiva clear, no obvious abnormalities on  inspection of external nose and ears  NECK: no obvious masses on inspection  LUNGS: clear to auscultation bilaterally, no wheezes, rales or rhonchi, good air movement  CV: HRRR, no peripheral edema  ABD: soft, NTTP  MS: moves all extremities without noticeable abnormality  PSYCH: pleasant and cooperative, no obvious depression or anxiety  ASSESSMENT AND PLAN:  Discussed the following assessment and plan:  Weakness  Sepsis, due to unspecified organism (HCC)  Tachycardia  Hypotension, unspecified hypotension type  Diarrhea, unspecified type  -she appears to be doing very poorly and is dehydrated. With AMS, hypotension, tachycardia and recent admission for sepsis needs prompt evaluation in ER and likely readmission to determine etiology and to treat dehydration -offered and advised EMS which they refused  -  staff advised to notify ER triage of situation and my concern -Patient advised to return or notify a doctor immediately if symptoms worsen or persist or new concerns arise.  There are no Patient Instructions on file for this visit.  Colin Benton R., DO

## 2016-11-19 NOTE — Progress Notes (Signed)
Pre visit review using our clinic review tool, if applicable. No additional management support is needed unless otherwise documented below in the visit note. 

## 2016-11-19 NOTE — ED Triage Notes (Signed)
Pt was hypotensive at PCP office today. Reports CP and SOB. Was altered per note. BP 88/43 in triage

## 2016-11-19 NOTE — H&P (Signed)
History and Physical   Danielle Gaines [id number] DOB: 12-28-1940 DOA: 11/19/2016  Referring MD/NP/PA: Dr. Laneta Simmers, [place] PCP: Lucretia Kern., DO Outpatient Specialists: GI, Dr. Earlean Shawl  Patient coming from: Home  Chief Complaint: Weakness  HPI: Danielle Gaines is a 75 y.o. female with a complicated history of MDD, IBS, migraines, orthostatic hypotension, chronic pain, and hypercalcemia sent to the ED 12/22 from hospital follow up appointment with PCP.   She was discharged 12/13 after an admission for sepsis due to Klebsiella which was sensitive to cipro. She completed this course, though her husband reports she has continued to feel very weak, having fallen today due to weakness. She's also short of breath, a chronic problem for her, with AMS worsening over the past 2 days. She reports diffuse pain in her abdomen at about baseline, aching, constant, moderate. She's only been eating soup and drinking water and boost only since discharge and endorses chronic diarrhea, though this is improved from her baseline thought to be due to IBS. On arrival to ED, SBP was in 80's, she was afebrile appearing dehydrated. Initial work up significant for Na 123, Cr 2.7 with elevated BUN, bicarbonate of 10. There was a leukocytosis (WBC 18.9) and polycythemia (hgb 16), but normal lactate. CXR and [place] without evidence of infection.    Review of Systems: No dysphagia, odynophagia. Has had nausea and emesis without identifiable trigger per pt. Has had some loose stools that are actually less frequent than her baseline, no bleeding. All others reviewed and are negative.   Past Medical History:  Diagnosis Date  . Arthritis   . Benzodiazepine dependence ([place])   . Carotid arterial disease (HCC)    mild  . Chronic pain    managed by pain clinic  . Colon abnormality    didn't work right so part of it was removed at [facility]  . Depression    managed by Dr. Toy Care  . Diastolic heart failure ([place])    mild on echo 2016    . GERD (gastroesophageal reflux disease)   . IBS (irritable bowel syndrome)    sees Dr. Earlean Shawl  . Macular degeneration    goes to [facility] for this  . Migraine    managed by neurologist  . Osteoporosis     Past Surgical History:  Procedure Laterality Date  . ABDOMINAL ADHESION SURGERY    . ABDOMINAL HYSTERECTOMY    . APPENDECTOMY    . CHOLECYSTECTOMY    . COLON SURGERY    . CYSTOSCOPY W/ URETERAL STENT PLACEMENT Right 04/27/2016   Procedure: CYSTOSCOPY WITH RETROGRADE PYELOGRAM/ RIGHT URETERAL STENT PLACEMENT;  Surgeon: Ardis Hughs, MD;  Location: [facility];  Service: Urology;  Laterality: Right;  . OVARIAN CYST SURGERY    . SMALL INTESTINE SURGERY     - Never smoker, never drinker, lives at home with husband and is dependent for most ADLs.  Allergies  Allergen Reactions  . Sulfa Antibiotics Swelling and Other (See Comments)    Reaction:  Unspecified swelling reaction     Family History  Problem Relation Age of Onset  . Kidney disease Mother   . Heart disease Mother   . Cancer Maternal Aunt     intestinal and liver cancer  . Stroke Maternal Grandmother   . Stroke    . Depression      everyone  . Hypercalcemia Neg Hx    - Family history otherwise reviewed and not pertinent.  Prior to Admission medications   Medication  Sig Start Date End Date Taking? Authorizing Provider  acetaminophen (TYLENOL) 500 MG tablet Take 1,000 mg by mouth every 6 (six) hours as needed for mild pain, moderate pain or headache.    Yes Historical Provider, MD  ADDERALL XR 30 MG 24 hr capsule Take 30 mg by mouth daily.    Yes Historical Provider, MD  alendronate (FOSAMAX) 70 MG tablet Take 70 mg by mouth every Saturday. Take with a full glass of water on an empty stomach.   Yes Historical Provider, MD  Biotin 1000 MCG tablet Take 1,000 mcg by mouth daily.   Yes Historical Provider, MD  cycloSPORINE (RESTASIS) 0.05 % ophthalmic emulsion Place 1 drop into both eyes 2 (two) times daily.    Yes  Historical Provider, MD  hyoscyamine (LEVBID) 0.375 MG 12 hr tablet Take 0.375 mg by mouth 2 (two) times daily.   Yes Historical Provider, MD  LINZESS 290 MCG CAPS capsule Take 290 mcg by mouth daily before breakfast.    Yes Historical Provider, MD  metaxalone (SKELAXIN) 800 MG tablet Take 800 mg by mouth 3 (three) times daily as needed for muscle spasms.   Yes Historical Provider, MD  methocarbamol (ROBAXIN) 500 MG tablet Take 500 mg by mouth every 6 (six) hours as needed for muscle spasms.    Yes Historical Provider, MD  OLANZapine-FLUoxetine (SYMBYAX) 3-25 MG capsule Take 1 capsule by mouth at bedtime.   Yes Historical Provider, MD  omeprazole (PRILOSEC) 40 MG capsule Take 40 mg by mouth daily.    Yes Historical Provider, MD  ondansetron (ZOFRAN) 4 MG tablet Take 1 tablet (4 mg total) by mouth every 8 (eight) hours as needed for nausea or vomiting. 11/11/16  Yes Patrecia Pour, MD  protriptyline (VIVACTIL) 10 MG tablet Take 20 mg by mouth 3 (three) times daily.    Yes Historical Provider, MD  SUMAtriptan (IMITREX) 100 MG tablet Take 100 mg by mouth every 2 (two) hours as needed for migraine.    Yes Historical Provider, MD  topiramate (TOPAMAX) 100 MG tablet Take 100-200 mg by mouth 2 (two) times daily. Pt takes one tablet in the morning and two at bedtime.   Yes Historical Provider, MD  traZODone (DESYREL) 50 MG tablet Take 50 mg by mouth at bedtime.   Yes Historical Provider, MD  ZENPEP 15000 units CPEP Take 45,000 mg by mouth 3 (three) times daily before meals.    Yes Historical Provider, MD    Physical Exam: Vitals:   11/19/16 1618 11/19/16 1624 11/19/16 1630 11/19/16 1714  BP:   138/56 130/56  Pulse:    104  Resp: 26 23 18  (!) 37  Temp:      TempSrc:      SpO2: 99% 99% 99% 100%   Constitutional: 75 y.o. female in no distress, calm demeanor Eyes: Lids and conjunctivae normal, PERRL ENMT: Mucous membranes are dry. Posterior pharynx clear of any exudate or lesions. No dentition.  Neck:  normal, supple, no masses, no thyromegaly Respiratory: Non-labored breathing room air without accessory muscle use. Clear breath sounds to auscultation bilaterally. Cardiovascular: Tachycardic rate and normal sinus rhythm, no murmurs, rubs, or gallops. No carotid bruits. No JVD. No LE edema. No pedal pulses. Abdomen: Normoactive bowel sounds. Generally tender without rebound or guarding without localization, non-distended, and no masses palpated. No hepatosplenomegaly. Longitudinal midline and RUQ transverse surgical incision scars.  GU: No indwelling catheter Musculoskeletal: No clubbing / cyanosis. No joint deformity upper and lower extremities. Good ROM, no contractures.  Normal muscle tone.  Skin: Warm, dry. No rashes, wounds, no ulcers. No significant lesions noted.  Neurologic: CN II-XII grossly intact. Gait not assessed due to weakness. Speech normal. No focal deficits in motor strength or sensation in all extremities. DTRs 2+.  Psychiatric: Alert and oriented x3. Mild cognitive impairment suspected, no formal MMSE performed at admission. Mood euthymic with congruent affect.   Labs on Admission: I have personally reviewed following labs and imaging studies  CBC:  Recent Labs Lab 11/19/16 1506 11/19/16 1531  WBC 18.9*  --   NEUTROABS 16.6*  --   HGB 14.4 16.0*  HCT 40.9 47.0*  MCV 88.5  --   PLT 460*  --    Basic Metabolic Panel:  Recent Labs Lab 11/19/16 1506 11/19/16 1531  NA 123* 125*  K 3.1* 3.6  CL 102 105  CO2 10*  --   GLUCOSE 121* 117*  BUN 69* 61*  CREATININE 2.68* 2.70*  CALCIUM 8.6*  --    GFR: Estimated Creatinine Clearance: 14.7 mL/min (by C-G formula based on SCr of 2.7 mg/dL (H)). Liver Function Tests:  Recent Labs Lab 11/19/16 1506  AST 22  ALT 34  ALKPHOS 129*  BILITOT 0.4  PROT 7.6  ALBUMIN 3.7   No results for input(s): LIPASE, AMYLASE in the last 168 hours. No results for input(s): AMMONIA in the last 168 hours. Coagulation  Profile:  Recent Labs Lab 11/19/16 1506  INR 1.07   Cardiac Enzymes: No results for input(s): CKTOTAL, CKMB, CKMBINDEX, TROPONINI in the last 168 hours. BNP (last 3 results) No results for input(s): PROBNP in the last 8760 hours. HbA1C: No results for input(s): HGBA1C in the last 72 hours. CBG: No results for input(s): GLUCAP in the last 168 hours. Lipid Profile: No results for input(s): CHOL, HDL, LDLCALC, TRIG, CHOLHDL, LDLDIRECT in the last 72 hours. Thyroid Function Tests: No results for input(s): TSH, T4TOTAL, FREET4, T3FREE, THYROIDAB in the last 72 hours. Anemia Panel: No results for input(s): VITAMINB12, FOLATE, FERRITIN, TIBC, IRON, RETICCTPCT in the last 72 hours. Urine analysis:    Component Value Date/Time   COLORURINE STRAW (A) 11/19/2016 1500   APPEARANCEUR CLEAR 11/19/2016 1500   LABSPEC 1.005 11/19/2016 1500   PHURINE 5.0 11/19/2016 1500   GLUCOSEU NEGATIVE 11/19/2016 1500   GLUCOSEU NEGATIVE 09/17/2016 1131   HGBUR NEGATIVE 11/19/2016 1500   BILIRUBINUR NEGATIVE 11/19/2016 1500   BILIRUBINUR negative 09/17/2016 1113   KETONESUR NEGATIVE 11/19/2016 1500   PROTEINUR NEGATIVE 11/19/2016 1500   UROBILINOGEN 0.2 09/17/2016 1131   NITRITE NEGATIVE 11/19/2016 1500   LEUKOCYTESUR NEGATIVE 11/19/2016 1500   Sepsis Labs: @LABRCNTIP (procalcitonin:4,lacticidven:4) )No results found for this or any previous visit (from the past 240 hour(s)).   Radiological Exams on Admission: Dg Chest Port 1 View  Result Date: 11/19/2016 CLINICAL DATA:  Hypotension EXAM: PORTABLE CHEST 1 VIEW COMPARISON:  11/06/2016 FINDINGS: The heart size and mediastinal contours are within normal limits. Both lungs are clear. The visualized skeletal structures are unremarkable. IMPRESSION: No active disease. Electronically Signed   By: Franchot Gallo M.D.   On: 11/19/2016 15:09    EKG: Independently reviewed. NSR vent rate 92, nearly LAD with normal intervals, no ischemic changes, similar to  prior tracings. QTc 440msec.  Assessment/Plan Active Problems:   Anxiety and depression - managed at [facility]   GERD (gastroesophageal reflux disease)   MDD (major depressive disorder), recurrent severe, without psychosis ([place])   Malnutrition of moderate degree   Acute hyponatremia   Hyponatremia: Presumed  acute (last wnl at discharge 12/13) due to decreased solute and increased free water intake (at least 64 fl oz water daily with soup and boost).  - Give isotonic fluids, as suspect this is hypovolemic. Check BMP q6h to prevent over/under correction - Check osmolalities  GERD/IBS: Chronic, stable. Denies dysphagia/odynophagia.  - Continue PPI therapy.  - Continue zofran - Continue hyoscyamine - With ongoing abdominal pain, would recommend checking CT abd/pelvis once AKI is improved for contrast administration. No peritoneal signs indicating urgent scan.  AKI with metabolic acidosis: SCr 2.7 on admission, presumed baseline 0.8 - 1.2.  - Will give IV fluids with bicarbonate - Recheck BMP q6h to monitor renal function, electrolytes and acidosis.   Acute encephalopathy: Oriented, nonfocal, so no indication for neuroimaging after recently negative CT head. Suspect due to dehydration, hyponatremia, and mild uremia w/BUN 61.  - Neuro checks overnight  Moderate malnutrition/failure to thrive: Exacerbated by chronic diarrhea/IBS/poor PO in setting of prior colectomy. SNF was recommended following last admission, though this was declined by pt and husband. I suspect this would be the best disposition from this hospitalization as well and recommended considering this at admission.  - Restart protein supplementation - PT/OT evaluation - CSW consulted  Leukocytosis: Suspect hemoconcentrated without evident signs/symptoms of infection not attributable to dehydration. CXR and UA neg. - Blood cultures x2 - IVF's - Hold antimicrobials  History of Klebsiella UTI: s/p full course of cipro  with negative UA and no symptoms.  - Culture ordered in ED due to recurrence of infections in the past  Psych: Resume medications she takes, will hold adderall as this is not a regular med for her and will diminish appetite.  - Continue olanzapine, fluoxetine, protriptyline, and trazodone.  - Monitor for serotonin syndrome   DVT prophylaxis: Heparin  Code Status: Full confirmed at admission  Family Communication: Husband at bedside Disposition Plan: Admit to SDU for management of encephalopathy and hyponatremia Consults called: None  Admission status: Inpatient  Vance Gather, MD [facility] Pager [phone]  If 7PM-7AM, please contact night-coverage [email or web address] Password [email or web address] 11/19/2016, 6:32 PM

## 2016-11-20 LAB — BASIC METABOLIC PANEL
Anion gap: 11 (ref 5–15)
Anion gap: 10 (ref 5–15)
Anion gap: 8 (ref 5–15)
BUN: 50 mg/dL — ABNORMAL HIGH (ref 6–20)
BUN: 40 mg/dL — ABNORMAL HIGH (ref 6–20)
BUN: 38 mg/dL — ABNORMAL HIGH (ref 6–20)
CO2: 13 mmol/L — ABNORMAL LOW (ref 22–32)
CO2: 17 mmol/L — ABNORMAL LOW (ref 22–32)
CO2: 20 mmol/L — ABNORMAL LOW (ref 22–32)
Calcium: 7.9 mg/dL — ABNORMAL LOW (ref 8.9–10.3)
Calcium: 8 mg/dL — ABNORMAL LOW (ref 8.9–10.3)
Calcium: 9.9 mg/dL (ref 8.9–10.3)
Chloride: 106 mmol/L (ref 101–111)
Chloride: 109 mmol/L (ref 101–111)
Chloride: 106 mmol/L (ref 101–111)
Creatinine, Ser: 1.62 mg/dL — ABNORMAL HIGH (ref 0.44–1.00)
Creatinine, Ser: 1.28 mg/dL — ABNORMAL HIGH (ref 0.44–1.00)
Creatinine, Ser: 1.4 mg/dL — ABNORMAL HIGH (ref 0.44–1.00)
GFR calc Af Amer: 35 mL/min — ABNORMAL LOW (ref >60)
GFR calc Af Amer: 47 mL/min — ABNORMAL LOW (ref >60)
GFR calc Af Amer: 42 mL/min — ABNORMAL LOW (ref >60)
GFR calc non Af Amer: 30 mL/min — ABNORMAL LOW (ref >60)
GFR calc non Af Amer: 40 mL/min — ABNORMAL LOW (ref >60)
GFR calc non Af Amer: 36 mL/min — ABNORMAL LOW (ref >60)
Glucose, Bld: 111 mg/dL — ABNORMAL HIGH (ref 65–99)
Glucose, Bld: 108 mg/dL — ABNORMAL HIGH (ref 65–99)
Glucose, Bld: 122 mg/dL — ABNORMAL HIGH (ref 65–99)
Potassium: 2.4 mmol/L — CL (ref 3.5–5.1)
Potassium: 3.7 mmol/L (ref 3.5–5.1)
Potassium: 4.8 mmol/L (ref 3.5–5.1)
Sodium: 130 mmol/L — ABNORMAL LOW (ref 135–145)
Sodium: 136 mmol/L (ref 135–145)
Sodium: 134 mmol/L — ABNORMAL LOW (ref 135–145)

## 2016-11-20 LAB — CBC
HCT: 36.2 % (ref 36.0–46.0)
Hemoglobin: 12.9 g/dL (ref 12.0–15.0)
MCH: 31.2 pg (ref 26.0–34.0)
MCHC: 35.6 g/dL (ref 30.0–36.0)
MCV: 87.7 fL (ref 78.0–100.0)
Platelets: 401 10*3/uL — ABNORMAL HIGH (ref 150–400)
RBC: 4.13 MIL/uL (ref 3.87–5.11)
RDW: 12.1 % (ref 11.5–15.5)
WBC: 9.3 10*3/uL (ref 4.0–10.5)

## 2016-11-20 LAB — URINE CULTURE

## 2016-11-20 MED ORDER — CALCIUM GLUCONATE 10 % IV SOLN
2.0000 g | Freq: Once | INTRAVENOUS | Status: AC
  Administered 2016-11-20: 2 g via INTRAVENOUS
  Filled 2016-11-20: qty 20

## 2016-11-20 MED ORDER — POTASSIUM CHLORIDE 20 MEQ/15ML (10%) PO SOLN
40.0000 meq | ORAL | Status: DC
  Administered 2016-11-20 (×3): 40 meq via ORAL
  Filled 2016-11-20 (×3): qty 30

## 2016-11-20 MED ORDER — POTASSIUM CHLORIDE 10 MEQ/100ML IV SOLN
10.0000 meq | INTRAVENOUS | Status: AC
  Administered 2016-11-20 (×3): 10 meq via INTRAVENOUS
  Filled 2016-11-20 (×3): qty 100

## 2016-11-20 NOTE — Evaluation (Signed)
Physical Therapy Evaluation Patient Details Name: Danielle Gaines MRN: [id number] DOB: 1941-03-26 Today's Date: 11/20/2016   History of Present Illness  Pt admitted from home with c/o CP/SOB/hypotension and hx of CAD, osteoporosis, chronic pain and Macular degeneration  Clinical Impression  Pt admitted as above and presenting with functional mobility limitations 2* generalized weakness, decreased endurance and ambulatory balance deficits.  Pt should progress to dc home with assist of spouse and would benefit from follow up HHPT to further address deficits.    Follow Up Recommendations Home health PT    Equipment Recommendations  None recommended by PT    Recommendations for Other Services OT consult     Precautions / Restrictions Precautions Precautions: Fall Restrictions Weight Bearing Restrictions: No      Mobility  Bed Mobility Overal bed mobility: Needs Assistance Bed Mobility: Supine to Sit;Sit to Supine     Supine to sit: Min guard Sit to supine: Min guard   General bed mobility comments: moves self  Transfers Overall transfer level: Needs assistance Equipment used: 1 person hand held assist Transfers: Sit to/from Stand Sit to Stand: Min guard         General transfer comment: steady assist  Ambulation/Gait Ambulation/Gait assistance: Min assist Ambulation Distance (Feet): 200 Feet Assistive device: Rolling walker (2 wheeled) Gait Pattern/deviations: Step-to pattern;Decreased step length - right;Decreased step length - left;Shuffle;Trunk flexed Gait velocity: decr Gait velocity interpretation: Below normal speed for age/gender General Gait Details: Decreased pace and noted instability largely corrected with use of RW  Stairs            Wheelchair Mobility    Modified Rankin (Stroke Patients Only)       Balance Overall balance assessment: Needs assistance;History of Falls Sitting-balance support: Feet supported;No upper extremity  supported Sitting balance-Leahy Scale: Fair     Standing balance support: During functional activity Standing balance-Leahy Scale: Poor Standing balance comment: needs UE support                             Pertinent Vitals/Pain Pain Assessment: No/denies pain    Home Living Family/patient expects to be discharged to:: Private residence Living Arrangements: Spouse/significant other Available Help at Discharge: Family;Available PRN/intermittently Type of Home: House Home Access: Stairs to enter Entrance Stairs-Rails: Right Entrance Stairs-Number of Steps: 3 Home Layout: Two level;Able to live on main level with bedroom/bathroom Home Equipment: Gilford Rile - 2 wheels;Bedside commode Additional Comments: spouse works per pt    Prior Function Level of Independence: Independent               Hand Dominance   Dominant Hand: Right    Extremity/Trunk Assessment   Upper Extremity Assessment Upper Extremity Assessment: Generalized weakness    Lower Extremity Assessment Lower Extremity Assessment: Generalized weakness    Cervical / Trunk Assessment Cervical / Trunk Assessment: Kyphotic  Communication   Communication: No difficulties  Cognition Arousal/Alertness: Awake/alert Behavior During Therapy: WFL for tasks assessed/performed;Impulsive Overall Cognitive Status: Within Functional Limits for tasks assessed                      General Comments      Exercises General Exercises - Lower Extremity Ankle Circles/Pumps: AROM;Both;15 reps;Supine   Assessment/Plan    PT Assessment Patient needs continued PT services  PT Problem List Decreased strength;Decreased activity tolerance;Decreased balance;Decreased mobility;Decreased safety awareness;Decreased knowledge of precautions          PT  Treatment Interventions DME instruction;Gait training;Stair training;Functional mobility training;Therapeutic activities;Therapeutic exercise;Patient/family  education    PT Goals (Current goals can be found in the Care Plan section)  Acute Rehab PT Goals Patient Stated Goal: Walk independently PT Goal Formulation: With patient Time For Goal Achievement: 11/23/16 Potential to Achieve Goals: Good    Frequency Min 3X/week   Barriers to discharge        Co-evaluation               End of Session Equipment Utilized During Treatment: Gait belt Activity Tolerance: Patient tolerated treatment well Patient left: in bed;with call bell/phone within reach Nurse Communication: Mobility status         Time: 1000-1023 PT Time Calculation (min) (ACUTE ONLY): 23 min   Charges:   PT Evaluation $PT Eval Low Complexity: 1 Procedure PT Treatments $Gait Training: 8-22 mins   PT G Codes:        Adley Mazurowski 12-11-2016, 12:32 PM

## 2016-11-20 NOTE — Progress Notes (Signed)
PROGRESS NOTE  [name]  [id number] DOB: 23-Dec-1940 DOA: 11/19/2016 PCP: Lucretia Kern., DO  Outpatient Specialists: Dr. Earlean Shawl, GI  Brief Narrative: Danielle Gaines is a 75 y.o. female with a complicated history of MDD, IBS, migraines, orthostatic hypotension, chronic pain, and hypercalcemia sent to the ED 12/22 from hospital follow up appointment with PCP.   She was discharged 12/13 after an admission for sepsis due to Klebsiella bacteremia which was sensitive to cipro. She completed this course, though her husband reports she has continued to feel very weak, having fallen due to weakness. She's also been more short of breath, a chronic problem for her, with AMS worsening over the past 2 days. She reports diffuse pain in her abdomen at about baseline, aching, constant, moderate. She's only been eating soup and drinking water and boost only since discharge and endorses chronic diarrhea, though this is improved from her baseline thought to be due to IBS. On arrival to ED, SBP was in 80's, she was afebrile appearing dehydrated. Initial work up significant for Na 123, Cr 2.7 with elevated BUN, bicarbonate of 10. There was a leukocytosis (WBC 18.9) and polycythemia (hgb 16), but normal lactate. CXR and [place] without evidence of infection. She was given IV fluid boluses and infusion with isotonic bicarbonate with appropriate improvement in sodium and acidosis. Next day sodium was 130, bicarbonate 13. Topamax was stopped due to concern of its contribution to acidosis and poor appetite.  Assessment & Plan: Active Problems:   Anxiety and depression - managed at [facility]   GERD (gastroesophageal reflux disease)   MDD (major depressive disorder), recurrent severe, without psychosis ([place])   Malnutrition of moderate degree   Acute hyponatremia  Hyponatremia: Presumed acute (last wnl at discharge 12/13) due to decreased solute and increased free water intake (at least 64 fl oz water daily with soup  and boost).  - Continue isotonic fluids, as suspect this is hypovolemic. Check BMP q6h to prevent over correction - Urine osmolality 234 (would expect < 100 with normal kidney function), urine sodium inappropriately high at 59.   GERD/IBS: Chronic, stable. Denies dysphagia/odynophagia.  - Continue PPI therapy.  - Continue zofran - Continue hyoscyamine - With ongoing abdominal pain, would recommend checking CT abd/pelvis once AKI is improved for contrast administration. No peritoneal signs indicating urgent scan.  AKI with metabolic acidosis: SCr 2.7 on admission, presumed baseline 0.8 - 1.2.  - Will give IV fluids with bicarbonate - Continue BMP q6h to monitor renal function, electrolytes and acidosis.   Acute encephalopathy: Oriented, nonfocal, so no indication for neuroimaging after recently negative CT head. Suspect due to dehydration, hyponatremia, and mild uremia w/BUN 61.  - No focal deficits to explain weakness/lassitude  Moderate malnutrition/failure to thrive/diffuse weakness vs. 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 SNF was recommended following last admission, though this was declined by pt and husband. I suspect this would be the best disposition from this hospitalization as well and recommended considering this at admission.  - Restart protein supplementation - PT/OT evaluation - CSW consulted  Leukocytosis: Suspect hemoconcentrated without evident signs/symptoms of infection not attributable to dehydration. CXR and UA neg. Resolved. - Blood cultures x2 (12/22), urine culture (12/22) - IVF's - Hold antimicrobials  Psych: Resume medications she takes, will hold adderall as this is not a regular med for her and will diminish appetite.  - Continue olanzapine, fluoxetine, protriptyline, and trazodone.  - Holding topiramate due to acidosis. - Monitor for serotonin  syndrome   DVT prophylaxis: Heparin Code Status:  Full Family Communication: None at bedside this AM Disposition Plan: Continue SDU until sodium stable, possible transfer to tele later in the PM.  Consultants:   None  Procedures:   None  Antimicrobials:  None   Subjective: Pt feels less weak from admission, got up twice to the bathroom. Slept ok. No pain. Abd pain at baseline, no vomiting since admission.   Objective: Vitals:   11/20/16 0000 11/20/16 0200 11/20/16 0400 11/20/16 0600  BP: (!) 137/59 (!) 134/41 (!) 132/49 (!) 140/48  Pulse:      Resp: 13 15 14 13   Temp: 97.9 F (36.6 C)     TempSrc: Oral     SpO2: 100% 100%  99%  Weight:      Height:        Intake/Output Summary (Last 24 hours) at 11/20/16 0710 Last data filed at 11/20/16 [id number]  Gross per 24 hour  Intake          1271.67 ml  Output              900 ml  Net           371.67 ml   Filed Weights   11/19/16 1930  Weight: 50 kg (110 lb 3.7 oz)   Examination: General exam: 75 y.o. female in no distress Respiratory system: Non-labored breathing room air. Clear to auscultation bilaterally.  Cardiovascular system: Regular rate and rhythm. No murmur, rub, or gallop. No JVD, and no pedal edema. Gastrointestinal system: Abdomen soft, diffusely mildly tender without rebound, non-distended, with normoactive bowel sounds. No organomegaly or masses felt. Central nervous system: Alert and oriented. No focal neurological deficits. Extremities: Warm, no deformities Skin: No rashes, lesions, ulcers Psychiatry: Judgement and insight appear normal. Mood & affect appropriate.   Data Reviewed: I have personally reviewed following labs and imaging studies  CBC:  Recent Labs Lab 11/19/16 1506 11/19/16 1531 11/19/16 2027 11/20/16 0057  WBC 18.9*  --  14.9* 9.3  NEUTROABS 16.6*  --   --   --   HGB 14.4 16.0* 14.6 12.9  HCT 40.9 47.0* 41.2 36.2  MCV 88.5  --  88.6 87.7  PLT 460*  --  472* [id number]*   Basic Metabolic Panel:  Recent Labs Lab 11/19/16 1506  11/19/16 1531 11/19/16 2027 11/19/16 2130 11/20/16 0057  NA 123* 125* 130*  --  130*  K 3.1* 3.6 2.5*  --  2.4*  CL 102 105 109  --  106  CO2 10*  --  9*  --  13*  GLUCOSE 121* 117* 145*  --  111*  BUN 69* 61* 56*  --  50*  CREATININE 2.68* 2.70* 1.97*  2.07*  --  1.62*  CALCIUM 8.6*  --  8.0*  --  7.9*  MG  --   --   --  2.0  --    GFR: Estimated Creatinine Clearance: 23.3 mL/min (by C-G formula based on SCr of 1.62 mg/dL (H)). Liver Function Tests:  Recent Labs Lab 11/19/16 1506  AST 22  ALT 34  ALKPHOS 129*  BILITOT 0.4  PROT 7.6  ALBUMIN 3.7   No results for input(s): LIPASE, AMYLASE in the last 168 hours. No results for input(s): AMMONIA in the last 168 hours. Coagulation Profile:  Recent Labs Lab 11/19/16 1506  INR 1.07   Cardiac Enzymes: No results for input(s): CKTOTAL, CKMB, CKMBINDEX, TROPONINI in the last 168 hours. BNP (last 3 results) No  results for input(s): PROBNP in the last 8760 hours. HbA1C: No results for input(s): HGBA1C in the last 72 hours. CBG: No results for input(s): GLUCAP in the last 168 hours. Lipid Profile: No results for input(s): CHOL, HDL, LDLCALC, TRIG, CHOLHDL, LDLDIRECT in the last 72 hours. Thyroid Function Tests: No results for input(s): TSH, T4TOTAL, FREET4, T3FREE, THYROIDAB in the last 72 hours. Anemia Panel: No results for input(s): VITAMINB12, FOLATE, FERRITIN, TIBC, IRON, RETICCTPCT in the last 72 hours. Urine analysis:    Component Value Date/Time   COLORURINE STRAW (A) 11/19/2016 1500   APPEARANCEUR CLEAR 11/19/2016 1500   LABSPEC 1.005 11/19/2016 1500   PHURINE 5.0 11/19/2016 1500   GLUCOSEU NEGATIVE 11/19/2016 1500   GLUCOSEU NEGATIVE 09/17/2016 1131   HGBUR NEGATIVE 11/19/2016 1500   BILIRUBINUR NEGATIVE 11/19/2016 1500   BILIRUBINUR negative 09/17/2016 1113   KETONESUR NEGATIVE 11/19/2016 1500   PROTEINUR NEGATIVE 11/19/2016 1500   UROBILINOGEN 0.2 09/17/2016 1131   NITRITE NEGATIVE 11/19/2016 1500    LEUKOCYTESUR NEGATIVE 11/19/2016 1500   Radiology Studies: Dg Chest Port 1 View  Result Date: 11/19/2016 CLINICAL DATA:  Hypotension EXAM: PORTABLE CHEST 1 VIEW COMPARISON:  11/06/2016 FINDINGS: The heart size and mediastinal contours are within normal limits. Both lungs are clear. The visualized skeletal structures are unremarkable. IMPRESSION: No active disease. Electronically Signed   By: Franchot Gallo M.D.   On: 11/19/2016 15:09    Scheduled Meds: . calcium gluconate  2 g Intravenous Once  . cycloSPORINE  1 drop Both Eyes BID  . feeding supplement (ENSURE ENLIVE)  237 mL Oral BID BM  . heparin  5,000 Units Subcutaneous Q8H  . hyoscyamine  0.375 mg Oral BID  . linaclotide  290 mcg Oral QAC breakfast  . lipase/protease/amylase  36,000 Units Oral TID AC  . OLANZapine-FLUoxetine  1 capsule Oral QHS  . pantoprazole  80 mg Oral Daily  . potassium chloride  40 mEq Oral Q2H  . protriptyline  20 mg Oral TID  . sodium chloride flush  3 mL Intravenous Q12H  . traZODone  50 mg Oral QHS   Continuous Infusions: .  sodium bicarbonate 150 mEq in sterile water 1000 mL infusion 100 mL/hr at 11/20/16 0611     LOS: 1 day   Time spent: 25 minutes.  Vance Gather, MD [facility] Pager [phone]  If 7PM-7AM, please contact night-coverage [email or web address] Password [email or web address] 11/20/2016, 7:10 AM

## 2016-11-20 NOTE — Progress Notes (Signed)
OT Cancellation Note  Patient Details Name: Danielle Gaines MRN: [id number] DOB: 03-Jan-1941   Cancelled Treatment:    Reason Eval/Treat Not Completed: Fatigue/lethargy limiting ability to participate.  Pt is fatiqued this afternoon. Will check back on her in the morning.  Ashford Clouse 11/20/2016, 2:00 PM  Lesle Chris, OTR/L [phone] 11/20/2016

## 2016-11-21 DIAGNOSIS — G9340 Encephalopathy, unspecified: Secondary | ICD-10-CM

## 2016-11-21 LAB — CBC
HCT: 34.4 % — ABNORMAL LOW (ref 36.0–46.0)
Hemoglobin: 12.2 g/dL (ref 12.0–15.0)
MCH: 31.2 pg (ref 26.0–34.0)
MCHC: 35.5 g/dL (ref 30.0–36.0)
MCV: 88 fL (ref 78.0–100.0)
Platelets: 426 10*3/uL — ABNORMAL HIGH (ref 150–400)
RBC: 3.91 MIL/uL (ref 3.87–5.11)
RDW: 12.4 % (ref 11.5–15.5)
WBC: 6.9 10*3/uL (ref 4.0–10.5)

## 2016-11-21 LAB — BASIC METABOLIC PANEL
Anion gap: 8 (ref 5–15)
BUN: 37 mg/dL — ABNORMAL HIGH (ref 6–20)
CO2: 22 mmol/L (ref 22–32)
Calcium: 9 mg/dL (ref 8.9–10.3)
Chloride: 105 mmol/L (ref 101–111)
Creatinine, Ser: 1.41 mg/dL — ABNORMAL HIGH (ref 0.44–1.00)
GFR calc Af Amer: 42 mL/min — ABNORMAL LOW (ref >60)
GFR calc non Af Amer: 36 mL/min — ABNORMAL LOW (ref >60)
Glucose, Bld: 101 mg/dL — ABNORMAL HIGH (ref 65–99)
Potassium: 3.1 mmol/L — ABNORMAL LOW (ref 3.5–5.1)
Sodium: 135 mmol/L (ref 135–145)

## 2016-11-21 LAB — CORTISOL-AM, BLOOD: Cortisol - AM: 16.7 ug/dL (ref 6.7–22.6)

## 2016-11-21 MED ORDER — POTASSIUM CHLORIDE CRYS ER 10 MEQ PO TBCR
30.0000 meq | EXTENDED_RELEASE_TABLET | Freq: Two times a day (BID) | ORAL | Status: AC
  Administered 2016-11-21 (×2): 30 meq via ORAL
  Filled 2016-11-21 (×2): qty 1

## 2016-11-21 MED ORDER — TRAZODONE HCL 50 MG PO TABS: 25.0000 mg | ORAL_TABLET | Freq: Every evening | ORAL | Status: DC | PRN

## 2016-11-21 MED ORDER — PROTRIPTYLINE HCL 10 MG PO TABS
10.0000 mg | ORAL_TABLET | Freq: Three times a day (TID) | ORAL | Status: DC
  Administered 2016-11-21 (×2): 10 mg via ORAL
  Administered 2016-11-21: 5 mg via ORAL
  Administered 2016-11-22 – 2016-11-23 (×4): 10 mg via ORAL

## 2016-11-21 NOTE — Progress Notes (Signed)
Pt slow to respond,keeps eyes closed and sleeps in between my questions. Husband at bedside stated pt "slept a lot of the day". Held Trazodone. Pt slept comfortably all night.

## 2016-11-21 NOTE — Evaluation (Signed)
Occupational Therapy Evaluation Patient Details Name: Danielle Gaines MRN: [id number] DOB: 05/08/1941 Today's Date: 11/21/2016    History of Present Illness Pt admitted from home with c/o CP/SOB/hypotension and hx of CAD, osteoporosis, chronic pain and Macular degeneration   Clinical Impression   This 75 year old female was admitted for the above.  She was independent prior to admission and will benefit from continued OT in acute setting. Goals are for supervision level    Follow Up Recommendations  Supervision/Assistance - 24 hour;Home health OT    Equipment Recommendations   (pt has 3:1 commode)    Recommendations for Other Services       Precautions / Restrictions Precautions Precautions: Fall Restrictions Weight Bearing Restrictions: No      Mobility Bed Mobility         Supine to sit: Min guard     General bed mobility comments: for safety:  bed is high  Transfers   Equipment used: Rolling walker (2 wheeled)   Sit to Stand: Min guard         General transfer comment: for safety    Balance               Standing balance comment: min guard for safety:  unsteady                            ADL Overall ADL's : Needs assistance/impaired     Grooming: Min guard;Standing;Oral care   Upper Body Bathing: Set up;Sitting   Lower Body Bathing: Minimal assistance;Sit to/from stand   Upper Body Dressing : Minimal assistance;Sitting (lines)   Lower Body Dressing: Sit to/from stand;Moderate assistance   Toilet Transfer: Minimal assistance;Ambulation;RW (w/c for transfer to floor)   Toileting- Clothing Manipulation and Hygiene: Minimal assistance;Sit to/from stand         General ADL Comments: Pt ambulated to sink and brushed her teeth:  she had just used commode prior to OT.  BP dropped from 106/ to 89/62 when sitting, then rose to 113/98 when standing.  No dizziness but pt feels weak     Vision     Perception     Praxis       Pertinent Vitals/Pain Pain Assessment: 0-10 Pain Score: 4  Pain Location: neck and shoulders Pain Descriptors / Indicators: Tightness Pain Intervention(s): Limited activity within patient's tolerance;Monitored during session;Premedicated before session;Repositioned;Ice applied     Hand Dominance     Extremity/Trunk Assessment Upper Extremity Assessment Upper Extremity Assessment: Generalized weakness       Cervical / Trunk Assessment Cervical / Trunk Assessment: Kyphotic (neck tight; able to turn 1/4 turn bil)   Communication Communication Communication: No difficulties   Cognition Arousal/Alertness: Awake/alert Behavior During Therapy: WFL for tasks assessed/performed;Impulsive Overall Cognitive Status: Within Functional Limits for tasks assessed                     General Comments       Exercises       Shoulder Instructions      Home Living Family/patient expects to be discharged to:: Private residence Living Arrangements: Spouse/significant other Available Help at Discharge: Family;Available PRN/intermittently                         Home Equipment: Walker - 2 wheels;Bedside commode          Prior Functioning/Environment Level of Independence: Independent  OT Problem List: Decreased strength;Decreased activity tolerance;Impaired balance (sitting and/or standing);Pain;Decreased knowledge of use of DME or AE   OT Treatment/Interventions: Self-care/ADL training;DME and/or AE instruction;Energy conservation;Patient/family education;Balance training;Therapeutic activities    OT Goals(Current goals can be found in the care plan section) Acute Rehab OT Goals Patient Stated Goal: Walk independently OT Goal Formulation: With patient/family Time For Goal Achievement: 12/05/16 Potential to Achieve Goals: Good ADL Goals Pt Will Perform Grooming: with supervision;standing Pt Will Perform Lower Body Bathing: with  supervision;sit to/from stand Pt Will Perform Lower Body Dressing: with supervision;sit to/from stand Pt Will Transfer to Toilet: with supervision;ambulating;bedside commode Pt Will Perform Toileting - Clothing Manipulation and hygiene: with supervision;sit to/from stand  OT Frequency: Min 2X/week   Barriers to D/C:            Co-evaluation              End of Session    Activity Tolerance: Patient tolerated treatment well Patient left: with family/visitor present;with nursing/sitter in room (w/c)   Time: [id number] OT Time Calculation (min): 20 min Charges:  OT General Charges $OT Visit: 1 Procedure OT Evaluation $OT Eval Low Complexity: 1 Procedure G-Codes:    [place] Deupree Dec 04, 2016, 11:09 AM Lesle Chris, OTR/L [phone] 2016-12-04

## 2016-11-21 NOTE — Progress Notes (Signed)
Pt sleeping for long intervals, husband at bedside. Danielle Gaines ambulated to the bathroom with assist. Not steady on feed.

## 2016-11-21 NOTE — Progress Notes (Signed)
PROGRESS NOTE  Danielle Gaines  [id number] DOB: [birth year] DOA: 11/19/2016 PCP: Lucretia Kern., DO  Outpatient Specialists: Dr. Earlean Shawl, GI  Brief Narrative: Danielle Gaines is a 75 y.o. female with a complicated history of MDD, IBS, migraines, orthostatic hypotension, chronic pain, and hypercalcemia sent to the ED 12/22 from hospital follow up appointment with PCP.   She was discharged 12/13 after an admission for sepsis due to Klebsiella bacteremia which was sensitive to cipro. She completed this course, though her husband reports she has continued to feel very weak, having fallen due to weakness. She's also been more short of breath, a chronic problem for her, with AMS worsening over the past 2 days. She reports diffuse pain in her abdomen at about baseline, aching, constant, moderate. She's only been eating soup and drinking water and boost only since discharge and endorses chronic diarrhea, though this is improved from her baseline thought to be due to IBS. On arrival to ED, SBP was in 80's, she was afebrile appearing dehydrated. Initial work up significant for Na 123, Cr 2.7 with elevated BUN, bicarbonate of 10. There was a leukocytosis (WBC 18.9) and polycythemia (hgb 16), but normal lactate. CXR and [place] without evidence of infection. She was given IV fluid boluses and infusion with isotonic bicarbonate with appropriate improvement in sodium and acidosis. Next day sodium was 130, bicarbonate 13. Topamax was stopped due to concern of its contribution to acidosis and poor appetite.  Assessment & Plan: Active Problems:   Anxiety and depression - managed at [facility]   GERD (gastroesophageal reflux disease)   MDD (major depressive disorder), recurrent severe, without psychosis ([place])   Malnutrition of moderate degree   Acute hyponatremia  Hyponatremia: Presumed acute (last wnl at discharge 12/13) due to decreased solute and increased free water intake (at least 64 fl oz water daily with soup  and boost). Resolved. - Continue isotonic bicarb and push po. - Urine osmolality 234 (would expect < 100 with normal kidney function), urine sodium inappropriately high at 59.   Hypokalemia: K 3.1 - Replete PO and recheck in AM  GERD/IBS: Chronic, stable. Denies dysphagia/odynophagia.  - Continue PPI therapy.  - Continue zofran - Hold hyoscyamine due to concern for anticholinergic effects in elderly pt.  - With ongoing abdominal pain, considering checking CT abd/pelvis. Cr 1.4. No peritoneal signs/pain at baseline so no indication for urgent scan.  AKI with metabolic acidosis: SCr 2.7 on admission, presumed baseline 0.8 - 1.2. Acidosis resolved.   - Continue IV fluids at modest rate (50cc/hr) with bicarbonate until recheck and is stable.  - BMP in AM to monitor renal function, electrolytes and acidosis.   Acute encephalopathy: Oriented, nonfocal, so no indication for neuroimaging after recently negative CT head. Suspect due to dehydration, hyponatremia, and mild uremia w/BUN 61.  - No focal deficits to explain weakness/lassitude, has demonstrated significant variability day-to-day. Suspect element of delirium.  - Polypharmacy thought to be contributing:   - Will DC skelaxin (already held robaxin)  - Decrease protriptyline 20mg  TID > 10mg  TID  - Trazodone changed 50mg  qHS > 25mg  qHS prn (has been held due to sedation)  - Have been holding topamax as above  - Hold hyoscyamine as above  Moderate malnutrition/failure to thrive/diffuse weakness vs. 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 SNF was recommended following last admission, though this was declined by pt and husband. I suspect this would be the best disposition from this hospitalization as well and recommended  considering this at admission.  - Restart protein supplementation - PT/OT evaluation: HH-PT recommended.  - CSW consulted  Leukocytosis: Suspect hemoconcentrated without  evident signs/symptoms of infection not attributable to dehydration. CXR and UA neg. Resolved. - Blood cultures x2 (12/22), urine culture non-clonal (12/22) - IVF's - Hold antimicrobials  Psych: Resume medications she takes, will hold adderall as this is not a regular med for her and will diminish appetite.  - Continue olanzapine, fluoxetine, decrease protriptyline, and change trazodone to prn. Hold for sedation.  - Holding topiramate due to acidosis. - Monitor for serotonin syndrome   DVT prophylaxis: Heparin Code Status: Full Family Communication: None at bedside this AM Disposition Plan: Transfer to tele today, anticipate DC to home with home health in next 2-3 days if weakness improves reliably.   Consultants:   None  Procedures:   None  Antimicrobials:  None   Subjective: Pt visibly drained, tired today. Got up without issues yesterday with PT. Slept most of yesterday afterward and overnight. Abd pain at baseline, no vomiting since admission.  Objective: Vitals:   11/21/16 0010 11/21/16 0200 11/21/16 0400 11/21/16 0600  BP:  (!) 100/40 (!) 114/44 (!) 123/45  Pulse:      Resp:  14 13 12   Temp: 97.4 F (36.3 C)  97.9 F (36.6 C)   TempSrc: Oral  Oral   SpO2:  97% 99% 100%  Weight:      Height:        Intake/Output Summary (Last 24 hours) at 11/21/16 [id number] Last data filed at 11/21/16 0600  Gross per 24 hour  Intake          2028.33 ml  Output              350 ml  Net          1678.33 ml   Filed Weights   11/19/16 1930  Weight: 50 kg (110 lb 3.7 oz)   Examination: General exam: Tired, frail elderly female in no distress Respiratory system: Non-labored breathing room air. Clear to auscultation bilaterally.  Cardiovascular system: Regular rate and rhythm. No murmur, rub, or gallop. No JVD, and no pedal edema. Gastrointestinal system: Abdomen soft, diffusely mildly tender without rebound, non-distended, with normoactive bowel sounds. No organomegaly or masses  felt. Central nervous system: Alert and oriented. No focal neurological deficits Diffusely weak which improves with significant encouragement for increased effort.  Extremities: Warm, no deformities Skin: No rashes, lesions, ulcers Psychiatry: Judgement and insight appear normal. Mood & affect appropriate.   Data Reviewed: I have personally reviewed following labs and imaging studies  CBC:  Recent Labs Lab 11/19/16 1506 11/19/16 1531 11/19/16 2027 11/20/16 0057 11/21/16 0334  WBC 18.9*  --  14.9* 9.3 6.9  NEUTROABS 16.6*  --   --   --   --   HGB 14.4 16.0* 14.6 12.9 12.2  HCT 40.9 47.0* 41.2 36.2 34.4*  MCV 88.5  --  88.6 87.7 88.0  PLT 460*  --  472* 401* [id number]*   Basic Metabolic Panel:  Recent Labs Lab 11/19/16 2027 11/19/16 2130 11/20/16 0057 11/20/16 0659 11/20/16 1315 11/21/16 0334  NA 130*  --  130* 136 134* 135  K 2.5*  --  2.4* 3.7 4.8 3.1*  CL 109  --  106 109 106 105  CO2 9*  --  13* 17* 20* 22  GLUCOSE 145*  --  111* 108* 122* 101*  BUN 56*  --  50* 40* 38* 37*  CREATININE  1.97*  2.07*  --  1.62* 1.28* 1.40* 1.41*  CALCIUM 8.0*  --  7.9* 8.0* 9.9 9.0  MG  --  2.0  --   --   --   --    GFR: Estimated Creatinine Clearance: 26.8 mL/min (by C-G formula based on SCr of 1.41 mg/dL (H)). Liver Function Tests:  Recent Labs Lab 11/19/16 1506  AST 22  ALT 34  ALKPHOS 129*  BILITOT 0.4  PROT 7.6  ALBUMIN 3.7   No results for input(s): LIPASE, AMYLASE in the last 168 hours. No results for input(s): AMMONIA in the last 168 hours. Coagulation Profile:  Recent Labs Lab 11/19/16 1506  INR 1.07   Cardiac Enzymes: No results for input(s): CKTOTAL, CKMB, CKMBINDEX, TROPONINI in the last 168 hours. BNP (last 3 results) No results for input(s): PROBNP in the last 8760 hours. HbA1C: No results for input(s): HGBA1C in the last 72 hours. CBG: No results for input(s): GLUCAP in the last 168 hours. Lipid Profile: No results for input(s): CHOL, HDL, LDLCALC,  TRIG, CHOLHDL, LDLDIRECT in the last 72 hours. Thyroid Function Tests: No results for input(s): TSH, T4TOTAL, FREET4, T3FREE, THYROIDAB in the last 72 hours. Anemia Panel: No results for input(s): VITAMINB12, FOLATE, FERRITIN, TIBC, IRON, RETICCTPCT in the last 72 hours. Urine analysis:    Component Value Date/Time   COLORURINE STRAW (A) 11/19/2016 1500   APPEARANCEUR CLEAR 11/19/2016 1500   LABSPEC 1.005 11/19/2016 1500   PHURINE 5.0 11/19/2016 1500   GLUCOSEU NEGATIVE 11/19/2016 1500   GLUCOSEU NEGATIVE 09/17/2016 1131   HGBUR NEGATIVE 11/19/2016 1500   BILIRUBINUR NEGATIVE 11/19/2016 1500   BILIRUBINUR negative 09/17/2016 1113   KETONESUR NEGATIVE 11/19/2016 1500   PROTEINUR NEGATIVE 11/19/2016 1500   UROBILINOGEN 0.2 09/17/2016 1131   NITRITE NEGATIVE 11/19/2016 1500   LEUKOCYTESUR NEGATIVE 11/19/2016 1500   Radiology Studies: Dg Chest Port 1 View  Result Date: 11/19/2016 CLINICAL DATA:  Hypotension EXAM: PORTABLE CHEST 1 VIEW COMPARISON:  11/06/2016 FINDINGS: The heart size and mediastinal contours are within normal limits. Both lungs are clear. The visualized skeletal structures are unremarkable. IMPRESSION: No active disease. Electronically Signed   By: Franchot Gallo M.D.   On: 11/19/2016 15:09    Scheduled Meds: . cycloSPORINE  1 drop Both Eyes BID  . feeding supplement (ENSURE ENLIVE)  237 mL Oral BID BM  . heparin  5,000 Units Subcutaneous Q8H  . hyoscyamine  0.375 mg Oral BID  . linaclotide  290 mcg Oral QAC breakfast  . lipase/protease/amylase  36,000 Units Oral TID AC  . OLANZapine-FLUoxetine  1 capsule Oral QHS  . pantoprazole  80 mg Oral Daily  . protriptyline  20 mg Oral TID  . sodium chloride flush  3 mL Intravenous Q12H  . traZODone  50 mg Oral QHS   Continuous Infusions: .  sodium bicarbonate 150 mEq in sterile water 1000 mL infusion 50 mL/hr at 11/20/16 2233     LOS: 2 days   Time spent: 25 minutes.  Vance Gather, MD [facility] Pager  [phone]  If 7PM-7AM, please contact night-coverage [email or web address] Password [email or web address] 11/21/2016, 8:11 AM

## 2016-11-22 DIAGNOSIS — K219 Gastro-esophageal reflux disease without esophagitis: Secondary | ICD-10-CM

## 2016-11-22 LAB — CBC
HCT: 34.9 % — ABNORMAL LOW (ref 36.0–46.0)
Hemoglobin: 12.3 g/dL (ref 12.0–15.0)
MCH: 31.5 pg (ref 26.0–34.0)
MCHC: 35.2 g/dL (ref 30.0–36.0)
MCV: 89.3 fL (ref 78.0–100.0)
Platelets: 435 10*3/uL — ABNORMAL HIGH (ref 150–400)
RBC: 3.91 MIL/uL (ref 3.87–5.11)
RDW: 12.7 % (ref 11.5–15.5)
WBC: 6.5 10*3/uL (ref 4.0–10.5)

## 2016-11-22 LAB — BASIC METABOLIC PANEL
Anion gap: 9 (ref 5–15)
BUN: 27 mg/dL — ABNORMAL HIGH (ref 6–20)
CO2: 22 mmol/L (ref 22–32)
Calcium: 8.9 mg/dL (ref 8.9–10.3)
Chloride: 104 mmol/L (ref 101–111)
Creatinine, Ser: 1.12 mg/dL — ABNORMAL HIGH (ref 0.44–1.00)
GFR calc Af Amer: 55 mL/min — ABNORMAL LOW (ref >60)
GFR calc non Af Amer: 48 mL/min — ABNORMAL LOW (ref >60)
Glucose, Bld: 96 mg/dL (ref 65–99)
Potassium: 3 mmol/L — ABNORMAL LOW (ref 3.5–5.1)
Sodium: 135 mmol/L (ref 135–145)

## 2016-11-22 MED ORDER — POTASSIUM CHLORIDE CRYS ER 20 MEQ PO TBCR
40.0000 meq | EXTENDED_RELEASE_TABLET | Freq: Two times a day (BID) | ORAL | Status: AC
  Administered 2016-11-22 (×2): 40 meq via ORAL
  Filled 2016-11-22 (×2): qty 2

## 2016-11-22 NOTE — Progress Notes (Signed)
Patient ambulated in the hallway x 1, walked with a walker and standby assist. Will continue to monitor.

## 2016-11-22 NOTE — Progress Notes (Signed)
PROGRESS NOTE  Danielle Gaines  [id number] DOB: Feb 13, 1941 DOA: 11/19/2016 PCP: Lucretia Kern., DO  Outpatient Specialists: Dr. Earlean Shawl, GI  Brief Narrative: Danielle Gaines is a 75 y.o. female with a complicated history of MDD, IBS, migraines, orthostatic hypotension, chronic pain, and hypercalcemia sent to the ED 12/22 from hospital follow up appointment with PCP.   She was discharged 12/13 after an admission for sepsis due to Klebsiella bacteremia which was sensitive to cipro. She completed this course, though her husband reports she has continued to feel very weak, having fallen due to weakness. She's also been more short of breath, a chronic problem for her, with AMS worsening over the past 2 days. She reports diffuse pain in her abdomen at about baseline, aching, constant, moderate. She's only been eating soup and drinking water and boost only since discharge and endorses chronic diarrhea, though this is improved from her baseline thought to be due to IBS. On arrival to ED, SBP was in 80's, she was afebrile appearing dehydrated. Initial work up significant for Na 123, Cr 2.7 with elevated BUN, bicarbonate of 10. There was a leukocytosis (WBC 18.9) and polycythemia (hgb 16), but normal lactate. CXR and UA without evidence of infection. She was given IV fluid boluses and infusion with isotonic bicarbonate with appropriate improvement in sodium and resolution of acidosis. Diffuse weakness continued despite this, so sedating medications were tapered as able. Topamax was stopped due to concern of its contribution to acidosis and poor appetite. Physical and occupational therapy worked with the patient and recommended home health PT and OT.   Assessment & Plan: Active Problems:   Anxiety and depression - managed at [facility]   GERD (gastroesophageal reflux disease)   MDD (major depressive disorder), recurrent severe, without psychosis ([place])   Malnutrition of moderate degree   Acute  hyponatremia  Hyponatremia: Presumed acute (last wnl at discharge 12/13) due to decreased solute and increased free water intake (at least 64 fl oz water daily with soup and boost). Resolved. - Continue isotonic bicarb and push po. - Urine osmolality 234 (would expect < 100 with normal kidney function), urine sodium inappropriately high at 59.   Hypokalemia: K 3.1 - Replete PO and recheck in AM  GERD/IBS: Chronic, stable. Denies dysphagia/odynophagia.  - Continue PPI therapy.  - Continue zofran - Hold hyoscyamine due to concern for anticholinergic effects in elderly pt.  - With ongoing abdominal pain, considering checking CT abd/pelvis. Cr 1.4. No peritoneal signs/pain at baseline so no indication for urgent scan.  AKI with metabolic acidosis: SCr 2.7 on admission, presumed baseline 0.8 - 1.2. Acidosis resolved.   - Continue IV fluids at modest rate (50cc/hr) with bicarbonate until recheck and is stable.  - BMP in AM to monitor renal function, electrolytes and acidosis.   Acute encephalopathy: Oriented, nonfocal, so no indication for neuroimaging after recently negative CT head. Suspect due to dehydration, hyponatremia, and mild uremia w/BUN 61.  - No focal deficits to explain weakness/lassitude, has demonstrated significant variability day-to-day. Suspect element of delirium.  - Polypharmacy thought to be contributing: Tolerating these tapers/holds  - Will DC skelaxin (already held robaxin)  - Decrease protriptyline 20mg  TID > 10mg  TID  - Trazodone changed 50mg  qHS > 25mg  qHS prn (has been held due to sedation)  - Have been holding topamax as above  - Hold hyoscyamine as above  Moderate malnutrition/failure to thrive/diffuse weakness vs. 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 SNF was recommended following  last admission, though this was declined by pt and husband. I suspect this would be the best disposition from this hospitalization as  well and recommended considering this at admission.  - Restart protein supplementation - PT/OT evaluation: HH-PT recommended.  - CM consulted  Leukocytosis: Suspect hemoconcentrated without evident signs/symptoms of infection not attributable to dehydration. CXR and UA neg. Resolved. - Blood cultures x2 (12/22), urine culture non-clonal (12/22) - IVF's - Hold antimicrobials  Psych: Resume medications she takes, will hold adderall as this is not a regular med for her and will diminish appetite.  - Continue olanzapine, fluoxetine, decrease protriptyline, and change trazodone to prn. Hold for sedation.  - Monitor for serotonin syndrome    Migraines:  - Holding topiramate due to acidosis. Monitor.   DVT prophylaxis: Heparin Code Status: Full Family Communication: None at bedside this AM Disposition Plan: Pending clinical improvement. Still requires some IV fluids due to poor po. Pt was a readmission due to failure to resolve weakness, so I'm reluctant to discharge at this time. Anticipate DC to home with home health in next 2-3 days if weakness improves reliably.   Consultants:   None  Procedures:   None  Antimicrobials:  None   Subjective: Pt tired again today, slept much of yesterday and overnight. Abd pain at baseline, poor po, no vomiting since admission.  Objective: Vitals:   11/21/16 1344 11/21/16 2047 11/22/16 0420 11/22/16 0521  BP: 118/68 (!) 110/48 119/76   Pulse: 88 (!) 104 88   Resp: 16 18 16    Temp:  98 F (36.7 C) 97.9 F (36.6 C)   TempSrc:  Oral Oral   SpO2: 97% 97% 99%   Weight:    51 kg (112 lb 7 oz)  Height:        Intake/Output Summary (Last 24 hours) at 11/22/16 0935 Last data filed at 11/21/16 1206  Gross per 24 hour  Intake              665 ml  Output                0 ml  Net              665 ml   Filed Weights   11/19/16 1930 11/22/16 0521  Weight: 50 kg (110 lb 3.7 oz) 51 kg (112 lb 7 oz)   Examination: General exam: Tired, frail  elderly female in no distress Respiratory system: Non-labored breathing room air. Clear to auscultation bilaterally.  Cardiovascular system: Regular rate and rhythm. No murmur, rub, or gallop. No JVD, and no pedal edema. Gastrointestinal system: Abdomen soft, diffusely mildly tender without rebound unchanged from previous exams, non-distended, with normoactive bowel sounds. No organomegaly or masses felt. Central nervous system: Alert and oriented. No focal neurological deficits Diffusely weak which improves with significant encouragement for increased effort.  Extremities: Warm, no deformities Skin: No rashes, lesions, ulcers Psychiatry: Judgement and insight appear normal. Mood & affect appropriate.   Data Reviewed: I have personally reviewed following labs and imaging studies  CBC:  Recent Labs Lab 11/19/16 1506 11/19/16 1531 11/19/16 2027 11/20/16 0057 11/21/16 0334 11/22/16 0359  WBC 18.9*  --  14.9* 9.3 6.9 6.5  NEUTROABS 16.6*  --   --   --   --   --   HGB 14.4 16.0* 14.6 12.9 12.2 12.3  HCT 40.9 47.0* 41.2 36.2 34.4* 34.9*  MCV 88.5  --  88.6 87.7 88.0 89.3  PLT 460*  --  472*  401* 426* [id number]*   Basic Metabolic Panel:  Recent Labs Lab 11/19/16 2130 11/20/16 0057 11/20/16 0659 11/20/16 1315 11/21/16 0334 11/22/16 0359  NA  --  130* 136 134* 135 135  K  --  2.4* 3.7 4.8 3.1* 3.0*  CL  --  106 109 106 105 104  CO2  --  13* 17* 20* 22 22  GLUCOSE  --  111* 108* 122* 101* 96  BUN  --  50* 40* 38* 37* 27*  CREATININE  --  1.62* 1.28* 1.40* 1.41* 1.12*  CALCIUM  --  7.9* 8.0* 9.9 9.0 8.9  MG 2.0  --   --   --   --   --    GFR: Estimated Creatinine Clearance: 33.8 mL/min (by C-G formula based on SCr of 1.12 mg/dL (H)). Liver Function Tests:  Recent Labs Lab 11/19/16 1506  AST 22  ALT 34  ALKPHOS 129*  BILITOT 0.4  PROT 7.6  ALBUMIN 3.7   No results for input(s): LIPASE, AMYLASE in the last 168 hours. No results for input(s): AMMONIA in the last 168  hours. Coagulation Profile:  Recent Labs Lab 11/19/16 1506  INR 1.07   Cardiac Enzymes: No results for input(s): CKTOTAL, CKMB, CKMBINDEX, TROPONINI in the last 168 hours. BNP (last 3 results) No results for input(s): PROBNP in the last 8760 hours. HbA1C: No results for input(s): HGBA1C in the last 72 hours. CBG: No results for input(s): GLUCAP in the last 168 hours. Lipid Profile: No results for input(s): CHOL, HDL, LDLCALC, TRIG, CHOLHDL, LDLDIRECT in the last 72 hours. Thyroid Function Tests: No results for input(s): TSH, T4TOTAL, FREET4, T3FREE, THYROIDAB in the last 72 hours. Anemia Panel: No results for input(s): VITAMINB12, FOLATE, FERRITIN, TIBC, IRON, RETICCTPCT in the last 72 hours. Urine analysis:    Component Value Date/Time   COLORURINE STRAW (A) 11/19/2016 1500   APPEARANCEUR CLEAR 11/19/2016 1500   LABSPEC 1.005 11/19/2016 1500   PHURINE 5.0 11/19/2016 1500   GLUCOSEU NEGATIVE 11/19/2016 1500   GLUCOSEU NEGATIVE 09/17/2016 1131   HGBUR NEGATIVE 11/19/2016 1500   BILIRUBINUR NEGATIVE 11/19/2016 1500   BILIRUBINUR negative 09/17/2016 1113   KETONESUR NEGATIVE 11/19/2016 1500   PROTEINUR NEGATIVE 11/19/2016 1500   UROBILINOGEN 0.2 09/17/2016 1131   NITRITE NEGATIVE 11/19/2016 1500   LEUKOCYTESUR NEGATIVE 11/19/2016 1500   Radiology Studies: No results found.  Scheduled Meds: . cycloSPORINE  1 drop Both Eyes BID  . feeding supplement (ENSURE ENLIVE)  237 mL Oral BID BM  . heparin  5,000 Units Subcutaneous Q8H  . hyoscyamine  0.375 mg Oral BID  . linaclotide  290 mcg Oral QAC breakfast  . lipase/protease/amylase  36,000 Units Oral TID AC  . OLANZapine-FLUoxetine  1 capsule Oral QHS  . pantoprazole  80 mg Oral Daily  . potassium chloride  40 mEq Oral BID  . protriptyline  10 mg Oral TID  . sodium chloride flush  3 mL Intravenous Q12H   Continuous Infusions: .  sodium bicarbonate 150 mEq in sterile water 1000 mL infusion 50 mL/hr at 11/21/16 2017      LOS: 3 days   Time spent: 25 minutes.  Vance Gather, MD [facility] Pager [phone]  If 7PM-7AM, please contact night-coverage [email or web address] Password [facility] 11/22/2016, 9:35 AM

## 2016-11-23 ENCOUNTER — Other Ambulatory Visit: Payer: Self-pay | Admitting: Family Medicine

## 2016-11-23 LAB — BASIC METABOLIC PANEL
Anion gap: 8 (ref 5–15)
BUN: 23 mg/dL — ABNORMAL HIGH (ref 6–20)
CO2: 24 mmol/L (ref 22–32)
Calcium: 9.1 mg/dL (ref 8.9–10.3)
Chloride: 101 mmol/L (ref 101–111)
Creatinine, Ser: 1.12 mg/dL — ABNORMAL HIGH (ref 0.44–1.00)
GFR calc Af Amer: 55 mL/min — ABNORMAL LOW (ref >60)
GFR calc non Af Amer: 48 mL/min — ABNORMAL LOW (ref >60)
Glucose, Bld: 103 mg/dL — ABNORMAL HIGH (ref 65–99)
Potassium: 3.5 mmol/L (ref 3.5–5.1)
Sodium: 133 mmol/L — ABNORMAL LOW (ref 135–145)

## 2016-11-23 MED ORDER — PROTRIPTYLINE HCL 10 MG PO TABS: 10.0000 mg | ORAL_TABLET | Freq: Three times a day (TID) | ORAL | 0 refills | Status: DC

## 2016-11-23 MED ORDER — TRAZODONE HCL 50 MG PO TABS: 50.0000 mg | ORAL_TABLET | Freq: Every evening | ORAL | Status: DC | PRN

## 2016-11-23 MED ORDER — ONDANSETRON HCL 4 MG PO TABS: 4.0000 mg | ORAL_TABLET | Freq: Three times a day (TID) | ORAL | 0 refills | Status: DC | PRN

## 2016-11-23 MED ORDER — POTASSIUM CHLORIDE ER 20 MEQ PO TBCR: 20.0000 meq | EXTENDED_RELEASE_TABLET | Freq: Every day | ORAL | 0 refills | Status: DC

## 2016-11-23 NOTE — Progress Notes (Signed)
No SNF needs.  Kathrin Greathouse, Latanya Presser, MSW Clinical Social Worker [facility]E and [facility] Service Line [phone] 11/23/2016  11:28 AM

## 2016-11-23 NOTE — Discharge Summary (Signed)
Physician Discharge Summary  Danielle Gaines [id number] DOB: [birth year] DOA: 11/19/2016  PCP: Lucretia Kern., DO  Admit date: 11/19/2016 Discharge date: 11/23/2016  Admitted From: Home Disposition: Home   Recommendations for Outpatient Follow-up:  1. Follow up with PCP in 1 week 2. Please obtain BMP to particularly monitor sodium and bicarbonate, 133 and 24 at discharge respectively. Creatinine is 1.12 at discharge.  3. Note many medication changes as below to address suspected polypharmacy: - Recommend DC skelaxin and robaxin if possible, otherwise use only one sparingly. - Decrease protriptyline 20mg  TID > 10mg  TID - Trazodone changed 50mg  qHS > 50mg  qHS prn (has been held due to sedation) - Have been holding topamax due to concern for contribution to poor po intake/acidosis. - Holding hyoscyamine due to anticholinergic effects.  Home Health: Patient again declines PT/OT services Equipment/Devices: None new, has RW and 3in1 at home  Discharge Condition: Stable CODE STATUS: Full Diet recommendation: As tolerated  Brief/Interim Summary: Danielle Gaines a 75 y.o.femalewith a complicated history of MDD, IBS, migraines, orthostatic hypotension, chronic pain, and hypercalcemia sent to the ED 12/22 from hospital follow up appointment with PCP.   She was discharged 12/13 after an admission for sepsis due to Klebsiella bacteremia which was sensitive to cipro. She completed this course, though her husband reports she has continued to feel very weak, having fallen due to weakness. She's also been more short of breath, a chronic problem for her, with AMS worsening over the past 2 days. She reports diffuse pain in her abdomen at about baseline, aching, constant, moderate. She's only been eating soup and drinking water and boost only since discharge and endorses chronic diarrhea, though this is improved from her baseline thought to be due to IBS. On arrival to ED, SBP was in 80's, she was  afebrile appearing dehydrated. Initial work up significant for Na 123, Cr 2.7 with elevated BUN, bicarbonate of 10. There was a leukocytosis (WBC 18.9) and polycythemia (hgb 16), but normal lactate. CXR and UA without evidence of infection. She was given IV fluid boluses and infusion with isotonic bicarbonate with appropriate improvement in sodium and resolution of acidosis. Diffuse weakness continued despite this, so sedating medications were tapered as able. Topamax was stopped due to concern of its contribution to acidosis and poor appetite. Physical and occupational therapy worked with the patient and recommended home health PT and OT. On day of discharge she ambulated around [place] without issues, and wanted to go home.   Discharge Diagnoses:  Active Problems:   Anxiety and depression - managed at [facility]   GERD (gastroesophageal reflux disease)   MDD (major depressive disorder), recurrent severe, without psychosis ([place])   Malnutrition of moderate degree   Acute hyponatremia  Hyponatremia: Presumed acute (last wnl at discharge 12/13) due to decreased solute and increased free water intake (at least 64 fl oz water daily with soup and boost). Resolved. - Weaned off bicarb drip with resolution of acidosis. Na stable 135>135>130 - Urine osmolality 234 (would expect < 100 with normal kidney function), urine sodium inappropriately high at 59.  Hypokalemia: K 2.5 on admission, corrected with po repletion.  - Will prescribe daily potassium until follow up.  GERD/IBS: Chronic, stable. Denies dysphagia/odynophagia.  - Continue PPI therapy.  - Continue zofran - Hold hyoscyamine due to concern for anticholinergic effects in elderly pt.  - With ongoing abdominal pain, considering checking CT abd/pelvis. Cr 1.4. No peritoneal signs/pain at baseline so no indication for urgent scan.  AKI with metabolic acidosis: SCr 2.7 on admission, presumed baseline 0.8 - 1.2. Acidosis resolved.   - Creatinine  1.12 at discharge  Acute encephalopathy: Oriented, nonfocal, so no indication for neuroimaging after recently negative CT head. Suspect due to dehydration, hyponatremia, and mild uremia w/BUN 61.  - No focal deficits to explain weakness/lassitude, has demonstrated significant variability day-to-day. Suspect element of delirium.  - Polypharmacy thought to be contributing: Tolerating these tapers/holds             - Will DC skelaxin (already held robaxin)             - Decrease protriptyline 20mg  TID > 10mg  TID             - Trazodone changed 50mg  qHS > 25mg  qHS prn (has been held due to sedation)             - Have been holding topamax as above             - Hold hyoscyamine as above  Moderate malnutrition/failure to thrive/diffuse weakness vs. 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 SNF was recommended following last admission, though this was declined by pt and husband. I suspect this would be the best disposition from this hospitalization as well and recommended considering this at admission.  - Restart protein supplementation - PT/OT evaluation: HH-PT recommended.  - CM consulted  Leukocytosis:Suspect hemoconcentrated without evident signs/symptoms of infection not attributable to dehydration. CXR and UA neg. Resolved. - Blood cultures x2 (12/22), urine culture non-clonal (12/22) - IVF's - Hold antimicrobials  Psych: Resume medications she takes, will hold adderall as this is not a regular med for her and will diminish appetite.  - Continue olanzapine, fluoxetine, decrease protriptyline, and change trazodone to prn. Hold for sedation.  - Monitor for serotonin syndrome    Migraines:  - Holding topiramate due to acidosis. Monitor.   Discharge Instructions Discharge Instructions    Call MD for:  difficulty breathing, headache or visual disturbances    Complete by:  As directed    Call MD for:  extreme fatigue    Complete by:  As  directed    Call MD for:  persistant dizziness or light-headedness    Complete by:  As directed    Call MD for:  persistant nausea and vomiting    Complete by:  As directed    Call MD for:  temperature >100.4    Complete by:  As directed    Diet - low sodium heart healthy    Complete by:  As directed    Discharge instructions    Complete by:  As directed    You were admitted with weakness and found to have low sodium and acidosis. These resolved, but you remained weak, so some medications were changed. You have since improved and are stable for discharge today with the following recommendations: - You are currently prescribed 2 muscle relaxers, both of which are sedatives: skelaxin and robaxin. It is recommended that you stop both of these if able, but certainly only take one of them if absolutely necessary.  - Stop taking hyoscyamine as this is worsening your dry mouth and can contribute to falls/weakness.  - Stop taking topamax as this is associated with the changes in your labs that were seen and may diminish your appetite - Decrease vivactil to only 10mg  three times daily - Take trazodone at night for sleep only if needed. You've been sleeping much here,  so this has not been given.  - Please see your PCP in the next few days just to check up. You will also need to follow up with psychiatry and gastroenterology and make sure they're aware of these changes. They will likely want to continue managing these medications.  - If your symptoms return, please seek medical care urgently.   Increase activity slowly    Complete by:  As directed      Allergies as of 11/23/2016      Reactions   Sulfa Antibiotics Swelling, Other (See Comments)   Reaction:  Unspecified swelling reaction       Medication List    STOP taking these medications   hyoscyamine 0.375 MG 12 hr tablet Commonly known as:  LEVBID   metaxalone 800 MG tablet Commonly known as:  SKELAXIN   methocarbamol 500 MG  tablet Commonly known as:  ROBAXIN   topiramate 100 MG tablet Commonly known as:  TOPAMAX     TAKE these medications   acetaminophen 500 MG tablet Commonly known as:  TYLENOL Take 1,000 mg by mouth every 6 (six) hours as needed for mild pain, moderate pain or headache.   ADDERALL XR 30 MG 24 hr capsule Generic drug:  amphetamine-dextroamphetamine Take 30 mg by mouth daily.   alendronate 70 MG tablet Commonly known as:  FOSAMAX Take 70 mg by mouth every Saturday. Take with a full glass of water on an empty stomach.   Biotin 1000 MCG tablet Take 1,000 mcg by mouth daily.   cycloSPORINE 0.05 % ophthalmic emulsion Commonly known as:  RESTASIS Place 1 drop into both eyes 2 (two) times daily.   LINZESS 290 MCG Caps capsule Generic drug:  linaclotide Take 290 mcg by mouth daily before breakfast.   OLANZapine-FLUoxetine 3-25 MG capsule Commonly known as:  SYMBYAX Take 1 capsule by mouth at bedtime.   omeprazole 40 MG capsule Commonly known as:  PRILOSEC Take 40 mg by mouth daily.   ondansetron 4 MG tablet Commonly known as:  ZOFRAN Take 1 tablet (4 mg total) by mouth every 8 (eight) hours as needed for nausea or vomiting.   protriptyline 10 MG tablet Commonly known as:  VIVACTIL Take 1 tablet (10 mg total) by mouth 3 (three) times daily. What changed:  how much to take   SUMAtriptan 100 MG tablet Commonly known as:  IMITREX Take 100 mg by mouth every 2 (two) hours as needed for migraine.   traZODone 50 MG tablet Commonly known as:  DESYREL Take 1 tablet (50 mg total) by mouth at bedtime as needed for sleep. What changed:  when to take this  reasons to take this   ZENPEP 15000 units Cpep Generic drug:  Pancrelipase (Lip-Prot-Amyl) Take 45,000 mg by mouth 3 (three) times daily before meals.      Follow-up Information    Colin Benton R., DO. Schedule an appointment as soon as possible for a visit.   Specialty:  Family Medicine Contact information: [place] [address] [phone]        MEDOFF,JEFFREY R, MD Follow up.   Specialty:  Gastroenterology Contact information: [place] [address] [phone]          Allergies  Allergen Reactions  . Sulfa Antibiotics Swelling and Other (See Comments)    Reaction:  Unspecified swelling reaction     Consultations:  None  Procedures/Studies: Dg Chest 2 View  Result Date: 11/06/2016 CLINICAL DATA:  Fever. EXAM: CHEST  2 VIEW COMPARISON:  November 04, 2016 FINDINGS: The heart size and mediastinal contours are within normal limits. Both lungs are clear. The visualized skeletal structures are unremarkable. IMPRESSION: No active cardiopulmonary disease. Electronically Signed   By: Dorise Bullion III M.D   On: 11/06/2016 13:13   Dg Chest 2 View  Result Date: 11/04/2016 CLINICAL DATA:  Mid chest pain radiating into the back over the past several days. Status post fall a few days ago. EXAM: CHEST  2 VIEW COMPARISON:  PA and lateral chest 06/13/2015. FINDINGS: Small focus of linear scar is seen in the anterior right upper lobe. Lungs are otherwise clear. Heart size is normal. No pneumothorax or pleural effusion. Heart size is normal. No acute bony abnormality. IMPRESSION: No acute disease. Electronically Signed   By: Inge Rise M.D.   On: 11/04/2016 10:30   Ct Head Wo Contrast  Result Date: 11/04/2016 CLINICAL DATA:  Fall 2 days ago.  Headache. EXAM: CT HEAD WITHOUT CONTRAST TECHNIQUE: Contiguous axial images were obtained from the base of the skull through the vertex without intravenous contrast. COMPARISON:  04/07/2015 FINDINGS: Brain: No acute intracranial abnormality. Specifically, no hemorrhage, hydrocephalus, mass lesion, acute infarction, or significant intracranial injury. Vascular: No hyperdense vessel or unexpected calcification. Skull: No acute calvarial abnormality Sinuses/Orbits: Visualized paranasal sinuses and mastoids  clear. Orbital soft tissues unremarkable. Other: None IMPRESSION: Normal study. Electronically Signed   By: Rolm Baptise M.D.   On: 11/04/2016 13:17   Dg Chest Port 1 View  Result Date: 11/19/2016 CLINICAL DATA:  Hypotension EXAM: PORTABLE CHEST 1 VIEW COMPARISON:  11/06/2016 FINDINGS: The heart size and mediastinal contours are within normal limits. Both lungs are clear. The visualized skeletal structures are unremarkable. IMPRESSION: No active disease. Electronically Signed   By: Franchot Gallo M.D.   On: 11/19/2016 15:09     Subjective: Patient reports feeling better, more awake per pt and husband. Notes no changes in mood or thinking. Mouth is less dry. No change in abd pain. No nausea or vomiting. Eating well. Ambulating with standby assistance/guarding only. Wants to go home.  Discharge Exam: Vitals:   11/22/16 2052 11/23/16 0437  BP: (!) 97/59 93/60  Pulse: 96 85  Resp: 16 16  Temp: 98.3 F (36.8 C) 97.7 F (36.5 C)   Vitals:   11/22/16 1320 11/22/16 2052 11/23/16 0437 11/23/16 0619  BP: (!) 120/55 (!) 97/59 93/60   Pulse: 100 96 85   Resp: 16 16 16    Temp: 97.2 F (36.2 C) 98.3 F (36.8 C) 97.7 F (36.5 C)   TempSrc: Oral Oral Oral   SpO2: 100% 99% 97%   Weight:    51.9 kg (114 lb 6.7 oz)  Height:       General: Thin, frail 75yo F in no distress Cardiovascular: RRR, S1/S2 +, no rubs, no gallops Respiratory: Nonlabored, CTA bilaterally, no wheezing, no rhonchi Abdominal: Soft, diffusely tender to deep palpation without rebound or guarding, ND, bowel sounds + Extremities: no edema, no cyanosis  The results of significant diagnostics from this hospitalization (including imaging, microbiology, ancillary and laboratory) are listed below for reference.    Microbiology: Recent Results (from the past 240 hour(s))  Urine culture     Status: Abnormal   Collection Time: 11/19/16  2:51 PM  Result Value Ref Range Status   Specimen Description URINE, CLEAN CATCH  Final    Special Requests NONE  Final   Culture MULTIPLE SPECIES PRESENT, SUGGEST RECOLLECTION (A)  Final   Report  Status 11/20/2016 FINAL  Final  Blood culture (routine x 2)     Status: None (Preliminary result)   Collection Time: 11/19/16  6:18 PM  Result Value Ref Range Status   Specimen Description RIGHT ANTECUBITAL  Final   Special Requests BOTTLES DRAWN AEROBIC ONLY 5CC  Final   Culture   Final    NO GROWTH 4 DAYS Performed at [facility]    Report Status PENDING  Incomplete  Blood culture (routine x 2)     Status: None (Preliminary result)   Collection Time: 11/19/16  6:19 PM  Result Value Ref Range Status   Specimen Description RIGHT ANTECUBITAL  Final   Special Requests BOTTLES DRAWN AEROBIC AND ANAEROBIC 5CC  Final   Culture   Final    NO GROWTH 4 DAYS Performed at [facility]    Report Status PENDING  Incomplete     Labs: BNP (last 3 results) No results for input(s): BNP in the last 8760 hours. Basic Metabolic Panel:  Recent Labs Lab 11/19/16 2130  11/20/16 0659 11/20/16 1315 11/21/16 0334 11/22/16 0359 11/23/16 0356  NA  --   < > 136 134* 135 135 133*  K  --   < > 3.7 4.8 3.1* 3.0* 3.5  CL  --   < > 109 106 105 104 101  CO2  --   < > 17* 20* 22 22 24   GLUCOSE  --   < > 108* 122* 101* 96 103*  BUN  --   < > 40* 38* 37* 27* 23*  CREATININE  --   < > 1.28* 1.40* 1.41* 1.12* 1.12*  CALCIUM  --   < > 8.0* 9.9 9.0 8.9 9.1  MG 2.0  --   --   --   --   --   --   < > = values in this interval not displayed. Liver Function Tests:  Recent Labs Lab 11/19/16 1506  AST 22  ALT 34  ALKPHOS 129*  BILITOT 0.4  PROT 7.6  ALBUMIN 3.7   No results for input(s): LIPASE, AMYLASE in the last 168 hours. No results for input(s): AMMONIA in the last 168 hours. CBC:  Recent Labs Lab 11/19/16 1506 11/19/16 1531 11/19/16 2027 11/20/16 0057 11/21/16 0334 11/22/16 0359  WBC 18.9*  --  14.9* 9.3 6.9 6.5  NEUTROABS 16.6*  --   --   --   --   --   HGB 14.4  16.0* 14.6 12.9 12.2 12.3  HCT 40.9 47.0* 41.2 36.2 34.4* 34.9*  MCV 88.5  --  88.6 87.7 88.0 89.3  PLT 460*  --  472* 401* 426* 435*   Cardiac Enzymes: No results for input(s): CKTOTAL, CKMB, CKMBINDEX, TROPONINI in the last 168 hours. BNP: Invalid input(s): POCBNP CBG: No results for input(s): GLUCAP in the last 168 hours. D-Dimer No results for input(s): DDIMER in the last 72 hours. Hgb A1c No results for input(s): HGBA1C in the last 72 hours. Lipid Profile No results for input(s): CHOL, HDL, LDLCALC, TRIG, CHOLHDL, LDLDIRECT in the last 72 hours. Thyroid function studies No results for input(s): TSH, T4TOTAL, T3FREE, THYROIDAB in the last 72 hours.  Invalid input(s): FREET3 Anemia work up No results for input(s): VITAMINB12, FOLATE, FERRITIN, TIBC, IRON, RETICCTPCT in the last 72 hours. Urinalysis    Component Value Date/Time   COLORURINE STRAW (A) 11/19/2016 1500   APPEARANCEUR CLEAR 11/19/2016 1500   LABSPEC 1.005 11/19/2016 1500   PHURINE 5.0 11/19/2016 1500  GLUCOSEU NEGATIVE 11/19/2016 1500   GLUCOSEU NEGATIVE 09/17/2016 1131   HGBUR NEGATIVE 11/19/2016 1500   BILIRUBINUR NEGATIVE 11/19/2016 1500   BILIRUBINUR negative 09/17/2016 1113   KETONESUR NEGATIVE 11/19/2016 1500   PROTEINUR NEGATIVE 11/19/2016 1500   UROBILINOGEN 0.2 09/17/2016 1131   NITRITE NEGATIVE 11/19/2016 1500   LEUKOCYTESUR NEGATIVE 11/19/2016 1500   Sepsis Labs Invalid input(s): PROCALCITONIN,  WBC,  LACTICIDVEN Microbiology Recent Results (from the past 240 hour(s))  Urine culture     Status: Abnormal   Collection Time: 11/19/16  2:51 PM  Result Value Ref Range Status   Specimen Description URINE, CLEAN CATCH  Final   Special Requests NONE  Final   Culture MULTIPLE SPECIES PRESENT, SUGGEST RECOLLECTION (A)  Final   Report Status 11/20/2016 FINAL  Final  Blood culture (routine x 2)     Status: None (Preliminary result)   Collection Time: 11/19/16  6:18 PM  Result Value Ref Range Status    Specimen Description RIGHT ANTECUBITAL  Final   Special Requests BOTTLES DRAWN AEROBIC ONLY 5CC  Final   Culture   Final    NO GROWTH 4 DAYS Performed at [facility]    Report Status PENDING  Incomplete  Blood culture (routine x 2)     Status: None (Preliminary result)   Collection Time: 11/19/16  6:19 PM  Result Value Ref Range Status   Specimen Description RIGHT ANTECUBITAL  Final   Special Requests BOTTLES DRAWN AEROBIC AND ANAEROBIC 5CC  Final   Culture   Final    NO GROWTH 4 DAYS Performed at [facility]    Report Status PENDING  Incomplete    Time coordinating discharge: Over 31 minutes  Vance Gather, MD  [facility] 11/23/2016, 1:39 PM Pager [phone]  If 7PM-7AM, please contact night-coverage [email or web address] Password [email or web address]

## 2016-11-23 NOTE — Progress Notes (Signed)
Pt ambulated around the whole unit with standby assist. She used front wheel walker, she had to be told to slow down she was walking so fast. Tolerated well. States she was ready to go home. Discharge instructions reviewed with pt and her husband. No questions asked. Pt discharged via wheelchair. IV discontinued.

## 2016-11-23 NOTE — Progress Notes (Signed)
This CM met with pt and spouse at bedside to offer choice for HHPT/OT as recommended by PT/OT. Home health services were politely declined by pt and spouse. Pt states she has a 3in1 and RW at home and needs no further equipment at home. Marney Doctor RN,BSN,NCM [phone]

## 2016-11-24 ENCOUNTER — Telehealth: Payer: Self-pay

## 2016-11-24 LAB — CULTURE, BLOOD (ROUTINE X 2)
Culture: NO GROWTH
Culture: NO GROWTH

## 2016-11-24 NOTE — Telephone Encounter (Signed)
D/C 11/23/16 To: home  Spoke with pt's husband and he states that pt is doing very well. She is ambulatory and eating and drinking well. She has no complaints at this time.   Appt made with Dr Maudie Mercury 12/02/16. He is aware that pt will need labs.   Dr. Corky Mull   Transition Care Management Follow-up Telephone Call  How have you been since you were released from the hospital? improving   Do you understand why you were in the hospital? yes   Do you understand the discharge instrcutions? yes  Items Reviewed:  Medications reviewed: yes  Allergies reviewed: yes  Dietary changes reviewed: yes  Referrals reviewed: yes   Functional Questionnaire:   Activities of Daily Living (ADLs):   She states they are independent in the following: ambulation, bathing and hygiene, feeding, continence, grooming, toileting and dressing States they require assistance with the following: none   Any transportation issues/concerns?: no   Any patient concerns? no   Confirmed importance and date/time of follow-up visits scheduled: yes   Confirmed with patient if condition begins to worsen call PCP or go to the ER.  Patient was given the Call-a-Nurse line [phone]: yes

## 2016-11-26 ENCOUNTER — Telehealth: Payer: Self-pay

## 2016-11-26 ENCOUNTER — Ambulatory Visit: Payer: 59 | Admitting: Family Medicine

## 2016-11-26 NOTE — ED Provider Notes (Signed)
[place] DEPT Provider Note   CSN: [id number] Arrival date & time: 11/19/16  1440     History   Chief Complaint Chief Complaint  Patient presents with  . Chest Pain  . Shortness of Breath    HPI Danielle Gaines is a 75 y.o. female.  The history is provided by the patient and the spouse.  Weakness  Primary symptoms include loss of balance (with falls). This is a recurrent problem. The current episode started 2 days ago. The problem has been gradually worsening. There was no focality noted. There has been no fever. Associated symptoms include shortness of breath, chest pain and confusion. Associated medical issues do not include trauma or CVA.    Past Medical History:  Diagnosis Date  . Arthritis   . Benzodiazepine dependence ([place])   . Carotid arterial disease (HCC)    mild  . Chronic pain    managed by pain clinic  . Colon abnormality    didn't work right so part of it was removed at [facility]  . Depression    managed by Dr. Toy Care  . Diastolic heart failure ([place])    mild on echo 2016  . GERD (gastroesophageal reflux disease)   . IBS (irritable bowel syndrome)    sees Dr. Earlean Shawl  . Macular degeneration    goes to [facility] for this  . Migraine    managed by neurologist  . Osteoporosis     Patient Active Problem List   Diagnosis Date Noted  . Chronic pain 11/19/2016  . Orthostatic hypotension 11/19/2016  . Acute hyponatremia 11/19/2016  . Malnutrition of moderate degree 11/08/2016  . Hypercalcemia 09/03/2015  . MDD (major depressive disorder), recurrent severe, without psychosis ([place]) 04/09/2015  . Stroke ([place]) 04/07/2015  . Osteoporosis 07/24/2014  . Migraine - followed by Dr. Sima Matas in Neurology 05/24/2014  . Anxiety and depression - managed at [facility] 05/24/2014  . GERD (gastroesophageal reflux disease) 05/24/2014  . Macular degeneration - goes to [facility] for this 05/24/2014    Past Surgical History:  Procedure Laterality Date  .  ABDOMINAL ADHESION SURGERY    . ABDOMINAL HYSTERECTOMY    . APPENDECTOMY    . CHOLECYSTECTOMY    . COLON SURGERY    . CYSTOSCOPY W/ URETERAL STENT PLACEMENT Right 04/27/2016   Procedure: CYSTOSCOPY WITH RETROGRADE PYELOGRAM/ RIGHT URETERAL STENT PLACEMENT;  Surgeon: Ardis Hughs, MD;  Location: [facility];  Service: Urology;  Laterality: Right;  . OVARIAN CYST SURGERY    . SMALL INTESTINE SURGERY      OB History    No data available       Home Medications    Prior to Admission medications   Medication Sig Start Date End Date Taking? Authorizing Provider  acetaminophen (TYLENOL) 500 MG tablet Take 1,000 mg by mouth every 6 (six) hours as needed for mild pain, moderate pain or headache.    Yes Historical Provider, MD  ADDERALL XR 30 MG 24 hr capsule Take 30 mg by mouth daily.    Yes Historical Provider, MD  alendronate (FOSAMAX) 70 MG tablet Take 70 mg by mouth every Saturday. Take with a full glass of water on an empty stomach.   Yes Historical Provider, MD  Biotin 1000 MCG tablet Take 1,000 mcg by mouth daily.   Yes Historical Provider, MD  cycloSPORINE (RESTASIS) 0.05 % ophthalmic emulsion Place 1 drop into both eyes 2 (two) times daily.    Yes Historical Provider, MD  LINZESS [place]  capsule Take 290 mcg by mouth daily before breakfast.    Yes Historical Provider, MD  OLANZapine-FLUoxetine (SYMBYAX) 3-25 MG capsule Take 1 capsule by mouth at bedtime.   Yes Historical Provider, MD  omeprazole (PRILOSEC) 40 MG capsule Take 40 mg by mouth daily.    Yes Historical Provider, MD  SUMAtriptan (IMITREX) 100 MG tablet Take 100 mg by mouth every 2 (two) hours as needed for migraine.    Yes Historical Provider, MD  ZENPEP 15000 units CPEP Take 45,000 mg by mouth 3 (three) times daily before meals.    Yes Historical Provider, MD  ondansetron (ZOFRAN) 4 MG tablet Take 1 tablet (4 mg total) by mouth every 8 (eight) hours as needed for nausea or vomiting. 11/23/16   Patrecia Pour, MD    potassium chloride 20 MEQ TBCR Take 20 mEq by mouth daily. 11/23/16   Patrecia Pour, MD  protriptyline (VIVACTIL) 10 MG tablet Take 1 tablet (10 mg total) by mouth 3 (three) times daily. 11/23/16   Patrecia Pour, MD  traZODone (DESYREL) 50 MG tablet Take 1 tablet (50 mg total) by mouth at bedtime as needed for sleep. 11/23/16   Patrecia Pour, MD    Family History Family History  Problem Relation Age of Onset  . Kidney disease Mother   . Heart disease Mother   . Cancer Maternal Aunt     intestinal and liver cancer  . Stroke Maternal Grandmother   . Stroke    . Depression      everyone  . Hypercalcemia Neg Hx     Social History Social History  Substance Use Topics  . Smoking status: Never Smoker  . Smokeless tobacco: Never Used  . Alcohol use No     Allergies   Sulfa antibiotics   Review of Systems Review of Systems  Respiratory: Positive for shortness of breath.   Cardiovascular: Positive for chest pain.  Gastrointestinal: Positive for diarrhea (chronic).  Neurological: Positive for weakness and loss of balance (with falls).  Psychiatric/Behavioral: Positive for confusion.  All other systems reviewed and are negative.    Physical Exam Updated Vital Signs BP 137/80 (BP Location: Left Arm)   Pulse (!) 110   Temp 97.9 F (36.6 C) (Oral)   Resp 16   Ht 5\' 1"  (1.549 m)   Wt 114 lb 6.7 oz (51.9 kg)   SpO2 97%   BMI 21.62 kg/m   Physical Exam  Constitutional: She is oriented to person, place, and time. She appears well-developed and well-nourished. No distress.  HENT:  Head: Normocephalic.  Nose: Nose normal.  Eyes: Conjunctivae are normal.  Neck: Neck supple. No tracheal deviation present.  Cardiovascular: Regular rhythm and normal heart sounds.   No extrasystoles are present. Tachycardia present.   Pulmonary/Chest: Effort normal and breath sounds normal. No respiratory distress.  Abdominal: Soft. She exhibits no distension.  Neurological: She is alert and  oriented to person, place, and time. She has normal strength. No cranial nerve deficit. GCS eye subscore is 4. GCS verbal subscore is 5. GCS motor subscore is 6.  B/l u/e dysmetria  Skin: Skin is warm and dry.  Psychiatric: She has a normal mood and affect.  Vitals reviewed.    ED Treatments / Results  Labs (all labs ordered are listed, but only abnormal results are displayed) Labs Reviewed  URINE CULTURE - Abnormal; Notable for the following:       Result Value   Culture MULTIPLE SPECIES PRESENT, SUGGEST RECOLLECTION (*)  All other components within normal limits  COMPREHENSIVE METABOLIC PANEL - Abnormal; Notable for the following:    Sodium 123 (*)    Potassium 3.1 (*)    CO2 10 (*)    Glucose, Bld 121 (*)    BUN 69 (*)    Creatinine, Ser 2.68 (*)    Calcium 8.6 (*)    Alkaline Phosphatase 129 (*)    GFR calc non Af Amer 17 (*)    GFR calc Af Amer 19 (*)    All other components within normal limits  CBC WITH DIFFERENTIAL/PLATELET - Abnormal; Notable for the following:    WBC 18.9 (*)    Platelets 460 (*)    Neutro Abs 16.6 (*)    Lymphs Abs 0.6 (*)    Monocytes Absolute 1.6 (*)    All other components within normal limits  URINALYSIS, ROUTINE W REFLEX MICROSCOPIC - Abnormal; Notable for the following:    Color, Urine STRAW (*)    All other components within normal limits  BLOOD GAS, VENOUS - Abnormal; Notable for the following:    pH, Ven 7.136 (*)    pCO2, Ven 24.4 (*)    pO2, Ven 58.7 (*)    Bicarbonate 7.9 (*)    Acid-base deficit 20.4 (*)    All other components within normal limits  OSMOLALITY, URINE - Abnormal; Notable for the following:    Osmolality, Ur 234 (*)    All other components within normal limits  BASIC METABOLIC PANEL - Abnormal; Notable for the following:    Sodium 130 (*)    Potassium 2.5 (*)    CO2 9 (*)    Glucose, Bld 145 (*)    BUN 56 (*)    Creatinine, Ser 1.97 (*)    Calcium 8.0 (*)    GFR calc non Af Amer 24 (*)    GFR calc Af  Amer 28 (*)    All other components within normal limits  BASIC METABOLIC PANEL - Abnormal; Notable for the following:    Sodium 130 (*)    Potassium 2.4 (*)    CO2 13 (*)    Glucose, Bld 111 (*)    BUN 50 (*)    Creatinine, Ser 1.62 (*)    Calcium 7.9 (*)    GFR calc non Af Amer 30 (*)    GFR calc Af Amer 35 (*)    All other components within normal limits  BASIC METABOLIC PANEL - Abnormal; Notable for the following:    CO2 17 (*)    Glucose, Bld 108 (*)    BUN 40 (*)    Creatinine, Ser 1.28 (*)    Calcium 8.0 (*)    GFR calc non Af Amer 40 (*)    GFR calc Af Amer 47 (*)    All other components within normal limits  CBC - Abnormal; Notable for the following:    Platelets 401 (*)    All other components within normal limits  CBC - Abnormal; Notable for the following:    WBC 14.9 (*)    Platelets 472 (*)    All other components within normal limits  CREATININE, SERUM - Abnormal; Notable for the following:    Creatinine, Ser 2.07 (*)    GFR calc non Af Amer 23 (*)    GFR calc Af Amer 26 (*)    All other components within normal limits  BASIC METABOLIC PANEL - Abnormal; Notable for the following:    Sodium 134 (*)  CO2 20 (*)    Glucose, Bld 122 (*)    BUN 38 (*)    Creatinine, Ser 1.40 (*)    GFR calc non Af Amer 36 (*)    GFR calc Af Amer 42 (*)    All other components within normal limits  CBC - Abnormal; Notable for the following:    HCT 34.4 (*)    Platelets 426 (*)    All other components within normal limits  BASIC METABOLIC PANEL - Abnormal; Notable for the following:    Potassium 3.1 (*)    Glucose, Bld 101 (*)    BUN 37 (*)    Creatinine, Ser 1.41 (*)    GFR calc non Af Amer 36 (*)    GFR calc Af Amer 42 (*)    All other components within normal limits  BASIC METABOLIC PANEL - Abnormal; Notable for the following:    Potassium 3.0 (*)    BUN 27 (*)    Creatinine, Ser 1.12 (*)    GFR calc non Af Amer 48 (*)    GFR calc Af Amer 55 (*)    All other  components within normal limits  CBC - Abnormal; Notable for the following:    HCT 34.9 (*)    Platelets 435 (*)    All other components within normal limits  BASIC METABOLIC PANEL - Abnormal; Notable for the following:    Sodium 133 (*)    Glucose, Bld 103 (*)    BUN 23 (*)    Creatinine, Ser 1.12 (*)    GFR calc non Af Amer 48 (*)    GFR calc Af Amer 55 (*)    All other components within normal limits  I-STAT CHEM 8, ED - Abnormal; Notable for the following:    Sodium 125 (*)    BUN 61 (*)    Creatinine, Ser 2.70 (*)    Glucose, Bld 117 (*)    Calcium, Ion 1.12 (*)    Hemoglobin 16.0 (*)    HCT 47.0 (*)    All other components within normal limits  CULTURE, BLOOD (ROUTINE X 2)  CULTURE, BLOOD (ROUTINE X 2)  APTT  PROTIME-INR  NA AND K (SODIUM & POTASSIUM), RAND UR  OSMOLALITY  MAGNESIUM  CORTISOL-AM, BLOOD  I-STAT CG4 LACTIC ACID, ED  I-STAT TROPOININ, ED    EKG  EKG Interpretation  Date/Time:  Friday November 19 2016 15:28:42 EST Ventricular Rate:  92 PR Interval:    QRS Duration: 82 QT Interval:  381 QTC Calculation: 472 R Axis:   -29 Text Interpretation:  Sinus rhythm Borderline prolonged PR interval Borderline left axis deviation Low voltage, precordial leads Anteroseptal infarct, old Nonspecific T abnormalities, lateral leads No significant change since last tracing Confirmed by Ihsan Nomura MD, Quillian Quince [id number]) on 11/19/2016 5:20:06 PM       Radiology No results found.  Procedures Procedures (including critical care time)  CRITICAL CARE Performed by: Leo Grosser Total critical care time: 30 minutes Critical care time was exclusive of separately billable procedures and treating other patients. Critical care was necessary to treat or prevent imminent or life-threatening deterioration. Critical care was time spent personally by me on the following activities: development of treatment plan with patient and/or surrogate as well as nursing, discussions with  consultants, evaluation of patient's response to treatment, examination of patient, obtaining history from patient or surrogate, ordering and performing treatments and interventions, ordering and review of laboratory studies, ordering and review of radiographic studies, pulse  oximetry and re-evaluation of patient's condition.   Medications Ordered in ED Medications  sodium chloride 0.9 % bolus 1,000 mL (0 mLs Intravenous Stopped 11/19/16 1648)  sodium chloride 0.9 % bolus 1,000 mL (0 mLs Intravenous Stopped 11/19/16 1648)  0.9 %  sodium chloride infusion ( Intravenous Not Given 11/20/16 0158)  potassium chloride SA (K-DUR,KLOR-CON) CR tablet 40 mEq (40 mEq Oral Given 11/19/16 2310)  potassium chloride 10 mEq in 100 mL IVPB (10 mEq Intravenous Given 11/20/16 0105)  potassium chloride 10 mEq in 100 mL IVPB (10 mEq Intravenous Given 11/20/16 0511)  calcium gluconate 2 g in sodium chloride 0.9 % 100 mL IVPB (2 g Intravenous Given 11/20/16 0830)  potassium chloride (K-DUR,KLOR-CON) CR tablet 30 mEq (30 mEq Oral Given 11/21/16 2241)  potassium chloride SA (K-DUR,KLOR-CON) CR tablet 40 mEq (40 mEq Oral Given 11/22/16 2108)     Initial Impression / Assessment and Plan / ED Course  I have reviewed the triage vital signs and the nursing notes.  Pertinent labs & imaging results that were available during my care of the patient were reviewed by me and considered in my medical decision making (see chart for details).  Clinical Course    75 y.o. female presents with hypotension and altered mental status with some notable dysmetria. Has acute hyponatremia and is clinically dehydrated. Fluid resuscitated in the ED. Labs also notable for AKI. Admit for IVF rehydration and safe, slow correction of sodium level.   Final Clinical Impressions(s) / ED Diagnoses   Final diagnoses:  AKI (acute kidney injury) ([place])  Acute hyponatremia  Severe dehydration    New Prescriptions Discharge Medication List as of  11/23/2016  1:54 PM       Leo Grosser, MD 11/26/16 1609

## 2016-11-26 NOTE — Telephone Encounter (Signed)
Spoke with pt's husband. He states that pt has been having trouble sleeping since home. She took 1 Trazodone, 50mg , night before last and she slept somewhat ok. She took 2 Trazodone last night, 100mg  total, and slept very well. He would like to know if the 100mg  is ok or what else she can take for sleep.   Dr. Maudie Mercury - Please advise. Thanks!

## 2016-11-26 NOTE — Telephone Encounter (Signed)
Spoke with pt's husband and advised. He will try the 50mg  and see how she does. Advised that this will be discussed at [place]. He voiced understanding. Nothing further needed at this time.

## 2016-11-26 NOTE — Telephone Encounter (Signed)
We can discuss at follow up - but I would limit this medication to only 50mg  and only as needed not more then a few times per week.  This medication can make you very sleepy.

## 2016-12-01 ENCOUNTER — Telehealth: Payer: Self-pay

## 2016-12-01 NOTE — Telephone Encounter (Signed)
Spoke with pt's husband. He states that pt has had increasing tremors since coming home from the hospital. He is concerned about it being a medication withdrawal. I researched the pt's medications that were stopped in the hospital and the only one that has tremors as a side effect of withdrawal os hyoscyamine. Pt has not taken this medication since she has been home. Husband agrees that it does seem like a medication issue. Advised him that pt should be seen tomorrow by Dr Raliegh Ip and let her do a limited neuro eval to help determine the origin of the tremors. Husband agrees. He will bring pt in to be seen. Nothing further needed at this time.   Dr. Maudie Mercury - Jul[place]ier

## 2016-12-02 ENCOUNTER — Ambulatory Visit (INDEPENDENT_AMBULATORY_CARE_PROVIDER_SITE_OTHER): Payer: 59 | Admitting: Family Medicine

## 2016-12-02 ENCOUNTER — Encounter: Payer: Self-pay | Admitting: Family Medicine

## 2016-12-02 VITALS — BP 102/62 | HR 70 | Temp 98.0°F | Ht 61.0 in | Wt 111.3 lb

## 2016-12-02 DIAGNOSIS — K589 Irritable bowel syndrome without diarrhea: Secondary | ICD-10-CM | POA: Diagnosis not present

## 2016-12-02 DIAGNOSIS — F3342 Major depressive disorder, recurrent, in full remission: Secondary | ICD-10-CM | POA: Diagnosis not present

## 2016-12-02 DIAGNOSIS — G43809 Other migraine, not intractable, without status migrainosus: Secondary | ICD-10-CM

## 2016-12-02 DIAGNOSIS — E871 Hypo-osmolality and hyponatremia: Secondary | ICD-10-CM | POA: Diagnosis not present

## 2016-12-02 DIAGNOSIS — N179 Acute kidney failure, unspecified: Secondary | ICD-10-CM | POA: Diagnosis not present

## 2016-12-02 DIAGNOSIS — E860 Dehydration: Secondary | ICD-10-CM

## 2016-12-02 DIAGNOSIS — E440 Moderate protein-calorie malnutrition: Secondary | ICD-10-CM | POA: Diagnosis not present

## 2016-12-02 DIAGNOSIS — Z79899 Other long term (current) drug therapy: Secondary | ICD-10-CM

## 2016-12-02 LAB — BASIC METABOLIC PANEL
BUN: 9 mg/dL (ref 6–23)
CO2: 25 mEq/L (ref 19–32)
Calcium: 9.5 mg/dL (ref 8.4–10.5)
Chloride: 111 mEq/L (ref 96–112)
Creatinine, Ser: 0.83 mg/dL (ref 0.40–1.20)
GFR: 71.56 mL/min (ref >60.00)
Glucose, Bld: 101 mg/dL — ABNORMAL HIGH (ref 70–99)
Potassium: 4.2 mEq/L (ref 3.5–5.1)
Sodium: 143 mEq/L (ref 135–145)

## 2016-12-02 NOTE — Patient Instructions (Addendum)
BEFORE YOU LEAVE: -follow up: 3 months -labs Wendie Simmer, please print them an updated medication list.  Please follow up with your psychiatrist a soon as possible. Call them today to make them aware of medication changes and symptoms.   Please call your neurologist today about the recent medications changes and the tremors. Your topamax was stopped in the hospital.  Please call your gastroenterologist about questions regarding the hyocosamine and your symptoms.  Please ensure you are eating 3 meals and the protein supplement daily.  Seek care immediately if any worsening, new concerns or if vomiting persists.

## 2016-12-02 NOTE — Progress Notes (Signed)
HPI:  Danielle Gaines is a pleasant 76 yo with a complicated PMH significant for chronic pain (sees specialist), major depressive disorder (managed by her psychiatrist), IBS, migraines (sees neurologist), chronic shortness of breath and orthostatic hypotension(sees cardiologist), a history of hypercalcemia (evaluated by endocrinologist) here for a transition of care visit after recent hospitalization. See transitional care call. She has been hospitalized twice in the last month, the first time for presumed sepsis secondary to Klebsiella UTI. Hospitalized 11/19/2016-11/23/2016 Per discharge summary: -dehydration with low sodium, AKI and elevated WBCs (felt to be hemoconcentration - infection work up neg) on admission, malnutrition -improved with IVFs and bicarb -concern for polypharmacy and topamax was stopped, protriptyline and trazadone with decreased, hyocosamine was held -declined [facility] or PT/OT -recs to repeat bmp here today -reports: Doing much better overall, however since her reduction in medications she has had worsening of her anxiety, poor sleep and some shaking -she has not seen her psychiatrist or neurologist since her discharge -She also has had nausea or hospitalization and Has vomited a few times -No diarrhea, fevers, dysuria, frequent emesis, inability to tolerate oral intake of food, suicidal ideation, shortness of breath  ROS: See pertinent positives and negatives per HPI.  Past Medical History:  Diagnosis Date  . Arthritis   . Benzodiazepine dependence ([place])   . Carotid arterial disease (HCC)    mild  . Chronic pain    managed by pain clinic  . Colon abnormality    didn't work right so part of it was removed at [facility]  . Depression    managed by Dr. Toy Care  . Diastolic heart failure ([place])    mild on echo 2016  . GERD (gastroesophageal reflux disease)   . IBS (irritable bowel syndrome)    sees Dr. Earlean Shawl  . Macular degeneration    goes to [facility] for this  .  Migraine    managed by neurologist  . Osteoporosis     Past Surgical History:  Procedure Laterality Date  . ABDOMINAL ADHESION SURGERY    . ABDOMINAL HYSTERECTOMY    . APPENDECTOMY    . CHOLECYSTECTOMY    . COLON SURGERY    . CYSTOSCOPY W/ URETERAL STENT PLACEMENT Right 04/27/2016   Procedure: CYSTOSCOPY WITH RETROGRADE PYELOGRAM/ RIGHT URETERAL STENT PLACEMENT;  Surgeon: Ardis Hughs, MD;  Location: [facility];  Service: Urology;  Laterality: Right;  . OVARIAN CYST SURGERY    . SMALL INTESTINE SURGERY      Family History  Problem Relation Age of Onset  . Kidney disease Mother   . Heart disease Mother   . Cancer Maternal Aunt     intestinal and liver cancer  . Stroke Maternal Grandmother   . Stroke    . Depression      everyone  . Hypercalcemia Neg Hx     Social History   Social History  . Marital status: Married    Spouse name: N/A  . Number of children: N/A  . Years of education: N/A   Social History Main Topics  . Smoking status: Never Smoker  . Smokeless tobacco: Never Used  . Alcohol use No  . Drug use: No  . Sexual activity: No   Other Topics Concern  . None   Social History Narrative   Work or School: none      Home Situation: lives with husband      Spiritual Beliefs:       Lifestyle: no regular exercise; diet is ok  Current Outpatient Prescriptions:  .  acetaminophen (TYLENOL) 500 MG tablet, Take 1,000 mg by mouth every 6 (six) hours as needed for mild pain, moderate pain or headache. , Disp: , Rfl:  .  ADDERALL XR 30 MG 24 hr capsule, Take 30 mg by mouth daily. , Disp: , Rfl: 0 .  alendronate (FOSAMAX) 70 MG tablet, Take 70 mg by mouth every Saturday. Take with a full glass of water on an empty stomach., Disp: , Rfl:  .  Biotin 1000 MCG tablet, Take 1,000 mcg by mouth daily., Disp: , Rfl:  .  cycloSPORINE (RESTASIS) 0.05 % ophthalmic emulsion, Place 1 drop into both eyes 2 (two) times daily. , Disp: , Rfl:  .  LINZESS 290 MCG  CAPS capsule, Take 290 mcg by mouth daily before breakfast. , Disp: , Rfl: 0 .  OLANZapine-FLUoxetine (SYMBYAX) 3-25 MG capsule, Take 1 capsule by mouth at bedtime., Disp: , Rfl: 0 .  omeprazole (PRILOSEC) 40 MG capsule, Take 40 mg by mouth daily. , Disp: , Rfl: 0 .  ondansetron (ZOFRAN) 4 MG tablet, Take 1 tablet (4 mg total) by mouth every 8 (eight) hours as needed for nausea or vomiting., Disp: 20 tablet, Rfl: 0 .  potassium chloride 20 MEQ TBCR, Take 20 mEq by mouth daily., Disp: 30 tablet, Rfl: 0 .  protriptyline (VIVACTIL) 10 MG tablet, Take 1 tablet (10 mg total) by mouth 3 (three) times daily., Disp: 90 tablet, Rfl: 0 .  SUMAtriptan (IMITREX) 100 MG tablet, Take 100 mg by mouth every 2 (two) hours as needed for migraine. , Disp: , Rfl:  .  ZENPEP 15000 units CPEP, Take 45,000 mg by mouth 3 (three) times daily before meals. , Disp: , Rfl: 0  EXAM:  Vitals:   12/02/16 1332  BP: 102/62  Pulse: 70  Temp: 98 F (36.7 C)    Body mass index is 21.03 kg/m.  GENERAL: vitals reviewed and listed above, alert, oriented, appears well hydrated and in no acute distress  HEENT: atraumatic, conjunttiva clear, no obvious abnormalities on inspection of external nose and ears  NECK: no obvious masses on inspection  LUNGS: clear to auscultation bilaterally, no wheezes, rales or rhonchi, good air movement  CV: HRRR, no peripheral edema  MS: moves all extremities without noticeable abnormality  PSYCH: pleasant and cooperative, no obvious depression or anxiety  ASSESSMENT AND PLAN:  Discussed the following assessment and plan:  Polypharmacy  Low sodium levels - Plan: Basic metabolic panel  Moderate malnutrition (HCC)  Dehydration - Plan: Basic metabolic panel  AKI (acute kidney injury) ([place]) - Plan: Basic metabolic panel  Other migraine without status migrainosus, not intractable  Irritable bowel syndrome, unspecified type  Recurrent major depressive disorder, in full remission  ([place])  -We'll check her electrolytes and will stop the potassium if electrolytes are stable, as this could be causing some nausea -Advised that they discuss her recent medication changes with her specialists (psychiatry and neurology), to see if the abrupt cessation or tapering could be causing some of her symptoms - overall she seems to actually be doing much better with reduction of her medications -She did seem somewhat tremulous initially when she was anxious start of her appointment, however this resolved once she relaxed -Check a basic metabolic panel today -Advised 3 healthy small meals daily and continuation of her supplements once daily  -She is to follow-up with her gastroenterologist about her recent change in the high histamine and if she has any persistent nausea or  vomiting  -Patient advised to return or notify a doctor immediately if symptoms worsen or persist or new concerns arise.  Patient Instructions  BEFORE YOU LEAVE: -follow up: 3 months -labs Wendie Simmer, please print them an updated medication list.  Please follow up with your psychiatrist a soon as possible. Call them today to make them aware of medication changes and symptoms.   Please call your neurologist today about the recent medications changes and the tremors. Your topamax was stopped in the hospital.  Please call your gastroenterologist about questions regarding the hyocosamine and your symptoms.  Please ensure you are eating 3 meals and the protein supplement daily.  Seek care immediately if any worsening, new concerns or if vomiting persists.       Colin Benton R., DO

## 2016-12-02 NOTE — Progress Notes (Signed)
Pre visit review using our clinic review tool, if applicable. No additional management support is needed unless otherwise documented below in the visit note. 

## 2016-12-03 NOTE — Addendum Note (Signed)
Addended by: Agnes Lawrence on: 12/03/2016 02:47 PM   Modules accepted: Orders

## 2016-12-06 ENCOUNTER — Telehealth: Payer: Self-pay | Admitting: Family Medicine

## 2016-12-06 NOTE — Telephone Encounter (Signed)
Agree with suggestions and would suggest they also follow up with her gastroenterologist.

## 2016-12-06 NOTE — Telephone Encounter (Signed)
Spoke with pt's husband and he states that pt is still having significant episodes of nausea. She has stopped taking the potassium. They have spoken with neurologist and they are not sure if it is medication related. They think it may be withdrawal from the medications that were stopped during her hospitalization. She has been taking Zofran with no relief. She has taken Dramamine with some relief. Advised husband to get non-drowsy formula. Also advised that if it is withdrawal from medications the symptoms should start to improve. Also advised that pt continue her 3 small meals a day and not eat heavy, fried or too much dairy. Continue supplemental nutrition daily. They would like to know what else they can do.   Dr. Maudie Mercury - Please advise. Thanks!

## 2016-12-06 NOTE — Telephone Encounter (Signed)
Pts husband is calling to let you know that pt is still having severe nausea and refuse a appointment with Dr. Maudie Mercury want to speak with you b/c you have been assisting them.

## 2016-12-07 NOTE — Telephone Encounter (Signed)
Spoke with pt's husband and confirmed previous advice/recommensations. Advised that pt needs to see GI soon, especially if n/v persist much longer. Husband voiced understanding. Nothing further needed at this time.

## 2016-12-08 ENCOUNTER — Telehealth: Payer: Self-pay

## 2016-12-08 NOTE — Telephone Encounter (Signed)
Dr. Maudie Mercury - They have received all messages to this point. I think he may now be overly worried given recent illness. He is to call me back with GI appt info. Just wanted to make sure there was no additional testing you may think neccessary at this time.   Thanks!

## 2016-12-08 NOTE — Telephone Encounter (Signed)
Did they get my prior message regarding this? Do advise seeing their gastroenterologist for this. If vomiting, more then 3 episodes of diarrhea daily, not able to tolerate oral intake or they think she is getting dehydrated, sick or weak again needs appt asap. Thanks.

## 2016-12-08 NOTE — Telephone Encounter (Signed)
Any testing would be dependent on symptoms and exam. C. Diff if > 3 watery stools daily, etc. Thanks!

## 2016-12-08 NOTE — Telephone Encounter (Signed)
Spoke with pt's husband and he states that despite medications pt continues to have n/v/d. He is concerned that she may have a "bug" since she is not improving. He is planning to call GI this morning and I urged him to try and get an appt ASAP. Advised pt to continue with plan as previously discussed and to call back with GI appt info.   Dr Maudie Mercury - Please advise if pt may need additional testing for possible GI issues (CDiff?) since pt was hospitalized recently. Thanks!

## 2016-12-09 NOTE — Telephone Encounter (Signed)
Pt husband is return sheena call

## 2016-12-10 NOTE — Telephone Encounter (Signed)
Spoke with pt's husband. He states that she had one severe bout of n/v Wednesday but has improved since then. She seems to be doing well. She does have GI appt next week. Advised of s/s to look for that would require immediate assessment. Nothing further needed.

## 2016-12-13 ENCOUNTER — Telehealth: Payer: Self-pay | Admitting: Family Medicine

## 2016-12-13 NOTE — Telephone Encounter (Signed)
Spoke with pt's husband. He states that pt has been having continued issues with dizziness and nausea. She is fine lying down, but once sits up she gets dizzy, nauseous and sometimes vomits. She also has some blurry vision at times. He has tried OTC Meclizine and that has helped some, he would like to know if you could send in prescription strength for pt to try. Zofran helps some, but the Meclizine seems to be more helpful. She does have GI appt on Wednesday and they plan to keep that.   Dr. Maudie Mercury - Please advise. Thanks!

## 2016-12-13 NOTE — Telephone Encounter (Signed)
Pt husband is calling back concerning his wife

## 2016-12-13 NOTE — Telephone Encounter (Signed)
I am glad she will see GI. I really thinks she needs an appointment with her neurologist or me for the other complaints. Please help her to schedule. Thanks.

## 2016-12-14 ENCOUNTER — Ambulatory Visit: Payer: 59

## 2016-12-14 NOTE — Telephone Encounter (Signed)
LMTCB

## 2016-12-14 NOTE — Telephone Encounter (Signed)
Spoke with husband and advised. He states that pt has neuro appt 12/30/16. He did schedule follow up with Dr Maudie Mercury 12/21/16. Nothing further needed at this time.

## 2016-12-21 ENCOUNTER — Ambulatory Visit (INDEPENDENT_AMBULATORY_CARE_PROVIDER_SITE_OTHER): Payer: 59 | Admitting: Family Medicine

## 2016-12-21 ENCOUNTER — Encounter: Payer: Self-pay | Admitting: Family Medicine

## 2016-12-21 ENCOUNTER — Other Ambulatory Visit: Payer: 59

## 2016-12-21 VITALS — BP 118/80 | HR 105 | Temp 97.9°F | Ht 61.0 in | Wt 115.1 lb

## 2016-12-21 DIAGNOSIS — E876 Hypokalemia: Secondary | ICD-10-CM

## 2016-12-21 DIAGNOSIS — R42 Dizziness and giddiness: Secondary | ICD-10-CM | POA: Diagnosis not present

## 2016-12-21 DIAGNOSIS — R112 Nausea with vomiting, unspecified: Secondary | ICD-10-CM | POA: Diagnosis not present

## 2016-12-21 LAB — CBC
HCT: 38.4 % (ref 36.0–46.0)
Hemoglobin: 12.8 g/dL (ref 12.0–15.0)
MCHC: 33.2 g/dL (ref 30.0–36.0)
MCV: 93.9 fl (ref 78.0–100.0)
Platelets: 293 10*3/uL (ref 150.0–400.0)
RBC: 4.09 Mil/uL (ref 3.87–5.11)
RDW: 13.1 % (ref 11.5–15.5)
WBC: 10.1 10*3/uL (ref 4.0–10.5)

## 2016-12-21 LAB — BASIC METABOLIC PANEL
BUN: 17 mg/dL (ref 6–23)
CO2: 29 mEq/L (ref 19–32)
Calcium: 9.5 mg/dL (ref 8.4–10.5)
Chloride: 102 mEq/L (ref 96–112)
Creatinine, Ser: 0.88 mg/dL (ref 0.40–1.20)
GFR: 66.88 mL/min (ref >60.00)
Glucose, Bld: 98 mg/dL (ref 70–99)
Potassium: 4.1 mEq/L (ref 3.5–5.1)
Sodium: 141 mEq/L (ref 135–145)

## 2016-12-21 LAB — TSH: TSH: 0.97 u[IU]/mL (ref 0.35–4.50)

## 2016-12-21 NOTE — Progress Notes (Signed)
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 She has been in and out of the hospital several times in the last few months. See office visit from 12/02/2016 for details. During that hospitalization polypharmacy was thought to be the cause of many of her symptoms and a number of her psychiatric and neurological medications were stopped or decreased. She reports she has seen her psychiatrist recently. She has an appointment with her neurologist in about a week. She reports since discharge from the hospital she has had persistent nausea and some vomiting at most once per day. She is seeing her gastroenterologist, Dr. Earlean Shawl, tomorrow for these symptoms. She also has a feeling of vertigo triggered only by movements of the head to the right or left. She does not experience vertigo with standing if she keeps her head straight. No fevers, weakness or numbness, speech changes, altered mental status recently, diarrhea, abdominal pain, weight loss, headaches, chest pain, shortness of breath or rashes.   ROS: See pertinent positives and negatives per HPI.  Past Medical History:  Diagnosis Date  . Arthritis   . Benzodiazepine dependence ([place])   . Carotid arterial disease (HCC)    mild  . Chronic pain    managed by pain clinic  . Colon abnormality    didn't work right so part of it was removed at [facility]  . Depression    managed by Dr. Toy Care  . Diastolic heart failure ([place])    mild on echo 2016  . GERD (gastroesophageal reflux disease)   . IBS (irritable bowel syndrome)    sees Dr. Earlean Shawl  . Macular degeneration    goes to [facility]  for this  . Migraine    managed by neurologist  . Osteoporosis     Past Surgical History:  Procedure Laterality Date  . ABDOMINAL ADHESION SURGERY    . ABDOMINAL HYSTERECTOMY    . APPENDECTOMY    . CHOLECYSTECTOMY    . COLON SURGERY    . CYSTOSCOPY W/ URETERAL STENT PLACEMENT Right 04/27/2016   Procedure: CYSTOSCOPY WITH RETROGRADE PYELOGRAM/ RIGHT URETERAL STENT PLACEMENT;  Surgeon: Ardis Hughs, MD;  Location: [facility];  Service: Urology;  Laterality: Right;  . OVARIAN CYST SURGERY    . SMALL INTESTINE SURGERY      Family History  Problem Relation Age of Onset  . Kidney disease Mother   . Heart disease Mother   . Cancer Maternal Aunt     intestinal and liver cancer  . Stroke Maternal Grandmother   . Stroke    . Depression      everyone  . Hypercalcemia Neg Hx     Social History   Social History  . Marital status: Married    Spouse name: N/A  . Number of children: N/A  . Years of education: N/A   Social History Main Topics  . Smoking status: Never Smoker  . Smokeless tobacco: Never Used  . Alcohol use No  . Drug use: No  . Sexual activity: No   Other Topics Concern  . None   Social History Narrative   Work or School: none      Home Situation: lives with husband  Spiritual Beliefs:       Lifestyle: no regular exercise; diet is ok              Current Outpatient Prescriptions:  .  acetaminophen (TYLENOL) 500 MG tablet, Take 1,000 mg by mouth every 6 (six) hours as needed for mild pain, moderate pain or headache. , Disp: , Rfl:  .  ADDERALL XR 30 MG 24 hr capsule, Take 30 mg by mouth daily. , Disp: , Rfl: 0 .  alendronate (FOSAMAX) 70 MG tablet, Take 70 mg by mouth every Saturday. Take with a full glass of water on an empty stomach., Disp: , Rfl:  .  Biotin 1000 MCG tablet, Take 1,000 mcg by mouth daily., Disp: , Rfl:  .  cycloSPORINE (RESTASIS) 0.05 % ophthalmic emulsion, Place 1 drop into both eyes 2 (two) times daily. , Disp: , Rfl:  .   LINZESS 290 MCG CAPS capsule, Take 290 mcg by mouth daily before breakfast. , Disp: , Rfl: 0 .  MECLIZINE HCL PO, Take by mouth as needed., Disp: , Rfl:  .  omeprazole (PRILOSEC) 40 MG capsule, Take 40 mg by mouth daily. , Disp: , Rfl: 0 .  SUMAtriptan (IMITREX) 100 MG tablet, Take 100 mg by mouth every 2 (two) hours as needed for migraine. , Disp: , Rfl:  .  traZODone (DESYREL) 50 MG tablet, 2 qhs, Disp: , Rfl:  .  ZENPEP 15000 units CPEP, Take 45,000 mg by mouth 3 (three) times daily before meals. , Disp: , Rfl: 0  EXAM:  Vitals:   12/21/16 1012  BP: 118/80  Pulse: (!) 105  Temp: 97.9 F (36.6 C)    Body mass index is 21.75 kg/m.  GENERAL: vitals reviewed and listed above, alert, oriented, appears well hydrated and in no acute distress  HEENT: atraumatic, conjunttiva clear, no obvious abnormalities on inspection of external nose and ears  NECK: no obvious masses on inspection, no bruit  LUNGS: clear to auscultation bilaterally, no wheezes, rales or rhonchi, good air movement  CV: HRRR, no peripheral edema  ABD: soft , nttp  MS: moves all extremities without noticeable abnormality  PSYCH/NEURO: pleasant and cooperative, no obvious depression or anxiety, speech and thought processing grossly intact, cranial nerves II through XII grossly intact, alert and nose normal, Marye Round reproduces symptoms with mild rotational my statements to the left Symptoms of dizziness are reproduced when she turns her head particularly to the left or with back-and-forth movements of the head. Symptoms do not occur when she stands up suddenly without turning the head.  ASSESSMENT AND PLAN:  Discussed the following assessment and plan:  Vertigo - Plan: Ambulatory referral to Physical Therapy, TSH, CBC (no diff)., BMP -we discussed possible serious and likely etiologies, workup and treatment, treatment risks and return precautions - may be multifactorial, the actual vertigo seems to be  positional and opted to get some vestibular rehabilitation to see if this helps while waiting on neurology evaluation next week. She feels that the meclizine really helps, so we will allow her to continue this in the meantime. Does not seem to be orthostatic. -of course, we advised Nikitta  to return or notify a doctor immediately if symptoms worsen or persist or new concerns arise. -We will get some basic labs  Nausea and vomiting, intractability of vomiting not specified, unspecified vomiting type - Plan: Basic metabolic panel, CBC, TSH -Many potential etiologies, also may be multifactorial, will need evaluation with gastroenterologist and this is scheduled for tomorrow.  Anxiety certainly can cause or worsen these symptoms and vertigo can worsen nausea and vomiting so these may likely be playing a role as well. -We'll get some basic labs  Hypokalemia - Plan: BMP  -Patient advised to return or notify a doctor immediately if symptoms worsen or persist or new concerns arise.  Patient Instructions  BEFORE YOU LEAVE: -follow up: 2 months -PT referral information - urgent referral -labs  -See your gastroenterologist today about the nausea and vomiting.  -see your neurologist as soon as possible about the vertigo.  -We placed a referral for you as discussed for vestibular rehab for vertigo. It usually takes about 1-2 weeks to process and schedule this referral. If you have not heard from [place] regarding this appointment in 2 weeks please contact our office.  We have ordered labs or studies at this visit. It can take up to 1-2 weeks for results and processing. IF results require follow up or explanation, we will call you with instructions. Clinically stable results will be released to your [facility]. If you have not heard from [place] or cannot find your results in [facility] in 2 weeks please contact our office at [phone].  If you are not yet signed up for [facility], please consider signing  up.            Colin Benton R., DO

## 2016-12-21 NOTE — Patient Instructions (Signed)
BEFORE YOU LEAVE: -follow up: 2 months -PT referral information - urgent referral -labs  -See your gastroenterologist today about the nausea and vomiting.  -see your neurologist as soon as possible about the vertigo.  -We placed a referral for you as discussed for vestibular rehab for vertigo. It usually takes about 1-2 weeks to process and schedule this referral. If you have not heard from [place] regarding this appointment in 2 weeks please contact our office.  We have ordered labs or studies at this visit. It can take up to 1-2 weeks for results and processing. IF results require follow up or explanation, we will call you with instructions. Clinically stable results will be released to your [facility]. If you have not heard from [place] or cannot find your results in [facility] in 2 weeks please contact our office at [phone].  If you are not yet signed up for [facility], please consider signing up.

## 2016-12-21 NOTE — Progress Notes (Signed)
Pre visit review using our clinic review tool, if applicable. No additional management support is needed unless otherwise documented below in the visit note. 

## 2016-12-28 ENCOUNTER — Encounter: Payer: Self-pay | Admitting: Physical Therapy

## 2016-12-28 ENCOUNTER — Ambulatory Visit: Payer: 59 | Attending: Family Medicine | Admitting: Physical Therapy

## 2016-12-28 DIAGNOSIS — R42 Dizziness and giddiness: Secondary | ICD-10-CM | POA: Insufficient documentation

## 2016-12-28 DIAGNOSIS — H8112 Benign paroxysmal vertigo, left ear: Secondary | ICD-10-CM | POA: Diagnosis not present

## 2016-12-28 DIAGNOSIS — R2689 Other abnormalities of gait and mobility: Secondary | ICD-10-CM | POA: Diagnosis not present

## 2016-12-28 NOTE — Therapy (Addendum)
[place] [address] [place] [facility] [place], [place], [address] Phone: [phone]   Fax:  [phone]  Physical Therapy Evaluation  Patient Details  Name: Danielle Gaines MRN: [id number] Date of Birth: 1941/04/28 Referring Provider: Dr. Colin Benton  Encounter Date: 12/28/2016      PT End of Session - 12/28/16 1514    Visit Number 1   Number of Visits 4   Date for PT Re-Evaluation 01/18/17   Authorization Type vertigo [employer] 18 $40 copay 20 visit limit for PT   Authorization Time Period  12/28/16 to 02/26/17   PT Start Time 1400   PT Stop Time 1440   PT Time Calculation (min) 40 min   Activity Tolerance Patient tolerated treatment well   Behavior During Therapy [facility] for tasks assessed/performed      Past Medical History:  Diagnosis Date  . Arthritis   . Benzodiazepine dependence ([place])   . Carotid arterial disease (HCC)    mild  . Chronic pain    managed by pain clinic  . Colon abnormality    didn't work right so part of it was removed at [facility]  . Depression    managed by Dr. Toy Care  . Diastolic heart failure ([place])    mild on echo 2016  . GERD (gastroesophageal reflux disease)   . IBS (irritable bowel syndrome)    sees Dr. Earlean Shawl  . Macular degeneration    goes to [facility] for this  . Migraine    managed by neurologist  . Osteoporosis     Past Surgical History:  Procedure Laterality Date  . ABDOMINAL ADHESION SURGERY    . ABDOMINAL HYSTERECTOMY    . APPENDECTOMY    . CHOLECYSTECTOMY    . COLON SURGERY    . CYSTOSCOPY W/ URETERAL STENT PLACEMENT Right 04/27/2016   Procedure: CYSTOSCOPY WITH RETROGRADE PYELOGRAM/ RIGHT URETERAL STENT PLACEMENT;  Surgeon: Ardis Hughs, MD;  Location: [facility];  Service: Urology;  Laterality: Right;  . OVARIAN CYST SURGERY    . SMALL INTESTINE SURGERY      There were no vitals filed for this visit.       Subjective Assessment - 12/28/16 1400    Subjective Today is  above average day. Feels off-kilter. Gets nauseated. Felt spinning with Dr. Maudie Mercury and sometimes feels it when she turns over in the bed. Reports multiple falls. Last fell 2 days ago. ?turned too quick as leaving bathroom and felt dizzy and fell. Denies injury. Initially having N/V, but has not vomited in one week. Has prn Phenergan from [facility].    Currently in Pain? No/denies            [facility] PT Assessment - 12/28/16 0001      Assessment   Medical Diagnosis vertigo   Referring Provider Dr. Colin Benton   Onset Date/Surgical Date 12/02/16   Prior Therapy years ago for vertigo     Precautions   Precautions Fall     Balance Screen   Has the patient fallen in the past 6 months Yes   How many times? more than 5   Has the patient had a decrease in activity level because of a fear of falling?  Yes   Is the patient reluctant to leave their home because of a fear of falling?  Yes  does not feel safe going down steps alone     Home Social worker Private residence   Living Arrangements Spouse/significant other  Available Help at Discharge Family   Type of [place] to enter   Entrance Stairs-Number of Steps 3   Entrance Stairs-Rails None   Home Layout Two level  bedroom on first level; but clothes on 2nd floor   Alternate Level Stairs-Number of Steps 12   Alternate Level Stairs-Rails Left   [place] - single point;Walker - 4 wheels  3 wheeled walker (from mother)   Additional Comments tub/shower combo     Prior Function   Level of Independence Independent     Cognition   Overall Cognitive Status Within Functional Limits for tasks assessed  She does confirm her answers with husband     Observation/Other Assessments   Observations Walks into clinic in 3 inch heels. Appears steady.   Focus on Therapeutic Outcomes (FOTO)  Physical Functional Status= 45/100; Dizziness Handicap Inventory = 86/100 (severe dizziness)     Posture/Postural  Control   Posture/Postural Control No significant limitations   Posture Comments Head midline, no tilt     ROM / Strength   AROM / PROM / Strength AROM     AROM   AROM Assessment Site Cervical   Cervical Flexion WNL   Cervical Extension WNL   Cervical - Right Rotation ~70   Cervical - Left Rotation ~60     Bed Mobility   Bed Mobility Rolling Right;Rolling Left;Right Sidelying to Sit;Left Sidelying to Sit;Sit to Supine;Supine to Sit   Rolling Right 7: Independent   Rolling Left 7: Independent   Right Sidelying to Sit 7: Independent   Left Sidelying to Sit 7: Independent   Supine to Sit 7: Independent   Sit to Supine 7: Independent     Transfers   Transfers Sit to Stand;Stand to Sit;Stand Pivot Transfers   Sit to Stand 7: Independent   Stand to Sit 7: Independent   Stand Pivot Transfers 7: Independent     Ambulation/Gait   Ambulation/Gait Yes   Ambulation/Gait Assistance 7: Independent   Ambulation Distance (Feet) 60 Feet  x2   Assistive device None   Gait Pattern Within Functional Limits            Vestibular Assessment - 12/28/16 0001      Symptom Behavior   Type of Dizziness Spinning   Frequency of Dizziness multiple times per day   Duration of Dizziness seconds   Aggravating Factors Turning body quickly;Turning head quickly;Turning head sideways;Supine to sit;Sit to stand   Relieving Factors Head stationary;Closing eyes;Slow movements     Occulomotor Exam   Occulomotor Alignment Normal   Spontaneous Absent     Positional Testing   Dix-Hallpike Dix-Hallpike Right;Dix-Hallpike Left   Horizontal Canal Testing Horizontal Canal Right;Horizontal Canal Left     Dix-Hallpike Right   Dix-Hallpike Right Duration 0   Dix-Hallpike Right Symptoms No nystagmus     Dix-Hallpike Left   Dix-Hallpike Left Duration 30 seconds  +spinning sensation, unable to visualize nystagmus    Dix-Hallpike Left Symptoms Other (comment)  +spinning     Horizontal Canal Right    Horizontal Canal Right Duration 0   Horizontal Canal Right Symptoms Normal     Horizontal Canal Left   Horizontal Canal Left Duration 0   Horizontal Canal Left Symptoms Normal     Positional Sensitivities   Supine to Sitting Mild dizziness   Up from Left Hallpike Mild dizziness  PT Education - 12/28/16 1512    Education provided Yes   Education Details What BPPV is and why she feels spinning sensation. Due to multiple occurrences she is at risk for future episodes. [employer] handout provided.   Person(s) Educated Patient;Spouse   Methods Explanation;Demonstration;Handout   Comprehension Verbalized understanding          PT Short Term Goals - 12/28/16 1944      PT SHORT TERM GOAL #1   Title see LTGs           PT Long Term Goals - 12/28/16 1945      PT LONG TERM GOAL #1   Title Patient will demonstrate improved functional status as reflected by increased Functional Status score to 60/100 (baseline 45)  Target: 01/11/17   Time 2   Period Weeks   Status New     PT LONG TERM GOAL #2   Title Patient will report 0/10 vertigo when turning her head, turning while walking, or rolling over.    Time 2   Period Weeks   Status New     PT LONG TERM GOAL #3   Title Patient will demonstrate improved functional status as reflected by decreasing DIzziness Handicap score to 60/100 (baseline 86).   Time 2   Period Weeks   Status New               Plan - 12/28/16 1517    Clinical Impression Statement Patient presents with history of hospitalization 12/02/16 for dizziness, nausea, vomiting. Reports she has had vertigo/spinning since that admission. At follow-up office visit, her primary care doctor performed Hallpike and elicited vertigo with referral to PT. Patient with + left Dix-Hallpike (vertigo, however no nystagmus perceived by examiner--likely due to accomodation). Right Dix-Hallpike and bilateral supine headroll tests negative. Epley  maneuver performed for Lt posterior canal BPPV with patient experiencing vertigo for at most 30 seconds during position changes. Repeat Epley with no vertigo elicited. Advised patient to schedule 1-2 follow-up visits for further vestibular rehab if indicated. Instructed to call and cancel these appointments if she is asymtomatic.    Rehab Potential Excellent   PT Frequency 2x / week   PT Duration 2 weeks   PT Treatment/Interventions Canalith Repostioning;Therapeutic activities;Therapeutic exercise;Balance training;Neuromuscular re-education;Patient/family education;Vestibular   PT Next Visit Plan assess for BPPV (Lt posterior canal on eval 1/30); further assess balance, if indicated; instruct in [place] and Agree with Plan of Care Patient;Family member/caregiver      Patient will benefit from skilled therapeutic intervention in order to improve the following deficits and impairments:  Decreased activity tolerance, Decreased balance, Dizziness, Other (comment) (vertigo)  Visit Diagnosis: BPPV (benign paroxysmal positional vertigo), left  Dizziness and giddiness  Other abnormalities of gait and mobility   PT G-Codes  Functional Assessment Tool Used (Outpatient Only) +Dix-Hallpike Lt posterior canal; performed Lt Epley maneuver byLynnPSasser,PT [id number]/[id number]/[id number]   Functional Limitation Changing and maintaining body position   Changing and Maintaining Body Position Current Status [id number]) At least 20 percent but less than 40 percent impaired, limited or restricted byLynnPSasser,PT at01/30/181750   Changing and Maintaining Body Position Goal Status [id number]) 0 percent impaired, limited or restricted   Changing and Maintaining Body Position Discharge Status [id number])       Problem List Patient Active Problem List   Diagnosis Date Noted  . Chronic pain 11/19/2016  . Orthostatic hypotension 11/19/2016  . Acute hyponatremia 11/19/2016   . Malnutrition  of moderate degree 11/08/2016  . Hypercalcemia 09/03/2015  . MDD (major depressive disorder), recurrent severe, without psychosis ([place]) 04/09/2015  . Stroke ([place]) 04/07/2015  . Osteoporosis 07/24/2014  . Migraine - followed by Dr. Sima Matas in Neurology 05/24/2014  . Anxiety and depression - managed at [facility] 05/24/2014  . GERD (gastroesophageal reflux disease) 05/24/2014  . Macular degeneration - goes to [facility] for this 05/24/2014    Danielle Gaines, PT 12/28/2016, 7:56 PM  [place] [address] [place], [place], [address] Phone: [phone]   Fax:  [phone]  Name: Danielle Gaines MRN: [id number] Date of Birth: Oct 21, 1941

## 2016-12-30 DIAGNOSIS — G43709 Chronic migraine without aura, not intractable, without status migrainosus: Secondary | ICD-10-CM | POA: Diagnosis not present

## 2017-01-04 ENCOUNTER — Encounter: Payer: 59 | Admitting: Physical Therapy

## 2017-01-05 ENCOUNTER — Ambulatory Visit: Payer: 59 | Admitting: Physical Therapy

## 2017-01-11 DIAGNOSIS — H348332 Tributary (branch) retinal vein occlusion, bilateral, stable: Secondary | ICD-10-CM | POA: Diagnosis not present

## 2017-01-11 DIAGNOSIS — Z9889 Other specified postprocedural states: Secondary | ICD-10-CM | POA: Diagnosis not present

## 2017-01-11 DIAGNOSIS — H34833 Tributary (branch) retinal vein occlusion, bilateral, with macular edema: Secondary | ICD-10-CM | POA: Diagnosis not present

## 2017-01-11 DIAGNOSIS — H3581 Retinal edema: Secondary | ICD-10-CM | POA: Diagnosis not present

## 2017-01-11 DIAGNOSIS — H30033 Focal chorioretinal inflammation, peripheral, bilateral: Secondary | ICD-10-CM | POA: Diagnosis not present

## 2017-01-11 DIAGNOSIS — Z961 Presence of intraocular lens: Secondary | ICD-10-CM | POA: Diagnosis not present

## 2017-01-11 DIAGNOSIS — H35342 Macular cyst, hole, or pseudohole, left eye: Secondary | ICD-10-CM | POA: Diagnosis not present

## 2017-01-11 DIAGNOSIS — H35371 Puckering of macula, right eye: Secondary | ICD-10-CM | POA: Diagnosis not present

## 2017-01-13 DIAGNOSIS — M5030 Other cervical disc degeneration, unspecified cervical region: Secondary | ICD-10-CM | POA: Diagnosis not present

## 2017-01-19 ENCOUNTER — Encounter: Payer: Self-pay | Admitting: Physical Therapy

## 2017-01-19 ENCOUNTER — Ambulatory Visit: Payer: 59 | Attending: Family Medicine | Admitting: Physical Therapy

## 2017-01-19 DIAGNOSIS — R42 Dizziness and giddiness: Secondary | ICD-10-CM

## 2017-01-19 DIAGNOSIS — H8112 Benign paroxysmal vertigo, left ear: Secondary | ICD-10-CM | POA: Insufficient documentation

## 2017-01-19 DIAGNOSIS — M542 Cervicalgia: Secondary | ICD-10-CM | POA: Insufficient documentation

## 2017-01-19 DIAGNOSIS — R2689 Other abnormalities of gait and mobility: Secondary | ICD-10-CM | POA: Diagnosis not present

## 2017-01-19 NOTE — Patient Instructions (Signed)
Gaze Stabilization: Tip Card  1.Target must remain in focus, not blurry, and appear stationary while head is in motion. 2.Perform exercises with small head movements (45 to either side of midline). 3.Increase speed of head motion so long as target is in focus. 4.If you wear eyeglasses, be sure you can see target through lens (therapist will give specific instructions for bifocal / progressive lenses). 5.These exercises may provoke dizziness or nausea. Work through these symptoms. If too dizzy, slow head movement slightly. Rest between each exercise. 6.Exercises demand concentration; avoid distractions.  Copyright  VHI. All rights reserved.     Special Instructions: Exercises may bring on mild to moderate symptoms of headache/vision changes that resolve within 30 minutes of completing exercises. If symptoms are lasting longer than 30 minutes, modify your exercises by:  >decreasing the # of times you complete each activity >ensuring your symptoms return to baseline before moving onto the next exercise >dividing up exercises so you do not do them all in one session, but multiple short sessions throughout the day >doing them once a day until symptoms improve   Gaze Stabilization: Sitting    Keeping eyes on target on wall 2-3 feet away, and move head side to side for _30-60___ seconds. Repeat while moving head up and down for __30-60__ seconds. Do __2-3__ sessions per day.  Copyright  VHI. All rights reserved.    Gaze Stabilization - Tip Card  For safety, perform standing exercises close to a counter, wall, corner, or next to someone.   Gaze Stabilization - Standing Feet Apart   Feet shoulder width apart, keeping eyes on target on wall 3 feet away, tilt head down slightly and move head side to side for 30 seconds. Repeat while moving head up and down for 30 seconds. *Work up to tolerating 60 seconds, as able. Do 2-3 sessions per day.   Copyright  VHI. All rights reserved.      Before stretching, use heating pad for 5-10 minutes.  AROM: Neck Rotation    Turn head slowly to look over one shoulder to feel a light stretch. Hold 30 seconds. Then turn the other way for 30 seconds. Repeat __3__ times each direction.http://orth.exer.us/295   Copyright  VHI. All rights reserved.   Axial Extension (Chin Tuck)    Sit in straight chair against wall. Pull chin in and lengthen back of neck trying to touch the back of your head to the wall. Hold _30___ seconds  Repeat __3__ times. Do __2__ sessions per day.  http://[email or web address].exer.us/450   Copyright  VHI. All rights reserved.

## 2017-01-19 NOTE — Therapy (Signed)
[facility] [address] [place], [place], [address] Phone: [phone]   Fax:  [phone]  Physical Therapy Treatment  Patient Details  Name: Danielle Gaines MRN: [id number] Date of Birth: Jun 05, 1941 Referring Provider: Dr. Colin Benton  Encounter Date: 01/19/2017      PT End of Session - 01/19/17 1444    Visit Number 2   Number of Visits 8   Date for PT Re-Evaluation 03/04/17   Authorization Type vertigo [employer] 18 $40 copay 20 visit limit for PT   Authorization Time Period 01/19/17 to 03/17/17   Authorization - Visit Number 2   Authorization - Number of Visits 20   PT Start Time 1016   PT Stop Time 1102   PT Time Calculation (min) 46 min   Activity Tolerance Patient tolerated treatment well;No increased pain   Behavior During Therapy WFL for tasks assessed/performed      Past Medical History:  Diagnosis Date  . Arthritis   . Benzodiazepine dependence ([place])   . Carotid arterial disease (HCC)    mild  . Chronic pain    managed by pain clinic  . Colon abnormality    didn't work right so part of it was removed at [facility]  . Depression    managed by Dr. Toy Care  . Diastolic heart failure ([place])    mild on echo 2016  . GERD (gastroesophageal reflux disease)   . IBS (irritable bowel syndrome)    sees Dr. Earlean Shawl  . Macular degeneration    goes to [facility] for this  . Migraine    managed by neurologist  . Osteoporosis     Past Surgical History:  Procedure Laterality Date  . ABDOMINAL ADHESION SURGERY    . ABDOMINAL HYSTERECTOMY    . APPENDECTOMY    . CHOLECYSTECTOMY    . COLON SURGERY    . CYSTOSCOPY W/ URETERAL STENT PLACEMENT Right 04/27/2016   Procedure: CYSTOSCOPY WITH RETROGRADE PYELOGRAM/ RIGHT URETERAL STENT PLACEMENT;  Surgeon: Ardis Hughs, MD;  Location: [facility];  Service: Urology;  Laterality: Right;  . OVARIAN CYST SURGERY    . SMALL INTESTINE SURGERY      There were no vitals filed  for this visit.      Subjective Assessment - 01/19/17 1027    Subjective Reports she cancelled last appt due to episode of severe arthritis that started in her neck, to shoulders, to spine, to hips and "just couldn't move." Saw her orthopedist who did cortisone injections for both hips with some improvement. After last PT session, she felt good until that evening and dizziness/imbalance returned. "But not the nausea! Which is good." She reports intermittent buzzing/"static" sound in her head that has no particular pattern. She reports severe pain at the base of her skull/top of her neck and cannot tolerate lying supine because it is so severe. She denies any falls since last visit (3 weeks ago).   Patient is accompained by: Family member  husband   Pertinent History migraines, arthritis, macular degeneration (undergoing treatment currently--shots),    Limitations Lifting;Other (comment)  sleeping   How long can you sit comfortably? varies, position she tolerates best   Patient Stated Goals to get rid of the pain/headache at the base of the skull and improve balance   Currently in Pain? Yes   Pain Score 5    Pain Location Neck   Pain Orientation Posterior;Upper   Pain Descriptors / Indicators Aching;Headache   Pain Type Acute pain  Pain Onset More than a month ago   Pain Frequency Intermittent  nearly constant   Aggravating Factors  lying supine with pressure on area is worst "I become paralyzed by the pain and can't move"   Pain Relieving Factors sitting, rest, pain medicine helps slightly   Effect of Pain on Daily Activities "When it's bad, I can't do anything."            [facility] PT Assessment - 01/19/17 0001      AROM   AROM Assessment Site Cervical   Cervical Flexion ~30*; limited by upper neck pain   Cervical Extension ~45   Cervical - Right Rotation ~30   Cervical - Left Rotation ~45            Vestibular Assessment - 01/19/17 1432      Vestibular Assessment    General Observation Walks into clinic in ~2" heels. No imbalance noted. Moving with very little rotation of head or torso     Symptom Behavior   Type of Dizziness Unsteady with head/body turns   Duration of Dizziness seconds   Aggravating Factors Turning body quickly;Turning head quickly   Relieving Factors Head stationary;Rest     Occulomotor Exam   Occulomotor Alignment Normal   Spontaneous Absent     Vestibulo-Occular Reflex   VOR 1 Head Only (x 1 viewing) intact, however notes blurring of "A" with horizontal turns, tilting/moving "A" with vertical turns     Visual Acuity   Static line 3   Dynamic line 2     Positional Testing   Sidelying Test Sidelying Right;Sidelying Left     Sidelying Right   Sidelying Right Duration 0   Sidelying Right Symptoms No nystagmus     Sidelying Left   Sidelying Left Duration 0   Sidelying Left Symptoms No nystagmus                 [facility] Adult PT Treatment/Exercise - 01/19/17 0001      Exercises   Exercises Neck     Neck Exercises: Seated   Neck Retraction 5 reps;10 secs   Cervical Rotation Both;Other reps (comment)  x 20 sec; 1 rep for education         Vestibular Treatment/Exercise - 01/19/17 0001      Vestibular Treatment/Exercise   Vestibular Treatment Provided Gaze   Gaze Exercises X1 Viewing Horizontal;X1 Viewing Vertical     X1 Viewing Horizontal   Foot Position seated   Time --  30 sec   Reps 2   Comments notes blurring of object     X1 Viewing Vertical   Foot Position seated   Time --  30 sec   Reps 2   Comments notes tilting of A               PT Education - 01/19/17 1442    Education provided Yes   Education Details HEP (see instructions); importance of increasing movement of neck to avoid worsening pain; ? buzzing sounds related to migraines (as well as imbalance); take note if symptoms improve when she takes her migraine medicine (she only uses med 9x per month)   Person(s) Educated  Patient;Spouse   Methods Explanation;Demonstration;Verbal cues;Tactile cues;Handout   Comprehension Verbalized understanding;Need further instruction;Returned demonstration;Verbal cues required;Tactile cues required          PT Short Term Goals - 01/19/17 1459      PT SHORT TERM GOAL #1   Title Complete Functional Gait Assessment and set  LTG if appropriate.  TARGET 02/11/17   Time 3   Period Weeks   Status New     PT SHORT TERM GOAL #2   Title Patient will demonstrate independence with HEP to address cervical ROM and pain, as well as balance.    Time 3   Period Weeks   Status New     PT SHORT TERM GOAL #3   Title Patient will report upper cervical pain has decreased to <=3/10 with resulting improved sleep by 50%.   Time 3   Period Weeks   Status New           PT Long Term Goals - 01/19/17 1458      PT LONG TERM GOAL #1   Title Patient will demonstrate improved functional status as reflected by increased Functional Status score to 60/100 (baseline 45)  Target: 01/11/17; UPDATED TARGET 03/04/17   Time 6   Period Weeks   Status On-going     PT LONG TERM GOAL #2   Title Patient will report 0/10 vertigo when turning her head, turning while walking, or rolling over.    Time 6   Period Weeks   Status On-going     PT LONG TERM GOAL #3   Title Patient will demonstrate improved functional status as reflected by decreasing DIzziness Handicap score to 60/100 (baseline 86).   Time 6   Period Weeks   Status On-going               Plan - 01/19/17 1448    Clinical Impression Statement Patient returns after evaluation 3 weeks ago for vertigo and treated with Lt Epley maneuver. She reports no further spinning, however continues to have imbalance (especially with head/body turns), intermittent buzzing in her head (she is unsure if this correlates with periods of imbalance), and severe pain upper cervical spine (since she fell and hit her head). She has since seen her  neurologist for her migraines with no change in medicine recommended. Patient with +visual changes with VORx1 exercise. Will submit a new certification requesting 1x/week x 6 weeks for additional balance and vestibular rehab.   Rehab Potential Good   Clinical Impairments Affecting Rehab Potential migraines, severe arthritis currently limiting cervical ROM   PT Frequency 1x / week  (pt reports she cannot make 2x/week due to multiple MD appts and dealing with her sick aunt)   PT Duration 6 weeks   PT Treatment/Interventions Canalith Repostioning;Therapeutic activities;Therapeutic exercise;Balance training;Neuromuscular re-education;Patient/family education;Vestibular;ADLs/Self Care Home Management;Biofeedback;Moist Heat;Gait training;Functional mobility training   PT Next Visit Plan check HEP and if cervical ROM and/or pain improving; complete FGA and set goal as appropriate; give corner balance ex's   PT Home Exercise Plan VOR x 1 seated; neck rotation; chin tuck   Consulted and Agree with Plan of Care Patient;Family member/caregiver   Family Member Consulted husband      Patient will benefit from skilled therapeutic intervention in order to improve the following deficits and impairments:  Decreased activity tolerance, Decreased balance, Dizziness, Other (comment) (vertigo)  Visit Diagnosis: BPPV (benign paroxysmal positional vertigo), left  Dizziness and giddiness  Other abnormalities of gait and mobility  Cervical pain     Problem List Patient Active Problem List   Diagnosis Date Noted  . Chronic pain 11/19/2016  . Orthostatic hypotension 11/19/2016  . Acute hyponatremia 11/19/2016  . Malnutrition of moderate degree 11/08/2016  . Hypercalcemia 09/03/2015  . MDD (major depressive disorder), recurrent severe, without psychosis ([place]) 04/09/2015  . Stroke (  [place]) 04/07/2015  . Osteoporosis 07/24/2014  . Migraine - followed by Dr. Sima Matas in Neurology 05/24/2014  . Anxiety and  depression - managed at [facility] 05/24/2014  . GERD (gastroesophageal reflux disease) 05/24/2014  . Macular degeneration - goes to [facility] for this 05/24/2014    Rexanne Mano, PT 01/19/2017, 3:12 PM  [facility] [address] [place], [place], [address] Phone: [phone]   Fax:  [phone]  Name: Danielle Gaines MRN: [id number] Date of Birth: June 24, 1941

## 2017-01-26 ENCOUNTER — Ambulatory Visit: Payer: 59 | Admitting: Physical Therapy

## 2017-01-26 ENCOUNTER — Encounter: Payer: Self-pay | Admitting: Physical Therapy

## 2017-01-26 DIAGNOSIS — R2689 Other abnormalities of gait and mobility: Secondary | ICD-10-CM | POA: Diagnosis not present

## 2017-01-26 DIAGNOSIS — H8112 Benign paroxysmal vertigo, left ear: Secondary | ICD-10-CM | POA: Diagnosis not present

## 2017-01-26 DIAGNOSIS — R42 Dizziness and giddiness: Secondary | ICD-10-CM | POA: Diagnosis not present

## 2017-01-26 DIAGNOSIS — M542 Cervicalgia: Secondary | ICD-10-CM | POA: Diagnosis not present

## 2017-01-26 NOTE — Therapy (Signed)
[facility] [address] [place], [place], [address] Phone: [phone]   Fax:  [phone]  Physical Therapy Treatment  Patient Details  Name: Danielle Gaines MRN: [id number] Date of Birth: 1941/07/12 Referring Provider: Dr. Colin Benton  Encounter Date: 01/26/2017      PT End of Session - 01/26/17 1157    Visit Number 3   Number of Visits 8   Date for PT Re-Evaluation 03/04/17   Authorization Type vertigo [employer] 18 $40 copay 20 visit limit for PT   Authorization Time Period 01/19/17 to 03/17/17   Authorization - Visit Number 2   Authorization - Number of Visits 20   PT Start Time 1101   PT Stop Time 1120   PT Time Calculation (min) 19 min   Activity Tolerance Patient tolerated treatment well;No increased pain   Behavior During Therapy WFL for tasks assessed/performed  smiling, nodding; turns body to see PT sitting next to her      Past Medical History:  Diagnosis Date  . Arthritis   . Benzodiazepine dependence ([place])   . Carotid arterial disease (HCC)    mild  . Chronic pain    managed by pain clinic  . Colon abnormality    didn't work right so part of it was removed at [facility]  . Depression    managed by Dr. Toy Care  . Diastolic heart failure ([place])    mild on echo 2016  . GERD (gastroesophageal reflux disease)   . IBS (irritable bowel syndrome)    sees Dr. Earlean Shawl  . Macular degeneration    goes to [facility] for this  . Migraine    managed by neurologist  . Osteoporosis     Past Surgical History:  Procedure Laterality Date  . ABDOMINAL ADHESION SURGERY    . ABDOMINAL HYSTERECTOMY    . APPENDECTOMY    . CHOLECYSTECTOMY    . COLON SURGERY    . CYSTOSCOPY W/ URETERAL STENT PLACEMENT Right 04/27/2016   Procedure: CYSTOSCOPY WITH RETROGRADE PYELOGRAM/ RIGHT URETERAL STENT PLACEMENT;  Surgeon: Ardis Hughs, MD;  Location: [facility];  Service: Urology;  Laterality: Right;  . OVARIAN CYST SURGERY    .  SMALL INTESTINE SURGERY      There were no vitals filed for this visit.      Subjective Assessment - 01/26/17 1143    Subjective Reports her dizziness has been so much better since our last session. She attempted to incr her neck ROM as we last discussed and she could barely tolerate rotation, flex/extension made pain worse, and did not try lateral flexion. We discussed if she thinks she pushed too far too fast and importance of working to the point she feels a mild stretch. Discussed "fine line" with arthritis, not moving makes it worse and moving too much makes it worse, but exercise can make it better. Pt reports she has an appt tomorrow with her arthritis doctor to address incr pain in all her joints (neck is the worst). Encouraged her to ask for a PT referral for her neck pain and that her needs could likely best be met at an orthopedic focused PT office. She asked to keep her case "open" with me in case she has problems again with her dizziness. Explained that I had received approval from MD for 6 weeks and can return during that time. Afterwards would need a new referral.    Patient is accompained by: Family member  husband   Pertinent  History migraines, arthritis, macular degeneration (undergoing treatment currently--shots),    Limitations Lifting;Other (comment)  sleeping   How long can you sit comfortably? varies, position she tolerates best   Patient Stated Goals to get rid of the pain/headache at the base of the skull and improve balance   Currently in Pain? Yes   Pain Score --  not assessed, pt did not want me to even touch her neck, do heat, etc   Pain Location Neck   Pain Orientation Posterior   Pain Descriptors / Indicators Aching   Pain Type Chronic pain  described her chronic OA pain; no mention of occipital pain   Pain Onset More than a month ago   Pain Frequency Constant                                 PT Education - 01/26/17 1155     Education provided Yes   Education Details Educated on proper way to work on gentle ROM/stretching (again); ways exercise can benefit arthritis and effects of immobility; process for getting a referral from MD to address cervical pain; able to receive PT simultaneously from 2 PTs for separate diagnoses (if she goes to ortho office for cervical pain, could return here for dizziness)    Person(s) Educated Patient;Spouse   Methods Explanation;Demonstration   Comprehension Verbalized understanding          PT Short Term Goals - 01/19/17 1459      PT SHORT TERM GOAL #1   Title Complete Functional Gait Assessment and set LTG if appropriate.  TARGET 02/11/17   Time 3   Period Weeks   Status New     PT SHORT TERM GOAL #2   Title Patient will demonstrate independence with HEP to address cervical ROM and pain, as well as balance.    Time 3   Period Weeks   Status New     PT SHORT TERM GOAL #3   Title Patient will report upper cervical pain has decreased to <=3/10 with resulting improved sleep by 50%.   Time 3   Period Weeks   Status New           PT Long Term Goals - 01/19/17 1458      PT LONG TERM GOAL #1   Title Patient will demonstrate improved functional status as reflected by increased Functional Status score to 60/100 (baseline 45)  Target: 01/11/17; UPDATED TARGET 03/04/17   Time 6   Period Weeks   Status On-going     PT LONG TERM GOAL #2   Title Patient will report 0/10 vertigo when turning her head, turning while walking, or rolling over.    Time 6   Period Weeks   Status On-going     PT LONG TERM GOAL #3   Title Patient will demonstrate improved functional status as reflected by decreasing DIzziness Handicap score to 60/100 (baseline 86).   Time 6   Period Weeks   Status On-going               Plan - 01/26/17 1158    Clinical Impression Statement Patient presented with reports of significantly decr dizziness, however severe arthritis pain (neck being the  worst). (See subjective). Educated patient on HEP for cervical ROM (goal was to improve ROM to allow more effective VOR exercises/vestibular rehab). See pt education for other areas addressed. Plan is to cancel future PT appointments, however keep  her case active for 6 weeks in case she needs to return due to dizzy problem.    Rehab Potential Good   Clinical Impairments Affecting Rehab Potential migraines, severe arthritis currently limiting cervical ROM   PT Frequency 1x / week   PT Duration 6 weeks   PT Treatment/Interventions Canalith Repostioning;Therapeutic activities;Therapeutic exercise;Balance training;Neuromuscular re-education;Patient/family education;Vestibular;ADLs/Self Care Home Management;Biofeedback;Moist Heat;Gait training;Functional mobility training   PT Next Visit Plan check HEP and if cervical ROM and/or pain improving; complete FGA and set goal as appropriate; give corner balance ex's   PT Home Exercise Plan VOR x 1 seated; neck rotation; chin tuck   Consulted and Agree with Plan of Care Patient;Family member/caregiver   Family Member Consulted husband      Patient will benefit from skilled therapeutic intervention in order to improve the following deficits and impairments:  Decreased activity tolerance, Decreased balance, Dizziness, Other (comment) (vertigo)  Visit Diagnosis: Cervical pain  Dizziness and giddiness     Problem List Patient Active Problem List   Diagnosis Date Noted  . Chronic pain 11/19/2016  . Orthostatic hypotension 11/19/2016  . Acute hyponatremia 11/19/2016  . Malnutrition of moderate degree 11/08/2016  . Hypercalcemia 09/03/2015  . MDD (major depressive disorder), recurrent severe, without psychosis ([place]) 04/09/2015  . Stroke ([place]) 04/07/2015  . Osteoporosis 07/24/2014  . Migraine - followed by Dr. Sima Matas in Neurology 05/24/2014  . Anxiety and depression - managed at [facility] 05/24/2014  . GERD (gastroesophageal reflux disease)  05/24/2014  . Macular degeneration - goes to [facility] for this 05/24/2014    Rexanne Mano, PT 01/26/2017, 12:06 PM  [facility] [address] [place], [place], [address] Phone: [phone]   Fax:  [phone]  Name: Danielle Gaines MRN: [id number] Date of Birth: Mar 22, 1941

## 2017-01-27 ENCOUNTER — Other Ambulatory Visit: Payer: Self-pay | Admitting: Sports Medicine

## 2017-01-27 DIAGNOSIS — M5030 Other cervical disc degeneration, unspecified cervical region: Secondary | ICD-10-CM

## 2017-01-27 DIAGNOSIS — M542 Cervicalgia: Secondary | ICD-10-CM

## 2017-01-27 DIAGNOSIS — R5383 Other fatigue: Secondary | ICD-10-CM | POA: Diagnosis not present

## 2017-02-01 DIAGNOSIS — H34832 Tributary (branch) retinal vein occlusion, left eye, with macular edema: Secondary | ICD-10-CM | POA: Diagnosis not present

## 2017-02-01 DIAGNOSIS — H44113 Panuveitis, bilateral: Secondary | ICD-10-CM | POA: Insufficient documentation

## 2017-02-01 DIAGNOSIS — H348322 Tributary (branch) retinal vein occlusion, left eye, stable: Secondary | ICD-10-CM | POA: Diagnosis not present

## 2017-02-01 DIAGNOSIS — H3581 Retinal edema: Secondary | ICD-10-CM | POA: Diagnosis not present

## 2017-02-02 ENCOUNTER — Encounter: Payer: 59 | Admitting: Physical Therapy

## 2017-02-08 ENCOUNTER — Ambulatory Visit
Admission: RE | Admit: 2017-02-08 | Discharge: 2017-02-08 | Disposition: A | Discharge status: Home / Self Care | Payer: 59 | Source: Ambulatory Visit | Attending: Sports Medicine | Admitting: Sports Medicine

## 2017-02-08 DIAGNOSIS — M50222 Other cervical disc displacement at C5-C6 level: Secondary | ICD-10-CM | POA: Diagnosis not present

## 2017-02-08 DIAGNOSIS — M5030 Other cervical disc degeneration, unspecified cervical region: Secondary | ICD-10-CM

## 2017-02-08 DIAGNOSIS — M50221 Other cervical disc displacement at C4-C5 level: Secondary | ICD-10-CM | POA: Diagnosis not present

## 2017-02-08 DIAGNOSIS — M542 Cervicalgia: Secondary | ICD-10-CM

## 2017-02-09 ENCOUNTER — Encounter: Payer: 59 | Admitting: Physical Therapy

## 2017-02-11 DIAGNOSIS — M5030 Other cervical disc degeneration, unspecified cervical region: Secondary | ICD-10-CM | POA: Diagnosis not present

## 2017-02-15 ENCOUNTER — Telehealth: Payer: Self-pay | Admitting: Family Medicine

## 2017-02-16 ENCOUNTER — Encounter: Payer: 59 | Admitting: Physical Therapy

## 2017-02-21 NOTE — Progress Notes (Signed)
HPI:  Danielle Gaines is a pleasant 76 year old with a complicated past medical history significant for chronic pain managed by a specialist, major depressive disorder managed by her psychiatrist, IBS and nausea managed by her gastroenterologist, migraine managed by neurologist, chronic shortness of breath and orthostatic hypertension - seeing a cardiologist,  here for follow up. Chronic medical problems summarized above were reviewed for changes and stability and were updated as needed below. These issues and their treatment remain stable for the most part.  Denies CP, SOB, DOE, treatment intolerance or new symptoms. She has a new complaint of upper resp symptoms for 2 weeks. Symptoms include nasal congestion - thick at times, occ streaks of blood in mucus, occ chills, cough and occ streaks of blood in sputum. No vomiting, fevers, SOB, wheezing, rashes, sinus or tooth pain, change in bodyaches. She is seeing Dr. Manuella Ghazi at [facility] opthomology for macular degeneration and reports recently started on mycophenolate, an immunosuppressant, for this. Reports a number of labs were done prior to starting the medication and she has close follow up. She sees a dermatologist and agrees to regular skin checks on this medication. She has a lot of question regarding risks/benefit for her eye condition. She is seeing Dr. Cyd Silence for her arthritic pain and is considering inj in the neck. ? Due for colon ca screening and 2nd pneumonia vaccine. Declined vaccine today as not feeling great. Sees GI and agrees to call about colon cancer screening - report last colonoscopy postponed.  ROS: See pertinent positives and negatives per HPI.  Past Medical History:  Diagnosis Date  . Arthritis   . Benzodiazepine dependence (Danielle Gaines)   . Carotid arterial disease (HCC)    mild  . Chronic pain    managed by pain clinic  . Colon abnormality    didn't work right so part of it was removed at [facility]  . Depression    managed by  Dr. Toy Care  . Diastolic heart failure ([place])    mild on echo 2016  . GERD (gastroesophageal reflux disease)   . IBS (irritable bowel syndrome)    sees Dr. Earlean Shawl  . Macular degeneration    goes to [facility] for this  . Migraine    managed by neurologist  . Osteoporosis     Past Surgical History:  Procedure Laterality Date  . ABDOMINAL ADHESION SURGERY    . ABDOMINAL HYSTERECTOMY    . APPENDECTOMY    . CHOLECYSTECTOMY    . COLON SURGERY    . CYSTOSCOPY W/ URETERAL STENT PLACEMENT Right 04/27/2016   Procedure: CYSTOSCOPY WITH RETROGRADE PYELOGRAM/ RIGHT URETERAL STENT PLACEMENT;  Surgeon: Ardis Hughs, MD;  Location: [facility];  Service: Urology;  Laterality: Right;  . OVARIAN CYST SURGERY    . SMALL INTESTINE SURGERY      Family History  Problem Relation Age of Onset  . Kidney disease Mother   . Heart disease Mother   . Cancer Maternal Aunt     intestinal and liver cancer  . Stroke Maternal Grandmother   . Stroke    . Depression      everyone  . Hypercalcemia Neg Hx     Social History   Social History  . Marital status: Married    Spouse name: N/A  . Number of children: N/A  . Years of education: N/A   Social History Main Topics  . Smoking status: Never Smoker  . Smokeless tobacco: Never Used  . Alcohol use No  . Drug use:  No  . Sexual activity: No   Other Topics Concern  . None   Social History Narrative   Work or School: none      Home Situation: lives with husband      Spiritual Beliefs:       Lifestyle: no regular exercise; diet is ok              Current Outpatient Prescriptions:  .  acetaminophen (TYLENOL) 500 MG tablet, Take 1,000 mg by mouth every 6 (six) hours as needed for mild pain, moderate pain or headache. , Disp: , Rfl:  .  ADDERALL XR 30 MG 24 hr capsule, Take 30 mg by mouth daily. , Disp: , Rfl: 0 .  alendronate (FOSAMAX) 70 MG tablet, Take 70 mg by mouth every Saturday. Take with a full glass of water on an empty stomach.,  Disp: , Rfl:  .  amphetamine-dextroamphetamine (ADDERALL XR) 30 MG 24 hr capsule, Take by mouth., Disp: , Rfl:  .  Biotin 1000 MCG tablet, Take 1,000 mcg by mouth daily., Disp: , Rfl:  .  cycloSPORINE (RESTASIS) 0.05 % ophthalmic emulsion, Place 1 drop into both eyes 2 (two) times daily. , Disp: , Rfl:  .  linaclotide (LINZESS) 290 MCG CAPS capsule, Take by mouth., Disp: , Rfl:  .  LINZESS 290 MCG CAPS capsule, Take 290 mcg by mouth daily before breakfast. , Disp: , Rfl: 0 .  LORazepam (ATIVAN) 0.5 MG tablet, Take 0.5 mg by mouth 3 (three) times daily., Disp: , Rfl:  .  metaxalone (SKELAXIN) 800 MG tablet, Take 800 mg by mouth., Disp: , Rfl:  .  mycophenolate (CELLCEPT) 500 MG tablet, Take 1,000 mg by mouth., Disp: , Rfl:  .  olopatadine (PATANOL) 0.1 % ophthalmic solution, 1 drop 2 (two) times daily., Disp: , Rfl:  .  omeprazole (PRILOSEC) 40 MG capsule, Take 40 mg by mouth daily. , Disp: , Rfl: 0 .  SUMAtriptan (IMITREX) 100 MG tablet, Take 100 mg by mouth every 2 (two) hours as needed for migraine. , Disp: , Rfl:  .  topiramate (TOPAMAX) 50 MG tablet, Take 50 mg by mouth daily., Disp: , Rfl:  .  traMADol (ULTRAM) 50 MG tablet, Take 50 mg by mouth every 6 (six) hours as needed., Disp: , Rfl:  .  traZODone (DESYREL) 50 MG tablet, 2 qhs, Disp: , Rfl:  .  ZENPEP 15000 units CPEP, Take 45,000 mg by mouth 3 (three) times daily before meals. , Disp: , Rfl: 0 .  benzonatate (TESSALON PERLES) 100 MG capsule, Take 1 capsule (100 mg total) by mouth 3 (three) times daily as needed for cough., Disp: 20 capsule, Rfl: 0 .  doxycycline (VIBRA-TABS) 100 MG tablet, Take 1 tablet (100 mg total) by mouth 2 (two) times daily., Disp: 14 tablet, Rfl: 0  EXAM:  Vitals:   02/22/17 1125  BP: 108/70  Pulse: 88  Temp: 97.7 F (36.5 C)    Body mass index is 21.2 kg/m.  GENERAL: vitals reviewed and listed above, alert, oriented, appears well hydrated and in no acute distress  HEENT: atraumatic, conjunttiva  clear, no obvious abnormalities on inspection of external nose and ears, normal appearance of ear canals and TMs, thick nasal congestion, mild post oropharyngeal erythema with PND, cracked nasal membrane L with sm amount dried blood, no tonsillar edema or exudate, no sinus TTP  NECK: no obvious masses on inspection  LUNGS: clear to auscultation bilaterally, no wheezes, rales or rhonchi, good air movement  CV: HRRR, no peripheral edema  MS: moves all extremities without noticeable abnormality  PSYCH: pleasant and cooperative, no obvious depression or anxiety  ASSESSMENT AND PLAN:  Discussed the following assessment and plan:  Respiratory infection  Hemoptysis - Plan: DG Chest 2 View  Immunosuppression (HCC)  Anxiety and depression - managed at [facility]  Other migraine without status migrainosus, not intractable  MDD (major depressive disorder), recurrent severe, without psychosis ([place])  -higher risks for sinus infection given medications and given symptoms for several weeks opted to do abx after discussion risks. Follow up as needed if worsening or not improving. -will get CXR given hemoptysis thought likely from dry nasal membranes -continue care with specialists and advised she see her opthomologist regarding questions about her medication he is prescribing for the eye condition. Advised she get regular skin checks and ensure her prescriber is checking regular labs. -Patient advised to return or notify a doctor immediately if symptoms worsen or persist or new concerns arise.  Patient Instructions  BEFORE YOU LEAVE: -follow up: Medicare exam with Manuela Schwartz in August and follow up with Dr. Maudie Mercury  Go get the xray.  I sent the antibiotic to the pharmacy. I sent tessalon to the pharmacy for cough.  Nasal saline 1-2 times daily.  Get regular skin checks with dermatologist.  See the gastroenterologist about colon cancer screening.  Discuss your concerns regarding you eye  treatments with your opthomologist.  I hope you are feeling better soon! Seek care immediately if worsening, new concerns or you are not improving with treatment.     Colin Benton R., DO

## 2017-02-22 ENCOUNTER — Ambulatory Visit (INDEPENDENT_AMBULATORY_CARE_PROVIDER_SITE_OTHER)
Admission: RE | Admit: 2017-02-22 | Discharge: 2017-02-22 | Disposition: A | Discharge status: Home / Self Care | Payer: 59 | Source: Ambulatory Visit | Attending: Family Medicine | Admitting: Family Medicine

## 2017-02-22 ENCOUNTER — Encounter: Payer: Self-pay | Admitting: Family Medicine

## 2017-02-22 ENCOUNTER — Ambulatory Visit (INDEPENDENT_AMBULATORY_CARE_PROVIDER_SITE_OTHER): Payer: 59 | Admitting: Family Medicine

## 2017-02-22 VITALS — BP 108/70 | HR 88 | Temp 97.7°F | Ht 61.0 in | Wt 112.2 lb

## 2017-02-22 DIAGNOSIS — F329 Major depressive disorder, single episode, unspecified: Secondary | ICD-10-CM

## 2017-02-22 DIAGNOSIS — F418 Other specified anxiety disorders: Secondary | ICD-10-CM

## 2017-02-22 DIAGNOSIS — F332 Major depressive disorder, recurrent severe without psychotic features: Secondary | ICD-10-CM

## 2017-02-22 DIAGNOSIS — F419 Anxiety disorder, unspecified: Secondary | ICD-10-CM

## 2017-02-22 DIAGNOSIS — R042 Hemoptysis: Secondary | ICD-10-CM

## 2017-02-22 DIAGNOSIS — G43809 Other migraine, not intractable, without status migrainosus: Secondary | ICD-10-CM | POA: Diagnosis not present

## 2017-02-22 DIAGNOSIS — D849 Immunodeficiency, unspecified: Secondary | ICD-10-CM

## 2017-02-22 DIAGNOSIS — F32A Depression, unspecified: Secondary | ICD-10-CM

## 2017-02-22 DIAGNOSIS — D899 Disorder involving the immune mechanism, unspecified: Secondary | ICD-10-CM

## 2017-02-22 DIAGNOSIS — J988 Other specified respiratory disorders: Secondary | ICD-10-CM

## 2017-02-22 MED ORDER — DOXYCYCLINE HYCLATE 100 MG PO TABS: 100.0000 mg | ORAL_TABLET | Freq: Two times a day (BID) | ORAL | 0 refills | Status: DC

## 2017-02-22 MED ORDER — BENZONATATE 100 MG PO CAPS: 100.0000 mg | ORAL_CAPSULE | Freq: Three times a day (TID) | ORAL | 0 refills | Status: DC | PRN

## 2017-02-22 NOTE — Progress Notes (Signed)
Pre visit review using our clinic review tool, if applicable. No additional management support is needed unless otherwise documented below in the visit note. 

## 2017-02-22 NOTE — Patient Instructions (Addendum)
BEFORE YOU LEAVE: -follow up: Medicare exam with Manuela Schwartz in August and follow up with Dr. Maudie Mercury  Go get the xray.  I sent the antibiotic to the pharmacy. I sent tessalon to the pharmacy for cough.  Nasal saline 1-2 times daily.  Get regular skin checks with dermatologist.  See the gastroenterologist about colon cancer screening.  Discuss your concerns regarding you eye treatments with your opthomologist.  I hope you are feeling better soon! Seek care immediately if worsening, new concerns or you are not improving with treatment.

## 2017-02-23 ENCOUNTER — Encounter: Payer: 59 | Admitting: Physical Therapy

## 2017-03-01 DIAGNOSIS — H34833 Tributary (branch) retinal vein occlusion, bilateral, with macular edema: Secondary | ICD-10-CM | POA: Diagnosis not present

## 2017-03-01 DIAGNOSIS — H44113 Panuveitis, bilateral: Secondary | ICD-10-CM | POA: Diagnosis not present

## 2017-03-01 DIAGNOSIS — H35342 Macular cyst, hole, or pseudohole, left eye: Secondary | ICD-10-CM | POA: Diagnosis not present

## 2017-03-01 DIAGNOSIS — H35371 Puckering of macula, right eye: Secondary | ICD-10-CM | POA: Diagnosis not present

## 2017-03-01 DIAGNOSIS — Z961 Presence of intraocular lens: Secondary | ICD-10-CM | POA: Diagnosis not present

## 2017-03-02 ENCOUNTER — Encounter: Payer: 59 | Admitting: Physical Therapy

## 2017-03-02 DIAGNOSIS — H35371 Puckering of macula, right eye: Secondary | ICD-10-CM | POA: Diagnosis not present

## 2017-03-02 DIAGNOSIS — H3581 Retinal edema: Secondary | ICD-10-CM | POA: Diagnosis not present

## 2017-03-03 ENCOUNTER — Other Ambulatory Visit: Payer: Self-pay | Admitting: Family Medicine

## 2017-03-03 NOTE — Telephone Encounter (Signed)
I called the pt to check on her due to refill requests that were sent in for the antibiotic and cough pill as she may need an appt. Patient stated she does not need refills on this and she does need refills on Phenergan and will request this from her GI doctor.

## 2017-04-06 DIAGNOSIS — M47812 Spondylosis without myelopathy or radiculopathy, cervical region: Secondary | ICD-10-CM | POA: Diagnosis not present

## 2017-04-06 DIAGNOSIS — G629 Polyneuropathy, unspecified: Secondary | ICD-10-CM | POA: Diagnosis not present

## 2017-04-07 DIAGNOSIS — M47812 Spondylosis without myelopathy or radiculopathy, cervical region: Secondary | ICD-10-CM | POA: Diagnosis not present

## 2017-04-12 NOTE — Telephone Encounter (Signed)
Called to see if pt wanted to schedule awv - left message.  

## 2017-04-19 DIAGNOSIS — H3581 Retinal edema: Secondary | ICD-10-CM | POA: Diagnosis not present

## 2017-04-19 DIAGNOSIS — H44113 Panuveitis, bilateral: Secondary | ICD-10-CM | POA: Diagnosis not present

## 2017-04-19 DIAGNOSIS — H35342 Macular cyst, hole, or pseudohole, left eye: Secondary | ICD-10-CM | POA: Diagnosis not present

## 2017-04-19 DIAGNOSIS — H35371 Puckering of macula, right eye: Secondary | ICD-10-CM | POA: Diagnosis not present

## 2017-04-19 DIAGNOSIS — H348332 Tributary (branch) retinal vein occlusion, bilateral, stable: Secondary | ICD-10-CM | POA: Diagnosis not present

## 2017-04-26 ENCOUNTER — Other Ambulatory Visit: Payer: Self-pay | Admitting: Endocrinology

## 2017-04-26 NOTE — Telephone Encounter (Signed)
Please advise if ok to refill. Rx is under a historical provider.

## 2017-04-26 NOTE — Telephone Encounter (Signed)
Please refill x 3 mos Ov is due 

## 2017-05-17 DIAGNOSIS — H35342 Macular cyst, hole, or pseudohole, left eye: Secondary | ICD-10-CM | POA: Diagnosis not present

## 2017-05-17 DIAGNOSIS — H3581 Retinal edema: Secondary | ICD-10-CM | POA: Diagnosis not present

## 2017-05-17 DIAGNOSIS — H35371 Puckering of macula, right eye: Secondary | ICD-10-CM | POA: Diagnosis not present

## 2017-05-17 DIAGNOSIS — H348332 Tributary (branch) retinal vein occlusion, bilateral, stable: Secondary | ICD-10-CM | POA: Diagnosis not present

## 2017-05-17 DIAGNOSIS — H30033 Focal chorioretinal inflammation, peripheral, bilateral: Secondary | ICD-10-CM | POA: Diagnosis not present

## 2017-05-17 DIAGNOSIS — H30031 Focal chorioretinal inflammation, peripheral, right eye: Secondary | ICD-10-CM | POA: Diagnosis not present

## 2017-05-17 DIAGNOSIS — H44113 Panuveitis, bilateral: Secondary | ICD-10-CM | POA: Diagnosis not present

## 2017-05-19 ENCOUNTER — Telehealth: Payer: Self-pay | Admitting: Family Medicine

## 2017-05-19 ENCOUNTER — Other Ambulatory Visit: Payer: Self-pay | Admitting: Family Medicine

## 2017-05-19 DIAGNOSIS — Z1231 Encounter for screening mammogram for malignant neoplasm of breast: Secondary | ICD-10-CM

## 2017-05-19 NOTE — Telephone Encounter (Signed)
Please see message below, please advise 

## 2017-05-19 NOTE — Telephone Encounter (Addendum)
Patient's husband is requesting a good dermatologist for patient to go to so he can make her an appt.  Patient is also wanting to get a bone density scan done when she is getting her mammogram.  Her mammogram is on 06/02/17 but needs to go ahead and schedule the bone density if she is getting it done that day.

## 2017-05-19 NOTE — Telephone Encounter (Signed)
Recommend the [facility] practices for dermatology. Please place order for dexa for osteoporosis.

## 2017-05-20 ENCOUNTER — Other Ambulatory Visit: Payer: Self-pay

## 2017-05-20 DIAGNOSIS — M810 Age-related osteoporosis without current pathological fracture: Secondary | ICD-10-CM

## 2017-05-20 NOTE — Telephone Encounter (Signed)
Spoke with patient.  Please schedule patient for DEXA.  Order placed

## 2017-05-30 ENCOUNTER — Encounter: Payer: Self-pay | Admitting: Physical Therapy

## 2017-05-30 NOTE — Therapy (Signed)
[facility] [address] [place], [place], [address] Phone: [phone]   Fax:  [phone]  Patient Details  Name: Danielle Gaines MRN: [id number] Date of Birth: November 02, 1941 Referring Provider:  No ref. provider found  Encounter Date: 05/30/2017   PT LONG TERM GOAL #1  Title  01/19/17 1458: Patient will demonstrate improved functional status as reflected by increased Functional Status score to 60/100 (baseline 45) Target: 2/ 13/18; UPDATED TARGET 03/04/17  Time  01/19/17 1458: 6  Period  01/19/17 1458: [place]  Status  01/19/17 1458: On-going  PT LONG TERM GOAL #2  Title  01/19/17 1458: Patient will report 0/10 vertigo when turning her head, turning while walking, or rolling over.   Time  01/19/17 1458: 6  Period  01/19/17 1458: [place]  Status  01/19/17 1458: On-going  PT LONG TERM GOAL #3  Title  01/19/17 1458: Patient will demonstrate improved functional status as reflected by decreasing DIzziness Handicap score to 60/100 (baseline 86).  Time  01/19/17 1458: 6  Period  01/19/17 1458: [place]  Status  01/19/17 1458: On-going    PHYSICAL THERAPY DISCHARGE SUMMARY  Visits from Start of Care: 3  Current functional level related to goals / functional outcomes:   Remaining deficits: Neck and joint pain limiting all activity   Education / Equipment: HEP  Plan: Patient agrees to discharge.  Patient goals were partially met. Patient is being discharged due to not returning since the last visit.  ?????      Rexanne Mano, PT 05/30/2017, 8:19 AM  [facility] [address] [place], [place], [address] Phone: [phone]   Fax:  [phone]

## 2017-06-02 ENCOUNTER — Ambulatory Visit
Admission: RE | Admit: 2017-06-02 | Discharge: 2017-06-02 | Disposition: A | Discharge status: Home / Self Care | Payer: 59 | Source: Ambulatory Visit | Attending: Family Medicine | Admitting: Family Medicine

## 2017-06-02 DIAGNOSIS — Z1231 Encounter for screening mammogram for malignant neoplasm of breast: Secondary | ICD-10-CM

## 2017-06-14 DIAGNOSIS — H35371 Puckering of macula, right eye: Secondary | ICD-10-CM | POA: Diagnosis not present

## 2017-06-14 DIAGNOSIS — H44133 Sympathetic uveitis, bilateral: Secondary | ICD-10-CM | POA: Diagnosis not present

## 2017-06-14 DIAGNOSIS — H348332 Tributary (branch) retinal vein occlusion, bilateral, stable: Secondary | ICD-10-CM | POA: Diagnosis not present

## 2017-06-14 DIAGNOSIS — H35342 Macular cyst, hole, or pseudohole, left eye: Secondary | ICD-10-CM | POA: Diagnosis not present

## 2017-06-14 DIAGNOSIS — H3581 Retinal edema: Secondary | ICD-10-CM | POA: Diagnosis not present

## 2017-07-13 ENCOUNTER — Other Ambulatory Visit: Payer: Self-pay

## 2017-07-13 ENCOUNTER — Telehealth: Payer: Self-pay

## 2017-07-13 MED ORDER — ALENDRONATE SODIUM 70 MG PO TABS: 70.0000 mg | ORAL_TABLET | ORAL | 0 refills | Status: DC

## 2017-07-13 NOTE — Telephone Encounter (Signed)
Left patient VM to call back & make an appt. For further refills of fosamax since she hadn't been seen since 2016.

## 2017-07-14 ENCOUNTER — Ambulatory Visit (INDEPENDENT_AMBULATORY_CARE_PROVIDER_SITE_OTHER): Payer: 59 | Admitting: Family Medicine

## 2017-07-14 ENCOUNTER — Ambulatory Visit: Payer: 59 | Admitting: Family Medicine

## 2017-07-14 ENCOUNTER — Encounter: Payer: Self-pay | Admitting: Family Medicine

## 2017-07-14 VITALS — BP 118/80 | HR 102 | Temp 98.5°F | Ht 61.0 in | Wt 109.0 lb

## 2017-07-14 DIAGNOSIS — F332 Major depressive disorder, recurrent severe without psychotic features: Secondary | ICD-10-CM | POA: Diagnosis not present

## 2017-07-14 DIAGNOSIS — F329 Major depressive disorder, single episode, unspecified: Secondary | ICD-10-CM

## 2017-07-14 DIAGNOSIS — J0110 Acute frontal sinusitis, unspecified: Secondary | ICD-10-CM | POA: Diagnosis not present

## 2017-07-14 DIAGNOSIS — F419 Anxiety disorder, unspecified: Secondary | ICD-10-CM

## 2017-07-14 DIAGNOSIS — R040 Epistaxis: Secondary | ICD-10-CM | POA: Diagnosis not present

## 2017-07-14 MED ORDER — AMOXICILLIN-POT CLAVULANATE 875-125 MG PO TABS: 1.0000 | ORAL_TABLET | Freq: Two times a day (BID) | ORAL | 0 refills | Status: DC

## 2017-07-14 NOTE — Progress Notes (Signed)
HPI:  Acute visit for sinus congestion and nose bleeds: -started: several weeks ago -symptoms:nasal congestion, sore throat, cough - chronic but thicker and discolored mucus recent and several nose bleeds from blowing nose, chills occ, sinus pain -denies:fever, SOB, NVD, tooth pain -has tried: nothing -sick contacts/travel/risks: no reported flu, strep or tick exposure -Hx of: allergies unfortunately under a lot of stress lately care for homeless and alcoholic sister whom is unpleasant - this has caused a good deal of angst - seeing psychiatrist soon, o/w mood good; seeing ortho and PMR for aches and pains  ROS: See pertinent positives and negatives per HPI.  Past Medical History:  Diagnosis Date  . Arthritis   . Benzodiazepine dependence ([place])   . Carotid arterial disease (HCC)    mild  . Chronic pain    managed by pain clinic  . Colon abnormality    didn't work right so part of it was removed at [facility]  . Depression    managed by Dr. Toy Care  . Diastolic heart failure ([place])    mild on echo 2016  . GERD (gastroesophageal reflux disease)   . IBS (irritable bowel syndrome)    sees Dr. Earlean Shawl  . Macular degeneration    goes to [facility] for this  . Migraine    managed by neurologist  . Osteoporosis     Past Surgical History:  Procedure Laterality Date  . ABDOMINAL ADHESION SURGERY    . ABDOMINAL HYSTERECTOMY    . APPENDECTOMY    . CHOLECYSTECTOMY    . COLON SURGERY    . CYSTOSCOPY W/ URETERAL STENT PLACEMENT Right 04/27/2016   Procedure: CYSTOSCOPY WITH RETROGRADE PYELOGRAM/ RIGHT URETERAL STENT PLACEMENT;  Surgeon: Ardis Hughs, MD;  Location: [facility];  Service: Urology;  Laterality: Right;  . OVARIAN CYST SURGERY    . SMALL INTESTINE SURGERY      Family History  Problem Relation Age of Onset  . Kidney disease Mother   . Heart disease Mother   . Cancer Maternal Aunt        intestinal and liver cancer  . Stroke Maternal Grandmother   . Stroke Unknown    . Depression Unknown        everyone  . Hypercalcemia Neg Hx     Social History   Social History  . Marital status: Married    Spouse name: N/A  . Number of children: N/A  . Years of education: N/A   Social History Main Topics  . Smoking status: Never Smoker  . Smokeless tobacco: Never Used  . Alcohol use No  . Drug use: No  . Sexual activity: No   Other Topics Concern  . None   Social History Narrative   Work or School: none      Home Situation: lives with husband      Spiritual Beliefs:       Lifestyle: no regular exercise; diet is ok              Current Outpatient Prescriptions:  .  acetaminophen (TYLENOL) 500 MG tablet, Take 1,000 mg by mouth every 6 (six) hours as needed for mild pain, moderate pain or headache. , Disp: , Rfl:  .  ADDERALL XR 30 MG 24 hr capsule, Take 30 mg by mouth daily. , Disp: , Rfl: 0 .  alendronate (FOSAMAX) 70 MG tablet, Take 1 tablet (70 mg total) by mouth once a week. Take with a full glass of water on an empty stomach.,  Disp: 12 tablet, Rfl: 0 .  amphetamine-dextroamphetamine (ADDERALL XR) 30 MG 24 hr capsule, Take by mouth., Disp: , Rfl:  .  Biotin 1000 MCG tablet, Take 1,000 mcg by mouth daily., Disp: , Rfl:  .  cycloSPORINE (RESTASIS) 0.05 % ophthalmic emulsion, Place 1 drop into both eyes 2 (two) times daily. , Disp: , Rfl:  .  LINZESS 290 MCG CAPS capsule, Take 290 mcg by mouth daily before breakfast. , Disp: , Rfl: 0 .  metaxalone (SKELAXIN) 800 MG tablet, Take 800 mg by mouth., Disp: , Rfl:  .  olopatadine (PATANOL) 0.1 % ophthalmic solution, 1 drop 2 (two) times daily., Disp: , Rfl:  .  omeprazole (PRILOSEC) 40 MG capsule, Take 40 mg by mouth daily. , Disp: , Rfl: 0 .  SUMAtriptan (IMITREX) 100 MG tablet, Take 100 mg by mouth every 2 (two) hours as needed for migraine. , Disp: , Rfl:  .  topiramate (TOPAMAX) 50 MG tablet, Take 50 mg by mouth daily., Disp: , Rfl:  .  traZODone (DESYREL) 50 MG tablet, 2 qhs, Disp: , Rfl:  .   ZENPEP 15000 units CPEP, Take 45,000 mg by mouth 3 (three) times daily before meals. , Disp: , Rfl: 0 .  amoxicillin-clavulanate (AUGMENTIN) 875-125 MG tablet, Take 1 tablet by mouth 2 (two) times daily., Disp: 20 tablet, Rfl: 0  EXAM:  Vitals:   07/14/17 1300  BP: 118/80  Pulse: (!) 102  Temp: 98.5 F (36.9 C)  SpO2: 98%    Body mass index is 20.6 kg/m.  GENERAL: vitals reviewed and listed above, alert, oriented, appears well hydrated and in no acute distress  HEENT: atraumatic, conjunttiva clear, no obvious abnormalities on inspection of external nose and ears, normal appearance of ear canals and TMs, clear nasal congestion, mild post oropharyngeal erythema with PND, no tonsillar edema or exudate, no sinus TTP  NECK: no obvious masses on inspection  LUNGS: clear to auscultation bilaterally, no wheezes, rales or rhonchi, good air movement  CV: HRRR, no peripheral edema  MS: moves all extremities without noticeable abnormality  PSYCH: pleasant and cooperative, no obvious depression or anxiety  ASSESSMENT AND PLAN:  Discussed the following assessment and plan:  Acute non-recurrent frontal sinusitis  Nosebleed  Anxiety and depression - managed at [facility]  MDD (major depressive disorder), recurrent severe, without psychosis ([place])  -we discussed possible serious and likely etiologies, workup and treatment, treatment risks and return precautions with sinusitis possible given duration of symptoms, worsening, discolored discharge and sinus discomfort -after this discussion, Danielle Gaines opted for treatment with augmentin -follow up advised as needed and for physical in 1 month -seeing psychiatry for management anx/dep -of course, we advised Danielle Gaines  to return or notify a doctor immediately if symptoms worsen or persist or new concerns arise.   Patient Instructions  BEFORE YOU LEAVE: -follow up: 30 minute with Dr. Thane Edu with Danielle Gaines in 1 month - fasting for labs if  possible  Start the Augmentin.  Get follow up with psychiatrist to help you with the recent stressors.  I hope you are feeling better soon! Seek care sooner if worsening, new concerns or you are not improving with treatment.      Danielle Benton R., DO

## 2017-07-14 NOTE — Patient Instructions (Addendum)
BEFORE YOU LEAVE: -follow up: 30 minute with Dr. Thane Edu with susan in 1 month - fasting for labs if possible  Start the Augmentin.  Get follow up with psychiatrist to help you with the recent stressors.  I hope you are feeling better soon! Seek care sooner if worsening, new concerns or you are not improving with treatment.

## 2017-07-26 ENCOUNTER — Ambulatory Visit: Payer: 59 | Admitting: Family Medicine

## 2017-08-17 ENCOUNTER — Encounter: Payer: Self-pay | Admitting: Family Medicine

## 2017-08-17 NOTE — Progress Notes (Signed)
HPI:  Danielle Gaines is a pleasant 76 y.o. here for follow up. Chronic medical problems summarized below were reviewed for changes. Was seeing many specialist and I noted concern for polypharmacy when she initially became my patient. She Has cut back on medications since.  Reports her PA at her neurology office tried to start her on Neurontin recently. This medication made her sick, so she discontinued it. We talked again about polypharmacy and caution with starting any new medications. She reports a history of chest wall pain for greater than 10 years, with intermittent flares of pain around the bra band. She does wear a bra with underwire. This is not an exertional type of chest pain. She does not have any associated change in her breathing or swelling or palpitations. She can feel with certain twisting motions of the chest wall. She denies any ongoing cough or congestion or fevers, hemoptysis or worsening shortness of breath. She does months check a chest x-ray since she was sick recently. Due for labs. Seeing [name] today for preventive care measures, discussion of colon cancer screening and vaccines.  dCHF, chronic dyspnea, orthostatic hypotension, hx mild carotid art dz: -sees cardiologist -meds: none  IBS, chronic nausea: -sees gastroenterologist for management, Dr. Earlean Shawl -meds: linzess, omeprazole, zenpep  Osteoporosis: -meds: fosamax  Macular degeneration: -managed by Dr. Fredric Mare  MDD: -sees psychiatry, Dr. Toy Care, for management -meds: trazadone, adderall,   Migraine: -sees neurologist -meds: topamax, imitrex prn  Chronic pain: -sees pain mangement -meds: skelaxin  ROS: See pertinent positives and negatives per HPI.  Past Medical History:  Diagnosis Date  . Arthritis   . Benzodiazepine dependence ([place])   . Carotid arterial disease ([facility])    mild  . Chronic pain    managed by pain clinic  . Colon abnormality    didn't work right so part of it was removed  at [facility]  . Depression    managed by Dr. Toy Care  . Diastolic heart failure ([place])    mild on echo 2016  . GERD (gastroesophageal reflux disease)   . IBS (irritable bowel syndrome)    sees Dr. Earlean Shawl  . Macular degeneration    goes to [facility] [place] for this  . Migraine    managed by neurologist  . Osteoporosis   . Vertigo     Past Surgical History:  Procedure Laterality Date  . ABDOMINAL ADHESION SURGERY    . ABDOMINAL HYSTERECTOMY    . APPENDECTOMY    . CHOLECYSTECTOMY    . COLON SURGERY    . CYSTOSCOPY W/ URETERAL STENT PLACEMENT Right 04/27/2016   Procedure: CYSTOSCOPY WITH RETROGRADE PYELOGRAM/ RIGHT URETERAL STENT PLACEMENT;  Surgeon: Ardis Hughs, MD;  Location: [facility];  Service: Urology;  Laterality: Right;  . OVARIAN CYST SURGERY    . SMALL INTESTINE SURGERY      Family History  Problem Relation Age of Onset  . Kidney disease Mother   . Heart disease Mother   . Cancer Maternal Aunt        intestinal and liver cancer  . Stroke Maternal Grandmother   . Stroke Unknown   . Depression Unknown        everyone  . Hypercalcemia Neg Hx     Social History   Social History  . Marital status: Married    Spouse name: Danielle Gaines  . Number of children: Danielle Gaines  . Years of education: Danielle Gaines   Social History Main Topics  . Smoking status: Never Smoker  .  Smokeless tobacco: Never Used  . Alcohol use No  . Drug use: No  . Sexual activity: No   Other Topics Concern  . None   Social History Narrative   Work or School: none      Home Situation: lives with husband      Spiritual Beliefs:       Lifestyle: no regular exercise; diet is ok              Current Outpatient Prescriptions:  .  acetaminophen (TYLENOL) 500 MG tablet, Take 1,000 mg by mouth every 6 (six) hours as needed for mild pain, moderate pain or headache. , Disp: , Rfl:  .  ADDERALL XR 30 MG 24 hr capsule, Take 30 mg by mouth daily. , Disp: , Rfl: 0 .  alendronate (FOSAMAX) 70 MG tablet, Take 1 tablet  (70 mg total) by mouth once a week. Take with a full glass of water on an empty stomach., Disp: 12 tablet, Rfl: 0 .  amphetamine-dextroamphetamine (ADDERALL XR) 30 MG 24 hr capsule, Take by mouth., Disp: , Rfl:  .  Biotin 1000 MCG tablet, Take 1,000 mcg by mouth daily., Disp: , Rfl:  .  cycloSPORINE (RESTASIS) 0.05 % ophthalmic emulsion, Place 1 drop into both eyes 2 (two) times daily. , Disp: , Rfl:  .  DULoxetine (CYMBALTA) 30 MG capsule, Take 30 mg by mouth every morning., Disp: , Rfl:  .  LINZESS 290 MCG CAPS capsule, Take 290 mcg by mouth daily before breakfast. , Disp: , Rfl: 0 .  LORazepam (ATIVAN) 0.5 MG tablet, Take 0.5 mg by mouth 3 (three) times daily as needed for anxiety., Disp: , Rfl:  .  metaxalone (SKELAXIN) 800 MG tablet, Take 800 mg by mouth., Disp: , Rfl:  .  olopatadine (PATANOL) 0.1 % ophthalmic solution, 1 drop 2 (two) times daily., Disp: , Rfl:  .  omeprazole (PRILOSEC) 40 MG capsule, Take 40 mg by mouth daily. , Disp: , Rfl: 0 .  SUMAtriptan (IMITREX) 100 MG tablet, Take 100 mg by mouth every 2 (two) hours as needed for migraine. , Disp: , Rfl:  .  topiramate (TOPAMAX) 50 MG tablet, Take 50 mg by mouth daily., Disp: , Rfl:  .  traZODone (DESYREL) 50 MG tablet, 2 qhs, Disp: , Rfl:  .  ZENPEP 15000 units CPEP, Take 45,000 mg by mouth 3 (three) times daily before meals. , Disp: , Rfl: 0  EXAM:  Vitals:   08/18/17 1014  BP: 118/74  Pulse: 96  Temp: 97.9 F (36.6 C)  SpO2: 96%    Body mass index is 20.9 kg/m.  GENERAL: vitals reviewed and listed above, alert, oriented, appears well hydrated and in no acute distress  HEENT: atraumatic, conjunttiva clear, no obvious abnormalities on inspection of external nose and ears  NECK: no obvious masses on inspection  LUNGS: clear to auscultation bilaterally, no wheezes, rales or rhonchi, good air movement  CV: HRRR, no peripheral edema  MS: moves all extremities without noticeable abnormality, tenderness to palpation  around the per all day and in the area of her concern, reproduction of her symptoms with this and with twisting of the thoracic chest wall  PSYCH: pleasant and cooperative, no obvious depression or anxiety  ASSESSMENT AND PLAN:  Discussed the following assessment and plan:  Medicare annual wellness visit, subsequent -see notes by Manuela Schwartz   Chest wall pain -Suspect muscular or related to her bra band, reproducible on exam so less likely any other etiology. Also  less likely any other etiology given ongoing for greater than 10 years. However, we will obtain a chest x-ray and EKG (NSR, no acute changes from prior)and I did advise that she follow up with her cardiologist if symptoms persist. -advised could use NSAIDs cautiously, heat and gentle stretching and also consider bras without underwire.  Other chronic pain -discussed complex issues with management of chronic pain. Advised healthy diet, regular exercise, cognitive behavioral therapy and stress management. Conservative treatment as needed. Orthostatic hypotension  Osteoporosis, unspecified osteoporosis type, unspecified pathological fracture presence -Will discuss at preventive visit  MDD (major depressive disorder), recurrent severe, without psychosis ([place]) -sees psychiatry for management  Other migraine without status migrainosus, not intractable -Stable  -Patient advised to return or notify a doctor immediately if symptoms worsen or persist or new concerns arise.  Patient Instructions   BEFORE YOU LEAVE: -EKG - chest wall pain -xray sheet -AWV and vaccines with Manuela Schwartz -labs -follow up: 3-4 months   Heat, gentle stretching and aleve (in moderation as needed for 1-2 weeks) for the chest wall pain. Get the chest xray and also follow up with your cardiologist.  We have ordered labs or studies at this visit. It can take up to 1-2 weeks for results and processing. IF results require follow up or explanation, we will call you  with instructions. Clinically stable results will be released to your [facility]. If you have not heard from [place] or cannot find your results in [facility] in 2 weeks please contact our office at [phone].  If you are not yet signed up for [facility], please consider signing up.   Ms. Deshmukh , Thank you for taking time to come for your Medicare Wellness Visit. I appreciate your ongoing commitment to your health goals. Please review the following plan we discussed and let me know if I can assist you in the future.   Will ask Dr. Earlean Shawl to send Dr. Maudie Mercury a copy of your last colonoscopy and the plan for future colonoscopy   Will take your flu vaccine and your last pneumonia vaccine   [facility] will call you for the bone density in November. If they do not, call them, as your order is here. Manuela Schwartz will let them know this is due    These are the goals we discussed: Goals    None      This is a list of the screening recommended for you and due dates:  Health Maintenance  Topic Date Due  . Colon Cancer Screening  08/02/1993  . Pneumonia vaccines (2 of 2 - PPSV23) 10/02/2015  . Flu Shot  06/29/2017  . Mammogram  06/03/2019  . Tetanus Vaccine  07/16/2025  . DEXA scan (bone density measurement)  Completed            Colin Benton R., DO

## 2017-08-18 ENCOUNTER — Telehealth: Payer: Self-pay

## 2017-08-18 ENCOUNTER — Encounter: Payer: Self-pay | Admitting: Family Medicine

## 2017-08-18 ENCOUNTER — Ambulatory Visit (INDEPENDENT_AMBULATORY_CARE_PROVIDER_SITE_OTHER): Payer: 59 | Admitting: Family Medicine

## 2017-08-18 ENCOUNTER — Ambulatory Visit (INDEPENDENT_AMBULATORY_CARE_PROVIDER_SITE_OTHER)
Admission: RE | Admit: 2017-08-18 | Discharge: 2017-08-18 | Disposition: A | Discharge status: Home / Self Care | Payer: 59 | Source: Ambulatory Visit | Attending: Family Medicine | Admitting: Family Medicine

## 2017-08-18 ENCOUNTER — Ambulatory Visit: Payer: 59 | Admitting: Family Medicine

## 2017-08-18 VITALS — BP 118/74 | HR 96 | Temp 97.9°F | Ht 61.0 in | Wt 110.6 lb

## 2017-08-18 DIAGNOSIS — G43809 Other migraine, not intractable, without status migrainosus: Secondary | ICD-10-CM | POA: Diagnosis not present

## 2017-08-18 DIAGNOSIS — I951 Orthostatic hypotension: Secondary | ICD-10-CM

## 2017-08-18 DIAGNOSIS — R0789 Other chest pain: Secondary | ICD-10-CM | POA: Diagnosis not present

## 2017-08-18 DIAGNOSIS — Z23 Encounter for immunization: Secondary | ICD-10-CM

## 2017-08-18 DIAGNOSIS — Z0000 Encounter for general adult medical examination without abnormal findings: Secondary | ICD-10-CM

## 2017-08-18 DIAGNOSIS — G8929 Other chronic pain: Secondary | ICD-10-CM

## 2017-08-18 DIAGNOSIS — M810 Age-related osteoporosis without current pathological fracture: Secondary | ICD-10-CM

## 2017-08-18 DIAGNOSIS — F332 Major depressive disorder, recurrent severe without psychotic features: Secondary | ICD-10-CM | POA: Diagnosis not present

## 2017-08-18 LAB — HEMOGLOBIN A1C: Hgb A1c MFr Bld: 5.3 % (ref 4.6–6.5)

## 2017-08-18 LAB — LIPID PANEL
Cholesterol: 173 mg/dL (ref 0–200)
HDL: 79.1 mg/dL (ref >39.00)
LDL Cholesterol: 73 mg/dL (ref 0–99)
NonHDL: 93.8
Total CHOL/HDL Ratio: 2
Triglycerides: 106 mg/dL (ref 0.0–149.0)
VLDL: 21.2 mg/dL (ref 0.0–40.0)

## 2017-08-18 LAB — EKG 12-LEAD

## 2017-08-18 NOTE — Telephone Encounter (Signed)
Call to the patient and LVM on identified machine. Dr. Maudie Mercury wrote order for dexa at [facility] and they will call her in Nov for her dexa screen.  Can call back for questions

## 2017-08-18 NOTE — Progress Notes (Signed)
The Patient was informed that the wellness visit is to identify future health risk and educate and initiate measures that can reduce risk for increased disease through the lifespan.    Annual Wellness Assessment  Reports health as fair  There are no preventive care reminders to display for this patient. Colonoscopy at Dr. Earlean Shawl; [place] (which [facility])  Left message for nurse to call back to direct line to note the date of her last colonoscopy and plan for repeat Colonoscopy postponed until Jan 1, as the patient is seeing Dr. Earlean Shawl on Tuesday and will request he send Dr. Maudie Mercury an update  Did take them every year because she has had most of her colon removed; now q 3 years She is going to see him Tuesday;   Will take your flu vaccine and your last pneumonia vaccine   Mammogram July 2018 Dexa /11/10/216 due this year   Preventive Screening -Counseling & Management  Medicare Annual Preventive Care Visit - Subsequent Last OV today   Describes Health as poor, fair, good or great? Fair C/o of some pulmonary issues Also has fibromyaligia Pain; 1-10; now 4 to 5   VS reviewed;   Diet  Usually eat raisin bread Lunch fruit; sandwich  Dinner; cook cauliflower; asparagus, chicken Does supplement with shakes    BMI 20   Exercise States she has OA., having injections to neck and spine 2nd injection and with fibro does not exercise much Does her house work and she cooks Yoga;   Hearing Screening Comments: Hearing is good  Vision Screening Comments: Vision issues; Has macular degeneration Trying to keep her vision; giving her injection in eyes q 5 weeks Dr. Manuella Ghazi; [facility]    Stressors: due to breath   Sleep patterns: sleeps well   Pain-4 to 5    Advanced Directives  Patient Care Team: Lucretia Kern, DO as PCP - General (Family Medicine) Richmond Campbell, MD as Consulting Physician (Gastroenterology) Sharyne Peach, MD as Consulting Physician  (Ophthalmology)  Memory issues are evaluated in regard to the following:  Issues making decisions:  Less interest in hobbies / activities:  Repeats questions, stories (family complaining):  Trouble using ordinary gadgets (microwave, computer, phone):  Forgets the month or year:   Mismanaging finances:   Remembering appts:  Daily problems with thinking and/or memory: Ad8 score is 0   It is helpful to do memory games; puzzles; Lumosity or brain teasers of your choice by book; online or other.     Patient Care Team: Lucretia Kern, DO as PCP - General (Family Medicine) Richmond Campbell, MD as Consulting Physician (Gastroenterology) Sharyne Peach, MD as Consulting Physician (Ophthalmology)    PCP Notes The patient seen Dr. Maudie Mercury who check social history, medical hx etc   Health Maintenance Manuela Schwartz to call Dr. Earlean Shawl office for colonoscopy plan Colonoscopy at Dr. Earlean Shawl; [place] (which has merged with [facility])  Left message for nurse to call back to direct line to note the date of her last colonoscopy and plan for repeat Colonoscopy postponed until Jan 1, as the patient is seeing Dr. Earlean Shawl on Tuesday and will request he send Dr. Maudie Mercury an update   Will take your flu vaccine and your last pneumonia vaccine  Today    Abnormal Screens  No   Referrals  Referred today for dexa scheduled for Roma Schanz to call [facility] and confirm the order  Call but could not confirm, the patient order is for elam Will call  her to confirm Noralee Space should call her in Nov.   Going to have a chest xray today    Patient concerns; Discussed with Dr. Maudie Mercury today   Nurse Concerns; As noted   Next PCP apt As scheduled  I have personally reviewed and noted the following in the patient's chart:   . Medical and social history . Use of alcohol, tobacco or illicit drugs  . Current medications and supplements . Functional ability and status . Nutritional status . Physical  activity . Advanced directives . List of other physicians . Hospitalizations, surgeries, and ER visits in previous 12 months . Vitals . Screenings to include cognitive, depression, and falls . Referrals and appointments  In addition, I have reviewed and discussed with patient certain preventive protocols, quality metrics, and best practice recommendations. A written personalized care plan for preventive services as well as general preventive health recommendations were provided to patient.     Wynetta Fines, RN  08/18/2017    Wynetta Fines RN

## 2017-08-18 NOTE — Patient Instructions (Addendum)
BEFORE YOU LEAVE: -EKG - chest wall pain -xray sheet -AWV and vaccines with Manuela Schwartz -labs -follow up: 3-4 months   Heat, gentle stretching and aleve (in moderation as needed for 1-2 weeks) for the chest wall pain. Get the chest xray and also follow up with your cardiologist.  We have ordered labs or studies at this visit. It can take up to 1-2 weeks for results and processing. IF results require follow up or explanation, we will call you with instructions. Clinically stable results will be released to your [facility]. If you have not heard from [place] or cannot find your results in [facility] in 2 weeks please contact our office at [phone].  If you are not yet signed up for [facility], please consider signing up.   Ms. Loveall , Thank you for taking time to come for your Medicare Wellness Visit. I appreciate your ongoing commitment to your health goals. Please review the following plan we discussed and let me know if I can assist you in the future.   Will ask Dr. Earlean Shawl to send Dr. Maudie Mercury a copy of your last colonoscopy and the plan for future colonoscopy   Will take your flu vaccine and your last pneumonia vaccine   [facility] will call you for the bone density in November. If they do not, call them, as your order is here. Manuela Schwartz will let them know this is due    These are the goals we discussed: Goals    . Exercise 150 minutes per week (moderate activity)          May try some yoga for stretching and relaxation ( u tube)        This is a list of the screening recommended for you and due dates:  Health Maintenance  Topic Date Due  . Colon Cancer Screening  08/02/1993  . Pneumonia vaccines (2 of 2 - PPSV23) 10/02/2015  . Flu Shot  06/29/2017  . Mammogram  06/03/2019  . Tetanus Vaccine  07/16/2025  . DEXA scan (bone density measurement)  Completed    Prevention of falls: Remove rugs or any tripping hazards in the home Use Non slip mats in bathtubs and showers Placing grab bars next to the  toilet and or shower Placing handrails on both sides of the stair way Adding extra lighting in the home.   Personal safety issues reviewed:  1. Consider starting a community watch program per [facility] 2.  Changes batteries is smoke detector and/or carbon monoxide detector  3.  If you have firearms; keep them in a safe place 4.  Wear protection when in the sun; Always wear sunscreen or a hat; It is good to have your doctor check your skin annually or review any new areas of concern 5. Driving safety; Keep in the right lane; stay 3 car lengths behind the car in front of you on the highway; look 3 times prior to pulling out; carry your cell phone everywhere you go!    Learn about the Yellow Dot program:  The program allows first responders at your emergency to have access to who your physician is, as well as your medications and medical conditions.  Citizens requesting the Yellow Dot Packages should contact Master Corporal Nunzio Cobbs at the [facility] [phone] for the first week of the program and beginning the week after Easter citizens should contact their Scientist, physiological.     Health Maintenance for Postmenopausal Women Menopause is a normal process in  which your reproductive ability comes to an end. This process happens gradually over a span of months to years, usually between the ages of 7 and 55. Menopause is complete when you have missed 12 consecutive menstrual periods. It is important to talk with your health care provider about some of the most common conditions that affect postmenopausal women, such as heart disease, cancer, and bone loss (osteoporosis). Adopting a healthy lifestyle and getting preventive care can help to promote your health and wellness. Those actions can also lower your chances of developing some of these common conditions. What should I know about menopause? During menopause, you may experience a number of  symptoms, such as:  Moderate-to-severe hot flashes.  Night sweats.  Decrease in sex drive.  Mood swings.  Headaches.  Tiredness.  Irritability.  Memory problems.  Insomnia.  Choosing to treat or not to treat menopausal changes is an individual decision that you make with your health care provider. What should I know about hormone replacement therapy and supplements? Hormone therapy products are effective for treating symptoms that are associated with menopause, such as hot flashes and night sweats. Hormone replacement carries certain risks, especially as you become older. If you are thinking about using estrogen or estrogen with progestin treatments, discuss the benefits and risks with your health care provider. What should I know about heart disease and stroke? Heart disease, heart attack, and stroke become more likely as you age. This may be due, in part, to the hormonal changes that your body experiences during menopause. These can affect how your body processes dietary fats, triglycerides, and cholesterol. Heart attack and stroke are both medical emergencies. There are many things that you can do to help prevent heart disease and stroke:  Have your blood pressure checked at least every 1-2 years. High blood pressure causes heart disease and increases the risk of stroke.  If you are 41-89 years old, ask your health care provider if you should take aspirin to prevent a heart attack or a stroke.  Do not use any tobacco products, including cigarettes, chewing tobacco, or electronic cigarettes. If you need help quitting, ask your health care provider.  It is important to eat a healthy diet and maintain a healthy weight. ? Be sure to include plenty of vegetables, fruits, low-fat dairy products, and lean protein. ? Avoid eating foods that are high in solid fats, added sugars, or salt (sodium).  Get regular exercise. This is one of the most important things that you can do for your  health. ? Try to exercise for at least 150 minutes each week. The type of exercise that you do should increase your heart rate and make you sweat. This is known as moderate-intensity exercise. ? Try to do strengthening exercises at least twice each week. Do these in addition to the moderate-intensity exercise.  Know your numbers.Ask your health care provider to check your cholesterol and your blood glucose. Continue to have your blood tested as directed by your health care provider.  What should I know about cancer screening? There are several types of cancer. Take the following steps to reduce your risk and to catch any cancer development as early as possible. Breast Cancer  Practice breast self-awareness. ? This means understanding how your breasts normally appear and feel. ? It also means doing regular breast self-exams. Let your health care provider know about any changes, no matter how small.  If you are 11 or older, have a clinician do a breast exam (clinical  breast exam or CBE) every year. Depending on your age, family history, and medical history, it may be recommended that you also have a yearly breast X-ray (mammogram).  If you have a family history of breast cancer, talk with your health care provider about genetic screening.  If you are at high risk for breast cancer, talk with your health care provider about having an MRI and a mammogram every year.  Breast cancer (BRCA) gene test is recommended for women who have family members with BRCA-related cancers. Results of the assessment will determine the need for genetic counseling and BRCA1 and for BRCA2 testing. BRCA-related cancers include these types: ? Breast. This occurs in males or females. ? Ovarian. ? Tubal. This may also be called fallopian tube cancer. ? Cancer of the abdominal or pelvic lining (peritoneal cancer). ? Prostate. ? Pancreatic.  Cervical, Uterine, and Ovarian Cancer Your health care provider may recommend  that you be screened regularly for cancer of the pelvic organs. These include your ovaries, uterus, and vagina. This screening involves a pelvic exam, which includes checking for microscopic changes to the surface of your cervix (Pap test).  For women ages 21-65, health care providers may recommend a pelvic exam and a Pap test every three years. For women ages 74-65, they may recommend the Pap test and pelvic exam, combined with testing for human papilloma virus (HPV), every five years. Some types of HPV increase your risk of cervical cancer. Testing for HPV may also be done on women of any age who have unclear Pap test results.  Other health care providers may not recommend any screening for nonpregnant women who are considered low risk for pelvic cancer and have no symptoms. Ask your health care provider if a screening pelvic exam is right for you.  If you have had past treatment for cervical cancer or a condition that could lead to cancer, you need Pap tests and screening for cancer for at least 20 years after your treatment. If Pap tests have been discontinued for you, your risk factors (such as having a new sexual partner) need to be reassessed to determine if you should start having screenings again. Some women have medical problems that increase the chance of getting cervical cancer. In these cases, your health care provider may recommend that you have screening and Pap tests more often.  If you have a family history of uterine cancer or ovarian cancer, talk with your health care provider about genetic screening.  If you have vaginal bleeding after reaching menopause, tell your health care provider.  There are currently no reliable tests available to screen for ovarian cancer.  Lung Cancer Lung cancer screening is recommended for adults 37-83 years old who are at high risk for lung cancer because of a history of smoking. A yearly low-dose CT scan of the lungs is recommended if you:  Currently  smoke.  Have a history of at least 30 pack-years of smoking and you currently smoke or have quit within the past 15 years. A pack-year is smoking an average of one pack of cigarettes per day for one year.  Yearly screening should:  Continue until it has been 15 years since you quit.  Stop if you develop a health problem that would prevent you from having lung cancer treatment.  Colorectal Cancer  This type of cancer can be detected and can often be prevented.  Routine colorectal cancer screening usually begins at age 33 and continues through age 46.  If you  have risk factors for colon cancer, your health care provider may recommend that you be screened at an earlier age.  If you have a family history of colorectal cancer, talk with your health care provider about genetic screening.  Your health care provider may also recommend using home test kits to check for hidden blood in your stool.  A small camera at the end of a tube can be used to examine your colon directly (sigmoidoscopy or colonoscopy). This is done to check for the earliest forms of colorectal cancer.  Direct examination of the colon should be repeated every 5-10 years until age 1. However, if early forms of precancerous polyps or small growths are found or if you have a family history or genetic risk for colorectal cancer, you may need to be screened more often.  Skin Cancer  Check your skin from head to toe regularly.  Monitor any moles. Be sure to tell your health care provider: ? About any new moles or changes in moles, especially if there is a change in a mole's shape or color. ? If you have a mole that is larger than the size of a pencil eraser.  If any of your family members has a history of skin cancer, especially at a young age, talk with your health care provider about genetic screening.  Always use sunscreen. Apply sunscreen liberally and repeatedly throughout the day.  Whenever you are outside, protect  yourself by wearing long sleeves, pants, a wide-brimmed hat, and sunglasses.  What should I know about osteoporosis? Osteoporosis is a condition in which bone destruction happens more quickly than new bone creation. After menopause, you may be at an increased risk for osteoporosis. To help prevent osteoporosis or the bone fractures that can happen because of osteoporosis, the following is recommended:  If you are 11-15 years old, get at least 1,000 mg of calcium and at least 600 mg of vitamin D per day.  If you are older than age 77 but younger than age 41, get at least 1,200 mg of calcium and at least 600 mg of vitamin D per day.  If you are older than age 13, get at least 1,200 mg of calcium and at least 800 mg of vitamin D per day.  Smoking and excessive alcohol intake increase the risk of osteoporosis. Eat foods that are rich in calcium and vitamin D, and do weight-bearing exercises several times each week as directed by your health care provider. What should I know about how menopause affects my mental health? Depression may occur at any age, but it is more common as you become older. Common symptoms of depression include:  Low or sad mood.  Changes in sleep patterns.  Changes in appetite or eating patterns.  Feeling an overall lack of motivation or enjoyment of activities that you previously enjoyed.  Frequent crying spells.  Talk with your health care provider if you think that you are experiencing depression. What should I know about immunizations? It is important that you get and maintain your immunizations. These include:  Tetanus, diphtheria, and pertussis (Tdap) booster vaccine.  Influenza every year before the flu season begins.  Pneumonia vaccine.  Shingles vaccine.  Your health care provider may also recommend other immunizations. This information is not intended to replace advice given to you by your health care provider. Make sure you discuss any questions you  have with your health care provider. Document Released: 01/07/2006 Document Revised: 06/04/2016 Document Reviewed: 08/19/2015 Elsevier Interactive Patient Education  2018 [place] Prevention in the Home Falls can cause injuries and can affect people from all age groups. There are many simple things that you can do to make your home safe and to help prevent falls. What can I do on the outside of my home?  Regularly repair the edges of walkways and driveways and fix any cracks.  Remove high doorway thresholds.  Trim any shrubbery on the main path into your home.  Use bright outdoor lighting.  Clear walkways of debris and clutter, including tools and rocks.  Regularly check that handrails are securely fastened and in good repair. Both sides of any steps should have handrails.  Install guardrails along the edges of any raised decks or porches.  Have leaves, snow, and ice cleared regularly.  Use sand or salt on walkways during winter months.  In the garage, clean up any spills right away, including grease or oil spills. What can I do in the bathroom?  Use night lights.  Install grab bars by the toilet and in the tub and shower. Do not use towel bars as grab bars.  Use non-skid mats or decals on the floor of the tub or shower.  If you need to sit down while you are in the shower, use a plastic, non-slip stool.  Keep the floor dry. Immediately clean up any water that spills on the floor.  Remove soap buildup in the tub or shower on a regular basis.  Attach bath mats securely with double-sided non-slip rug tape.  Remove throw rugs and other tripping hazards from the floor. What can I do in the bedroom?  Use night lights.  Make sure that a bedside light is easy to reach.  Do not use oversized bedding that drapes onto the floor.  Have a firm chair that has side arms to use for getting dressed.  Remove throw rugs and other tripping hazards from the  floor. What can I do in the kitchen?  Clean up any spills right away.  Avoid walking on wet floors.  Place frequently used items in easy-to-reach places.  If you need to reach for something above you, use a sturdy step stool that has a grab bar.  Keep electrical cables out of the way.  Do not use floor polish or wax that makes floors slippery. If you have to use wax, make sure that it is non-skid floor wax.  Remove throw rugs and other tripping hazards from the floor. What can I do in the stairways?  Do not leave any items on the stairs.  Make sure that there are handrails on both sides of the stairs. Fix handrails that are broken or loose. Make sure that handrails are as long as the stairways.  Check any carpeting to make sure that it is firmly attached to the stairs. Fix any carpet that is loose or worn.  Avoid having throw rugs at the top or bottom of stairways, or secure the rugs with carpet tape to prevent them from moving.  Make sure that you have a light switch at the top of the stairs and the bottom of the stairs. If you do not have them, have them installed. What are some other fall prevention tips?  Wear closed-toe shoes that fit well and support your feet. Wear shoes that have rubber soles or low heels.  When you use a stepladder, make sure that it is completely opened and that the sides are firmly locked. Have someone hold the  ladder while you are using it. Do not climb a closed stepladder.  Add color or contrast paint or tape to grab bars and handrails in your home. Place contrasting color strips on the first and last steps.  Use mobility aids as needed, such as canes, walkers, scooters, and crutches.  Turn on lights if it is dark. Replace any light bulbs that burn out.  Set up furniture so that there are clear paths. Keep the furniture in the same spot.  Fix any uneven floor surfaces.  Choose a carpet design that does not hide the edge of steps of a  stairway.  Be aware of any and all pets.  Review your medicines with your healthcare provider. Some medicines can cause dizziness or changes in blood pressure, which increase your risk of falling. Talk with your health care provider about other ways that you can decrease your risk of falls. This may include working with a physical therapist or trainer to improve your strength, balance, and endurance. This information is not intended to replace advice given to you by your health care provider. Make sure you discuss any questions you have with your health care provider. Document Released: 11/05/2002 Document Revised: 04/13/2016 Document Reviewed: 12/20/2014 Elsevier Interactive Patient Education  2017 [employer].

## 2017-08-30 DIAGNOSIS — H3581 Retinal edema: Secondary | ICD-10-CM | POA: Diagnosis not present

## 2017-08-30 DIAGNOSIS — H44113 Panuveitis, bilateral: Secondary | ICD-10-CM | POA: Diagnosis not present

## 2017-08-30 DIAGNOSIS — H35371 Puckering of macula, right eye: Secondary | ICD-10-CM | POA: Diagnosis not present

## 2017-08-30 DIAGNOSIS — H35342 Macular cyst, hole, or pseudohole, left eye: Secondary | ICD-10-CM | POA: Diagnosis not present

## 2017-08-30 DIAGNOSIS — H348332 Tributary (branch) retinal vein occlusion, bilateral, stable: Secondary | ICD-10-CM | POA: Diagnosis not present

## 2017-09-01 DIAGNOSIS — H9190 Unspecified hearing loss, unspecified ear: Secondary | ICD-10-CM | POA: Diagnosis not present

## 2017-09-01 DIAGNOSIS — R42 Dizziness and giddiness: Secondary | ICD-10-CM | POA: Diagnosis not present

## 2017-09-01 DIAGNOSIS — R251 Tremor, unspecified: Secondary | ICD-10-CM | POA: Diagnosis not present

## 2017-09-01 DIAGNOSIS — H903 Sensorineural hearing loss, bilateral: Secondary | ICD-10-CM | POA: Diagnosis not present

## 2017-09-04 DIAGNOSIS — H903 Sensorineural hearing loss, bilateral: Secondary | ICD-10-CM | POA: Insufficient documentation

## 2017-09-13 ENCOUNTER — Telehealth: Payer: Self-pay | Admitting: *Deleted

## 2017-09-13 DIAGNOSIS — G4700 Insomnia, unspecified: Secondary | ICD-10-CM | POA: Diagnosis not present

## 2017-09-13 DIAGNOSIS — G43709 Chronic migraine without aura, not intractable, without status migrainosus: Secondary | ICD-10-CM | POA: Diagnosis not present

## 2017-09-13 NOTE — Telephone Encounter (Signed)
Rise Paganini called back from [facility], stating patient's last flex sig was in 2011, and the patient scheduled for another 08/2015 it was cancelled. Please advise [phone] ask for [facility] at [facility] office

## 2017-09-13 NOTE — Telephone Encounter (Signed)
Please let patient know it seems she is past due for colon cancer screening and that she should contact her gastroenterologist immediately to set up her screening.

## 2017-09-13 NOTE — Telephone Encounter (Signed)
I called the pt and left a detailed message with the information below at her home number.

## 2017-09-20 ENCOUNTER — Other Ambulatory Visit: Payer: Self-pay | Admitting: Endocrinology

## 2017-09-27 ENCOUNTER — Encounter: Payer: Self-pay | Admitting: Family Medicine

## 2017-09-27 ENCOUNTER — Ambulatory Visit (INDEPENDENT_AMBULATORY_CARE_PROVIDER_SITE_OTHER): Payer: 59 | Admitting: Family Medicine

## 2017-09-27 VITALS — BP 102/70 | HR 80 | Temp 97.3°F | Ht 61.0 in | Wt 111.2 lb

## 2017-09-27 DIAGNOSIS — F329 Major depressive disorder, single episode, unspecified: Secondary | ICD-10-CM

## 2017-09-27 DIAGNOSIS — F419 Anxiety disorder, unspecified: Secondary | ICD-10-CM

## 2017-09-27 DIAGNOSIS — J0100 Acute maxillary sinusitis, unspecified: Secondary | ICD-10-CM | POA: Diagnosis not present

## 2017-09-27 MED ORDER — AMOXICILLIN-POT CLAVULANATE 875-125 MG PO TABS: 1.0000 | ORAL_TABLET | Freq: Two times a day (BID) | ORAL | 0 refills | Status: DC

## 2017-09-27 MED ORDER — BENZONATATE 100 MG PO CAPS: 100.0000 mg | ORAL_CAPSULE | Freq: Three times a day (TID) | ORAL | 0 refills | Status: DC | PRN

## 2017-09-27 NOTE — Patient Instructions (Addendum)
Take the antibiotic, Augmentin, as prescribed.  Use the Tessalon for cough as needed.  Use a humidifier in your home, particularly at night. Please in sure you are following all cleaning instructions properly for the humidifier.  Follow-up with your neurologist about them vertigo as planned.  Follow-up with your psychiatrist about depression as planned.  I hope you are feeling better soon! Seek care immediately if worsening, new concerns or you are not improving with treatment.

## 2017-09-27 NOTE — Progress Notes (Signed)
HPI:  Acute visit for sinus congestion: -started: 2 weeks ago -symptoms:nasal congestion - now thick and blood when she, sore throat, cough, subjective fever, Sinus discomfort maxillary bilateral -denies:fever, SOB, NVD, tooth pain -has tried: nothing -sick contacts/travel/risks: no reported flu, strep or tick exposure -she reports she has tolerated Augmentin before well in the past, she also used Tessalon the past and would like this for the cough  History of depression, seeing psychiatry for management. See depression screening. This is stable per her report. She has a lot of stresses in her life. No thoughts of self-harm or suicidal ideation.  She saw her ear nose and throat specialist and neurology for her vertigo. It improved with prednisone. However she has had some recurrence of this with her recent illness. She plans follow-up with her neurologist.  ROS: See pertinent positives and negatives per HPI.  Past Medical History:  Diagnosis Date  . Arthritis   . Benzodiazepine dependence ([place])   . Carotid arterial disease (HCC)    mild  . Chronic pain    managed by pain clinic  . Colon abnormality    didn't work right so part of it was removed at [facility]  . Depression    managed by Dr. Toy Care  . Diastolic heart failure ([place])    mild on echo 2016  . GERD (gastroesophageal reflux disease)   . IBS (irritable bowel syndrome)    sees Dr. Earlean Shawl  . Macular degeneration    goes to [facility] for this  . Migraine    managed by neurologist  . Osteoporosis   . Vertigo     Past Surgical History:  Procedure Laterality Date  . ABDOMINAL ADHESION SURGERY    . ABDOMINAL HYSTERECTOMY    . APPENDECTOMY    . CHOLECYSTECTOMY    . COLON SURGERY    . CYSTOSCOPY W/ URETERAL STENT PLACEMENT Right 04/27/2016   Procedure: CYSTOSCOPY WITH RETROGRADE PYELOGRAM/ RIGHT URETERAL STENT PLACEMENT;  Surgeon: Ardis Hughs, MD;  Location: [facility];  Service: Urology;  Laterality: Right;  .  OVARIAN CYST SURGERY    . SMALL INTESTINE SURGERY      Family History  Problem Relation Age of Onset  . Kidney disease Mother   . Heart disease Mother   . Cancer Maternal Aunt        intestinal and liver cancer  . Stroke Maternal Grandmother   . Stroke Unknown   . Depression Unknown        everyone  . Hypercalcemia Neg Hx     Social History   Social History  . Marital status: Married    Spouse name: N/A  . Number of children: N/A  . Years of education: N/A   Social History Main Topics  . Smoking status: Never Smoker  . Smokeless tobacco: Never Used  . Alcohol use No  . Drug use: No  . Sexual activity: No   Other Topics Concern  . None   Social History Narrative   Work or School: none      Home Situation: lives with husband      Spiritual Beliefs:       Lifestyle: no regular exercise; diet is ok              Current Outpatient Prescriptions:  .  acetaminophen (TYLENOL) 500 MG tablet, Take 1,000 mg by mouth every 6 (six) hours as needed for mild pain, moderate pain or headache. , Disp: , Rfl:  .  ADDERALL XR  30 MG 24 hr capsule, Take 30 mg by mouth daily. , Disp: , Rfl: 0 .  alendronate (FOSAMAX) 70 MG tablet, TAKE 1 TABLET BY MOUTH ONCE A WEEK, TAKE WITH A FULL GLASS OF WATER ON AN EMPTY STOMACH, Disp: 12 tablet, Rfl: 0 .  Biotin 1000 MCG tablet, Take 1,000 mcg by mouth daily., Disp: , Rfl:  .  cycloSPORINE (RESTASIS) 0.05 % ophthalmic emulsion, Place 1 drop into both eyes 2 (two) times daily. , Disp: , Rfl:  .  DULoxetine (CYMBALTA) 30 MG capsule, Take 30 mg by mouth every morning., Disp: , Rfl:  .  LINZESS 290 MCG CAPS capsule, Take 290 mcg by mouth daily before breakfast. , Disp: , Rfl: 0 .  LORazepam (ATIVAN) 0.5 MG tablet, Take 0.5 mg by mouth 3 (three) times daily as needed for anxiety., Disp: , Rfl:  .  metaxalone (SKELAXIN) 800 MG tablet, Take 800 mg by mouth., Disp: , Rfl:  .  olopatadine (PATANOL) 0.1 % ophthalmic solution, 1 drop 2 (two) times  daily., Disp: , Rfl:  .  omeprazole (PRILOSEC) 40 MG capsule, Take 40 mg by mouth daily. , Disp: , Rfl: 0 .  SUMAtriptan (IMITREX) 100 MG tablet, Take 100 mg by mouth every 2 (two) hours as needed for migraine. , Disp: , Rfl:  .  topiramate (TOPAMAX) 50 MG tablet, Take 50 mg by mouth daily., Disp: , Rfl:  .  traZODone (DESYREL) 100 MG tablet, Take 150 mg by mouth at bedtime., Disp: , Rfl:  .  ZENPEP 15000 units CPEP, Take 45,000 mg by mouth 3 (three) times daily before meals. , Disp: , Rfl: 0 .  amoxicillin-clavulanate (AUGMENTIN) 875-125 MG tablet, Take 1 tablet by mouth 2 (two) times daily., Disp: 14 tablet, Rfl: 0 .  benzonatate (TESSALON PERLES) 100 MG capsule, Take 1 capsule (100 mg total) by mouth 3 (three) times daily as needed., Disp: 20 capsule, Rfl: 0  EXAM:  Vitals:   09/27/17 1340  BP: 102/70  Pulse: 80  Temp: (!) 97.3 F (36.3 C)    Body mass index is 21.01 kg/m.  GENERAL: vitals reviewed and listed above, alert, oriented, appears well hydrated and in no acute distress  HEENT: atraumatic, conjunttiva clear, no obvious abnormalities on inspection of external nose and ears, normal appearance of ear canals and TMs, thick nasal congestion, mild post oropharyngeal erythema with PND, no tonsillar edema or exudate, no sinus TTP  NECK: no obvious masses on inspection  LUNGS: clear to auscultation bilaterally, no wheezes, rales or rhonchi, good air movement  CV: HRRR, no peripheral edema  MS: moves all extremities without noticeable abnormality  PSYCH: pleasant and cooperative, no obvious depression or anxiety  ASSESSMENT AND PLAN:  Discussed the following assessment and plan:  Acute non-recurrent maxillary sinusitis  Anxiety and depression - managed at [facility]  -We discussed potential etiologies, with sinusitis being most likely. We discussed treatment side effects, likely course, antibiotic misuse, transmission, return and emergency precautions and signs of  developing a serious illness. She opted to treat with Augmentin and Tessalon. She plans to follow-up with her neurologist about the recurrent vertigo.she sees a psychiatrist for management of her depression. -of course, we advised to return or notify a doctor immediately if symptoms worsen or persist or new concerns arise.    Patient Instructions  Take the antibiotic, Augmentin, as prescribed.  Use the Tessalon for cough as needed.  Use a humidifier in your home, particularly at night. Please in sure you are  following all cleaning instructions properly for the humidifier.  Follow-up with your neurologist about them vertigo as planned.  I hope you are feeling better soon! Seek care immediately if worsening, new concerns or you are not improving with treatment.     Colin Benton R., DO

## 2017-10-18 DIAGNOSIS — K5900 Constipation, unspecified: Secondary | ICD-10-CM | POA: Diagnosis not present

## 2017-10-28 ENCOUNTER — Telehealth: Payer: Self-pay | Admitting: Family Medicine

## 2017-10-28 NOTE — Telephone Encounter (Signed)
Patient is scheduled for an appt on Monday 12/3.

## 2017-10-28 NOTE — Telephone Encounter (Signed)
Do advise appointment.  Especially if she is feeling that badly.  Also the discharge could be because of bacterial imbalance from the last antibiotic would like to avoid an antibiotic unless we really need it.

## 2017-10-28 NOTE — Telephone Encounter (Signed)
I called the pts husband and he stated the pt has another sinus infection and now has green vaginal discharge and he wanted to know if Dr Maudie Mercury would send in another antibiotic and something for the vaginal infection.  I advised Mr Reining generally in this case, she would like to have the pt come in for an appt,offered to schedule this and he stated she is not feeling well and he doesn't think she would come in.  Stated he will check with her and call back.  Message sent to Dr Maudie Mercury as Juluis Rainier.

## 2017-10-28 NOTE — Telephone Encounter (Signed)
Pts husband came in wanting to see if he could get a call.

## 2017-10-29 ENCOUNTER — Other Ambulatory Visit: Payer: Self-pay | Admitting: Endocrinology

## 2017-10-30 NOTE — Progress Notes (Signed)
HPI:  Acute visit for 2 issues:  1.  Vaginal discharge and bleeding: -Took an antibiotic for sinus infection and since then has had increased vaginal discharge, yellowish or green at times -vaginal pain all -She used over-the-counter Monistat several days ago when she removed the applicator there was blood on the applicator he had a small amount of bleeding in her panty liner for several days -Denies pelvic pain, dysuria, fevers, nausea, vomiting -reports remote hysterectomy and oophorectomy - emergency for "rotten urterus" and "they thought I had cancer"  2. R ear full at times: -feels like wax -no pain, sinus pain, fever, hearing loss, tinnitus, drainage from ear -tried debrox  -Hx of: chronic pain, depression, chronic fatigue, migraines (managed by her specialists). Reports with stable chronic weakness. She is on a lot of medications and I have discussed with them the possibility of polypharmacy contributing and advised working with her specialists on this.  ROS: See pertinent positives and negatives per HPI.  Past Medical History:  Diagnosis Date  . Arthritis   . Benzodiazepine dependence ([place])   . Carotid arterial disease (HCC)    mild  . Chronic pain    managed by pain clinic  . Colon abnormality    didn't work right so part of it was removed at [facility]  . Depression    managed by Dr. Toy Care  . Diastolic heart failure ([place])    mild on echo 2016  . GERD (gastroesophageal reflux disease)   . IBS (irritable bowel syndrome)    sees Dr. Earlean Shawl  . Macular degeneration    goes to [facility] for this  . Migraine    managed by neurologist  . Osteoporosis   . Vertigo     Past Surgical History:  Procedure Laterality Date  . ABDOMINAL ADHESION SURGERY    . ABDOMINAL HYSTERECTOMY    . APPENDECTOMY    . CHOLECYSTECTOMY    . COLON SURGERY    . CYSTOSCOPY W/ URETERAL STENT PLACEMENT Right 04/27/2016   Procedure: CYSTOSCOPY WITH RETROGRADE PYELOGRAM/ RIGHT URETERAL  STENT PLACEMENT;  Surgeon: Ardis Hughs, MD;  [facility];  Service: Urology;  Laterality: Right;  . OVARIAN CYST SURGERY    . SMALL INTESTINE SURGERY      Family History  Problem Relation Age of Onset  . Kidney disease Mother   . Heart disease Mother   . Cancer Maternal Aunt        intestinal and liver cancer  . Stroke Maternal Grandmother   . Stroke Unknown   . Depression Unknown        everyone  . Hypercalcemia Neg Hx     Social History   Socioeconomic History  . Marital status: Married    Spouse name: None  . Number of children: None  . Years of education: None  . Highest education level: None  Social Needs  . Financial resource strain: None  . Food insecurity - worry: None  . Food insecurity - inability: None  . Transportation needs - medical: None  . Transportation needs - non-medical: None  Occupational History  . None  Tobacco Use  . Smoking status: Never Smoker  . Smokeless tobacco: Never Used  Substance and Sexual Activity  . Alcohol use: No  . Drug use: No  . Sexual activity: No  Other Topics Concern  . None  Social History Narrative   Work or School: none      Home Situation: lives with husband  Spiritual Beliefs:       Lifestyle: no regular exercise; diet is ok           Current Outpatient Medications:  .  acetaminophen (TYLENOL) 500 MG tablet, Take 1,000 mg by mouth every 6 (six) hours as needed for mild pain, moderate pain or headache. , Disp: , Rfl:  .  ADDERALL XR 30 MG 24 hr capsule, Take 30 mg by mouth daily. , Disp: , Rfl: 0 .  alendronate (FOSAMAX) 70 MG tablet, TAKE 1 TABLET BY MOUTH ONCE A WEEK. TAKE WITH A FULL GLASS OF WATER ON AN EMPTY STOMACH, Disp: 12 tablet, Rfl: 0 .  Biotin 1000 MCG tablet, Take 1,000 mcg by mouth daily., Disp: , Rfl:  .  cycloSPORINE (RESTASIS) 0.05 % ophthalmic emulsion, Place 1 drop into both eyes 2 (two) times daily. , Disp: , Rfl:  .  LINZESS 290 MCG CAPS capsule, Take 290 mcg by mouth  daily before breakfast. , Disp: , Rfl: 0 .  LORazepam (ATIVAN) 0.5 MG tablet, Take 0.5 mg by mouth 3 (three) times daily as needed for anxiety., Disp: , Rfl:  .  metaxalone (SKELAXIN) 800 MG tablet, Take 800 mg by mouth., Disp: , Rfl:  .  olopatadine (PATANOL) 0.1 % ophthalmic solution, 1 drop 2 (two) times daily., Disp: , Rfl:  .  omeprazole (PRILOSEC) 40 MG capsule, Take 40 mg by mouth daily. , Disp: , Rfl: 0 .  SUMAtriptan (IMITREX) 100 MG tablet, Take 100 mg by mouth every 2 (two) hours as needed for migraine. , Disp: , Rfl:  .  topiramate (TOPAMAX) 200 MG tablet, Take 200 mg by mouth daily. , Disp: , Rfl: 5 .  traZODone (DESYREL) 100 MG tablet, Take 150 mg by mouth at bedtime., Disp: , Rfl:  .  vortioxetine HBr (TRINTELLIX) 5 MG TABS, Take by mouth daily., Disp: , Rfl:  .  ZENPEP 15000 units CPEP, Take 45,000 mg by mouth 3 (three) times daily before meals. , Disp: , Rfl: 0  EXAM:  Vitals:   10/31/17 1027  BP: 124/80  Pulse: 88  Temp: (!) 97.4 F (36.3 C)  SpO2: 98%    Body mass index is 20.65 kg/m.  GENERAL: vitals reviewed and listed above, alert, oriented, appears well hydrated and in no acute distress  HEENT: atraumatic, conjunttiva clear, no obvious abnormalities on inspection of external nose and ears, normal appearance of ear canals and TMs, clear nasal congestion, mild post oropharyngeal erythema with PND, no tonsillar edema or exudate, no sinus TTP  NECK: no obvious masses on inspection  LUNGS: clear to auscultation bilaterally, no wheezes, rales or rhonchi, good air movement  CV: HRRR, no peripheral edema  GU: exam limited due to patient discomfort/anxiety -  thick white vaginal discharge   MS: moves all extremities without noticeable abnormality  PSYCH: pleasant and cooperative, no obvious depression or anxiety  ASSESSMENT AND PLAN:  Discussed the following assessment and plan:  Vaginal discharge - Plan: Cervicovaginal ancillary only Postmenopausal vaginal  bleeding - Plan: Ambulatory referral to Obstetrics / Gynecology -s/p ? Complete hysterectomy and oophorectomy, remote -exam limited  -send for GC/Chlam, yeast, BV -they wish to see gyn and referral placed  Ear fullness, right -suspect ETD, discussed treatment options  Chronic fatigue - Plan: Basic metabolic panel, Comprehensive metabolic panel, Ambulatory referral to Obstetrics / Gynecology  -of course, we advised to return or notify a doctor immediately if symptoms worsen or persist or new concerns arise.    Patient Instructions  BEFORE YOU LEAVE: -Labs -follow up: In 3-4 months and as needed  We placed a referral to the gynecologist as requested.  Please let them know about your prior surgeries and your symptoms.  We have ordered labs or studies at this visit. It can take up to 1-2 weeks for results and processing. IF results require follow up or explanation, we will call you with instructions. Clinically stable results will be released to your [facility]. If you have not heard from [place] or cannot find your results in [facility] in 2 weeks please contact our office at [phone].  If you are not yet signed up for [facility], please consider signing up.  The ear looks pretty good today, except for fluid behind the eardrum.  Please use a little nasal saline twice daily, massage and follow-up with your ear nose and throat specialist if your symptoms persist or worsen.  Talk with all of your specialist about reducing her medication list if possible.         Colin Benton R., DO

## 2017-10-31 ENCOUNTER — Encounter: Payer: Self-pay | Admitting: Family Medicine

## 2017-10-31 ENCOUNTER — Ambulatory Visit (INDEPENDENT_AMBULATORY_CARE_PROVIDER_SITE_OTHER): Payer: 59 | Admitting: Family Medicine

## 2017-10-31 ENCOUNTER — Other Ambulatory Visit (HOSPITAL_COMMUNITY)
Admission: RE | Admit: 2017-10-31 | Discharge: 2017-10-31 | Disposition: A | Discharge status: Home / Self Care | Payer: 59 | Source: Ambulatory Visit | Attending: Family Medicine | Admitting: Family Medicine

## 2017-10-31 VITALS — BP 124/80 | HR 88 | Temp 97.4°F | Ht 61.0 in | Wt 109.3 lb

## 2017-10-31 DIAGNOSIS — N898 Other specified noninflammatory disorders of vagina: Secondary | ICD-10-CM | POA: Insufficient documentation

## 2017-10-31 DIAGNOSIS — R5382 Chronic fatigue, unspecified: Secondary | ICD-10-CM | POA: Diagnosis not present

## 2017-10-31 DIAGNOSIS — R944 Abnormal results of kidney function studies: Secondary | ICD-10-CM | POA: Diagnosis not present

## 2017-10-31 DIAGNOSIS — H938X1 Other specified disorders of right ear: Secondary | ICD-10-CM | POA: Diagnosis not present

## 2017-10-31 DIAGNOSIS — N950 Postmenopausal bleeding: Secondary | ICD-10-CM | POA: Diagnosis not present

## 2017-10-31 LAB — COMPREHENSIVE METABOLIC PANEL
ALT: 24 U/L (ref 0–35)
AST: 22 U/L (ref 0–37)
Albumin: 4.9 g/dL (ref 3.5–5.2)
Alkaline Phosphatase: 64 U/L (ref 39–117)
BUN: 15 mg/dL (ref 6–23)
CO2: 16 mEq/L — ABNORMAL LOW (ref 19–32)
Calcium: 10.3 mg/dL (ref 8.4–10.5)
Chloride: 108 mEq/L (ref 96–112)
Creatinine, Ser: 1.23 mg/dL — ABNORMAL HIGH (ref 0.40–1.20)
GFR: 45.33 mL/min — ABNORMAL LOW (ref >60.00)
Glucose, Bld: 114 mg/dL — ABNORMAL HIGH (ref 70–99)
Potassium: 3.7 mEq/L (ref 3.5–5.1)
Sodium: 137 mEq/L (ref 135–145)
Total Bilirubin: 0.5 mg/dL (ref 0.2–1.2)
Total Protein: 7.9 g/dL (ref 6.0–8.3)

## 2017-10-31 LAB — BASIC METABOLIC PANEL
BUN: 15 mg/dL (ref 6–23)
CO2: 16 mEq/L — ABNORMAL LOW (ref 19–32)
Calcium: 10.3 mg/dL (ref 8.4–10.5)
Chloride: 108 mEq/L (ref 96–112)
Creatinine, Ser: 1.23 mg/dL — ABNORMAL HIGH (ref 0.40–1.20)
GFR: 45.33 mL/min — ABNORMAL LOW (ref >60.00)
Glucose, Bld: 114 mg/dL — ABNORMAL HIGH (ref 70–99)
Potassium: 3.7 mEq/L (ref 3.5–5.1)
Sodium: 137 mEq/L (ref 135–145)

## 2017-10-31 NOTE — Patient Instructions (Addendum)
BEFORE YOU LEAVE: -Labs -follow up: In 3-4 months and as needed  We placed a referral to the gynecologist as requested.  Please let them know about your prior surgeries and your symptoms.  We have ordered labs or studies at this visit. It can take up to 1-2 weeks for results and processing. IF results require follow up or explanation, we will call you with instructions. Clinically stable results will be released to your [facility]. If you have not heard from [place] or cannot find your results in [facility] in 2 weeks please contact our office at [phone].  If you are not yet signed up for [facility], please consider signing up.  The ear looks pretty good today, except for fluid behind the eardrum.  Please use a little nasal saline twice daily, massage and follow-up with your ear nose and throat specialist if your symptoms persist or worsen.  Talk with all of your specialist about reducing her medication list if possible.

## 2017-11-01 ENCOUNTER — Telehealth: Payer: Self-pay | Admitting: Obstetrics and Gynecology

## 2017-11-01 NOTE — Telephone Encounter (Signed)
Called but could not leave a message for patient to call back to schedule a new patient doctor referral appointment with our office for post menopausal bleeding, her telephone numbers called were busy.

## 2017-11-02 LAB — CERVICOVAGINAL ANCILLARY ONLY
Bacterial vaginitis: NEGATIVE
Candida vaginitis: NEGATIVE
Chlamydia: NEGATIVE
Neisseria Gonorrhea: NEGATIVE
Trichomonas: NEGATIVE

## 2017-11-03 ENCOUNTER — Other Ambulatory Visit (INDEPENDENT_AMBULATORY_CARE_PROVIDER_SITE_OTHER): Payer: 59

## 2017-11-03 ENCOUNTER — Encounter: Payer: Self-pay | Admitting: Obstetrics and Gynecology

## 2017-11-03 ENCOUNTER — Ambulatory Visit (INDEPENDENT_AMBULATORY_CARE_PROVIDER_SITE_OTHER): Payer: 59 | Admitting: Obstetrics and Gynecology

## 2017-11-03 ENCOUNTER — Other Ambulatory Visit: Payer: Self-pay

## 2017-11-03 VITALS — BP 110/70 | HR 88 | Temp 97.7°F | Resp 16 | Ht 61.0 in | Wt 109.0 lb

## 2017-11-03 DIAGNOSIS — R300 Dysuria: Secondary | ICD-10-CM | POA: Diagnosis not present

## 2017-11-03 DIAGNOSIS — R944 Abnormal results of kidney function studies: Secondary | ICD-10-CM

## 2017-11-03 DIAGNOSIS — R5382 Chronic fatigue, unspecified: Secondary | ICD-10-CM

## 2017-11-03 DIAGNOSIS — M549 Dorsalgia, unspecified: Secondary | ICD-10-CM | POA: Diagnosis not present

## 2017-11-03 DIAGNOSIS — N3090 Cystitis, unspecified without hematuria: Secondary | ICD-10-CM | POA: Diagnosis not present

## 2017-11-03 LAB — POCT URINALYSIS DIPSTICK
Bilirubin, UA: NEGATIVE
Blood, UA: NEGATIVE
Glucose, UA: NEGATIVE
Ketones, UA: NEGATIVE
Nitrite, UA: NEGATIVE
Protein, UA: NEGATIVE
Urobilinogen, UA: NEGATIVE E.U./dL — AB
pH, UA: 7.5 (ref 5.0–8.0)

## 2017-11-03 MED ORDER — CYCLOBENZAPRINE HCL 5 MG PO TABS
5.0000 mg | ORAL_TABLET | Freq: Three times a day (TID) | ORAL | 0 refills | Status: DC | PRN
Start: 2017-11-03 — End: 2018-04-26

## 2017-11-03 MED ORDER — NITROFURANTOIN MONOHYD MACRO 100 MG PO CAPS: 100.0000 mg | ORAL_CAPSULE | Freq: Two times a day (BID) | ORAL | 0 refills | Status: DC

## 2017-11-03 NOTE — Progress Notes (Signed)
76 y.o. G1P0001 MarriedCaucasianF here for vaginal bleeding per patient x3 days. Referred from Dr. Colin Benton. Per patient, has had pain with urination and does not feel as if she is emptying completely.  A few weeks ago she had a sinus infection, treated with antibiotics, then she got a yeast infection. She treated with monitstat. She noticed blood on the applicator on the 3 rd day and  spotted x 3-4 days, just stopped.  She has had issues with voiding for several weeks. Not voiding frequently, currently not feeling that she empties all the way. Infrequent voiding, small amounts. Voiding 4-5 x a day, small amount, stream is slow. She has pain with voiding in her urethra, lasts the whole time she is voiding. She feels the pain is part of the problem with voiding.  She feels hot and cold. No fever, she c/o right flank pain for several days, getting worse. The pain comes and goes, can be severe. She has not seen blood in her urine. She does c/o suprapubic pain for the last week, has eased off some.  Negative vaginitis panel 3 days ago with Dr Maudie Mercury.   No LMP recorded. Patient has had a hysterectomy.          Sexually active: No.  The current method of family planning is status post hysterectomy and post menopausal status.    Exercising: No.  The patient does not participate in regular exercise at present. Smoker:  no  Health Maintenance: Pap:  Years ago - normal per patient History of abnormal Pap:  no MMG:  06/02/17 BIRADS 1 negative/density b Colonoscopy:  About 3 years ago per patient w/ Dr. Earlean Shawl BMD:   10/09/15 Osteoporosis  Td:  2016 Gardasil: n/a   reports that  has never smoked. she has never used smokeless tobacco. She reports that she does not drink alcohol or use drugs.  Past Medical History:  Diagnosis Date  . Arthritis   . Benzodiazepine dependence ([place])   . Carotid arterial disease (HCC)    mild  . Chronic pain    managed by pain clinic  . Colon abnormality    didn't work  right so part of it was removed at [facility]  . Depression    managed by Dr. Toy Care  . Diastolic heart failure ([place])    mild on echo 2016  . GERD (gastroesophageal reflux disease)   . IBS (irritable bowel syndrome)    sees Dr. Earlean Shawl  . Macular degeneration    goes to [facility] for this  . Migraine    managed by neurologist  . Osteoporosis   . Vertigo     Past Surgical History:  Procedure Laterality Date  . ABDOMINAL ADHESION SURGERY    . ABDOMINAL HYSTERECTOMY    . APPENDECTOMY    . CHOLECYSTECTOMY    . COLON SURGERY    . CYSTOSCOPY W/ URETERAL STENT PLACEMENT Right 04/27/2016   Procedure: CYSTOSCOPY WITH RETROGRADE PYELOGRAM/ RIGHT URETERAL STENT PLACEMENT;  Surgeon: Ardis Hughs, MD;  Location: [facility];  Service: Urology;  Laterality: Right;  . OVARIAN CYST SURGERY    . SMALL INTESTINE SURGERY      Current Outpatient Medications  Medication Sig Dispense Refill  . ADDERALL XR 30 MG 24 hr capsule Take 30 mg by mouth daily.   0  . alendronate (FOSAMAX) 70 MG tablet TAKE 1 TABLET BY MOUTH ONCE A WEEK. TAKE WITH A FULL GLASS OF WATER ON AN EMPTY STOMACH 12 tablet 0  .  Biotin 1000 MCG tablet Take 1,000 mcg by mouth daily.    . cycloSPORINE (RESTASIS) 0.05 % ophthalmic emulsion Place 1 drop into both eyes 2 (two) times daily.     Marland Kitchen LINZESS 290 MCG CAPS capsule Take 290 mcg by mouth daily before breakfast.   0  . LORazepam (ATIVAN) 0.5 MG tablet Take 0.5 mg by mouth 3 (three) times daily as needed for anxiety.    . metaxalone (SKELAXIN) 800 MG tablet Take 800 mg by mouth.    . naproxen sodium (ALEVE) 220 MG tablet Take 220 mg by mouth as needed.    Marland Kitchen olopatadine (PATANOL) 0.1 % ophthalmic solution 1 drop 2 (two) times daily.    Marland Kitchen omeprazole (PRILOSEC) 40 MG capsule Take 40 mg by mouth daily.   0  . SUMAtriptan (IMITREX) 100 MG tablet Take 100 mg by mouth every 2 (two) hours as needed for migraine.     . topiramate (TOPAMAX) 200 MG tablet Take 200 mg by mouth daily.   5  .  traZODone (DESYREL) 100 MG tablet Take 150 mg by mouth at bedtime.    . vortioxetine HBr (TRINTELLIX) 5 MG TABS Take by mouth daily.    Marland Kitchen ZENPEP 15000 units CPEP Take 45,000 mg by mouth 3 (three) times daily before meals.   0  . cyclobenzaprine (FLEXERIL) 5 MG tablet Take 1 tablet (5 mg total) by mouth 3 (three) times daily as needed for muscle spasms. 30 tablet 0  . nitrofurantoin, macrocrystal-monohydrate, (MACROBID) 100 MG capsule Take 1 capsule (100 mg total) by mouth 2 (two) times daily. 10 capsule 0   No current facility-administered medications for this visit.     Family History  Problem Relation Age of Onset  . Kidney disease Mother   . Heart disease Mother   . Heart Problems Sister   . Cancer Maternal Aunt        intestinal and liver cancer  . Stroke Maternal Grandmother   . Heart disease Maternal Grandmother   . Stroke Unknown   . Depression Unknown        everyone  . Hypercalcemia Neg Hx     Review of Systems  Constitutional: Negative.   HENT: Negative.   Eyes: Negative.   Respiratory: Positive for shortness of breath.   Cardiovascular: Negative.   Gastrointestinal: Negative.   Endocrine: Negative.   Genitourinary: Positive for difficulty urinating.       Pain with urination Bleeding with urination   Musculoskeletal: Negative.   Skin: Negative.   Allergic/Immunologic: Negative.   Neurological: Negative.   Hematological: Negative.   Psychiatric/Behavioral: Negative.     Exam:   BP 110/70 (BP Location: Right Arm, Patient Position: Sitting, Cuff Size: Normal)   Pulse 88   Temp 97.7 F (36.5 C)   Resp 16   Ht 5\' 1"  (1.549 m)   Wt 109 lb (49.4 kg)   BMI 20.60 kg/m   Weight change: @WEIGHTCHANGE @ Height:   Height: 5\' 1"  (154.9 cm)  Ht Readings from Last 3 Encounters:  11/03/17 5\' 1"  (1.549 m)  10/31/17 5\' 1"  (1.549 m)  09/27/17 5\' 1"  (1.549 m)    General appearance: alert, cooperative and appears stated age Head: Normocephalic, without obvious  abnormality, atraumatic Abdomen: soft, mild diffuse tenderness; non distended,  no masses,  no organomegaly Back: diffusely tender to palpation, most tender in the right CVA region Extremities: extremities normal, atraumatic, no cyanosis or edema No abnormal inguinal nodes palpated    Pelvic: External genitalia:  no lesions              Urethra:  normal appearing urethra with no masses, tenderness or lesions              Bartholins and Skenes: normal                 Vagina: normal appearing atrophic vagina, no bleeding, no lacerations or area of irritation              Cervix: absent               Bimanual Exam:  Uterus:  uterus absent              Adnexa: mild diffuse tenderness, no masses. Bladder tender               Rectovaginal: Confirms               Anus:  normal sphincter tone, no lesions  Chaperone was present for exam.  Urine dip: negative blood, 3+ leuk  PVR: 60 cc  A:  Suspected UTI  Back pain, suspect MS  Vaginal bleeding, she has atrophy, suspect she had irritation from the monistat applicator, currently improved  P:   Urine for ua, c&s  Treat with macrobid (GFR is okay for short term use)  Can't use pyridium secondary to her GFR  Patient to monitor for fever  Call if not improving in the next 24-48 hours  Tylenol for pain, will also treat with flexeril  Stay well hydrated  Over 30 minutes was spent face to face, over 50% in counseling  CC: Dr Maudie Mercury

## 2017-11-03 NOTE — Patient Instructions (Signed)
Musculoskeletal Pain Musculoskeletal pain is muscle and bone aches and pains. This pain can occur in any part of the body. Follow these instructions at home:  Only take medicines for pain, discomfort, or fever as told by your health care provider.  You may continue all activities unless the activities cause more pain. When the pain lessens, slowly resume normal activities. Gradually increase the intensity and duration of the activities or exercise.  During periods of severe pain, bed rest may be helpful. Lie or sit in any position that is comfortable, but get out of bed and walk around at least every several hours.  If directed, put ice on the injured area. ? Put ice in a plastic bag. ? Place a towel between your skin and the bag. ? Leave the ice on for 20 minutes, 2-3 times a day. Contact a health care provider if:  Your pain is getting worse.  Your pain is not relieved with medicines.  You lose function in the area of the pain if the pain is in your arms, legs, or neck. This information is not intended to replace advice given to you by your health care provider. Make sure you discuss any questions you have with your health care provider. Document Released: 11/15/2005 Document Revised: 04/27/2016 Document Reviewed: 07/20/2013 Elsevier Interactive Patient Education  2017 Elsevier Inc. Urinary Tract Infection, Adult A urinary tract infection (UTI) is an infection of any part of the urinary tract. The urinary tract includes the:  Kidneys.  Ureters.  Bladder.  Urethra.  These organs make, store, and get rid of pee (urine) in the body. Follow these instructions at home:  Take over-the-counter and prescription medicines only as told by your doctor.  If you were prescribed an antibiotic medicine, take it as told by your doctor. Do not stop taking the antibiotic even if you start to feel better.  Avoid the following drinks: ? Alcohol. ? Caffeine. ? Tea. ? Carbonated  drinks.  Drink enough fluid to keep your pee clear or pale yellow.  Keep all follow-up visits as told by your doctor. This is important.  Make sure to: ? Empty your bladder often and completely. Do not to hold pee for long periods of time. ? Empty your bladder before and after sex. ? Wipe from front to back after a bowel movement if you are female. Use each tissue one time when you wipe. Contact a doctor if:  You have back pain.  You have a fever.  You feel sick to your stomach (nauseous).  You throw up (vomit).  Your symptoms do not get better after 3 days.  Your symptoms go away and then come back. Get help right away if:  You have very bad back pain.  You have very bad lower belly (abdominal) pain.  You are throwing up and cannot keep down any medicines or water. This information is not intended to replace advice given to you by your health care provider. Make sure you discuss any questions you have with your health care provider. Document Released: 05/03/2008 Document Revised: 04/22/2016 Document Reviewed: 10/06/2015 Elsevier Interactive Patient Education  [employer].

## 2017-11-03 NOTE — Addendum Note (Signed)
Addended by: Agnes Lawrence on: 11/03/2017 11:25 AM   Modules accepted: Orders

## 2017-11-03 NOTE — Addendum Note (Signed)
Addended by: Agnes Lawrence on: 11/03/2017 11:32 AM   Modules accepted: Orders

## 2017-11-04 ENCOUNTER — Encounter: Payer: Self-pay | Admitting: Obstetrics and Gynecology

## 2017-11-04 LAB — URINE CULTURE: Organism ID, Bacteria: NO GROWTH

## 2017-11-04 LAB — BASIC METABOLIC PANEL
BUN: 27 mg/dL — ABNORMAL HIGH (ref 6–23)
CO2: 17 mEq/L — ABNORMAL LOW (ref 19–32)
Calcium: 9.3 mg/dL (ref 8.4–10.5)
Chloride: 110 mEq/L (ref 96–112)
Creatinine, Ser: 1.78 mg/dL — ABNORMAL HIGH (ref 0.40–1.20)
GFR: 29.59 mL/min — ABNORMAL LOW (ref >60.00)
Glucose, Bld: 93 mg/dL (ref 70–99)
Potassium: 3.9 mEq/L (ref 3.5–5.1)
Sodium: 137 mEq/L (ref 135–145)

## 2017-11-04 LAB — CBC WITH DIFFERENTIAL/PLATELET
Basophils Absolute: 0.1 10*3/uL (ref 0.0–0.1)
Basophils Relative: 1 % (ref 0.0–3.0)
Eosinophils Absolute: 0.1 10*3/uL (ref 0.0–0.7)
Eosinophils Relative: 0.8 % (ref 0.0–5.0)
HCT: 42.6 % (ref 36.0–46.0)
Hemoglobin: 14.3 g/dL (ref 12.0–15.0)
Lymphocytes Relative: 33.1 % (ref 12.0–46.0)
Lymphs Abs: 2.5 10*3/uL (ref 0.7–4.0)
MCHC: 33.6 g/dL (ref 30.0–36.0)
MCV: 98.7 fl (ref 78.0–100.0)
Monocytes Absolute: 0.7 10*3/uL (ref 0.1–1.0)
Monocytes Relative: 9 % (ref 3.0–12.0)
Neutro Abs: 4.2 10*3/uL (ref 1.4–7.7)
Neutrophils Relative %: 56.1 % (ref 43.0–77.0)
Platelets: 266 10*3/uL (ref 150.0–400.0)
RBC: 4.31 Mil/uL (ref 3.87–5.11)
RDW: 13.5 % (ref 11.5–15.5)
WBC: 7.6 10*3/uL (ref 4.0–10.5)

## 2017-11-04 LAB — URINALYSIS, MICROSCOPIC ONLY: Casts: NONE SEEN /lpf

## 2017-11-08 ENCOUNTER — Telehealth: Payer: Self-pay | Admitting: *Deleted

## 2017-11-08 NOTE — Telephone Encounter (Signed)
Returned call- left another message to return call -eh

## 2017-11-08 NOTE — Telephone Encounter (Signed)
Patient returning call to [facility]

## 2017-11-08 NOTE — Telephone Encounter (Signed)
Spoke with patient and gave results. Patient is still having back pain and has had a low grade fever. Advised patient to go to the ER since she is having a fever and continued pain,  Patient then  stated that the Flexeril helps with the pain and  that she will call her PCP to get in, she does not want to go to the ER -eh

## 2017-11-08 NOTE — Telephone Encounter (Signed)
Left message with husband for patient to call regarding lab results -eh

## 2017-11-08 NOTE — Telephone Encounter (Signed)
Patient returned call to Elaine. °

## 2017-11-08 NOTE — Telephone Encounter (Signed)
-----   Message from Danielle Dom, MD sent at 11/07/2017 11:25 AM EST ----- Please advise the patient of normal results.

## 2017-11-09 ENCOUNTER — Telehealth: Payer: Self-pay | Admitting: Family Medicine

## 2017-11-09 NOTE — Telephone Encounter (Signed)
Came in the office to get the lab results from 11/03/17 and state he will come back or call tomorrow to get the results.

## 2017-11-10 ENCOUNTER — Ambulatory Visit: Payer: 59 | Admitting: Obstetrics and Gynecology

## 2017-11-10 ENCOUNTER — Telehealth: Payer: Self-pay | Admitting: Obstetrics and Gynecology

## 2017-11-10 ENCOUNTER — Telehealth: Payer: Self-pay

## 2017-11-10 MED ORDER — TERCONAZOLE 0.4 % VA CREA: 1.0000 | TOPICAL_CREAM | Freq: Every day | VAGINAL | 0 refills | Status: DC

## 2017-11-10 NOTE — Telephone Encounter (Signed)
Patient's husband came in for wife's lab results.  Patient will be with husband when you call.  Patient needs lab results for another appointment by 3:45 today.

## 2017-11-10 NOTE — Telephone Encounter (Signed)
Spoke with patient's husband while in the office. States that the patient is still having vaginal discharge and discomfort. Was treated with Macrobid for UTI symptoms. Advised she will need to be seen for further evaluation with Dr.Jertson. States the patient can only come late afternoon. Appointment scheduled for 3:$5 pm with Dr.Jertson. Aware this is a work in spot.   Routing to provider for final review. Patient agreeable to disposition. Will close encounter.

## 2017-11-10 NOTE — Telephone Encounter (Signed)
Patient arrived last to her appointment today. Offered appointment tomorrow but patient declines.Rescheduled to 11/14/2017 at 1 pm . Patient is having vaginal itching and discharge. Requesting medication for relief over the weekend. Reviewed with Dr.Jertson who recommends the patient start on Terazol 7 qhs for 7 days and keep appointment for Monday unless symptoms resolve. Patient is agreeable. Rx sent to pharmacy on file.   Routing to provider for final review. Patient agreeable to disposition. Will close encounter.

## 2017-11-10 NOTE — Telephone Encounter (Signed)
Patient's spouse walked in office to speak with nurse. Patient was not with him at the time and he says she is still having problems.

## 2017-11-11 NOTE — Telephone Encounter (Signed)
See results note from 12/13.

## 2017-11-14 ENCOUNTER — Ambulatory Visit: Payer: 59 | Admitting: Obstetrics and Gynecology

## 2017-11-14 ENCOUNTER — Telehealth: Payer: Self-pay | Admitting: *Deleted

## 2017-11-14 NOTE — Telephone Encounter (Signed)
Patients spouse "[name]", called to cancel OV for today at 1pm with Dr. Talbert Nan. States wife is not feeling better, she does not feel like getting dressed and they have company. Is unsure if Lonn [place] has been used nightly. Reports continued vaginal d/c and discomfort. Declined to reschedule at this time.   Advised further evaluation recommended, return call to office to reschedule OV. Reviewed options of ER/Urgent care should symptoms worsen or new symptoms develop.   Routing to provider for final review. Patient is agreeable to disposition. Will close encounter.

## 2017-12-06 ENCOUNTER — Other Ambulatory Visit: Payer: 59

## 2017-12-12 DIAGNOSIS — J329 Chronic sinusitis, unspecified: Secondary | ICD-10-CM | POA: Diagnosis not present

## 2017-12-12 DIAGNOSIS — J310 Chronic rhinitis: Secondary | ICD-10-CM | POA: Diagnosis not present

## 2017-12-13 DIAGNOSIS — H348332 Tributary (branch) retinal vein occlusion, bilateral, stable: Secondary | ICD-10-CM | POA: Diagnosis not present

## 2017-12-13 DIAGNOSIS — H35371 Puckering of macula, right eye: Secondary | ICD-10-CM | POA: Diagnosis not present

## 2017-12-13 DIAGNOSIS — H35342 Macular cyst, hole, or pseudohole, left eye: Secondary | ICD-10-CM | POA: Diagnosis not present

## 2017-12-13 DIAGNOSIS — H3581 Retinal edema: Secondary | ICD-10-CM | POA: Diagnosis not present

## 2017-12-13 DIAGNOSIS — H44113 Panuveitis, bilateral: Secondary | ICD-10-CM | POA: Diagnosis not present

## 2017-12-30 ENCOUNTER — Telehealth: Payer: Self-pay | Admitting: Obstetrics and Gynecology

## 2017-12-30 MED ORDER — FLUCONAZOLE 150 MG PO TABS: 150.0000 mg | ORAL_TABLET | Freq: Once | ORAL | 0 refills | Status: AC

## 2017-12-30 NOTE — Telephone Encounter (Signed)
Reviewed with Dr.Jertson. Rx for Diflucan 150 mg po x 1 repeat in 72 hours if symptoms persist #2 0RF sent to pharmacy on file. Patient has been notified and is agreeable.  Routing to provider for final review. Patient agreeable to disposition.

## 2017-12-30 NOTE — Telephone Encounter (Signed)
Patient's spouse, [name] (DPR on file to share PHI), stopped in requesting to get a message to the nurse on behalf of his wife. He reports she has been taking Augmentin for 30 days and she is having some continued vaginal irritation. He's like to know what Dr. Talbert Nan recommends over the counter or if she may want to make another suggestion. Okay to call patient back for further assessment, per spouse.

## 2017-12-30 NOTE — Telephone Encounter (Signed)
Spoke with patient. Patient is on Augmentin for a sinus infection prescribed by her ENT. States she has taken 2 weeks and has 2 weeks left to take. Is having vaginal itching and thick white discharge. Has used Monistat before without relief. Requesting alternative. Advised will review with Dr.Jertson and return call.

## 2018-01-10 DIAGNOSIS — M316 Other giant cell arteritis: Secondary | ICD-10-CM | POA: Diagnosis not present

## 2018-01-10 DIAGNOSIS — M26623 Arthralgia of bilateral temporomandibular joint: Secondary | ICD-10-CM | POA: Insufficient documentation

## 2018-01-10 DIAGNOSIS — R42 Dizziness and giddiness: Secondary | ICD-10-CM | POA: Diagnosis not present

## 2018-01-10 DIAGNOSIS — G4452 New daily persistent headache (NDPH): Secondary | ICD-10-CM | POA: Insufficient documentation

## 2018-01-10 DIAGNOSIS — H9190 Unspecified hearing loss, unspecified ear: Secondary | ICD-10-CM | POA: Diagnosis not present

## 2018-01-10 DIAGNOSIS — J329 Chronic sinusitis, unspecified: Secondary | ICD-10-CM | POA: Diagnosis not present

## 2018-01-10 DIAGNOSIS — J310 Chronic rhinitis: Secondary | ICD-10-CM | POA: Diagnosis not present

## 2018-01-12 DIAGNOSIS — G4452 New daily persistent headache (NDPH): Secondary | ICD-10-CM | POA: Diagnosis not present

## 2018-01-17 DIAGNOSIS — H3581 Retinal edema: Secondary | ICD-10-CM | POA: Diagnosis not present

## 2018-01-17 DIAGNOSIS — H35342 Macular cyst, hole, or pseudohole, left eye: Secondary | ICD-10-CM | POA: Diagnosis not present

## 2018-01-17 DIAGNOSIS — H30033 Focal chorioretinal inflammation, peripheral, bilateral: Secondary | ICD-10-CM | POA: Diagnosis not present

## 2018-01-17 DIAGNOSIS — E113511 Type 2 diabetes mellitus with proliferative diabetic retinopathy with macular edema, right eye: Secondary | ICD-10-CM | POA: Diagnosis not present

## 2018-02-23 DIAGNOSIS — M797 Fibromyalgia: Secondary | ICD-10-CM | POA: Diagnosis not present

## 2018-02-23 DIAGNOSIS — H15102 Unspecified episcleritis, left eye: Secondary | ICD-10-CM | POA: Diagnosis not present

## 2018-02-23 DIAGNOSIS — H44113 Panuveitis, bilateral: Secondary | ICD-10-CM | POA: Diagnosis not present

## 2018-02-23 DIAGNOSIS — M5413 Radiculopathy, cervicothoracic region: Secondary | ICD-10-CM | POA: Diagnosis not present

## 2018-03-07 DIAGNOSIS — E11311 Type 2 diabetes mellitus with unspecified diabetic retinopathy with macular edema: Secondary | ICD-10-CM | POA: Diagnosis not present

## 2018-03-07 DIAGNOSIS — H35371 Puckering of macula, right eye: Secondary | ICD-10-CM | POA: Diagnosis not present

## 2018-03-07 DIAGNOSIS — H3581 Retinal edema: Secondary | ICD-10-CM | POA: Diagnosis not present

## 2018-03-07 DIAGNOSIS — H348332 Tributary (branch) retinal vein occlusion, bilateral, stable: Secondary | ICD-10-CM | POA: Diagnosis not present

## 2018-03-14 ENCOUNTER — Other Ambulatory Visit: Payer: Self-pay | Admitting: Endocrinology

## 2018-04-10 ENCOUNTER — Ambulatory Visit: Payer: Medicare Other | Admitting: Family Medicine

## 2018-04-14 DIAGNOSIS — N184 Chronic kidney disease, stage 4 (severe): Secondary | ICD-10-CM | POA: Diagnosis not present

## 2018-04-14 DIAGNOSIS — N2581 Secondary hyperparathyroidism of renal origin: Secondary | ICD-10-CM | POA: Diagnosis not present

## 2018-04-14 DIAGNOSIS — D631 Anemia in chronic kidney disease: Secondary | ICD-10-CM | POA: Diagnosis not present

## 2018-04-21 ENCOUNTER — Other Ambulatory Visit: Payer: Self-pay | Admitting: Nephrology

## 2018-04-21 DIAGNOSIS — N184 Chronic kidney disease, stage 4 (severe): Secondary | ICD-10-CM

## 2018-04-26 ENCOUNTER — Encounter: Payer: Self-pay | Admitting: Family Medicine

## 2018-04-26 ENCOUNTER — Ambulatory Visit
Admission: RE | Admit: 2018-04-26 | Discharge: 2018-04-26 | Disposition: A | Discharge status: Home / Self Care | Payer: Medicare Other | Source: Ambulatory Visit | Attending: Nephrology | Admitting: Nephrology

## 2018-04-26 ENCOUNTER — Ambulatory Visit (INDEPENDENT_AMBULATORY_CARE_PROVIDER_SITE_OTHER): Payer: Medicare Other | Admitting: Family Medicine

## 2018-04-26 VITALS — BP 150/94 | HR 87 | Temp 98.4°F | Wt 110.1 lb

## 2018-04-26 DIAGNOSIS — N189 Chronic kidney disease, unspecified: Secondary | ICD-10-CM | POA: Diagnosis not present

## 2018-04-26 DIAGNOSIS — R1110 Vomiting, unspecified: Secondary | ICD-10-CM | POA: Diagnosis not present

## 2018-04-26 DIAGNOSIS — N184 Chronic kidney disease, stage 4 (severe): Secondary | ICD-10-CM

## 2018-04-26 DIAGNOSIS — R102 Pelvic and perineal pain: Secondary | ICD-10-CM

## 2018-04-26 LAB — COMPREHENSIVE METABOLIC PANEL
ALT: 23 U/L (ref 0–35)
AST: 19 U/L (ref 0–37)
Albumin: 5.1 g/dL (ref 3.5–5.2)
Alkaline Phosphatase: 83 U/L (ref 39–117)
BUN: 24 mg/dL — ABNORMAL HIGH (ref 6–23)
CO2: 16 mEq/L — ABNORMAL LOW (ref 19–32)
Calcium: 10 mg/dL (ref 8.4–10.5)
Chloride: 108 mEq/L (ref 96–112)
Creatinine, Ser: 1.34 mg/dL — ABNORMAL HIGH (ref 0.40–1.20)
GFR: 41.01 mL/min — ABNORMAL LOW (ref >60.00)
Glucose, Bld: 116 mg/dL — ABNORMAL HIGH (ref 70–99)
Potassium: 4.1 mEq/L (ref 3.5–5.1)
Sodium: 135 mEq/L (ref 135–145)
Total Bilirubin: 0.3 mg/dL (ref 0.2–1.2)
Total Protein: 8.6 g/dL — ABNORMAL HIGH (ref 6.0–8.3)

## 2018-04-26 LAB — CBC WITH DIFFERENTIAL/PLATELET
Basophils Absolute: 0 10*3/uL (ref 0.0–0.1)
Basophils Relative: 0.5 % (ref 0.0–3.0)
Eosinophils Absolute: 0 10*3/uL (ref 0.0–0.7)
Eosinophils Relative: 0.5 % (ref 0.0–5.0)
HCT: 46.6 % — ABNORMAL HIGH (ref 36.0–46.0)
Hemoglobin: 15.6 g/dL — ABNORMAL HIGH (ref 12.0–15.0)
Lymphocytes Relative: 19.1 % (ref 12.0–46.0)
Lymphs Abs: 1.5 10*3/uL (ref 0.7–4.0)
MCHC: 33.6 g/dL (ref 30.0–36.0)
MCV: 98.7 fl (ref 78.0–100.0)
Monocytes Absolute: 0.4 10*3/uL (ref 0.1–1.0)
Monocytes Relative: 5.4 % (ref 3.0–12.0)
Neutro Abs: 5.9 10*3/uL (ref 1.4–7.7)
Neutrophils Relative %: 74.5 % (ref 43.0–77.0)
Platelets: 267 10*3/uL (ref 150.0–400.0)
RBC: 4.72 Mil/uL (ref 3.87–5.11)
RDW: 13.1 % (ref 11.5–15.5)
WBC: 7.9 10*3/uL (ref 4.0–10.5)

## 2018-04-26 LAB — POCT URINALYSIS DIPSTICK
Bilirubin, UA: NEGATIVE
Blood, UA: NEGATIVE
Glucose, UA: NEGATIVE
Ketones, UA: NEGATIVE
Leukocytes, UA: NEGATIVE
Nitrite, UA: NEGATIVE
Protein, UA: NEGATIVE
Spec Grav, UA: 1.02 (ref 1.010–1.025)
Urobilinogen, UA: 0.2 E.U./dL
pH, UA: 5 (ref 5.0–8.0)

## 2018-04-26 LAB — LIPASE: Lipase: 64 U/L — ABNORMAL HIGH (ref 11.0–59.0)

## 2018-04-26 NOTE — Patient Instructions (Signed)
Vomiting, Adult  Vomiting occurs when stomach contents are thrown up and out of the mouth. Many people notice nausea before vomiting. Vomiting can make you feel weak and dehydrated. Dehydration can make you tired and thirsty, cause you to have a dry mouth, and decrease how often you urinate. Older adults and people who have other diseases or a weak immune system are at higher risk for dehydration.It is important to treat vomiting as told by your health care provider.  Follow these instructions at home:  Follow your health care provider's instructions about how to care for yourself at home.  Eating and drinking  Follow these recommendations as told by your health care provider:   Take an oral rehydration solution (ORS). This is a drink that is sold at pharmacies and retail stores.   Eat bland, easy-to-digest foods in small amounts as you are able. These foods include bananas, applesauce, rice, lean meats, toast, and crackers.   Drink clear fluids in small amounts as you are able. Clear fluids include water, ice chips, low-calorie sports drinks, and fruit juice that has water added (diluted fruit juice).   Avoid fluids that contain a lot of sugar or caffeine.   Avoid alcohol and foods that are spicy or fatty.    General instructions     Wash your hands frequently with soap and water. If soap and water are not available, use hand sanitizer. Make sure that everyone in your household washes their hands frequently.   Take over-the-counter and prescription medicines only as told by your health care provider.   Watch your condition for any changes.   Keep all follow-up visits as told by your health care provider. This is important.  Contact a health care provider if:   You have a fever.   You are not able to keep fluids down.   Your vomiting gets worse.   You have new symptoms.   You feel light-headed or dizzy.   You have a headache.   You have muscle cramps.  Get help right away if:   You have pain in  your chest, neck, arm, or jaw.   You feel extremely weak or you faint.   You have persistent vomiting.   You have vomit that is bright red or looks like black coffee grounds.   You have stools that are bloody or black, or stools that look like tar.   You have severe pain, cramping, or bloating in your abdomen.   You have a severe headache, a stiff neck, or both.   You have a rash.   You have trouble breathing or you are breathing very quickly.   Your heart is beating very quickly.   Your skin feels cold and clammy.   You feel confused.   You have pain while urinating.   You have signs of dehydration, such as:  ? Dark urine, or very little or no urine.  ? Cracked lips.  ? Dry mouth.  ? Sunken eyes.  ? Sleepiness.  ? Weakness.  These symptoms may represent a serious problem that is an emergency. Do not wait to see if the symptoms will go away. Get medical help right away. Call your local emergency services (911 in the [place] Do not drive yourself to the hospital.  This information is not intended to replace advice given to you by your health care provider. Make sure you discuss any questions you have with your health care provider.  Document Released: 12/12/2015 Document Revised: 04/22/2016 Document   Reviewed: 07/22/2015  Elsevier Interactive Patient Education  2018 Elsevier Inc.

## 2018-04-26 NOTE — Progress Notes (Signed)
Subjective:     Patient ID: Danielle Gaines, female   DOB: 1941/11/05, 77 y.o.   MRN: [id number]  HPI Patient seen as a work in with what sounds like several month process of some recurrent nausea and vomiting. She had some vomiting earlier today of what she described as greenish "bile ". She's had extensive follow-up in the past with gastroenterology  She has history of reported GERD. She has osteoporosis treated with Fosamax. History of irritable bowel syndrome. 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  No known history of gastroparesis. She states that this morning she was brushing her teeth when she had nausea and vomiting. She has generally had good appetite and no recent reported weight changes. She's had previous cholecystectomy.  She relates relatively normal bowel movements recently. She has had some occasional burning with urination over the past week but none currently. She has history of migraine headaches but none currently. She has had some mild suprapubic pain. She has some chronic intermittent vertigo.  No relation of vomiting to eating. No reported fevers. She states she's had past history of pancreatitis. 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 She had ultrasound of the kidneys earlier today. Apparently gastrologist called in Zofran yesterday.  Past Medical History:  Diagnosis Date  . Arthritis   . Benzodiazepine dependence ([place])   . Carotid arterial disease ([facility])    mild  . Chronic pain    managed by pain clinic  . Colon abnormality    didn't work right so part of it was removed at [facility]  . Depression    managed by Dr. Toy Care  . Diastolic heart failure ([place])    mild on echo 2016  . GERD (gastroesophageal reflux disease)   . IBS (irritable bowel syndrome)    sees Dr. Earlean Shawl  . Macular degeneration    goes to [facility] for this  . Migraine    managed by  neurologist  . Osteoporosis   . Vertigo    Past Surgical History:  Procedure Laterality Date  . ABDOMINAL ADHESION SURGERY    . ABDOMINAL HYSTERECTOMY    . APPENDECTOMY    . CHOLECYSTECTOMY    . COLON SURGERY    . CYSTOSCOPY W/ URETERAL STENT PLACEMENT Right 04/27/2016   Procedure: CYSTOSCOPY WITH RETROGRADE PYELOGRAM/ RIGHT URETERAL STENT PLACEMENT;  Surgeon: Ardis Hughs, MD;  Location: [facility];  Service: Urology;  Laterality: Right;  . OVARIAN CYST SURGERY    . SMALL INTESTINE SURGERY      reports that she has never smoked. She has never used smokeless tobacco. She reports that she does not drink alcohol or use drugs. family history includes Cancer in her maternal aunt; Depression in her unknown relative; Heart Problems in her sister; Heart disease in her maternal grandmother and mother; Kidney disease in her mother; Stroke in her maternal grandmother and unknown relative. Allergies  Allergen Reactions  . Sulfa Antibiotics Swelling and Other (See Comments)    Reaction:  Unspecified swelling reaction      Review of Systems  Constitutional: Negative for chills and fever.  Cardiovascular: Negative for chest pain and palpitations.  Gastrointestinal: Positive for nausea and vomiting. Negative for blood in stool, constipation and diarrhea.  Genitourinary: Positive for dysuria. Negative for difficulty urinating, flank pain and hematuria.  Neurological: Negative for dizziness.  Psychiatric/Behavioral: Negative for confusion.  Objective:   Physical Exam  Constitutional: She appears well-developed and well-nourished.  HENT:  Mouth/Throat: Oropharynx is clear and moist.  Cardiovascular: Normal rate and regular rhythm.  Pulmonary/Chest: Effort normal and breath sounds normal. She has no wheezes. She has no rales.  Abdominal: Soft. Bowel sounds are normal. She exhibits no distension and no mass. There is no rebound and no guarding.  Minimal tenderness lower abdomen right and  left side. No guarding or rebound       Assessment:     Patient presents with what sounds like several month history of some recurrent nausea and vomiting. She's not any red flags such as appetite or weight changes. She has some poorly localized diffuse lower abdominal discomfort. Etiology unclear but she has had what sounds like extensive GI workup in the past and chronic GI complaints..  S/P cholecystectomy.  No known hx of gastroparesis or gastric outlet obstruction.   Good bowel sounds present.  Urine dip does not suggest any likely UTI.    Plan:     -Check further labs with CBC, comprehensive metabolic panel, lipase, urinalysis -Hold Fosamax for now -Continue Zofran as needed for symptom relief. -May need further evaluation. It is not clear she's had any studies to rule out gastroparesis previously. Since she has established relationship with gastrologist consider follow-up with them very soon for further evaluation.  Pt will contact Dr Earlean Shawl. -follow up immediately in ER for any recurrent vomiting, fever, or progressive abdominal pain.  Eulas Post MD [facility]

## 2018-05-09 DIAGNOSIS — H348312 Tributary (branch) retinal vein occlusion, right eye, stable: Secondary | ICD-10-CM | POA: Diagnosis not present

## 2018-05-09 DIAGNOSIS — H348332 Tributary (branch) retinal vein occlusion, bilateral, stable: Secondary | ICD-10-CM | POA: Diagnosis not present

## 2018-05-09 DIAGNOSIS — H35371 Puckering of macula, right eye: Secondary | ICD-10-CM | POA: Diagnosis not present

## 2018-05-09 DIAGNOSIS — H3581 Retinal edema: Secondary | ICD-10-CM | POA: Diagnosis not present

## 2018-05-09 DIAGNOSIS — H35342 Macular cyst, hole, or pseudohole, left eye: Secondary | ICD-10-CM | POA: Diagnosis not present

## 2018-05-09 DIAGNOSIS — H44113 Panuveitis, bilateral: Secondary | ICD-10-CM | POA: Diagnosis not present

## 2018-05-25 ENCOUNTER — Encounter: Payer: Self-pay | Admitting: Family Medicine

## 2018-05-25 DIAGNOSIS — R112 Nausea with vomiting, unspecified: Secondary | ICD-10-CM | POA: Diagnosis not present

## 2018-05-26 ENCOUNTER — Telehealth: Payer: Self-pay | Admitting: Family Medicine

## 2018-05-26 DIAGNOSIS — M810 Age-related osteoporosis without current pathological fracture: Secondary | ICD-10-CM

## 2018-05-26 DIAGNOSIS — Z1239 Encounter for other screening for malignant neoplasm of breast: Secondary | ICD-10-CM

## 2018-05-26 NOTE — Telephone Encounter (Signed)
After reviewing the last bone density it looks like osteoporosis was to be managed by Dr Loanne Drilling, but there is another order by Dr Maudie Mercury from 04/2017.  New orders placed for each test and I called the pt and informed her husband of this.

## 2018-05-26 NOTE — Telephone Encounter (Signed)
Pts husband Chailyn Racette) stopped by the office and was wondering when pts Bone Density will be scheduled it was due back in January per the pt.  Spouse state that the pt would like to have a mammogram and bone density done at the same office and she does not know where it will be done at but would like to have it done the same day.  They would like to have a call to discuss this in detail.

## 2018-06-11 ENCOUNTER — Other Ambulatory Visit: Payer: Self-pay | Admitting: Endocrinology

## 2018-06-21 DIAGNOSIS — M5030 Other cervical disc degeneration, unspecified cervical region: Secondary | ICD-10-CM | POA: Diagnosis not present

## 2018-06-21 DIAGNOSIS — M546 Pain in thoracic spine: Secondary | ICD-10-CM | POA: Diagnosis not present

## 2018-06-28 ENCOUNTER — Ambulatory Visit (INDEPENDENT_AMBULATORY_CARE_PROVIDER_SITE_OTHER): Payer: Medicare Other | Admitting: Family Medicine

## 2018-06-28 ENCOUNTER — Ambulatory Visit: Payer: Self-pay | Admitting: *Deleted

## 2018-06-28 ENCOUNTER — Encounter: Payer: Self-pay | Admitting: Family Medicine

## 2018-06-28 VITALS — BP 130/90 | HR 93 | Temp 98.0°F | Wt 108.6 lb

## 2018-06-28 DIAGNOSIS — R42 Dizziness and giddiness: Secondary | ICD-10-CM

## 2018-06-28 DIAGNOSIS — G3184 Mild cognitive impairment, so stated: Secondary | ICD-10-CM

## 2018-06-28 DIAGNOSIS — R5383 Other fatigue: Secondary | ICD-10-CM | POA: Diagnosis not present

## 2018-06-28 NOTE — Telephone Encounter (Signed)
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   Reason for Disposition . [1] MODERATE dizziness (e.g., vertigo; feels very unsteady, interferes with normal activities) AND [2] has NOT been evaluated by physician for this  Answer Assessment - Initial Assessment Questions 1. DESCRIPTION: "Describe your dizziness."     Dizzy but no nausea 2. VERTIGO: "Do you feel like either you or the room is spinning or tilting?"      spinning 3. LIGHTHEADED: "Do you feel lightheaded?" (e.g., somewhat faint, woozy, weak upon standing)     Wobbly on feet 4. SEVERITY: "How bad is it?"  "Can you walk?"   - MILD - Feels unsteady but walking normally.   - MODERATE - Feels very unsteady when walking, but not falling; interferes with normal activities (e.g., school, work) .   - SEVERE - Unable to walk without falling (requires assistance).     Moderate; uses a cane 5. ONSET:  "When did the dizziness begin?"     Several weeks ago 6. AGGRAVATING FACTORS: "Does anything make it worse?" (e.g., standing, change in head position)     standing 7. CAUSE: "What do you  think is causing the dizziness?"     unsure 8. RECURRENT SYMPTOM: "Have you had dizziness before?" If so, ask: "When was the last time?" "What happened that time?"     Yes, a few months ago  9. OTHER SYMPTOMS: "Do you have any other symptoms?" (e.g., headache, weakness, numbness, vomiting, earache)    Right Earache at times but doesn't "bother me" 10. PREGNANCY: "Is there any chance you are pregnant?" "When was your last menstrual period?"       no  Protocols used: DIZZINESS - VERTIGO-A-AH

## 2018-06-28 NOTE — Progress Notes (Signed)
Subjective:     Patient ID: Danielle Gaines, female   DOB: [birth year], 77 y.o.   MRN: [id number]  HPI Patient has history of migraine headaches, GERD, osteoporosis, depression. She is seen today as a work in with one-week history of feeling "off balance ". She describes what sounds like some intermittent vertigo symptoms. Sometimes at rest but mostly triggered by movement. She states she feels somewhat slow cognitively over the past couple weeks. She has not had any speech changes, swallowing difficulties, ataxia, or any focal weakness. No recent hearing changes.  She has frequent chronic headaches and is followed by neurology. History of migraine headaches. Recent increase in Topamax. She is on very high dosage of 300 mg daily. Also takes lorazepam 1 in the morning and 2 at night per psychiatry. Has also seen orthopedics recently for some left hip pain and was prescribed baclofen 10 mg 3 times a day. She felt her dizziness was worse after starting the baclofen. She's not been on this now for several days.  Denies any fevers. She has occasional reported chills. Mild right earache. No dysuria. No cough.  Past Medical History:  Diagnosis Date  . Arthritis   . Benzodiazepine dependence ([place])   . Carotid arterial disease (HCC)    mild  . Chronic pain    managed by pain clinic  . Colon abnormality    didn't work right so part of it was removed at [facility]  . Depression    managed by Dr. Toy Care  . Diastolic heart failure ([place])    mild on echo 2016  . GERD (gastroesophageal reflux disease)   . IBS (irritable bowel syndrome)    sees Dr. Earlean Shawl  . Macular degeneration    goes to [facility] for this  . Migraine    managed by neurologist  . Osteoporosis   . Vertigo    Past Surgical History:  Procedure Laterality Date  . ABDOMINAL ADHESION SURGERY    . ABDOMINAL HYSTERECTOMY    . APPENDECTOMY    . CHOLECYSTECTOMY    . COLON SURGERY    . CYSTOSCOPY W/ URETERAL STENT PLACEMENT Right  04/27/2016   Procedure: CYSTOSCOPY WITH RETROGRADE PYELOGRAM/ RIGHT URETERAL STENT PLACEMENT;  Surgeon: Ardis Hughs, MD;  Location: [facility];  Service: Urology;  Laterality: Right;  . OVARIAN CYST SURGERY    . SMALL INTESTINE SURGERY      reports that she has never smoked. She has never used smokeless tobacco. She reports that she does not drink alcohol or use drugs. family history includes Cancer in her maternal aunt; Depression in her unknown relative; Heart Problems in her sister; Heart disease in her maternal grandmother and mother; Kidney disease in her mother; Stroke in her maternal grandmother and unknown relative. Allergies  Allergen Reactions  . Sulfa Antibiotics Swelling and Other (See Comments)    Reaction:  Unspecified swelling reaction      Review of Systems  Constitutional: Positive for chills. Negative for fever and unexpected weight change.  Respiratory: Negative for shortness of breath.   Cardiovascular: Negative for chest pain.  Gastrointestinal: Negative for abdominal pain.  Genitourinary: Negative for dysuria.  Neurological: Positive for dizziness, light-headedness and headaches. Negative for syncope and speech difficulty.  Psychiatric/Behavioral: Negative for confusion.       Objective:   Physical Exam  Constitutional: She is oriented to person, place, and time. She appears well-developed and well-nourished.  HENT:  Right Ear: External ear normal.  Left Ear: External ear normal.  Mouth/Throat: Oropharynx is clear and moist.  Eyes: Pupils are equal, round, and reactive to light. EOM are normal.  Neck: Neck supple. No thyromegaly present.  Cardiovascular: Normal rate and regular rhythm.  Pulmonary/Chest: Effort normal and breath sounds normal.  Musculoskeletal: She exhibits no edema.  Lymphadenopathy:    She has no cervical adenopathy.  Neurological: She is alert and oriented to person, place, and time. She displays normal reflexes. No cranial nerve  deficit. Coordination normal.  No nystagmus. Negative Dix Hallpike maneuver  Psychiatric: She has a normal mood and affect. Her behavior is normal. Thought content normal.       Assessment:     Patient presents with one-week history of nonspecific dizziness. She is describing what sounds like more of a vertigo -though none reproducible on exam and nonfocal neuro exam. She is on multiple medications that can contribute to dizziness including lorazepam, recent baclofen, and very high dose of Topamax    Plan:     -We've recommended that she consult with her neurologist regarding dizziness which seems to correlate possibly with recent increased dose of Topamax. -Leave off baclofen -Consider discussion with psychiatrist whether she could scale back gradually on her lorazepam dosage -Reviewed signs and symptoms of things to look for such as focal weakness, confusion, progressive headache, slurred speech, visual changes -check BMP and TSH.  Eulas Post MD [facility] at [facility]

## 2018-06-28 NOTE — Patient Instructions (Signed)
Call you neurologist and report dizziness to them- ? Side effect from Topamax

## 2018-06-29 ENCOUNTER — Other Ambulatory Visit: Payer: Self-pay

## 2018-06-29 ENCOUNTER — Emergency Department (HOSPITAL_COMMUNITY): Payer: Medicare Other

## 2018-06-29 ENCOUNTER — Encounter (HOSPITAL_COMMUNITY): Payer: Self-pay | Admitting: Emergency Medicine

## 2018-06-29 ENCOUNTER — Emergency Department (HOSPITAL_COMMUNITY)
Admission: EM | Admit: 2018-06-29 | Discharge: 2018-06-29 | Disposition: A | Discharge status: Home / Self Care | Payer: Medicare Other | Attending: Emergency Medicine | Admitting: Emergency Medicine

## 2018-06-29 ENCOUNTER — Telehealth: Payer: Self-pay | Admitting: *Deleted

## 2018-06-29 DIAGNOSIS — I5032 Chronic diastolic (congestive) heart failure: Secondary | ICD-10-CM | POA: Diagnosis not present

## 2018-06-29 DIAGNOSIS — R4781 Slurred speech: Secondary | ICD-10-CM | POA: Diagnosis not present

## 2018-06-29 DIAGNOSIS — Z79899 Other long term (current) drug therapy: Secondary | ICD-10-CM | POA: Insufficient documentation

## 2018-06-29 DIAGNOSIS — R4182 Altered mental status, unspecified: Secondary | ICD-10-CM | POA: Diagnosis present

## 2018-06-29 DIAGNOSIS — G43909 Migraine, unspecified, not intractable, without status migrainosus: Secondary | ICD-10-CM | POA: Diagnosis not present

## 2018-06-29 DIAGNOSIS — G43809 Other migraine, not intractable, without status migrainosus: Secondary | ICD-10-CM | POA: Diagnosis not present

## 2018-06-29 DIAGNOSIS — G43109 Migraine with aura, not intractable, without status migrainosus: Secondary | ICD-10-CM

## 2018-06-29 LAB — BASIC METABOLIC PANEL
BUN: 29 mg/dL — ABNORMAL HIGH (ref 6–23)
CO2: 17 mEq/L — ABNORMAL LOW (ref 19–32)
Calcium: 9.7 mg/dL (ref 8.4–10.5)
Chloride: 111 mEq/L (ref 96–112)
Creatinine, Ser: 1.13 mg/dL (ref 0.40–1.20)
GFR: 49.9 mL/min — ABNORMAL LOW (ref >60.00)
Glucose, Bld: 120 mg/dL — ABNORMAL HIGH (ref 70–99)
Potassium: 4.5 mEq/L (ref 3.5–5.1)
Sodium: 139 mEq/L (ref 135–145)

## 2018-06-29 LAB — PROTIME-INR
INR: 0.94
Prothrombin Time: 12.5 seconds (ref 11.4–15.2)

## 2018-06-29 LAB — URINALYSIS, ROUTINE W REFLEX MICROSCOPIC
Bilirubin Urine: NEGATIVE
Glucose, UA: NEGATIVE mg/dL
Hgb urine dipstick: NEGATIVE
Ketones, ur: NEGATIVE mg/dL
Leukocytes, UA: NEGATIVE
Nitrite: NEGATIVE
Protein, ur: NEGATIVE mg/dL
Specific Gravity, Urine: 1.015 (ref 1.005–1.030)
pH: 6 (ref 5.0–8.0)

## 2018-06-29 LAB — COMPREHENSIVE METABOLIC PANEL
ALT: 24 U/L (ref 0–44)
AST: 21 U/L (ref 15–41)
Albumin: 4.5 g/dL (ref 3.5–5.0)
Alkaline Phosphatase: 60 U/L (ref 38–126)
Anion gap: 6 (ref 5–15)
BUN: 27 mg/dL — ABNORMAL HIGH (ref 8–23)
CO2: 21 mmol/L — ABNORMAL LOW (ref 22–32)
Calcium: 9.4 mg/dL (ref 8.9–10.3)
Chloride: 116 mmol/L — ABNORMAL HIGH (ref 98–111)
Creatinine, Ser: 1.47 mg/dL — ABNORMAL HIGH (ref 0.44–1.00)
GFR calc Af Amer: 39 mL/min — ABNORMAL LOW (ref >60)
GFR calc non Af Amer: 34 mL/min — ABNORMAL LOW (ref >60)
Glucose, Bld: 112 mg/dL — ABNORMAL HIGH (ref 70–99)
Potassium: 4.3 mmol/L (ref 3.5–5.1)
Sodium: 143 mmol/L (ref 135–145)
Total Bilirubin: 0.6 mg/dL (ref 0.3–1.2)
Total Protein: 7.8 g/dL (ref 6.5–8.1)

## 2018-06-29 LAB — RAPID URINE DRUG SCREEN, HOSP PERFORMED
Amphetamines: POSITIVE — AB
Barbiturates: NOT DETECTED
Benzodiazepines: NOT DETECTED
Cocaine: NOT DETECTED
Opiates: NOT DETECTED
Tetrahydrocannabinol: NOT DETECTED

## 2018-06-29 LAB — I-STAT CHEM 8, ED
BUN: 26 mg/dL — ABNORMAL HIGH (ref 8–23)
Calcium, Ion: 1.3 mmol/L (ref 1.15–1.40)
Chloride: 115 mmol/L — ABNORMAL HIGH (ref 98–111)
Creatinine, Ser: 1.5 mg/dL — ABNORMAL HIGH (ref 0.44–1.00)
Glucose, Bld: 96 mg/dL (ref 70–99)
HCT: 46 % (ref 36.0–46.0)
Hemoglobin: 15.6 g/dL — ABNORMAL HIGH (ref 12.0–15.0)
Potassium: 3.8 mmol/L (ref 3.5–5.1)
Sodium: 144 mmol/L (ref 135–145)
TCO2: 16 mmol/L — ABNORMAL LOW (ref 22–32)

## 2018-06-29 LAB — DIFFERENTIAL
Basophils Absolute: 0 10*3/uL (ref 0.0–0.1)
Basophils Relative: 0 %
Eosinophils Absolute: 0.1 10*3/uL (ref 0.0–0.7)
Eosinophils Relative: 1 %
Lymphocytes Relative: 28 %
Lymphs Abs: 1.7 10*3/uL (ref 0.7–4.0)
Monocytes Absolute: 0.5 10*3/uL (ref 0.1–1.0)
Monocytes Relative: 8 %
Neutro Abs: 3.7 10*3/uL (ref 1.7–7.7)
Neutrophils Relative %: 63 %

## 2018-06-29 LAB — CBC
HCT: 42.3 % (ref 36.0–46.0)
Hemoglobin: 14.3 g/dL (ref 12.0–15.0)
MCH: 32.8 pg (ref 26.0–34.0)
MCHC: 33.8 g/dL (ref 30.0–36.0)
MCV: 97 fL (ref 78.0–100.0)
Platelets: 268 10*3/uL (ref 150–400)
RBC: 4.36 MIL/uL (ref 3.87–5.11)
RDW: 13.1 % (ref 11.5–15.5)
WBC: 6 10*3/uL (ref 4.0–10.5)

## 2018-06-29 LAB — I-STAT TROPONIN, ED: Troponin i, poc: 0 ng/mL (ref 0.00–0.08)

## 2018-06-29 LAB — ETHANOL: Alcohol, Ethyl (B): <10 mg/dL (ref <10)

## 2018-06-29 LAB — APTT: aPTT: 32 seconds (ref 24–36)

## 2018-06-29 LAB — TSH: TSH: 3.47 u[IU]/mL (ref 0.35–4.50)

## 2018-06-29 MED ORDER — DIPHENHYDRAMINE HCL 50 MG/ML IJ SOLN
25.0000 mg | Freq: Once | INTRAMUSCULAR | Status: AC
  Administered 2018-06-29: 25 mg via INTRAVENOUS
  Filled 2018-06-29: qty 1

## 2018-06-29 MED ORDER — METOCLOPRAMIDE HCL 5 MG/ML IJ SOLN
10.0000 mg | Freq: Once | INTRAMUSCULAR | Status: AC
  Administered 2018-06-29: 10 mg via INTRAVENOUS
  Filled 2018-06-29: qty 2

## 2018-06-29 MED ORDER — SODIUM CHLORIDE 0.9 % IV SOLN
INTRAVENOUS | Status: DC
  Administered 2018-06-29: 17:00:00 via INTRAVENOUS

## 2018-06-29 MED ORDER — DEXAMETHASONE SODIUM PHOSPHATE 10 MG/ML IJ SOLN
10.0000 mg | Freq: Once | INTRAMUSCULAR | Status: AC
  Administered 2018-06-29: 10 mg via INTRAVENOUS
  Filled 2018-06-29: qty 1

## 2018-06-29 NOTE — Discharge Instructions (Addendum)
Make an appointment to follow back up with your neurologist in [place].  We suspect that this is a complicated migraine.  Some of the medications you were given today may help abate the headache completely by morning.  CT of head and MRI of brain without any acute findings no evidence of stroke.  Discussed with our neuro hospitalist and they concur that this is most likely complicated migraine.  Also recommend keeping your eye appointment and having your eye doctor evaluate your eyes more closely as scheduled for tomorrow.

## 2018-06-29 NOTE — Telephone Encounter (Signed)
I called the pt and informed her of the message below.  She stated she will have her husband call the neurologist today.

## 2018-06-29 NOTE — ED Triage Notes (Signed)
Patient BIB family, reports on Sunday she began feeling disoriented with slurred speech, headache, and blurred vision. Husband reports he spoke with PCP today who told him to call neurology and neurology sent patient here for MRI.

## 2018-06-29 NOTE — ED Provider Notes (Signed)
[facility] DEPT Provider Note   CSN: [id number] Arrival date & time: 06/29/18  1532     History   Chief Complaint Chief Complaint  Patient presents with  . Altered Mental Status    HPI Danielle Gaines is a 77 y.o. female.  Patient brought in by family.  Patient reports that on Sunday she began feeling disoriented with slurred speech had some visual focus problems had a generalized headache.  Vision was somewhat blurred but there was no visual field cut.  Patient did not have any arm or leg weakness but did have a little bit of gait problems.  Husband spoke with primary care provider today who had them call neurology and neurology sensation in for further evaluation.  Patient states that the headache persisted until about Tuesday then started to improve however though not completely resolved still present and that the vision is still abnormal but the speech is returned to normal as of today.  As stated patient has a history of migraines but headache is not typical for that.  No fevers.  Patient has a history of macular degeneration and is followed by a retinal specialist and once a month gets injections in her eyes.  But nothing different about that.  Patient also has a history of chronic neck and back pain and gets injections in those areas but nothing new or different about that.     Past Medical History:  Diagnosis Date  . Arthritis   . Benzodiazepine dependence ([place])   . Carotid arterial disease (HCC)    mild  . Chronic pain    managed by pain clinic  . Colon abnormality    didn't work right so part of it was removed at [facility]  . Depression    managed by Dr. Toy Care  . Diastolic heart failure ([place])    mild on echo 2016  . GERD (gastroesophageal reflux disease)   . IBS (irritable bowel syndrome)    sees Dr. Earlean Shawl  . Macular degeneration    goes to [facility] for this  . Migraine    managed by neurologist  . Osteoporosis   . Vertigo      Patient Active Problem List   Diagnosis Date Noted  . Chronic pain 11/19/2016  . Orthostatic hypotension 11/19/2016  . MDD (major depressive disorder), recurrent severe, without psychosis ([place]) 04/09/2015  . Osteoporosis 07/24/2014  . Migraine - followed by Dr. Sima Matas in Neurology 05/24/2014  . Anxiety and depression - managed at [facility] 05/24/2014  . GERD (gastroesophageal reflux disease) 05/24/2014  . Macular degeneration - [facility] to [facility] for [facility] 05/24/2014    Past Surgical History:  Procedure Laterality Date  . ABDOMINAL ADHESION SURGERY    . ABDOMINAL HYSTERECTOMY    . APPENDECTOMY    . CHOLECYSTECTOMY    . COLON SURGERY    . CYSTOSCOPY W/ URETERAL STENT PLACEMENT Right 04/27/2016   Procedure: CYSTOSCOPY WITH RETROGRADE PYELOGRAM/ RIGHT URETERAL STENT PLACEMENT;  Surgeon: Ardis Hughs, MD;  Location: [facility];  Service: Urology;  Laterality: Right;  . OVARIAN CYST SURGERY    . SMALL INTESTINE SURGERY       OB History    Gravida  1   Para  1   Term  0   Preterm  0   AB  0   Living  1     SAB  0   TAB  0   Ectopic  0   Multiple  0  Live Births  0            Home Medications    Prior to Admission medications   Medication Sig Start Date End Date Taking? Authorizing Provider  acetaminophen (TYLENOL) 500 MG tablet Take 1,000 mg by mouth daily as needed for headache.   Yes [provider]  alendronate (FOSAMAX) 70 MG tablet TAKE 1 TABLET BY MOUTH ONCE WEEKLY WITH A FULL GLASS OF WATER ON AN EMPTY STOMACH 06/11/18  Yes Renato Shin, MD  amphetamine-dextroamphetamine (ADDERALL) 30 MG tablet Take 30 mg by mouth daily.   Yes [provider]  Biotin 1000 MCG tablet Take 1,000 mcg by mouth daily.   Yes [provider]  buPROPion (WELLBUTRIN XL) 150 MG 24 hr tablet Take 2 tablets daily   Yes [provider]  cycloSPORINE (RESTASIS) 0.05 % ophthalmic emulsion Place 1 drop into both eyes 2 (two) times daily.     Yes [provider]  LINZESS 290 MCG CAPS capsule Take 290 mcg by mouth daily before breakfast.    Yes [provider]  LORazepam (ATIVAN) 0.5 MG tablet Take 0.5 mg by mouth 3 (three) times daily as needed for anxiety.   Yes [provider]  olopatadine (PATANOL) 0.1 % ophthalmic solution 1 drop 2 (two) times daily.   Yes [provider]  omeprazole (PRILOSEC) 40 MG capsule Take 80 mg by mouth daily.    Yes [provider]  Probiotic Product (ALIGN PO) Take by mouth daily.   Yes [provider]  SUMAtriptan (IMITREX) 100 MG tablet Take 100 mg by mouth every 2 (two) hours as needed for migraine.    Yes [provider]  topiramate (TOPAMAX) 100 MG tablet Take 3 tablets at bedtime   Yes [provider]  ZENPEP 15000 units CPEP Take 45,000 mg by mouth 3 (three) times daily before meals.    Yes [provider]    Family History Family History  Problem Relation Age of Onset  . Kidney disease Mother   . Heart disease Mother   . Heart Problems Sister   . Cancer Maternal Aunt        intestinal and liver cancer  . Stroke Maternal Grandmother   . Heart disease Maternal Grandmother   . Stroke Unknown   . Depression Unknown        everyone  . Hypercalcemia Neg Hx     Social History Social History   Tobacco Use  . Smoking status: Never Smoker  . Smokeless tobacco: Never Used  Substance Use Topics  . Alcohol use: No  . Drug use: No     Allergies   Sulfa antibiotics   Review of Systems Review of Systems  Constitutional: Negative for fever.  HENT: Negative for congestion and sinus pain.   Eyes: Positive for visual disturbance. Negative for pain.  Respiratory: Negative for shortness of breath.   Cardiovascular: Negative for chest pain.  Gastrointestinal: Negative for abdominal pain, nausea and vomiting.  Genitourinary: Negative for dysuria.  Musculoskeletal: Positive for back pain and neck pain.   Neurological: Positive for speech difficulty and headaches. Negative for dizziness, syncope, facial asymmetry, weakness and numbness.  Hematological: Does not bruise/bleed easily.  Psychiatric/Behavioral: Positive for confusion.     Physical Exam Updated Vital Signs BP 116/65 (BP Location: Left Arm)   Pulse 90   Temp 98.8 F (37.1 C)   Resp 19   SpO2 97%   Physical Exam  Constitutional: She is oriented to  person, place, and time. She appears well-developed and well-nourished. No distress.  HENT:  Head: Normocephalic and atraumatic.  Mouth/Throat: Oropharynx is clear and moist.  Eyes: Pupils are equal, round, and reactive to light. Conjunctivae and EOM are normal.  Neck: Neck supple.  Cardiovascular: Normal rate, regular rhythm and normal heart sounds.  Pulmonary/Chest: Effort normal and breath sounds normal. No respiratory distress.  Abdominal: Soft. Bowel sounds are normal. There is no tenderness.  Musculoskeletal: Normal range of motion. She exhibits no edema.  Neurological: She is alert and oriented to person, place, and time. No cranial nerve deficit or sensory deficit. She exhibits normal muscle tone. Coordination normal.  Patient speech now felt to be normal by family.  No evidence of any visual cut.  Patient did subjectively state that the vision seemed blurred.  No focal motor weakness.  Skin: Skin is warm. No rash noted.  Nursing note and vitals reviewed.    ED Treatments / Results  Labs (all labs ordered are listed, but only abnormal results are displayed) Labs Reviewed  COMPREHENSIVE METABOLIC PANEL - Abnormal; Notable for the following components:      Result Value   Chloride 116 (*)    CO2 21 (*)    Glucose, Bld 112 (*)    BUN 27 (*)    Creatinine, Ser 1.47 (*)    GFR calc non Af Amer 34 (*)    GFR calc Af Amer 39 (*)    All other components within normal limits  RAPID URINE DRUG SCREEN, HOSP PERFORMED - Abnormal; Notable for the following components:    Amphetamines POSITIVE (*)    All other components within normal limits  I-STAT CHEM 8, ED - Abnormal; Notable for the following components:   Chloride 115 (*)    BUN 26 (*)    Creatinine, Ser 1.50 (*)    TCO2 16 (*)    Hemoglobin 15.6 (*)    All other components within normal limits  CBC  ETHANOL  PROTIME-INR  APTT  URINALYSIS, ROUTINE W REFLEX MICROSCOPIC  DIFFERENTIAL  CBG MONITORING, ED  I-STAT TROPONIN, ED    EKG EKG Interpretation  Date/Time:  Thursday June 29 2018 17:19:09 EDT Ventricular Rate:  82 PR Interval:    QRS Duration: 79 QT Interval:  375 QTC Calculation: 438 R Axis:   2 Text Interpretation:  Sinus rhythm Anterior infarct, old No significant change since last tracing Confirmed by Fredia Sorrow [phone]) on 06/29/2018 5:59:48 PM   Radiology Ct Head Wo Contrast  Result Date: 06/29/2018 CLINICAL DATA:  Right-sided hand weakness and slurred speech. EXAM: CT HEAD WITHOUT CONTRAST TECHNIQUE: Contiguous axial images were obtained from the base of the skull through the vertex without intravenous contrast. COMPARISON:  11/04/2016 FINDINGS: Brain: No evidence of acute infarction, hemorrhage, hydrocephalus, extra-axial collection or mass lesion/mass effect. Moderate brain parenchymal volume loss and deep white matter microangiopathy. Vascular: No hyperdense vessel or unexpected calcification. Skull: Normal. Negative for fracture or focal lesion. Sinuses/Orbits: No acute finding. Other: None. IMPRESSION: No acute intracranial abnormality. Moderate brain parenchymal volume loss and chronic microvascular disease. Electronically Signed   By: Fidela Salisbury M.D.   On: 06/29/2018 18:03   Mr Brain Wo Contrast (neuro Protocol)  Result Date: 06/29/2018 CLINICAL DATA:  Thunderclap headache. Slurred speech and blurry vision. EXAM: MRI HEAD WITHOUT CONTRAST TECHNIQUE: Multiplanar, multiecho pulse sequences of the brain and surrounding structures were obtained without intravenous  contrast. COMPARISON:  Head CT 06/29/2018 FINDINGS: BRAIN: There is no acute infarct, acute  hemorrhage or mass effect. The midline structures are normal. There are no old infarcts. Minimal white matter hyperintensity, nonspecific and commonly seen in asymptomatic patients of this age. Generalized atrophy without lobar predilection. Susceptibility-sensitive sequences show no chronic microhemorrhage or superficial siderosis. VASCULAR: Major intracranial arterial and venous sinus flow voids are preserved. SKULL AND UPPER CERVICAL SPINE: The visualized skull base, calvarium, upper cervical spine and extracranial soft tissues are normal. SINUSES/ORBITS: No fluid levels or advanced mucosal thickening. No mastoid or middle ear effusion. The orbits are normal. IMPRESSION: Mild atrophy.  No acute abnormality. Electronically Signed   By: Ulyses Jarred M.D.   On: 06/29/2018 20:33    Procedures Procedures (including critical care time)  Medications Ordered in ED Medications  0.9 %  sodium chloride infusion ( Intravenous Rate/Dose Verify 06/29/18 1757)  diphenhydrAMINE (BENADRYL) injection 25 mg (25 mg Intravenous Given 06/29/18 1833)  metoCLOPramide (REGLAN) injection 10 mg (10 mg Intravenous Given 06/29/18 1832)  dexamethasone (DECADRON) injection 10 mg (10 mg Intravenous Given 06/29/18 1832)     Initial Impression / Assessment and Plan / ED Course  I have reviewed the triage vital signs and the nursing notes.  Pertinent labs & imaging results that were available during my care of the patient were reviewed by me and considered in my medical decision making (see chart for details).     Patient with work-up for potential stroke.  Head CT negative MRI brain negative no evidence of stroke.  Since patient has a history of migraines.  Patient was given migraine cocktail with Reglan Decadron Benadryl.  Was some improvement.  Patient feels speech is completely back to normal now.  Patient also feels that vision is  improved.  But still has headache but not as severe.  Discussed with neuro hospitalist they agree that this sounds like this may very well be an atypical complicated migraine for this patient they feel stroke is not likely based on the duration of symptoms and a negative MRI.  Patient is followed by neurology in [place].  She can make an appointment to follow-up with that.  She does have follow-up already scheduled for tomorrow with her retinal specialist.  However do not feel that this is a primary complaint because it would is not explained all the symptoms.  Final Clinical Impressions(s) / ED Diagnoses   Final diagnoses:  Complicated migraine    ED Discharge Orders    None       Fredia Sorrow, MD 06/29/18 2333

## 2018-06-29 NOTE — ED Notes (Signed)
Patient went to bathroom in wheel-chair with no trouble. Patient then transported to MRI.

## 2018-06-29 NOTE — Telephone Encounter (Signed)
Pt stated that her Topamax had been increased recently.  I think best course of action would be for her to follow up with her neurologist as soon as possible.  They may decide to do some imaging.

## 2018-06-29 NOTE — ED Notes (Signed)
Urine culture in lab if needed.  

## 2018-06-29 NOTE — ED Notes (Signed)
Will obtain new vital signs once patient returns from MRI.

## 2018-06-29 NOTE — Telephone Encounter (Signed)
Patient's husband came in to the office. He stated he was not here with the pt yesterday when she was seen by Dr Elease Hashimoto, she did not give correct list of medications she takes vs no longer takes and he wanted to let us know.  I updated the pts med list per Mr Bodkins.  He also stated the pt thinks she may have had a stroke since on Sunday she suddenly dropped a glass of water and has had recurrent dizziness.  He also stated Dr Elease Hashimoto questioned if the dose of Topamax could be causing this and he stated she been taking Topamax for years.  Mr Gulas stated he will call the neurologist today and the pt is scheduled for eye injections for macular edema at [facility] tomorrow.  I advised he let the physician at [facility] know about the recent symptoms she has had and he agreed.  Message sent to Dr Elease Hashimoto as pt was seen yesterday and Dr Maudie Mercury is out of the office.

## 2018-07-10 DIAGNOSIS — N184 Chronic kidney disease, stage 4 (severe): Secondary | ICD-10-CM | POA: Diagnosis not present

## 2018-07-13 DIAGNOSIS — M545 Low back pain: Secondary | ICD-10-CM | POA: Diagnosis not present

## 2018-07-14 DIAGNOSIS — H3581 Retinal edema: Secondary | ICD-10-CM | POA: Diagnosis not present

## 2018-07-14 DIAGNOSIS — H35342 Macular cyst, hole, or pseudohole, left eye: Secondary | ICD-10-CM | POA: Diagnosis not present

## 2018-07-14 DIAGNOSIS — H348332 Tributary (branch) retinal vein occlusion, bilateral, stable: Secondary | ICD-10-CM | POA: Diagnosis not present

## 2018-07-14 DIAGNOSIS — H35371 Puckering of macula, right eye: Secondary | ICD-10-CM | POA: Diagnosis not present

## 2018-07-21 DIAGNOSIS — N2581 Secondary hyperparathyroidism of renal origin: Secondary | ICD-10-CM | POA: Diagnosis not present

## 2018-07-21 DIAGNOSIS — D631 Anemia in chronic kidney disease: Secondary | ICD-10-CM | POA: Diagnosis not present

## 2018-07-21 DIAGNOSIS — N183 Chronic kidney disease, stage 3 (moderate): Secondary | ICD-10-CM | POA: Diagnosis not present

## 2018-08-01 ENCOUNTER — Encounter: Payer: Self-pay | Admitting: Family Medicine

## 2018-08-01 DIAGNOSIS — N183 Chronic kidney disease, stage 3 unspecified: Secondary | ICD-10-CM | POA: Insufficient documentation

## 2018-08-01 DIAGNOSIS — D631 Anemia in chronic kidney disease: Secondary | ICD-10-CM | POA: Insufficient documentation

## 2018-08-01 DIAGNOSIS — N189 Chronic kidney disease, unspecified: Secondary | ICD-10-CM

## 2018-08-01 HISTORY — DX: Chronic kidney disease, stage 3 unspecified: N18.30

## 2018-08-02 NOTE — Progress Notes (Signed)
HPI:  Using dictation device. Unfortunately this device frequently misinterprets words/phrases.  Danielle Gaines is a pleasant 77 yo with a complicated pmh listed below here for an acute visit for: Due for flu shot.  N/V/D: -started 6 days ago and lasted 2 days - had 5 episodes emesis and 3-4 watery bowels per day for 2 days, symptoms have now resolved for 3-4 days -having normal bowels now, eating and drinking fluids now -hx polypharmacy, sees a number of specialist and I have advised many times that she work with them to reduce medications as I do not prescribe any of them -hx of chronic GERD/IBS/chronic bowel issues and sees Dr. Earlean Shawl, gastroenterology for this; had EGD for nausea/vomiting GI issues 9/19 that was normal; per GI notes it was felt polypharmacy was the cause of her symptoms -had ER eval 8/1 for presumed complex migraine per notes, had CT and MRI -meds she takes that could cause N/V/D: zenpep (reports Dr. Earlean Shawl rxd for digestion), linzess, omeprazole (managed by GI), adderall, wellbutrin(n/d) (managed by psych), topamax (N/D) (managed by neurology), fosamax (N/D, managed by endo) -denies fevers, melena, hematochezia, persistent abd pain, unusual headaches, syncope ROS: See pertinent positives and negatives per HPI.  Past Medical History:  Diagnosis Date  . Arthritis   . Benzodiazepine dependence ([place])   . Carotid arterial disease (HCC)    mild  . Chronic kidney disease (CKD), stage III (moderate) ([place]) 08/01/2018   -seeing  Nephrology, Dr. Posey Pronto, thought from [place] per renal notes   . Chronic pain    managed by pain clinic  . Colon abnormality    didn't work right so part of it was removed at [facility]  . Depression    managed by Dr. Toy Care  . Diastolic heart failure ([place])    mild on echo 2016  . GERD (gastroesophageal reflux disease)   . IBS (irritable bowel syndrome)    sees Dr. Earlean Shawl  . Macular degeneration    goes to [facility] for this  . Migraine    managed  by neurologist  . Osteoporosis   . Vertigo     Past Surgical History:  Procedure Laterality Date  . ABDOMINAL ADHESION SURGERY    . ABDOMINAL HYSTERECTOMY    . APPENDECTOMY    . CHOLECYSTECTOMY    . COLON SURGERY    . CYSTOSCOPY W/ URETERAL STENT PLACEMENT Right 04/27/2016   Procedure: CYSTOSCOPY WITH RETROGRADE PYELOGRAM/ RIGHT URETERAL STENT PLACEMENT;  Surgeon: Ardis Hughs, MD;  Location: [facility];  Service: Urology;  Laterality: Right;  . OVARIAN CYST SURGERY    . SMALL INTESTINE SURGERY      Family History  Problem Relation Age of Onset  . Kidney disease Mother   . Heart disease Mother   . Heart Problems Sister   . Cancer Maternal Aunt        intestinal and liver cancer  . Stroke Maternal Grandmother   . Heart disease Maternal Grandmother   . Stroke Unknown   . Depression Unknown        everyone  . Hypercalcemia Neg Hx     SOCIAL HX: see hpi   Current Outpatient Medications:  .  acetaminophen (TYLENOL) 500 MG tablet, Take 1,000 mg by mouth daily as needed for headache., Disp: , Rfl:  .  alendronate (FOSAMAX) 70 MG tablet, TAKE 1 TABLET BY MOUTH ONCE WEEKLY WITH A FULL GLASS OF WATER ON AN EMPTY STOMACH, Disp: 12 tablet, Rfl: 0 .  amphetamine-dextroamphetamine (ADDERALL) 30 MG  tablet, Take 30 mg by mouth daily., Disp: , Rfl:  .  Biotin 1000 MCG tablet, Take 1,000 mcg by mouth daily., Disp: , Rfl:  .  buPROPion (WELLBUTRIN XL) 150 MG 24 hr tablet, Take 2 tablets daily, Disp: , Rfl:  .  cycloSPORINE (RESTASIS) 0.05 % ophthalmic emulsion, Place 1 drop into both eyes 2 (two) times daily. , Disp: , Rfl:  .  LINZESS 290 MCG CAPS capsule, Take 290 mcg by mouth daily before breakfast. , Disp: , Rfl: 0 .  LORazepam (ATIVAN) 0.5 MG tablet, Take 0.5 mg by mouth 3 (three) times daily as needed for anxiety., Disp: , Rfl:  .  olopatadine (PATANOL) 0.1 % ophthalmic solution, 1 drop 2 (two) times daily., Disp: , Rfl:  .  omeprazole (PRILOSEC) 40 MG capsule, Take 80 mg by mouth  daily. , Disp: , Rfl: 0 .  Probiotic Product (ALIGN PO), Take by mouth daily., Disp: , Rfl:  .  sodium bicarbonate 650 MG tablet, TK 1 T PO BID, Disp: , Rfl: 9 .  SUMAtriptan (IMITREX) 100 MG tablet, Take 100 mg by mouth every 2 (two) hours as needed for migraine. , Disp: , Rfl:  .  topiramate (TOPAMAX) 100 MG tablet, Take 3 tablets at bedtime, Disp: , Rfl:  .  ZENPEP 15000 units CPEP, Take 45,000 mg by mouth 3 (three) times daily before meals. , Disp: , Rfl: 0  EXAM:  Vitals:   08/03/18 0840  BP: 92/60  Pulse: 96  Temp: 97.7 F (36.5 C)  SpO2: 97%    Body mass index is 18.99 kg/m.  GENERAL: vitals reviewed and listed above, alert, oriented, appears well hydrated and in no acute distress  HEENT: atraumatic, conjunttiva clear, no obvious abnormalities on inspection of external nose and ears  NECK: no obvious masses on inspection  LUNGS: clear to auscultation bilaterally, no wheezes, rales or rhonchi, good air movement  CV: HRRR, no peripheral edema  MS: moves all extremities without noticeable abnormality  ABD: BS+, soft, NTTP  PSYCH: pleasant and cooperative, no obvious depression or anxiety  ASSESSMENT AND PLAN:  Discussed the following assessment and plan:  Nausea and vomiting, intractability of vomiting not specified, unspecified vomiting type  Diarrhea, unspecified type  Vaccine counseling  Anxiety and depression - managed at [facility]  Other migraine without status migrainosus, not intractable  Chronic kidney disease (CKD), stage III (moderate) (HCC)  Recurrent major depressive disorder, in partial remission ([place])  -I am glad she is doing better, can advance to regular small meals but did advise advoiding red meat and dairy for a week or two - discussed potential etiologies and could have been migraine, gastroenteritis, recurrent issues that GI has questioned could be from polypharmacy -we have discuss polypharmacy with her a number of times, I don't  prescribe any of her medications -reviewed all of her medications today and the ones that could CAUSE GI symptoms and opted to keep calendar of symptoms, then she could discuss trial reducing with prescribing specialists as she is anxious she will get worse with stopping them -flu shot today per her preference -AWV in 3 months -Patient advised to return or notify a doctor immediately if symptoms worsen or persist or new concerns arise.  Patient Instructions  BEFORE YOU LEAVE: -flu shot -follow up: AWV with Dr. Maudie Mercury in 3 months  Eat small healthy meals. Avoid dairy and red meat for 1-2 weeks. Drink plenty of water.  Keep a calendar of your symptoms (nausea, vomiting, diarrhea) for  three months.  If recurrent symptoms, then work with your specialist to reduce your medications to see if this results in improvement of your symptoms. Several of the Gastro meds, psychiatric medications and the topomax can cause an upset stomach.  I hope you are feeling better soon! Seek care promptly if your symptoms worsen, new concerns arise or you are not improving with treatment.     Lucretia Kern, DO

## 2018-08-03 ENCOUNTER — Ambulatory Visit (INDEPENDENT_AMBULATORY_CARE_PROVIDER_SITE_OTHER): Payer: Medicare Other | Admitting: Family Medicine

## 2018-08-03 ENCOUNTER — Encounter: Payer: Self-pay | Admitting: Family Medicine

## 2018-08-03 VITALS — BP 92/60 | HR 96 | Temp 97.7°F | Ht 61.0 in | Wt 100.5 lb

## 2018-08-03 DIAGNOSIS — Z7189 Other specified counseling: Secondary | ICD-10-CM | POA: Diagnosis not present

## 2018-08-03 DIAGNOSIS — F329 Major depressive disorder, single episode, unspecified: Secondary | ICD-10-CM

## 2018-08-03 DIAGNOSIS — Z7185 Encounter for immunization safety counseling: Secondary | ICD-10-CM

## 2018-08-03 DIAGNOSIS — N183 Chronic kidney disease, stage 3 unspecified: Secondary | ICD-10-CM

## 2018-08-03 DIAGNOSIS — G43809 Other migraine, not intractable, without status migrainosus: Secondary | ICD-10-CM | POA: Diagnosis not present

## 2018-08-03 DIAGNOSIS — F3341 Major depressive disorder, recurrent, in partial remission: Secondary | ICD-10-CM | POA: Diagnosis not present

## 2018-08-03 DIAGNOSIS — Z23 Encounter for immunization: Secondary | ICD-10-CM

## 2018-08-03 DIAGNOSIS — R112 Nausea with vomiting, unspecified: Secondary | ICD-10-CM | POA: Diagnosis not present

## 2018-08-03 DIAGNOSIS — F419 Anxiety disorder, unspecified: Secondary | ICD-10-CM

## 2018-08-03 DIAGNOSIS — R197 Diarrhea, unspecified: Secondary | ICD-10-CM | POA: Diagnosis not present

## 2018-08-03 NOTE — Patient Instructions (Signed)
BEFORE YOU LEAVE: -flu shot -follow up: AWV with Dr. Maudie Mercury in 3 months  Eat small healthy meals. Avoid dairy and red meat for 1-2 weeks. Drink plenty of water.  Keep a calendar of your symptoms (nausea, vomiting, diarrhea) for three months.  If recurrent symptoms, then work with your specialist to reduce your medications to see if this results in improvement of your symptoms. Several of the Gastro meds, psychiatric medications and the topomax can cause an upset stomach.  I hope you are feeling better soon! Seek care promptly if your symptoms worsen, new concerns arise or you are not improving with treatment.

## 2018-08-03 NOTE — Addendum Note (Signed)
Addended by: Agnes Lawrence on: 08/03/2018 09:39 AM   Modules accepted: Orders

## 2018-08-08 ENCOUNTER — Ambulatory Visit
Admission: RE | Admit: 2018-08-08 | Discharge: 2018-08-08 | Disposition: A | Discharge status: Home / Self Care | Payer: Medicare Other | Source: Ambulatory Visit | Attending: Family Medicine | Admitting: Family Medicine

## 2018-08-08 DIAGNOSIS — M810 Age-related osteoporosis without current pathological fracture: Secondary | ICD-10-CM | POA: Diagnosis not present

## 2018-08-08 DIAGNOSIS — Z1239 Encounter for other screening for malignant neoplasm of breast: Secondary | ICD-10-CM

## 2018-08-08 DIAGNOSIS — M8588 Other specified disorders of bone density and structure, other site: Secondary | ICD-10-CM | POA: Diagnosis not present

## 2018-08-08 DIAGNOSIS — Z780 Asymptomatic menopausal state: Secondary | ICD-10-CM | POA: Diagnosis not present

## 2018-08-08 DIAGNOSIS — Z1231 Encounter for screening mammogram for malignant neoplasm of breast: Secondary | ICD-10-CM | POA: Diagnosis not present

## 2018-08-14 DIAGNOSIS — H6983 Other specified disorders of Eustachian tube, bilateral: Secondary | ICD-10-CM | POA: Insufficient documentation

## 2018-08-14 DIAGNOSIS — M26623 Arthralgia of bilateral temporomandibular joint: Secondary | ICD-10-CM | POA: Diagnosis not present

## 2018-08-14 DIAGNOSIS — K120 Recurrent oral aphthae: Secondary | ICD-10-CM | POA: Diagnosis not present

## 2018-08-14 DIAGNOSIS — R040 Epistaxis: Secondary | ICD-10-CM | POA: Insufficient documentation

## 2018-08-14 DIAGNOSIS — H903 Sensorineural hearing loss, bilateral: Secondary | ICD-10-CM | POA: Diagnosis not present

## 2018-08-21 ENCOUNTER — Ambulatory Visit: Payer: Medicare Other | Admitting: Endocrinology

## 2018-09-04 DIAGNOSIS — K5909 Other constipation: Secondary | ICD-10-CM | POA: Diagnosis not present

## 2018-09-04 DIAGNOSIS — K219 Gastro-esophageal reflux disease without esophagitis: Secondary | ICD-10-CM | POA: Diagnosis not present

## 2018-09-04 DIAGNOSIS — R140 Abdominal distension (gaseous): Secondary | ICD-10-CM | POA: Diagnosis not present

## 2018-09-06 ENCOUNTER — Ambulatory Visit (INDEPENDENT_AMBULATORY_CARE_PROVIDER_SITE_OTHER): Payer: Medicare Other | Admitting: Endocrinology

## 2018-09-06 ENCOUNTER — Encounter: Payer: Self-pay | Admitting: Endocrinology

## 2018-09-06 VITALS — BP 114/72 | HR 83 | Ht 61.0 in | Wt 106.2 lb

## 2018-09-06 DIAGNOSIS — M810 Age-related osteoporosis without current pathological fracture: Secondary | ICD-10-CM | POA: Diagnosis not present

## 2018-09-06 LAB — VITAMIN D 25 HYDROXY (VIT D DEFICIENCY, FRACTURES): VITD: 21.43 ng/mL — ABNORMAL LOW (ref 30.00–100.00)

## 2018-09-06 NOTE — Progress Notes (Signed)
Subjective:    Patient ID: Danielle Gaines, female    DOB: August 25, 1941, 77 y.o.   MRN: [id number]  HPI  Pt returns for f/u of osteoporosis: Dx'ed: 2004 Secondary cause: early menopause (age 35) Fractures: left face (2007), and coccyx (1999), both with falls.   Past rx: none Current rx: fosamax (since 2014) Last DEXA result: -2.8, at the [facility] (2019).  Other: she was seen in 2016 for hypercalcemia, and vit-D was found to be high; she also had urolithiasis in 2016.   Interval hx: she does not take a vitamin-d supplement.  pt states she feels well in general.   Past Medical History:  Diagnosis Date  . Arthritis   . Benzodiazepine dependence ([place])   . Carotid arterial disease (HCC)    mild  . Chronic kidney disease (CKD), stage III (moderate) ([place]) 08/01/2018   -seeing  Nephrology, Dr. Posey Pronto, thought from [place] per renal notes   . Chronic pain    managed by pain clinic  . Colon abnormality    didn't work right so part of it was removed at [facility]  . Depression    managed by Dr. Toy Care  . Diastolic heart failure ([place])    mild on echo 2016  . GERD (gastroesophageal reflux disease)   . IBS (irritable bowel syndrome)    sees Dr. Earlean Shawl  . Macular degeneration    goes to [facility] for this  . Migraine    managed by neurologist  . Osteoporosis   . Vertigo     Past Surgical History:  Procedure Laterality Date  . ABDOMINAL ADHESION SURGERY    . ABDOMINAL HYSTERECTOMY    . APPENDECTOMY    . CHOLECYSTECTOMY    . COLON SURGERY    . CYSTOSCOPY W/ URETERAL STENT PLACEMENT Right 04/27/2016   Procedure: CYSTOSCOPY WITH RETROGRADE PYELOGRAM/ RIGHT URETERAL STENT PLACEMENT;  Surgeon: Ardis Hughs, MD;  Location: [facility];  Service: Urology;  Laterality: Right;  . OVARIAN CYST SURGERY    . SMALL INTESTINE SURGERY      Social History   Socioeconomic History  . Marital status: Married    Spouse name: Not on file  . Number of children: Not on file  . Years of education: Not on file    . Highest education level: Not on file  Occupational History  . Not on file  Social Needs  . Financial resource strain: Not on file  . Food insecurity:    Worry: Not on file    Inability: Not on file  . Transportation needs:    Medical: Not on file    Non-medical: Not on file  Tobacco Use  . Smoking status: Never Smoker  . Smokeless tobacco: Never Used  Substance and Sexual Activity  . Alcohol use: No  . Drug use: No  . Sexual activity: Never  Lifestyle  . Physical activity:    Days per week: Not on file    Minutes per session: Not on file  . Stress: Not on file  Relationships  . Social connections:    Talks on phone: Not on file    Gets together: Not on file    Attends religious service: Not on file    Active member of club or organization: Not on file    Attends meetings of clubs or organizations: Not on file    Relationship status: Not on file  . Intimate partner violence:    Fear of current or ex partner: Not on file  Emotionally abused: Not on file    Physically abused: Not on file    Forced sexual activity: Not on file  Other Topics Concern  . Not on file  Social History Narrative   Work or School: none      Home Situation: lives with husband      Spiritual Beliefs:       Lifestyle: no regular exercise; diet is ok          Current Outpatient Medications on File Prior to Visit  Medication Sig Dispense Refill  . acetaminophen (TYLENOL) 500 MG tablet Take 1,000 mg by mouth daily as needed for headache.    . alendronate (FOSAMAX) 70 MG tablet TAKE 1 TABLET BY MOUTH ONCE WEEKLY WITH A FULL GLASS OF WATER ON AN EMPTY STOMACH 12 tablet 0  . amphetamine-dextroamphetamine (ADDERALL) 30 MG tablet Take 30 mg by mouth daily.    . Biotin 1000 MCG tablet Take 1,000 mcg by mouth daily.    . citalopram (CELEXA) 20 MG tablet TK 1 T PO D  2  . cycloSPORINE (RESTASIS) 0.05 % ophthalmic emulsion Place 1 drop into both eyes 2 (two) times daily.     Marland Kitchen LINZESS 290 MCG  CAPS capsule Take 290 mcg by mouth daily before breakfast.   0  . LORazepam (ATIVAN) 0.5 MG tablet Take 0.5 mg by mouth 3 (three) times daily as needed for anxiety.    Marland Kitchen olopatadine (PATANOL) 0.1 % ophthalmic solution 1 drop 2 (two) times daily.    Marland Kitchen omeprazole (PRILOSEC) 40 MG capsule Take 80 mg by mouth daily.   0  . Probiotic Product (ALIGN PO) Take by mouth daily.    . sodium bicarbonate 650 MG tablet TK 1 T PO BID  9  . SUMAtriptan (IMITREX) 100 MG tablet Take 100 mg by mouth every 2 (two) hours as needed for migraine.     . topiramate (TOPAMAX) 100 MG tablet Take 3 tablets at bedtime    . ZENPEP 15000 units CPEP Take 45,000 mg by mouth 2 (two) times daily.   0  . buPROPion (WELLBUTRIN XL) 150 MG 24 hr tablet Take 2 tablets daily     No current facility-administered medications on file prior to visit.     Allergies  Allergen Reactions  . Sulfa Antibiotics Swelling and Other (See Comments)    Reaction:  Unspecified swelling reaction     Family History  Problem Relation Age of Onset  . Kidney disease Mother   . Heart disease Mother   . Osteoporosis Mother   . Heart Problems Sister   . Cancer Maternal Aunt        intestinal and liver cancer  . Stroke Maternal Grandmother   . Heart disease Maternal Grandmother   . Stroke Unknown   . Depression Unknown        everyone  . Hypercalcemia Neg Hx   . Breast cancer Neg Hx     BP 114/72 (BP Location: Right Arm, Patient Position: Sitting)   Pulse 83   Ht 5\' 1"  (1.549 m)   Wt 106 lb 3.2 oz (48.2 kg)   SpO2 97%   BMI 20.07 kg/m    Review of Systems No recent falls.     Objective:   Physical Exam VITAL SIGNS:  See vs page GENERAL: no distress CHEST WALL: no kyphosis GAIT: normal and steady  Lab Results  Component Value Date   PTH 23 10/31/2015   CALCIUM 9.4 06/29/2018   CAION 1.30  06/29/2018   Lab Results  Component Value Date   CREATININE 1.50 (H) 06/29/2018   BUN 26 (H) 06/29/2018   NA 144 06/29/2018   K 3.8  06/29/2018   CL 115 (H) 06/29/2018   CO2 21 (L) 06/29/2018   Lab Results  Component Value Date   TSH 3.47 06/29/2018    BMD measured at Femur Neck Left is 0.654 g/cm2 with a T-score of -2.8.    Assessment & Plan:  Osteoporosis: not improved on fosamax Macular degeneration: she has fall risk  Patient Instructions  blood tests are requested for you today.  We'll let you know about the results. If these tests do not explain the osteoporosis, we would request for you a twice a year shot called "Prolia."  It is critically important to prevent falling down (keep floor areas well-lit, dry, and free of loose objects.  If you have a cane, walker, or wheelchair, you should use it, even for short trips around the house.  Wear flat-soled shoes.  Also, try not to rush).    Osteoporosis Osteoporosis is the thinning and loss of density in the bones. Osteoporosis makes the bones more brittle, fragile, and likely to break (fracture). Over time, osteoporosis can cause the bones to become so weak that they fracture after a simple fall. The bones most likely to fracture are the bones in the hip, wrist, and spine. What are the causes? The exact cause is not known. What increases the risk? Anyone can develop osteoporosis. You may be at greater risk if you have a family history of the condition or have poor nutrition. You may also have a higher risk if you are:  Female.  99 years old or older.  A smoker.  Not physically active.  White or Asian.  Slender.  What are the signs or symptoms? A fracture might be the first sign of the disease, especially if it results from a fall or injury that would not usually cause a bone to break. Other signs and symptoms include:  Low back and neck pain.  Stooped posture.  Height loss.  How is this diagnosed? To make a diagnosis, your health care provider may:  Take a medical history.  Perform a physical exam.  Order tests, such as: ? A bone mineral  density test. ? A dual-energy X-ray absorptiometry test.  How is this treated? The goal of osteoporosis treatment is to strengthen your bones to reduce your risk of a fracture. Treatment may involve:  Making lifestyle changes, such as: ? Eating a diet rich in calcium. ? Doing weight-bearing and muscle-strengthening exercises. ? Stopping tobacco use. ? Limiting alcohol intake.  Taking medicine to slow the process of bone loss or to increase bone density.  Monitoring your levels of calcium and vitamin D.  Follow these instructions at home:  Include calcium and vitamin D in your diet. Calcium is important for bone health, and vitamin D helps the body absorb calcium.  Perform weight-bearing and muscle-strengthening exercises as directed by your health care provider.  Do not use any tobacco products, including cigarettes, chewing tobacco, and electronic cigarettes. If you need help quitting, ask your health care provider.  Limit your alcohol intake.  Take medicines only as directed by your health care provider.  Keep all follow-up visits as directed by your health care provider. This is important.  Take precautions at home to lower your risk of falling, such as: ? Keeping rooms well lit and clutter free. ? Installing safety rails  on stairs. ? Using rubber mats in the bathroom and other areas that are often wet or slippery. Get help right away if: You fall or injure yourself. This information is not intended to replace advice given to you by your health care provider. Make sure you discuss any questions you have with your health care provider. Document Released: 08/25/2005 Document Revised: 04/19/2016 Document Reviewed: 04/25/2014 Elsevier Interactive Patient Education  [employer].

## 2018-09-06 NOTE — Patient Instructions (Addendum)
blood tests are requested for you today.  We'll let you know about the results. If these tests do not explain the osteoporosis, we would request for you a twice a year shot called "Prolia."  It is critically important to prevent falling down (keep floor areas well-lit, dry, and free of loose objects.  If you have a cane, walker, or wheelchair, you should use it, even for short trips around the house.  Wear flat-soled shoes.  Also, try not to rush).    Osteoporosis Osteoporosis is the thinning and loss of density in the bones. Osteoporosis makes the bones more brittle, fragile, and likely to break (fracture). Over time, osteoporosis can cause the bones to become so weak that they fracture after a simple fall. The bones most likely to fracture are the bones in the hip, wrist, and spine. What are the causes? The exact cause is not known. What increases the risk? Anyone can develop osteoporosis. You may be at greater risk if you have a family history of the condition or have poor nutrition. You may also have a higher risk if you are:  Female.  60 years old or older.  A smoker.  Not physically active.  White or Asian.  Slender.  What are the signs or symptoms? A fracture might be the first sign of the disease, especially if it results from a fall or injury that would not usually cause a bone to break. Other signs and symptoms include:  Low back and neck pain.  Stooped posture.  Height loss.  How is this diagnosed? To make a diagnosis, your health care provider may:  Take a medical history.  Perform a physical exam.  Order tests, such as: ? A bone mineral density test. ? A dual-energy X-ray absorptiometry test.  How is this treated? The goal of osteoporosis treatment is to strengthen your bones to reduce your risk of a fracture. Treatment may involve:  Making lifestyle changes, such as: ? Eating a diet rich in calcium. ? Doing weight-bearing and muscle-strengthening  exercises. ? Stopping tobacco use. ? Limiting alcohol intake.  Taking medicine to slow the process of bone loss or to increase bone density.  Monitoring your levels of calcium and vitamin D.  Follow these instructions at home:  Include calcium and vitamin D in your diet. Calcium is important for bone health, and vitamin D helps the body absorb calcium.  Perform weight-bearing and muscle-strengthening exercises as directed by your health care provider.  Do not use any tobacco products, including cigarettes, chewing tobacco, and electronic cigarettes. If you need help quitting, ask your health care provider.  Limit your alcohol intake.  Take medicines only as directed by your health care provider.  Keep all follow-up visits as directed by your health care provider. This is important.  Take precautions at home to lower your risk of falling, such as: ? Keeping rooms well lit and clutter free. ? Installing safety rails on stairs. ? Using rubber mats in the bathroom and other areas that are often wet or slippery. Get help right away if: You fall or injure yourself. This information is not intended to replace advice given to you by your health care provider. Make sure you discuss any questions you have with your health care provider. Document Released: 08/25/2005 Document Revised: 04/19/2016 Document Reviewed: 04/25/2014 Elsevier Interactive Patient Education  [employer].

## 2018-09-09 LAB — PTH, INTACT AND CALCIUM
Calcium: 9.2 mg/dL (ref 8.6–10.4)
PTH: 56 pg/mL (ref 14–64)

## 2018-09-09 LAB — VITAMIN D 1,25 DIHYDROXY
Vitamin D 1, 25 (OH)2 Total: 42 pg/mL (ref 18–72)
Vitamin D2 1, 25 (OH)2: <8 pg/mL
Vitamin D3 1, 25 (OH)2: 42 pg/mL

## 2018-09-11 ENCOUNTER — Telehealth: Payer: Self-pay

## 2018-09-11 ENCOUNTER — Other Ambulatory Visit: Payer: Self-pay

## 2018-09-11 NOTE — Telephone Encounter (Signed)
LVM requesting patient call back to schedule nurse visit for Prolia injection-owes $0 out-of-pocket and is ready to be scheduled

## 2018-09-12 ENCOUNTER — Ambulatory Visit: Payer: Medicare Other

## 2018-09-13 ENCOUNTER — Ambulatory Visit (INDEPENDENT_AMBULATORY_CARE_PROVIDER_SITE_OTHER): Payer: Medicare Other

## 2018-09-13 DIAGNOSIS — M810 Age-related osteoporosis without current pathological fracture: Secondary | ICD-10-CM | POA: Diagnosis not present

## 2018-09-13 DIAGNOSIS — G43709 Chronic migraine without aura, not intractable, without status migrainosus: Secondary | ICD-10-CM | POA: Diagnosis not present

## 2018-09-13 MED ORDER — DENOSUMAB 60 MG/ML ~~LOC~~ SOSY
60.0000 mg | PREFILLED_SYRINGE | Freq: Once | SUBCUTANEOUS | Status: AC
  Administered 2018-09-13: 60 mg via SUBCUTANEOUS

## 2018-09-13 NOTE — Progress Notes (Signed)
Per orders of Dr.Ellison injection of prolia 60 mg/mL given today by Lolita Rieger RMA . Patient tolerated injection well.

## 2018-09-19 DIAGNOSIS — H44113 Panuveitis, bilateral: Secondary | ICD-10-CM | POA: Diagnosis not present

## 2018-09-19 DIAGNOSIS — H3581 Retinal edema: Secondary | ICD-10-CM | POA: Diagnosis not present

## 2018-09-20 DIAGNOSIS — R030 Elevated blood-pressure reading, without diagnosis of hypertension: Secondary | ICD-10-CM | POA: Diagnosis not present

## 2018-09-20 DIAGNOSIS — M545 Low back pain: Secondary | ICD-10-CM | POA: Diagnosis not present

## 2018-09-20 DIAGNOSIS — M797 Fibromyalgia: Secondary | ICD-10-CM | POA: Diagnosis not present

## 2018-09-20 DIAGNOSIS — M47812 Spondylosis without myelopathy or radiculopathy, cervical region: Secondary | ICD-10-CM | POA: Diagnosis not present

## 2018-10-18 ENCOUNTER — Other Ambulatory Visit: Payer: Self-pay

## 2018-10-18 ENCOUNTER — Ambulatory Visit (INDEPENDENT_AMBULATORY_CARE_PROVIDER_SITE_OTHER): Payer: Medicare Other | Admitting: Obstetrics and Gynecology

## 2018-10-18 ENCOUNTER — Encounter: Payer: Self-pay | Admitting: Obstetrics and Gynecology

## 2018-10-18 VITALS — BP 110/72 | HR 84 | Ht 61.0 in | Wt 109.2 lb

## 2018-10-18 DIAGNOSIS — R232 Flushing: Secondary | ICD-10-CM

## 2018-10-18 DIAGNOSIS — R21 Rash and other nonspecific skin eruption: Secondary | ICD-10-CM | POA: Diagnosis not present

## 2018-10-18 DIAGNOSIS — N898 Other specified noninflammatory disorders of vagina: Secondary | ICD-10-CM

## 2018-10-18 DIAGNOSIS — N952 Postmenopausal atrophic vaginitis: Secondary | ICD-10-CM

## 2018-10-18 MED ORDER — ESTRADIOL 10 MCG VA TABS: ORAL_TABLET | VAGINAL | 3 refills | Status: DC

## 2018-10-18 NOTE — Addendum Note (Signed)
Addended by: Gwendlyn Deutscher on: 10/18/2018 01:42 PM   Modules accepted: Orders

## 2018-10-18 NOTE — Progress Notes (Signed)
GYNECOLOGY  VISIT   HPI: 77 y.o.   Married White or Caucasian Not Hispanic or Latino  female   [id number] with No LMP recorded. Patient has had a hysterectomy.   here for 3 weeks began having vaginal odor. Used OTC Monistat. Started having bleeding for a few days after using the applicator. Using applicator was painful. So uncomfortable, started bleeding from her vagina. Stopped bleeding after a few days, it hurt to put the applicator in, bleeding stopped a few days after stopping the monistat. Normal voiding. Urine is very yellow. No dysuria, no change in frequency or urgency.   She c/o a rash on her lower legs, not itchy or irritated, started about a week after getting her prolia (last month). She also c/o new onset of hot flashes since starting her prolia, hasn't had any in 7 years.   GYNECOLOGIC HISTORY: No LMP recorded. Patient has had a hysterectomy. Contraception: Postmenopausal, hysterectomy Menopausal hormone therapy: None       OB History    Gravida  1   Para  1   Term  0   Preterm  0   AB  0   Living  1     SAB  0   TAB  0   Ectopic  0   Multiple  0   Live Births  0              Patient Active Problem List   Diagnosis Date Noted  . Chronic kidney disease (CKD), stage III (moderate) ([place]) 08/01/2018  . Anemia, chronic renal failure 08/01/2018  . Chronic pain 11/19/2016  . Orthostatic hypotension 11/19/2016  . MDD (major depressive disorder), recurrent severe, without psychosis ([place]) 04/09/2015  . Osteoporosis 07/24/2014  . Migraine - followed by Dr. Sima Matas in Neurology 05/24/2014  . Anxiety and depression - managed at [facility] 05/24/2014  . GERD (gastroesophageal reflux disease) 05/24/2014  . Macular degeneration - goes to [facility] for this 05/24/2014    Past Medical History:  Diagnosis Date  . Arthritis   . Benzodiazepine dependence ([place])   . Carotid arterial disease (HCC)    mild  . Chronic kidney disease (CKD), stage III (moderate) ([place])  08/01/2018   -seeing  Nephrology, Dr. Posey Pronto, thought from [place] per renal notes   . Chronic pain    managed by pain clinic  . Colon abnormality    didn't work right so part of it was removed at [facility]  . Depression    managed by Dr. Toy Care  . Diastolic heart failure ([place])    mild on echo 2016  . GERD (gastroesophageal reflux disease)   . IBS (irritable bowel syndrome)    sees Dr. Earlean Shawl  . Macular degeneration    goes to [facility] for this  . Migraine    managed by neurologist  . Osteoporosis   . Vertigo     Past Surgical History:  Procedure Laterality Date  . ABDOMINAL ADHESION SURGERY    . ABDOMINAL HYSTERECTOMY    . APPENDECTOMY    . CHOLECYSTECTOMY    . COLON SURGERY    . CYSTOSCOPY W/ URETERAL STENT PLACEMENT Right 04/27/2016   Procedure: CYSTOSCOPY WITH RETROGRADE PYELOGRAM/ RIGHT URETERAL STENT PLACEMENT;  Surgeon: Ardis Hughs, MD;  Location: [facility];  Service: Urology;  Laterality: Right;  . OVARIAN CYST SURGERY    . SMALL INTESTINE SURGERY      Current Outpatient Medications  Medication Sig Dispense Refill  . acetaminophen (TYLENOL) 500 MG tablet  Take 1,000 mg by mouth daily as needed for headache.    . amphetamine-dextroamphetamine (ADDERALL) 30 MG tablet Take 30 mg by mouth daily.    . Biotin 1000 MCG tablet Take 1,000 mcg by mouth daily.    . citalopram (CELEXA) 20 MG tablet TK 1 T PO D  2  . cycloSPORINE (RESTASIS) 0.05 % ophthalmic emulsion Place 1 drop into both eyes 2 (two) times daily.     Marland Kitchen LINZESS 290 MCG CAPS capsule Take 290 mcg by mouth daily before breakfast.   0  . LORazepam (ATIVAN) 0.5 MG tablet Take 0.5 mg by mouth 3 (three) times daily as needed for anxiety.    Marland Kitchen olopatadine (PATANOL) 0.1 % ophthalmic solution 1 drop 2 (two) times daily.    Marland Kitchen omeprazole (PRILOSEC) 40 MG capsule Take 80 mg by mouth daily.   0  . Probiotic Product (ALIGN PO) Take by mouth daily.    . sodium bicarbonate 650 MG tablet TK 1 T PO BID  9  . SUMAtriptan (IMITREX)  100 MG tablet Take 100 mg by mouth every 2 (two) hours as needed for migraine.     . topiramate (TOPAMAX) 100 MG tablet Take 3 tablets at bedtime    . ZENPEP 15000 units CPEP Take 45,000 mg by mouth 2 (two) times daily.   0  . alendronate (FOSAMAX) 70 MG tablet TAKE 1 TABLET BY MOUTH ONCE WEEKLY WITH A FULL GLASS OF WATER ON AN EMPTY STOMACH (Patient not taking: Reported on 10/18/2018) 12 tablet 0  . buPROPion (WELLBUTRIN XL) 150 MG 24 hr tablet Take 2 tablets daily     No current facility-administered medications for this visit.      ALLERGIES: Sulfa antibiotics  Family History  Problem Relation Age of Onset  . Kidney disease Mother   . Heart disease Mother   . Osteoporosis Mother   . Heart Problems Sister   . Cancer Maternal Aunt        intestinal and liver cancer  . Stroke Maternal Grandmother   . Heart disease Maternal Grandmother   . Stroke Unknown   . Depression Unknown        everyone  . Hypercalcemia Neg Hx   . Breast cancer Neg Hx     Social History   Socioeconomic History  . Marital status: Married    Spouse name: Not on file  . Number of children: Not on file  . Years of education: Not on file  . Highest education level: Not on file  Occupational History  . Not on file  Social Needs  . Financial resource strain: Not on file  . Food insecurity:    Worry: Not on file    Inability: Not on file  . Transportation needs:    Medical: Not on file    Non-medical: Not on file  Tobacco Use  . Smoking status: Never Smoker  . Smokeless tobacco: Never Used  Substance and Sexual Activity  . Alcohol use: No  . Drug use: No  . Sexual activity: Not Currently    Birth control/protection: Post-menopausal  Lifestyle  . Physical activity:    Days per week: Not on file    Minutes per session: Not on file  . Stress: Not on file  Relationships  . Social connections:    Talks on phone: Not on file    Gets together: Not on file    Attends religious service: Not on file     Active member of club  or organization: Not on file    Attends meetings of clubs or organizations: Not on file    Relationship status: Not on file  . Intimate partner violence:    Fear of current or ex partner: Not on file    Emotionally abused: Not on file    Physically abused: Not on file    Forced sexual activity: Not on file  Other Topics Concern  . Not on file  Social History Narrative   Work or School: none      Home Situation: lives with husband      Spiritual Beliefs:       Lifestyle: no regular exercise; diet is ok          Review of Systems  Constitutional: Negative.   HENT: Positive for hearing loss, nosebleeds and sinus pain.   Eyes: Negative.   Respiratory: Negative.   Cardiovascular: Negative.   Gastrointestinal: Positive for abdominal pain.       Bloating  Genitourinary:       Vaginal odor Vaginal itching Hot flashes Night sweats  Musculoskeletal: Negative.   Skin: Positive for itching and rash.  Neurological: Positive for headaches.  Endo/Heme/Allergies: Bruises/bleeds easily.       Excessive thirst Heat/cold intolerance  Psychiatric/Behavioral: Negative.     PHYSICAL EXAMINATION:    BP 110/72 (BP Location: Right Arm, Patient Position: Sitting, Cuff Size: Normal)   Pulse 84   Ht 5\' 1"  (1.549 m)   Wt 109 lb 3.2 oz (49.5 kg)   BMI 20.63 kg/m     General appearance: alert, cooperative and appears stated age Abdomen: soft, non-tender; non distended, no masses,  no organomegaly Lower extremities: erythematous papular rash, diffuse bilateral calves.   Pelvic: External genitalia:  no lesions, atrophic              Urethra:  normal appearing urethra with no masses, tenderness or lesions, some erythema, suspect trauma from monistat applicator              Bartholins and Skenes: normal                 Vagina: atrophic appearing vagina with normal color and discharge, no lesions              Cervix: absent              Bimanual Exam:  Uterus:   uterus absent              Adnexa: no mass, fullness, tenderness                Chaperone was present for exam.  ASSESSMENT Severe vaginal atrophy, no infection seen on vaginal slides Vaginal odor and irritation, likely from the atrophy New onset hot flashes Rash after starting prolia    PLAN Affirm sent Treat with vaginal estrogen F/U with Dr Maudie Mercury for further evaluation of her new hot flashes Reassured the patient that a self limited rash is common with Prolia, if it doesn't resolve she should f/u with her primary   An After Visit Summary was printed and given to the patient.

## 2018-10-19 LAB — VAGINITIS/VAGINOSIS, DNA PROBE
Candida Species: NEGATIVE
Gardnerella vaginalis: NEGATIVE
Trichomonas vaginosis: NEGATIVE

## 2018-10-25 DIAGNOSIS — M47812 Spondylosis without myelopathy or radiculopathy, cervical region: Secondary | ICD-10-CM | POA: Diagnosis not present

## 2018-10-25 DIAGNOSIS — M5413 Radiculopathy, cervicothoracic region: Secondary | ICD-10-CM | POA: Diagnosis not present

## 2018-10-31 DIAGNOSIS — M533 Sacrococcygeal disorders, not elsewhere classified: Secondary | ICD-10-CM | POA: Diagnosis not present

## 2018-11-02 ENCOUNTER — Ambulatory Visit: Payer: Medicare Other | Admitting: Family Medicine

## 2018-11-06 ENCOUNTER — Ambulatory Visit: Payer: Medicare Other | Admitting: Family Medicine

## 2018-12-21 ENCOUNTER — Encounter: Payer: Self-pay | Admitting: Family Medicine

## 2018-12-21 ENCOUNTER — Ambulatory Visit (INDEPENDENT_AMBULATORY_CARE_PROVIDER_SITE_OTHER): Payer: Medicare Other | Admitting: Family Medicine

## 2018-12-21 VITALS — BP 100/70 | HR 89 | Temp 97.8°F | Ht 61.0 in | Wt 113.4 lb

## 2018-12-21 DIAGNOSIS — N950 Postmenopausal bleeding: Secondary | ICD-10-CM

## 2018-12-21 DIAGNOSIS — R631 Polydipsia: Secondary | ICD-10-CM

## 2018-12-21 DIAGNOSIS — R635 Abnormal weight gain: Secondary | ICD-10-CM

## 2018-12-21 DIAGNOSIS — N183 Chronic kidney disease, stage 3 unspecified: Secondary | ICD-10-CM

## 2018-12-21 DIAGNOSIS — G43809 Other migraine, not intractable, without status migrainosus: Secondary | ICD-10-CM | POA: Diagnosis not present

## 2018-12-21 DIAGNOSIS — N189 Chronic kidney disease, unspecified: Secondary | ICD-10-CM | POA: Diagnosis not present

## 2018-12-21 DIAGNOSIS — F419 Anxiety disorder, unspecified: Secondary | ICD-10-CM | POA: Diagnosis not present

## 2018-12-21 DIAGNOSIS — F332 Major depressive disorder, recurrent severe without psychotic features: Secondary | ICD-10-CM | POA: Diagnosis not present

## 2018-12-21 DIAGNOSIS — D631 Anemia in chronic kidney disease: Secondary | ICD-10-CM | POA: Diagnosis not present

## 2018-12-21 DIAGNOSIS — D7589 Other specified diseases of blood and blood-forming organs: Secondary | ICD-10-CM | POA: Diagnosis not present

## 2018-12-21 DIAGNOSIS — F329 Major depressive disorder, single episode, unspecified: Secondary | ICD-10-CM | POA: Diagnosis not present

## 2018-12-21 NOTE — Patient Instructions (Signed)
BEFORE YOU LEAVE: -labs -follow up: AWV with health coach and follow up with Dr. Maudie Mercury in 2 months  Please use aquaphor twice daily. Take short and cooler showers. Apply the steroid cream once daily to rash. Call the dermatologist for appointment.  Let your endocrinologist and your other specialists know about your symptoms.  I hope you are feeling better soon! Seek care promptly if your symptoms worsen, new concerns arise or you are not improving with treatment.

## 2018-12-21 NOTE — Progress Notes (Signed)
HPI:  Using dictation device. Unfortunately this device frequently misinterprets words/phrases.  Acute visit for several concerns. She had had several changes in medications with her specialist including increase adderall, wellbutrin stopped and now on prolia. She also was on estradiol for some pmb after evaluation with her gynecologist. Reports that thru her for a loop and she felt terrible on it so is not longer taking and bleeding resolved. Her concerns are increased appetite, small amount of wt gain, increased thirst and persistence of her chronic nausea and fatigue. She is worried about her thyroid and reports her gynecologist did not check her thyroid level.   ROS: See pertinent positives and negatives per HPI.  Past Medical History:  Diagnosis Date  . Arthritis   . Benzodiazepine dependence ([place])   . Carotid arterial disease (HCC)    mild  . Chronic kidney disease (CKD), stage III (moderate) ([place]) 08/01/2018   -seeing  Nephrology, Dr. Posey Pronto, thought from [place] per renal notes   . Chronic pain    managed by pain clinic  . Colon abnormality    didn't work right so part of it was removed at [facility]  . Depression    managed by Dr. Toy Care  . Diastolic heart failure ([place])    mild on echo 2016  . GERD (gastroesophageal reflux disease)   . IBS (irritable bowel syndrome)    sees Dr. Earlean Shawl  . Macular degeneration    goes to [facility] for this  . Migraine    managed by neurologist  . Osteoporosis   . Vertigo     Past Surgical History:  Procedure Laterality Date  . ABDOMINAL ADHESION SURGERY    . ABDOMINAL HYSTERECTOMY    . APPENDECTOMY    . CHOLECYSTECTOMY    . COLON SURGERY    . CYSTOSCOPY W/ URETERAL STENT PLACEMENT Right 04/27/2016   Procedure: CYSTOSCOPY WITH RETROGRADE PYELOGRAM/ RIGHT URETERAL STENT PLACEMENT;  Surgeon: Ardis Hughs, MD;  [facility];  Service: Urology;  Laterality: Right;  . OVARIAN CYST SURGERY    . SMALL INTESTINE SURGERY      Family  History  Problem Relation Age of Onset  . Kidney disease Mother   . Heart disease Mother   . Osteoporosis Mother   . Heart Problems Sister   . Cancer Maternal Aunt        intestinal and liver cancer  . Stroke Maternal Grandmother   . Heart disease Maternal Grandmother   . Stroke Unknown   . Depression Unknown        everyone  . Hypercalcemia Neg Hx   . Breast cancer Neg Hx     SOCIAL HX: see hpi   Current Outpatient Medications:  .  acetaminophen (TYLENOL) 500 MG tablet, Take 1,000 mg by mouth daily as needed for headache., Disp: , Rfl:  .  amphetamine-dextroamphetamine (ADDERALL XR) 20 MG 24 hr capsule, Take 40 mg by mouth. Every morning, Disp: , Rfl:  .  amphetamine-dextroamphetamine (ADDERALL) 30 MG tablet, Take 30 mg by mouth daily., Disp: , Rfl:  .  Biotin 1000 MCG tablet, Take 1,000 mcg by mouth daily., Disp: , Rfl:  .  Calcium Carbonate (CALTRATE 600 PO), Take by mouth daily., Disp: , Rfl:  .  citalopram (CELEXA) 20 MG tablet, TK 1 T PO D, Disp: , Rfl: 2 .  cycloSPORINE (RESTASIS) 0.05 % ophthalmic emulsion, Place 1 drop into both eyes 2 (two) times daily. , Disp: , Rfl:  .  denosumab (PROLIA) 60 MG/ML  SOSY injection, Inject 60 mg into the skin every 6 (six) months., Disp: , Rfl:  .  Estradiol 10 MCG TABS vaginal tablet, Place one tablet vaginally qhs x 1 week, then 2 x a week at hs., Disp: 24 tablet, Rfl: 3 .  LINZESS 290 MCG CAPS capsule, Take 290 mcg by mouth daily before breakfast. , Disp: , Rfl: 0 .  LORazepam (ATIVAN) 0.5 MG tablet, Take 0.5 mg by mouth 3 (three) times daily as needed for anxiety., Disp: , Rfl:  .  olopatadine (PATANOL) 0.1 % ophthalmic solution, 1 drop 2 (two) times daily., Disp: , Rfl:  .  omeprazole (PRILOSEC) 40 MG capsule, Take 80 mg by mouth daily. , Disp: , Rfl: 0 .  Probiotic Product (ALIGN PO), Take by mouth daily., Disp: , Rfl:  .  sodium bicarbonate 650 MG tablet, TK 1 T PO BID, Disp: , Rfl: 9 .  SUMAtriptan (IMITREX) 100 MG tablet, Take  100 mg by mouth every 2 (two) hours as needed for migraine. , Disp: , Rfl:  .  topiramate (TOPAMAX) 100 MG tablet, Take 3 tablets at bedtime, Disp: , Rfl:  .  VITAMIN D PO, Take by mouth daily., Disp: , Rfl:  .  ZENPEP 15000 units CPEP, Take 45,000 mg by mouth 2 (two) times daily. , Disp: , Rfl: 0  EXAM:  Vitals:   12/21/18 1506  BP: 100/70  Pulse: 89  Temp: 97.8 F (36.6 C)  SpO2: 98%    Body mass index is 21.43 kg/m.  GENERAL: vitals reviewed and listed above, alert, oriented, appears well hydrated and in no acute distress  HEENT: atraumatic, conjunttiva clear, no obvious abnormalities on inspection of external nose and ears  NECK: no obvious masses on inspection  LUNGS: clear to auscultation bilaterally, no wheezes, rales or rhonchi, good air movement  CV: HRRR, no peripheral edema  MS: moves all extremities without noticeable abnormality  PSYCH: pleasant and cooperative, no obvious depression or anxiety  ASSESSMENT AND PLAN:  Discussed the following assessment and plan:  Weight gain - Plan: TSH  Post-menopausal bleeding - Plan: TSH  Increased thirst - Plan: Basic metabolic panel  Anxiety and depression - managed at [facility]  Other migraine without status migrainosus, not intractable  MDD (major depressive disorder), recurrent severe, without psychosis ([place])  Chronic kidney disease (CKD), stage III (moderate) (HCC)  Anemia of chronic renal failure, unspecified CKD stage - Plan: CBC  -will get some basic labs per orders for her concerns and prior hx -advised again of polypharmacy and meds and interactions - managed by her specialist and advised to work with her specialists to reduce -follow up and awv in 2 months or sooner as needed -Patient advised to return or notify a doctor immediately if symptoms worsen or persist or new concerns arise.  Patient Instructions  BEFORE YOU LEAVE: -labs -follow up: AWV with health coach and follow up with Dr. Maudie Mercury in 2  months  Please use aquaphor twice daily. Take short and cooler showers. Apply the steroid cream once daily to rash. Call the dermatologist for appointment.  Let your endocrinologist and your other specialists know about your symptoms.  I hope you are feeling better soon! Seek care promptly if your symptoms worsen, new concerns arise or you are not improving with treatment.      Lucretia Kern, DO

## 2018-12-22 LAB — BASIC METABOLIC PANEL
BUN: 28 mg/dL — ABNORMAL HIGH (ref 6–23)
CO2: 19 mEq/L (ref 19–32)
Calcium: 10 mg/dL (ref 8.4–10.5)
Chloride: 110 mEq/L (ref 96–112)
Creatinine, Ser: 1.11 mg/dL (ref 0.40–1.20)
GFR: 47.87 mL/min — ABNORMAL LOW (ref >60.00)
Glucose, Bld: 93 mg/dL (ref 70–99)
Potassium: 3.7 mEq/L (ref 3.5–5.1)
Sodium: 139 mEq/L (ref 135–145)

## 2018-12-22 LAB — CBC
HCT: 46.9 % — ABNORMAL HIGH (ref 36.0–46.0)
Hemoglobin: 15.6 g/dL — ABNORMAL HIGH (ref 12.0–15.0)
MCHC: 33.3 g/dL (ref 30.0–36.0)
MCV: 101.3 fl — ABNORMAL HIGH (ref 78.0–100.0)
Platelets: 241 10*3/uL (ref 150.0–400.0)
RBC: 4.63 Mil/uL (ref 3.87–5.11)
RDW: 13.1 % (ref 11.5–15.5)
WBC: 7 10*3/uL (ref 4.0–10.5)

## 2018-12-22 LAB — TSH: TSH: 1.79 u[IU]/mL (ref 0.35–4.50)

## 2018-12-26 NOTE — Addendum Note (Signed)
Addended by: Agnes Lawrence on: 12/26/2018 03:41 PM   Modules accepted: Orders

## 2019-01-02 ENCOUNTER — Telehealth: Payer: Self-pay | Admitting: *Deleted

## 2019-01-02 DIAGNOSIS — H30033 Focal chorioretinal inflammation, peripheral, bilateral: Secondary | ICD-10-CM | POA: Diagnosis not present

## 2019-01-02 DIAGNOSIS — H35371 Puckering of macula, right eye: Secondary | ICD-10-CM | POA: Diagnosis not present

## 2019-01-02 DIAGNOSIS — H3581 Retinal edema: Secondary | ICD-10-CM | POA: Diagnosis not present

## 2019-01-02 DIAGNOSIS — H35342 Macular cyst, hole, or pseudohole, left eye: Secondary | ICD-10-CM | POA: Diagnosis not present

## 2019-01-02 DIAGNOSIS — H44113 Panuveitis, bilateral: Secondary | ICD-10-CM | POA: Diagnosis not present

## 2019-01-02 DIAGNOSIS — H348332 Tributary (branch) retinal vein occlusion, bilateral, stable: Secondary | ICD-10-CM | POA: Diagnosis not present

## 2019-01-02 NOTE — Telephone Encounter (Signed)
I left a message for the pt to return my call. 

## 2019-01-02 NOTE — Telephone Encounter (Signed)
These let them know I am sorry that they are having a tough time with this decision.  Unfortunately I am not an eye specialist, so I am not able to provide them with the best treatment recommendations for this.  Would recommend that they discuss further with her current specialist. I will say, I know a lot of patients to take methotrexate and do fine with that, but would recommend she discuss her concerns with her specialist prior to starting and make sure that they see her frequently once they started to check labs, etc.

## 2019-01-02 NOTE — Telephone Encounter (Signed)
Patient's husband came in to the office and stated the pt was seen by the eye specialist-Dr Manuella Ghazi at [facility] today.  She was given an injection in the eye (which would be every 3 months) and asked if there were any other options to save her eyesight.  He stated Dr Manuella Ghazi stated if it was his wife, he would advise the eye injection she could self-administer at home OR Methotrexate 2.5mg -three times a week and Folic acid 2.5mg  once a day.  She took one Methotrexate and did not want to continue this after reading the side effects, especially concerned due to low WBC count.  Message sent to Dr Maudie Mercury to address as the patient requested Dr Julianne Rice opinion.

## 2019-01-03 NOTE — Telephone Encounter (Signed)
I called the pt and informed her of the message below

## 2019-01-03 NOTE — Telephone Encounter (Signed)
Pt is returning call to office. Please call back on home number (on file)

## 2019-01-09 DIAGNOSIS — M545 Low back pain: Secondary | ICD-10-CM | POA: Diagnosis not present

## 2019-02-12 ENCOUNTER — Encounter: Payer: Self-pay | Admitting: Family Medicine

## 2019-02-13 DIAGNOSIS — H30033 Focal chorioretinal inflammation, peripheral, bilateral: Secondary | ICD-10-CM | POA: Diagnosis not present

## 2019-02-13 DIAGNOSIS — H3581 Retinal edema: Secondary | ICD-10-CM | POA: Diagnosis not present

## 2019-02-19 ENCOUNTER — Telehealth: Payer: Self-pay

## 2019-02-19 NOTE — Telephone Encounter (Signed)
Author phoned pt. to offer to do awv and rov with Dr. Maudie Mercury for 3/24 virtually instead of in office d/t covid-19 pandemic. Pt. Stated she did not have technology to do virtually either on computer or phone, and did not have anyone to help her get set up. Author made pt. aware that Dr. Maudie Mercury would be retiring at end of April, pt. became tearful, and requested Dr. Maudie Mercury to call her to discuss who she would recommend to transfer to. AWV rescheduled for summer time-frame.

## 2019-02-20 ENCOUNTER — Ambulatory Visit: Payer: Medicare Other

## 2019-02-20 ENCOUNTER — Ambulatory Visit: Payer: Medicare Other | Admitting: Family Medicine

## 2019-03-01 DIAGNOSIS — N183 Chronic kidney disease, stage 3 (moderate): Secondary | ICD-10-CM | POA: Diagnosis not present

## 2019-03-06 NOTE — Telephone Encounter (Signed)
Please set up phone follow up visit with me this month.

## 2019-03-16 DIAGNOSIS — H35371 Puckering of macula, right eye: Secondary | ICD-10-CM | POA: Diagnosis not present

## 2019-03-16 DIAGNOSIS — H44113 Panuveitis, bilateral: Secondary | ICD-10-CM | POA: Diagnosis not present

## 2019-03-16 DIAGNOSIS — H348332 Tributary (branch) retinal vein occlusion, bilateral, stable: Secondary | ICD-10-CM | POA: Diagnosis not present

## 2019-03-16 DIAGNOSIS — H35342 Macular cyst, hole, or pseudohole, left eye: Secondary | ICD-10-CM | POA: Diagnosis not present

## 2019-03-16 DIAGNOSIS — H3581 Retinal edema: Secondary | ICD-10-CM | POA: Diagnosis not present

## 2019-03-16 DIAGNOSIS — H30033 Focal chorioretinal inflammation, peripheral, bilateral: Secondary | ICD-10-CM | POA: Diagnosis not present

## 2019-03-20 ENCOUNTER — Encounter: Payer: Self-pay | Admitting: Family Medicine

## 2019-03-20 ENCOUNTER — Other Ambulatory Visit: Payer: Self-pay

## 2019-03-20 ENCOUNTER — Ambulatory Visit (INDEPENDENT_AMBULATORY_CARE_PROVIDER_SITE_OTHER): Payer: Medicare Other | Admitting: Family Medicine

## 2019-03-20 DIAGNOSIS — G8929 Other chronic pain: Secondary | ICD-10-CM | POA: Diagnosis not present

## 2019-03-20 DIAGNOSIS — D631 Anemia in chronic kidney disease: Secondary | ICD-10-CM

## 2019-03-20 DIAGNOSIS — D7589 Other specified diseases of blood and blood-forming organs: Secondary | ICD-10-CM

## 2019-03-20 DIAGNOSIS — M810 Age-related osteoporosis without current pathological fracture: Secondary | ICD-10-CM

## 2019-03-20 DIAGNOSIS — D649 Anemia, unspecified: Secondary | ICD-10-CM

## 2019-03-20 DIAGNOSIS — F32A Depression, unspecified: Secondary | ICD-10-CM

## 2019-03-20 DIAGNOSIS — F332 Major depressive disorder, recurrent severe without psychotic features: Secondary | ICD-10-CM | POA: Diagnosis not present

## 2019-03-20 DIAGNOSIS — F419 Anxiety disorder, unspecified: Secondary | ICD-10-CM

## 2019-03-20 DIAGNOSIS — N189 Chronic kidney disease, unspecified: Secondary | ICD-10-CM

## 2019-03-20 DIAGNOSIS — F329 Major depressive disorder, single episode, unspecified: Secondary | ICD-10-CM | POA: Diagnosis not present

## 2019-03-20 DIAGNOSIS — N183 Chronic kidney disease, stage 3 unspecified: Secondary | ICD-10-CM

## 2019-03-20 NOTE — Progress Notes (Signed)
2Virtual Visit via Video Note  I connected with Danielle Gaines  on 03/20/19 at 10:00 AM EDT by a video enabled telemedicine application and verified that I am speaking with the correct person using two identifiers.  Location patient: home Location provider:work or home office Persons participating in the virtual visit:husband, patient, provider  I discussed the limitations of evaluation and management by telemedicine and the availability of in person appointments. The patient expressed understanding and agreed to proceed.   HPI:  Follow up. PMH significant for chronic pain, chronic fatigue, anxiety and depression, chronic nausea, migraines, GERD, CKD. She sees a number of specialist and is on a number of sedating medications and fatigue has been a chronic complaint.Unchanged per pt and husband. Husband reports she had labs recently with her nephrologist - about 2 weeks ago. Reports labs ok. Saw psychiatrist recently as well.  Sees Dr. Loanne Drilling for osteoporosis. Is on prolia now.  Symptoms have been unchanged. They are isolated for the most part from others. Husband rarely goes to work - wears mask. They do not want to come to the office for labs in light of the [place] pandemic. They as for my recommendations for a new doctor given I will be leaving clinical practice.  ROS: See pertinent positives and negatives per HPI.  Past Medical History:  Diagnosis Date  . Arthritis   . Benzodiazepine dependence ([place])   . Carotid arterial disease (HCC)    mild  . Chronic kidney disease (CKD), stage III (moderate) ([place]) 08/01/2018   -seeing  Nephrology, Dr. Posey Pronto, thought from [place] per renal notes   . Chronic pain    managed by pain clinic  . Colon abnormality    didn't work right so part of it was [facility] at [facility]  . Depression    managed by Dr. Toy Care  . Diastolic heart failure ([place])    mild on echo 2016  . GERD (gastroesophageal reflux disease)   . IBS (irritable bowel syndrome)    sees Dr. Earlean Shawl   . Macular degeneration    goes to [facility] this  . Migraine    managed by neurologist  . Osteoporosis   . Vertigo     Past Surgical History:  Procedure Laterality Date  . ABDOMINAL ADHESION SURGERY    . ABDOMINAL HYSTERECTOMY    . APPENDECTOMY    . CHOLECYSTECTOMY    . COLON SURGERY    . CYSTOSCOPY W/ URETERAL STENT PLACEMENT Right 04/27/2016   Procedure: CYSTOSCOPY WITH RETROGRADE PYELOGRAM/ RIGHT URETERAL STENT PLACEMENT;  Surgeon: Ardis Hughs, MD;  Location: [facility];  Service: Urology;  Laterality: Right;  . OVARIAN CYST SURGERY    . SMALL INTESTINE SURGERY      Family History  Problem Relation Age of Onset  . Kidney disease Mother   . Heart disease Mother   . Osteoporosis Mother   . Heart Problems Sister   . Cancer Maternal Aunt        intestinal and liver cancer  . Stroke Maternal Grandmother   . Heart disease Maternal Grandmother   . Stroke Unknown   . Depression Unknown        everyone  . Hypercalcemia Neg Hx   . Breast cancer Neg Hx     SOCIAL HX: see hpi   Current Outpatient Medications:  .  acetaminophen (TYLENOL) 500 MG tablet, Take 1,000 mg by mouth daily as needed for headache., Disp: , Rfl:  .  amphetamine-dextroamphetamine (ADDERALL XR) 20 MG 24 hr  capsule, Take 40 mg by mouth. Every morning, Disp: , Rfl:  .  Biotin 1000 MCG tablet, Take 1,000 mcg by mouth daily., Disp: , Rfl:  .  Calcium Carbonate (CALTRATE 600 PO), Take by mouth daily., Disp: , Rfl:  .  citalopram (CELEXA) 20 MG tablet, TK 1 T PO D, Disp: , Rfl: 2 .  cycloSPORINE (RESTASIS) 0.05 % ophthalmic emulsion, Place 1 drop into both eyes 2 (two) times daily. , Disp: , Rfl:  .  denosumab (PROLIA) 60 MG/ML SOSY injection, Inject 60 mg into the skin every 6 (six) months., Disp: , Rfl:  .  LINZESS 290 MCG CAPS capsule, Take 290 mcg by mouth daily before breakfast. , Disp: , Rfl: 0 .  LORazepam (ATIVAN) 0.5 MG tablet, Take 0.5 mg by mouth 3 (three) times daily as needed for anxiety.,  Disp: , Rfl:  .  olopatadine (PATANOL) 0.1 % ophthalmic solution, 1 drop 2 (two) times daily., Disp: , Rfl:  .  omeprazole (PRILOSEC) 40 MG capsule, Take 80 mg by mouth daily. , Disp: , Rfl: 0 .  Probiotic Product (ALIGN PO), Take by mouth daily., Disp: , Rfl:  .  sodium bicarbonate 650 MG tablet, TK 1 T PO BID, Disp: , Rfl: 9 .  SUMAtriptan (IMITREX) 100 MG tablet, Take 100 mg by mouth every 2 (two) hours as needed for migraine. , Disp: , Rfl:  .  topiramate (TOPAMAX) 100 MG tablet, Take 3 tablets at bedtime, Disp: , Rfl:  .  VITAMIN D PO, Take by mouth daily., Disp: , Rfl:  .  ZENPEP 15000 units CPEP, Take 45,000 mg by mouth 2 (two) times daily. , Disp: , Rfl: 0  EXAM:  VITALS per patient if applicable:  GENERAL: alert, oriented, appears well and in no acute distress  HEENT: atraumatic, conjunttiva clear, no obvious abnormalities on inspection of external nose and ears  NECK: normal movements of the head and neck  LUNGS: on inspection no signs of respiratory distress, breathing rate appears normal, no obvious gross SOB, gasping or wheezing  CV: no obvious cyanosis  MS: moves all visible extremities without noticeable abnormality  PSYCH/NEURO: pleasant and cooperative, no obvious depression or anxiety, speech and thought processing grossly intact  ASSESSMENT AND PLAN:  Discussed the following assessment and plan:  MDD (major depressive disorder), recurrent severe, without psychosis (HCC)  Chronic kidney disease (CKD), stage III (moderate) (HCC)  Anemia of chronic renal failure, unspecified CKD stage  Anxiety and depression - managed at [facility]  Other chronic pain  Osteoporosis, unspecified osteoporosis type, unspecified pathological fracture presence  Encouraged healthy diet. Continue care with specialists. Will need PCP in office and they opted to see [name] - will have my assistant schedule visit. Advised b12/folate levels - but they declined for now in light  of the pandemic. Advised assistant to obtain labs from nephrology.   Follow up instructions: Advised assistant Wendie Simmer to help patient arrange the following: -TOC visit with [name] in 3 months -labs from nephrology, please fax   I discussed the assessment and treatment plan with the patient. The patient was provided an opportunity to ask questions and all were answered. The patient agreed with the plan and demonstrated an understanding of the instructions.   Follow up as needed in interim  Lucretia Kern, DO

## 2019-04-11 ENCOUNTER — Telehealth: Payer: Self-pay

## 2019-04-11 NOTE — Telephone Encounter (Addendum)
LVM requesting patient call back to schedule nurse visit to get Prolia- she owes $0  and is ready to be scheduled

## 2019-04-17 NOTE — Telephone Encounter (Signed)
LMTCB to schedule Prolia- she owes $0 at check-in

## 2019-04-19 DIAGNOSIS — G43709 Chronic migraine without aura, not intractable, without status migrainosus: Secondary | ICD-10-CM | POA: Diagnosis not present

## 2019-04-19 DIAGNOSIS — M25511 Pain in right shoulder: Secondary | ICD-10-CM | POA: Diagnosis not present

## 2019-04-19 DIAGNOSIS — M47812 Spondylosis without myelopathy or radiculopathy, cervical region: Secondary | ICD-10-CM | POA: Diagnosis not present

## 2019-04-25 ENCOUNTER — Other Ambulatory Visit: Payer: Self-pay

## 2019-04-25 ENCOUNTER — Ambulatory Visit (INDEPENDENT_AMBULATORY_CARE_PROVIDER_SITE_OTHER): Payer: Medicare Other

## 2019-04-25 DIAGNOSIS — M810 Age-related osteoporosis without current pathological fracture: Secondary | ICD-10-CM | POA: Diagnosis not present

## 2019-04-25 MED ORDER — DENOSUMAB 60 MG/ML ~~LOC~~ SOSY
60.0000 mg | PREFILLED_SYRINGE | Freq: Once | SUBCUTANEOUS | Status: AC
  Administered 2019-04-25: 60 mg via SUBCUTANEOUS

## 2019-04-25 NOTE — Progress Notes (Signed)
Per orders of Dr. Ellison injection of Prolia given today by L. Walker CMA . Patient tolerated injection well. 

## 2019-04-27 DIAGNOSIS — H35372 Puckering of macula, left eye: Secondary | ICD-10-CM | POA: Diagnosis not present

## 2019-04-27 DIAGNOSIS — H3581 Retinal edema: Secondary | ICD-10-CM | POA: Diagnosis not present

## 2019-04-27 DIAGNOSIS — H44113 Panuveitis, bilateral: Secondary | ICD-10-CM | POA: Diagnosis not present

## 2019-04-27 DIAGNOSIS — H30033 Focal chorioretinal inflammation, peripheral, bilateral: Secondary | ICD-10-CM | POA: Diagnosis not present

## 2019-05-02 ENCOUNTER — Telehealth: Payer: Self-pay | Admitting: Endocrinology

## 2019-05-02 NOTE — Telephone Encounter (Signed)
Patient is calling in regards to feeling like she is having a reaction to her Prolia. She states that she is constantly hot and always in a sweat.  Please Advise, Thanks

## 2019-05-02 NOTE — Telephone Encounter (Signed)
Copied from [place] [id number]. Topic: General - Call Back - No Documentation >> May 02, 2019 10:48 AM Erick Blinks wrote: Pt is requesting is call back, dr. Althia Forts office wants pt to be seen by PCP  Best Contact: [phone]

## 2019-05-02 NOTE — Telephone Encounter (Signed)
F/u - next available

## 2019-05-02 NOTE — Telephone Encounter (Signed)
LOV 09/06/18. Please advise if pt needs to f/u with PCP or if office visit will be required with you. If you want to see pt, do you want labs BEFORE visit?

## 2019-05-02 NOTE — Telephone Encounter (Signed)
Called pt as requested. Per Dr. Loanne Drilling, does not feel that s/x are related to Prolia injection. Would prefer pt f/u with PCP. Call to schedule f/u with Dr. Loanne Drilling AFTER seeing PCP. Verbalized acceptance and understanding.

## 2019-05-02 NOTE — Telephone Encounter (Signed)
I called the pt and scheduled a phone visit with Dr Maudie Mercury on 6/9.

## 2019-05-04 DIAGNOSIS — H01003 Unspecified blepharitis right eye, unspecified eyelid: Secondary | ICD-10-CM | POA: Diagnosis not present

## 2019-05-04 DIAGNOSIS — H348332 Tributary (branch) retinal vein occlusion, bilateral, stable: Secondary | ICD-10-CM | POA: Diagnosis not present

## 2019-05-04 DIAGNOSIS — Z9841 Cataract extraction status, right eye: Secondary | ICD-10-CM | POA: Diagnosis not present

## 2019-05-04 DIAGNOSIS — H00011 Hordeolum externum right upper eyelid: Secondary | ICD-10-CM | POA: Diagnosis not present

## 2019-05-04 DIAGNOSIS — R42 Dizziness and giddiness: Secondary | ICD-10-CM | POA: Diagnosis not present

## 2019-05-04 DIAGNOSIS — H44113 Panuveitis, bilateral: Secondary | ICD-10-CM | POA: Diagnosis not present

## 2019-05-04 DIAGNOSIS — H30033 Focal chorioretinal inflammation, peripheral, bilateral: Secondary | ICD-10-CM | POA: Diagnosis not present

## 2019-05-04 DIAGNOSIS — H35342 Macular cyst, hole, or pseudohole, left eye: Secondary | ICD-10-CM | POA: Diagnosis not present

## 2019-05-04 DIAGNOSIS — H3581 Retinal edema: Secondary | ICD-10-CM | POA: Diagnosis not present

## 2019-05-04 DIAGNOSIS — H35371 Puckering of macula, right eye: Secondary | ICD-10-CM | POA: Diagnosis not present

## 2019-05-04 DIAGNOSIS — Z9842 Cataract extraction status, left eye: Secondary | ICD-10-CM | POA: Diagnosis not present

## 2019-05-04 DIAGNOSIS — Z961 Presence of intraocular lens: Secondary | ICD-10-CM | POA: Diagnosis not present

## 2019-05-08 ENCOUNTER — Other Ambulatory Visit: Payer: Self-pay

## 2019-05-08 ENCOUNTER — Ambulatory Visit: Payer: Medicare Other | Admitting: Family Medicine

## 2019-05-10 DIAGNOSIS — N183 Chronic kidney disease, stage 3 (moderate): Secondary | ICD-10-CM | POA: Diagnosis not present

## 2019-05-11 DIAGNOSIS — H00011 Hordeolum externum right upper eyelid: Secondary | ICD-10-CM | POA: Diagnosis not present

## 2019-05-11 DIAGNOSIS — Z961 Presence of intraocular lens: Secondary | ICD-10-CM | POA: Diagnosis not present

## 2019-05-11 DIAGNOSIS — H26491 Other secondary cataract, right eye: Secondary | ICD-10-CM | POA: Diagnosis not present

## 2019-05-15 ENCOUNTER — Ambulatory Visit (INDEPENDENT_AMBULATORY_CARE_PROVIDER_SITE_OTHER): Payer: Medicare Other | Admitting: Family Medicine

## 2019-05-15 ENCOUNTER — Other Ambulatory Visit: Payer: Self-pay

## 2019-05-15 ENCOUNTER — Encounter: Payer: Self-pay | Admitting: Family Medicine

## 2019-05-15 DIAGNOSIS — H00011 Hordeolum externum right upper eyelid: Secondary | ICD-10-CM

## 2019-05-15 DIAGNOSIS — R232 Flushing: Secondary | ICD-10-CM

## 2019-05-15 NOTE — Progress Notes (Signed)
Virtual Visit via Video Note  I connected with Danielle Gaines  on 05/15/19 at 11:20 AM EDT by a video enabled telemedicine application and verified that I am speaking with the correct person using two identifiers.  Location patient: home Location provider:work or home office Persons participating in the virtual visit: patient, provider  I discussed the limitations of evaluation and management by telemedicine and the availability of in person appointments. The patient expressed understanding and agreed to proceed.   HPI:  Acute visit for:  1)Sweating with prolia injection: -occurred right as she got the prolia injection and since, has had two injections and started with getting them about 6 months ago -she has a history of hot flashes and this feels similar, she has had this in the past and had an extensive evaluation and saw gynecologist and was on hormones, but HRT made things worse, then reports resolved until started prolia -no malaise, fevers, SOB, NVD -she has had greener stools, she always has frequent and loose stools. Is S/p colectomy. Sees GI for IBS. -reports had extensive labs with Dr. Posey Pronto last week and thinks thyroid was checked  2) Sty on the eye: -for 2 weeks -has been seeing her eye doctor about this -[facility] -they put on tobra eye drops, but it elevated her pressure in her eye -when she followed up they took her off of the tobra and now is using compression, still not resolved -no worsening or vision changes   ROS: See pertinent positives and negatives per HPI.  Past Medical History:  Diagnosis Date  . Arthritis   . Benzodiazepine dependence ([place])   . Carotid arterial disease (HCC)    mild  . Chronic kidney disease (CKD), stage III (moderate) ([place]) 08/01/2018   -seeing  Nephrology, Dr. Posey Pronto, thought from [place] per renal notes   . Chronic pain    managed by pain clinic  . Colon abnormality    didn't work right so part of it was removed at [facility]  . Depression    managed by Dr. Toy Care  . Diastolic heart failure ([place])    mild on echo 2016  . GERD (gastroesophageal reflux disease)   . IBS (irritable bowel syndrome)    sees Dr. Earlean Shawl  . Macular degeneration    goes to [facility] for this  . Migraine    managed by neurologist  . Osteoporosis   . Vertigo     Past Surgical History:  Procedure Laterality Date  . ABDOMINAL ADHESION SURGERY    . ABDOMINAL HYSTERECTOMY    . APPENDECTOMY    . CHOLECYSTECTOMY    . COLON SURGERY    . CYSTOSCOPY W/ URETERAL STENT PLACEMENT Right 04/27/2016   Procedure: CYSTOSCOPY WITH RETROGRADE PYELOGRAM/ RIGHT URETERAL STENT PLACEMENT;  Surgeon: Ardis Hughs, MD;  Location: [facility];  Service: Urology;  Laterality: Right;  . OVARIAN CYST SURGERY    . SMALL INTESTINE SURGERY      Family History  Problem Relation Age of Onset  . Kidney disease Mother   . Heart disease Mother   . Osteoporosis Mother   . Heart Problems Sister   . Cancer Maternal Aunt        intestinal and liver cancer  . Stroke Maternal Grandmother   . Heart disease Maternal Grandmother   . Stroke Unknown   . Depression Unknown        everyone  . Hypercalcemia Neg Hx   . Breast cancer Neg Hx     SOCIAL HX:  see hpi   Current Outpatient Medications:  .  acetaminophen (TYLENOL) 500 MG tablet, Take 1,000 mg by mouth daily as needed for headache., Disp: , Rfl:  .  amphetamine-dextroamphetamine (ADDERALL XR) 20 MG 24 hr capsule, Take 40 mg by mouth. Every morning, Disp: , Rfl:  .  Biotin 1000 MCG tablet, Take 1,000 mcg by mouth daily., Disp: , Rfl:  .  Calcium Carbonate (CALTRATE 600 PO), Take by mouth daily., Disp: , Rfl:  .  citalopram (CELEXA) 20 MG tablet, TK 1 T PO D, Disp: , Rfl: 2 .  cycloSPORINE (RESTASIS) 0.05 % ophthalmic emulsion, Place 1 drop into both eyes 2 (two) times daily. , Disp: , Rfl:  .  denosumab (PROLIA) 60 MG/ML SOSY injection, Inject 60 mg into the skin every 6 (six) months., Disp: , Rfl:  .  DORZOLAMIDE HCL OP,  Apply 1 drop to eye 2 (two) times a day., Disp: , Rfl:  .  LINZESS 290 MCG CAPS capsule, Take 290 mcg by mouth daily before breakfast. , Disp: , Rfl: 0 .  LORazepam (ATIVAN) 0.5 MG tablet, Take 0.5 mg by mouth 3 (three) times daily as needed for anxiety., Disp: , Rfl:  .  olopatadine (PATANOL) 0.1 % ophthalmic solution, 1 drop 2 (two) times daily., Disp: , Rfl:  .  omeprazole (PRILOSEC) 40 MG capsule, Take 80 mg by mouth daily. , Disp: , Rfl: 0 .  Probiotic Product (ALIGN PO), Take by mouth daily., Disp: , Rfl:  .  sodium bicarbonate 650 MG tablet, TK 1 T PO BID, Disp: , Rfl: 9 .  SUMAtriptan (IMITREX) 100 MG tablet, Take 100 mg by mouth every 2 (two) hours as needed for migraine. , Disp: , Rfl:  .  topiramate (TOPAMAX) 100 MG tablet, Take 3 tablets at bedtime, Disp: , Rfl:  .  VITAMIN D PO, Take by mouth daily., Disp: , Rfl:  .  ZENPEP 15000 units CPEP, Take 45,000 mg by mouth 2 (two) times daily. , Disp: , Rfl: 0  EXAM:  VITALS per patient if applicable:  GENERAL: alert, oriented, appears well and in no acute distress  HEENT: atraumatic, conjunttiva clear, no obvious abnormalities on inspection of external nose and ears, erythema of R upper eyelid  NECK: normal movements of the head and neck  LUNGS: on inspection no signs of respiratory distress, breathing rate appears normal, no obvious gross SOB, gasping or wheezing  CV: no obvious cyanosis  MS: moves all visible extremities without noticeable abnormality  PSYCH/NEURO: pleasant and cooperative, no obvious depression or anxiety, speech and thought processing grossly intact  ASSESSMENT AND PLAN:  Discussed the following assessment and plan:  Hot flashes -  -discussed varied potential causes of hot flashes and sweating. I am not sure about the associating with the prolia. She has had a thyroid check and basic labs since this started. The next step would be a workup with endocrinology and she agrees to call her endocrinologist  about this and the prolia.  Hordeolum externum of right upper eyelid - Plan: advised she follow back up with her opthomologist as may need surgical treatment if is not resolving. She agrees.   I discussed the assessment and treatment plan with the patient. The patient was provided an opportunity to ask questions and all were answered. The patient agreed with the plan and demonstrated an understanding of the instructions.   The patient was advised to call back or seek an in-person evaluation if the symptoms worsen or if the  condition fails to improve as anticipated.   Lucretia Kern, DO

## 2019-05-17 DIAGNOSIS — I951 Orthostatic hypotension: Secondary | ICD-10-CM | POA: Diagnosis not present

## 2019-05-17 DIAGNOSIS — N183 Chronic kidney disease, stage 3 (moderate): Secondary | ICD-10-CM | POA: Diagnosis not present

## 2019-05-17 DIAGNOSIS — N2581 Secondary hyperparathyroidism of renal origin: Secondary | ICD-10-CM | POA: Diagnosis not present

## 2019-05-17 DIAGNOSIS — D631 Anemia in chronic kidney disease: Secondary | ICD-10-CM | POA: Diagnosis not present

## 2019-05-25 DIAGNOSIS — H26491 Other secondary cataract, right eye: Secondary | ICD-10-CM | POA: Diagnosis not present

## 2019-06-06 ENCOUNTER — Telehealth: Payer: Self-pay | Admitting: *Deleted

## 2019-06-06 NOTE — Telephone Encounter (Signed)
Copied from [place] [id number]. Topic: General - Other >> Jun 06, 2019 10:48 AM Ivar Drape wrote: Reason for CRM:   Please cancel 06/14/2019 Wellness Visit appt at 10:00am.  Patient will call back to reschedule.  Clinic RN canceled appointment

## 2019-06-11 ENCOUNTER — Other Ambulatory Visit: Payer: Self-pay

## 2019-06-12 ENCOUNTER — Other Ambulatory Visit: Payer: Self-pay

## 2019-06-13 ENCOUNTER — Ambulatory Visit (INDEPENDENT_AMBULATORY_CARE_PROVIDER_SITE_OTHER): Payer: Medicare Other | Admitting: Endocrinology

## 2019-06-13 ENCOUNTER — Encounter: Payer: Self-pay | Admitting: Endocrinology

## 2019-06-13 VITALS — BP 122/80 | HR 111 | Ht 61.0 in | Wt 119.0 lb

## 2019-06-13 DIAGNOSIS — M810 Age-related osteoporosis without current pathological fracture: Secondary | ICD-10-CM | POA: Diagnosis not present

## 2019-06-13 DIAGNOSIS — N183 Chronic kidney disease, stage 3 (moderate): Secondary | ICD-10-CM | POA: Diagnosis not present

## 2019-06-13 DIAGNOSIS — R61 Generalized hyperhidrosis: Secondary | ICD-10-CM | POA: Diagnosis not present

## 2019-06-13 LAB — VITAMIN D 25 HYDROXY (VIT D DEFICIENCY, FRACTURES): VITD: 68.31 ng/mL (ref 30.00–100.00)

## 2019-06-13 NOTE — Patient Instructions (Addendum)
blood tests are requested for you today.  We'll let you know about the results. If these tests do not explain the osteoporosis, we would request for you a twice a year shot called "Prolia."  It is critically important to prevent falling down (keep floor areas well-lit, dry, and free of loose objects.  If you have a cane, walker, or wheelchair, you should use it, even for short trips around the house.  Wear flat-soled shoes.  Also, try not to rush). a 24-HR urine test is requested for you today.  We'll let you know about the results.   Please redo the bone density again in September.  Please come back for a follow-up appointment in 1 year.

## 2019-06-13 NOTE — Progress Notes (Signed)
Subjective:    Patient ID: Danielle Gaines, female    DOB: 1941-05-15, 78 y.o.   MRN: [id number]  HPI Pt returns for f/u of osteoporosis: Dx'ed: 2004 Secondary cause: early menopause (age 78) Fractures: left face (2007), and coccyx (1999), both with falls.   Past rx: none Current rx: fosamax (since 2014), and Prolia (since 2019).  Last DEXA result: -2.8, at the [facility] (2019).  Other: she was seen in 2016 for hypercalcemia, and vit-D was found to be high; she also had urolithiasis in 2016.   Interval hx: she does not know vitamin-D dosage.  Pt states few months of moderate sweating throughout the body, and assoc headache.  She gets sxs daily.  She is unable to cite precip factor.  Imitrex helps.   Past Medical History:  Diagnosis Date  . Arthritis   . Benzodiazepine dependence ([place])   . Carotid arterial disease (HCC)    mild  . Chronic kidney disease (CKD), stage III (moderate) ([place]) 08/01/2018   -seeing  Nephrology, Dr. Posey Pronto, thought from [place] per renal notes   . Chronic pain    managed by pain clinic  . Colon abnormality    didn't work right so part of it was removed at [facility]  . Depression    managed by Dr. Toy Care  . Diastolic heart failure ([place])    mild on echo 2016  . GERD (gastroesophageal reflux disease)   . IBS (irritable bowel syndrome)    sees Dr. Earlean Shawl  . Macular degeneration    goes to [facility] for this  . Migraine    managed by neurologist  . Osteoporosis   . Vertigo     Past Surgical History:  Procedure Laterality Date  . ABDOMINAL ADHESION SURGERY    . ABDOMINAL HYSTERECTOMY    . APPENDECTOMY    . CHOLECYSTECTOMY    . COLON SURGERY    . CYSTOSCOPY W/ URETERAL STENT PLACEMENT Right 04/27/2016   Procedure: CYSTOSCOPY WITH RETROGRADE PYELOGRAM/ RIGHT URETERAL STENT PLACEMENT;  Surgeon: Ardis Hughs, MD;  Location: [facility];  Service: Urology;  Laterality: Right;  . OVARIAN CYST SURGERY    . SMALL INTESTINE SURGERY      Social History    Socioeconomic History  . Marital status: Married    Spouse name: Not on file  . Number of children: Not on file  . Years of education: Not on file  . Highest education level: Not on file  Occupational History  . Not on file  Social Needs  . Financial resource strain: Not on file  . Food insecurity    Worry: Not on file    Inability: Not on file  . Transportation needs    Medical: Not on file    Non-medical: Not on file  Tobacco Use  . Smoking status: Never Smoker  . Smokeless tobacco: Never Used  Substance and Sexual Activity  . Alcohol use: No  . Drug use: No  . Sexual activity: Not Currently    Birth control/protection: Post-menopausal  Lifestyle  . Physical activity    Days per week: Not on file    Minutes per session: Not on file  . Stress: Not on file  Relationships  . Social Herbalist on phone: Not on file    Gets together: Not on file    Attends religious service: Not on file    Active member of club or organization: Not on file    Attends meetings of  clubs or organizations: Not on file    Relationship status: Not on file  . Intimate partner violence    Fear of current or ex partner: Not on file    Emotionally abused: Not on file    Physically abused: Not on file    Forced sexual activity: Not on file  Other Topics Concern  . Not on file  Social History Narrative   Work or School: none      Home Situation: lives with husband      Spiritual Beliefs:       Lifestyle: no regular exercise; diet is ok          Current Outpatient Medications on File Prior to Visit  Medication Sig Dispense Refill  . acetaminophen (TYLENOL) 500 MG tablet Take 1,000 mg by mouth daily as needed for headache.    . amphetamine-dextroamphetamine (ADDERALL XR) 20 MG 24 hr capsule Take 40 mg by mouth. Every morning    . Biotin 1000 MCG tablet Take 1,000 mcg by mouth daily.    . Calcium Carbonate (CALTRATE 600 PO) Take by mouth daily.    . citalopram (CELEXA) 20 MG  tablet TK 1 T PO D  2  . cycloSPORINE (RESTASIS) 0.05 % ophthalmic emulsion Place 1 drop into both eyes 2 (two) times daily.     Marland Kitchen denosumab (PROLIA) 60 MG/ML SOSY injection Inject 60 mg into the skin every 6 (six) months.    . DORZOLAMIDE HCL OP Apply 1 drop to eye 2 (two) times a day.    Marland Kitchen LINZESS 290 MCG CAPS capsule Take 290 mcg by mouth daily before breakfast.   0  . LORazepam (ATIVAN) 0.5 MG tablet Take 0.5 mg by mouth 3 (three) times daily as needed for anxiety.    Marland Kitchen olopatadine (PATANOL) 0.1 % ophthalmic solution 1 drop 2 (two) times daily.    Marland Kitchen omeprazole (PRILOSEC) 40 MG capsule Take 80 mg by mouth daily.   0  . Probiotic Product (ALIGN PO) Take by mouth daily.    . sodium bicarbonate 650 MG tablet TK 1 T PO BID  9  . SUMAtriptan (IMITREX) 100 MG tablet Take 100 mg by mouth every 2 (two) hours as needed for migraine.     . topiramate (TOPAMAX) 100 MG tablet Take 3 tablets at bedtime    . VITAMIN D PO Take by mouth daily.    Marland Kitchen ZENPEP 15000 units CPEP Take 45,000 mg by mouth 2 (two) times daily.   0   No current facility-administered medications on file prior to visit.     Allergies  Allergen Reactions  . Sulfa Antibiotics Swelling and Other (See Comments)    Reaction:  Unspecified swelling reaction     Family History  Problem Relation Age of Onset  . Kidney disease Mother   . Heart disease Mother   . Osteoporosis Mother   . Heart Problems Sister   . Cancer Maternal Aunt        intestinal and liver cancer  . Stroke Maternal Grandmother   . Heart disease Maternal Grandmother   . Stroke Unknown   . Depression Unknown        everyone  . Hypercalcemia Neg Hx   . Breast cancer Neg Hx     BP 122/80 (BP Location: Left Arm, Patient Position: Sitting, Cuff Size: Normal)   Pulse (!) 111   Ht 5\' 1"  (1.549 m)   Wt 119 lb (54 kg)   SpO2 95%   BMI  22.48 kg/m    Review of Systems She has gained weight.  Denies falls    Objective:   Physical Exam VITAL SIGNS:  See vs  page GENERAL: no distress Gait: normal and steady.   Lab Results  Component Value Date   TSH 1.79 12/21/2018   Lab Results  Component Value Date   CREATININE 1.11 12/21/2018   BUN 28 (H) 12/21/2018   NA 139 12/21/2018   K 3.7 12/21/2018   CL 110 12/21/2018   CO2 19 12/21/2018     CT (2017): adrenals are normal.      Assessment & Plan:  Osteoporosis: due for recheck of labs Diaphoresis, new, uncertain etiology   Patient Instructions  blood tests are requested for you today.  We'll let you know about the results. If these tests do not explain the osteoporosis, we would request for you a twice a year shot called "Prolia."  It is critically important to prevent falling down (keep floor areas well-lit, dry, and free of loose objects.  If you have a cane, walker, or wheelchair, you should use it, even for short trips around the house.  Wear flat-soled shoes.  Also, try not to rush). a 24-HR urine test is requested for you today.  We'll let you know about the results.   Please redo the bone density again in September.  Please come back for a follow-up appointment in 1 year.

## 2019-06-14 ENCOUNTER — Ambulatory Visit: Payer: Medicare Other

## 2019-06-14 ENCOUNTER — Other Ambulatory Visit: Payer: Self-pay

## 2019-06-14 ENCOUNTER — Other Ambulatory Visit: Payer: Medicare Other

## 2019-06-14 ENCOUNTER — Ambulatory Visit: Payer: Medicare Other | Admitting: Endocrinology

## 2019-06-14 DIAGNOSIS — R61 Generalized hyperhidrosis: Secondary | ICD-10-CM

## 2019-06-17 LAB — CALCITONIN: Calcitonin: <2 pg/mL (ref <=5)

## 2019-06-19 DIAGNOSIS — H30033 Focal chorioretinal inflammation, peripheral, bilateral: Secondary | ICD-10-CM | POA: Diagnosis not present

## 2019-06-19 DIAGNOSIS — H3581 Retinal edema: Secondary | ICD-10-CM | POA: Diagnosis not present

## 2019-06-21 ENCOUNTER — Ambulatory Visit: Payer: Medicare Other | Admitting: Endocrinology

## 2019-06-23 LAB — CATECHOLAMINES, FRACTIONATED, URINE, 24 HOUR
Calc Total (E+NE): 48 mcg/24 h (ref 26–121)
Creatinine, Urine mg/day-CATEUR: 0.73 g/(24.h) (ref 0.50–2.15)
Dopamine 24 Hr Urine: 284 mcg/24 h (ref 52–480)
Norepinephrine, 24H, Ur: 48 mcg/24 h (ref 15–100)
Total Volume: 2450 mL

## 2019-06-23 LAB — METANEPHRINES, URINE, 24 HOUR
Metaneph Total, Ur: 518 mcg/24 h (ref 224–832)
Metanephrines, Ur: 98 mcg/24 h (ref 90–315)
Normetanephrine, 24H Ur: 420 mcg/24 h (ref 122–676)
Volume, Urine-VMAUR: 2450 mL

## 2019-06-27 ENCOUNTER — Ambulatory Visit (INDEPENDENT_AMBULATORY_CARE_PROVIDER_SITE_OTHER): Payer: Medicare Other | Admitting: Family Medicine

## 2019-06-27 ENCOUNTER — Other Ambulatory Visit: Payer: Self-pay

## 2019-06-27 DIAGNOSIS — R232 Flushing: Secondary | ICD-10-CM

## 2019-06-27 DIAGNOSIS — H3530 Unspecified macular degeneration: Secondary | ICD-10-CM

## 2019-06-27 DIAGNOSIS — N183 Chronic kidney disease, stage 3 unspecified: Secondary | ICD-10-CM

## 2019-06-27 DIAGNOSIS — M810 Age-related osteoporosis without current pathological fracture: Secondary | ICD-10-CM | POA: Diagnosis not present

## 2019-06-27 DIAGNOSIS — F419 Anxiety disorder, unspecified: Secondary | ICD-10-CM | POA: Diagnosis not present

## 2019-06-27 DIAGNOSIS — F329 Major depressive disorder, single episode, unspecified: Secondary | ICD-10-CM

## 2019-06-27 DIAGNOSIS — R109 Unspecified abdominal pain: Secondary | ICD-10-CM | POA: Diagnosis not present

## 2019-06-27 DIAGNOSIS — G8929 Other chronic pain: Secondary | ICD-10-CM | POA: Diagnosis not present

## 2019-06-27 NOTE — Progress Notes (Signed)
Virtual Visit via Video Note  I connected with Santiago Glad   on 06/27/19 at 11:30 AM EDT by a video enabled telemedicine application and verified that I am speaking with the correct person using two identifiers.  Location patient: home Location provider:work office Persons participating in the virtual visit: patient, provider  I discussed the limitations of evaluation and management by telemedicine and the availability of in person appointments. The patient expressed understanding and agreed to proceed.   SHARMILA WROBLESKI DOB: August 18, 1941 Encounter date: 06/27/2019  This is a 78 y.o. female who presents to establish care. No chief complaint on file.  Saw HK last on 6/16.  History of present illness: She is having hot flashes. Not sure why she is still having these. Went through menopause in 30's. Day and night. Saw endo; had some evaluation that she was told was ok, but these are still happening. Occurring for last 6 months. Feel like menopausal hot flashes she had in remote past.   Went to gyn and was put on estrogen but it made her crazy. Just couldn't take it - made her very nervous. This was a couple of years ago.   Having a lot of stomach pain. Needs to follow up with gastro. Sees dr. Earlean Shawl. Has been bothering her for 2-3 months. From below breast down. Not having bm's like she should.   Hasn't been getting neck/spine injections due to COVID.   Migraine: on topamax for a long time.  GERD: On Prilosec 80 mg daily.  Following with gastroenterology. Osteoporosis: following with endocrinology. She is getting prolia injections.  CKD III: Following with nephrology. Anemia: Has been stable. Anxiety/depression:following with Dr. Toy Care. Started citalopram about a year ago last August. Has been seeing her 6-7 years. Has had difficult time finding medication that works well for her. Side effects/not effective. States she thinks she has tried at least 11 different medications. Mac  degeneration: getting injections every 5-6 weeks.  Chronic pain: follows with pain specialist   Past Medical History:  Diagnosis Date  . Arthritis   . Benzodiazepine dependence ([place])   . Carotid arterial disease (HCC)    mild  . Chronic kidney disease (CKD), stage III (moderate) ([place]) 08/01/2018   -seeing  Nephrology, Dr. Posey Pronto, thought from [place] per renal notes   . Chronic pain    managed by pain clinic  . Colon abnormality    didn't work right so part of it was removed at [facility]  . Depression    managed by Dr. Toy Care  . Diastolic heart failure ([place])    mild on echo 2016  . GERD (gastroesophageal reflux disease)   . IBS (irritable bowel syndrome)    sees Dr. Earlean Shawl  . Macular degeneration    goes to [facility] this  . Migraine    managed by neurologist  . Osteoporosis   . Vertigo    Past Surgical History:  Procedure Laterality Date  . ABDOMINAL ADHESION SURGERY    . ABDOMINAL HYSTERECTOMY    . APPENDECTOMY    . CHOLECYSTECTOMY    . COLON SURGERY    . CYSTOSCOPY W/ URETERAL STENT PLACEMENT Right 04/27/2016   Procedure: CYSTOSCOPY WITH RETROGRADE PYELOGRAM/ RIGHT URETERAL STENT PLACEMENT;  Surgeon: Ardis Hughs, MD;  Location: [facility];  Service: Urology;  Laterality: Right;  . OVARIAN CYST SURGERY    . SMALL INTESTINE SURGERY     Allergies  Allergen Reactions  . Sulfa Antibiotics Swelling and Other (See Comments)  Reaction:  Unspecified swelling reaction    No outpatient medications have been marked as taking for the 06/27/19 encounter (Office Visit) with Caren Macadam, MD.   Social History   Tobacco Use  . Smoking status: Never Smoker  . Smokeless tobacco: Never Used  Substance Use Topics  . Alcohol use: No   Family History  Problem Relation Age of Onset  . Kidney disease Mother   . Heart disease Mother   . Osteoporosis Mother   . Heart Problems Sister   . Cancer Maternal Aunt        intestinal and liver cancer  . Stroke Maternal  Grandmother   . Heart disease Maternal Grandmother   . Stroke Unknown   . Depression Unknown        everyone  . Hypercalcemia Neg Hx   . Breast cancer Neg Hx      Review of Systems  Constitutional: Positive for diaphoresis (hot flashes/sweating episodes day and night) and fatigue. Negative for chills and fever.  Respiratory: Negative for cough, chest tightness, shortness of breath and wheezing.   Cardiovascular: Negative for chest pain, palpitations and leg swelling.  Gastrointestinal: Positive for abdominal pain.  Musculoskeletal: Positive for arthralgias, back pain, neck pain and neck stiffness.  Neurological: Positive for weakness.  Psychiatric/Behavioral: Positive for sleep disturbance. The patient is nervous/anxious.     Objective:  There were no vitals taken for this visit.      BP Readings from Last 3 Encounters:  06/13/19 122/80  12/21/18 100/70  10/18/18 110/72   Wt Readings from Last 3 Encounters:  06/13/19 119 lb (54 kg)  12/21/18 113 lb 6.4 oz (51.4 kg)  10/18/18 109 lb 3.2 oz (49.5 kg)    EXAM:  GENERAL: alert, oriented, appears well and in no acute distress  HEENT: atraumatic, conjunctiva clear, no obvious abnormalities on inspection of external nose and ears  NECK: normal movements of the head and neck  LUNGS: on inspection no signs of respiratory distress, breathing rate appears normal, no obvious gross SOB, gasping or wheezing  CV: no obvious cyanosis  MS: moves all visible extremities without noticeable abnormality  PSYCH/NEURO: pleasant and cooperative, no obvious depression or anxiety, speech and thought processing grossly intact  SKIN: No facial or neck abnormalities noted.  Assessment/Plan  1. Abdominal pain, unspecified abdominal location I have asked her to follow back up with gastroenterology for abdominal pain.  She was planning to do this but had not made appointment.  I also think that this would be important part of the evaluation  for the "hot flashes" that she has been having.  2. Hot flashes She did have evaluation with endocrinology.  Advised her that the last note in the system states that if still having symptoms she should follow back up for further evaluation.  We discussed that some of her medications are contributing to this as well.  She does feel that timeline of starting Celexa may fall within window of when the hot flashes started.  See below.  Other tamoxifen can also cause hot flashes, she is on this medication at the same dose for years.   3. Anxiety and depression - managed at [facility] I encouraged her to talk with her psychiatrist about Celexa possibly causing some of the hot flashes.  She thinks that she has been on this medication since about the time without rashes started.  We discussed slight decrease in dose to see if this helps with symptoms at all to alternating whole  and half tablets daily.  I did encourage her to reach out to her psychiatrist before doing this.    4. Macular degeneration, unspecified laterality, unspecified type She does follow-up regularly with ophthalmology.  5. Osteoporosis, unspecified osteoporosis type, unspecified pathological fracture presence She is getting Prolia injections.  She is taking calcium and vitamin D supplementation.  6. Other chronic pain Following with pain specialist.  7. Chronic kidney disease (CKD), stage III (moderate) ([place]) Following with nephrology.   Return i will check in with her next week for follow up on discussion today.    I discussed the assessment and treatment plan with the patient. The patient was provided an opportunity to ask questions and all were answered. The patient agreed with the plan and demonstrated an understanding of the instructions.   The patient was advised to call back or seek an in-person evaluation if the symptoms worsen or if the condition fails to improve as anticipated.  I provided 35 minutes of  non-face-to-face time during this encounter.   Micheline Rough, MD

## 2019-06-28 MED ORDER — FUROSEMIDE 40 MG PO TABS: 40.0000 mg | ORAL_TABLET | Freq: Every day | ORAL | 3 refills | Status: DC

## 2019-07-11 DIAGNOSIS — H00015 Hordeolum externum left lower eyelid: Secondary | ICD-10-CM | POA: Diagnosis not present

## 2019-07-11 DIAGNOSIS — R109 Unspecified abdominal pain: Secondary | ICD-10-CM | POA: Diagnosis not present

## 2019-07-11 DIAGNOSIS — Z961 Presence of intraocular lens: Secondary | ICD-10-CM | POA: Diagnosis not present

## 2019-07-11 DIAGNOSIS — R197 Diarrhea, unspecified: Secondary | ICD-10-CM | POA: Diagnosis not present

## 2019-07-13 ENCOUNTER — Other Ambulatory Visit: Payer: Self-pay | Admitting: Internal Medicine

## 2019-07-13 ENCOUNTER — Ambulatory Visit
Admission: RE | Admit: 2019-07-13 | Discharge: 2019-07-13 | Disposition: A | Discharge status: Home / Self Care | Payer: Medicare Other | Source: Ambulatory Visit | Attending: Internal Medicine | Admitting: Internal Medicine

## 2019-07-13 DIAGNOSIS — K5900 Constipation, unspecified: Secondary | ICD-10-CM

## 2019-07-19 ENCOUNTER — Other Ambulatory Visit: Payer: Self-pay | Admitting: Family Medicine

## 2019-07-19 DIAGNOSIS — Z1231 Encounter for screening mammogram for malignant neoplasm of breast: Secondary | ICD-10-CM

## 2019-08-13 ENCOUNTER — Inpatient Hospital Stay: Admission: RE | Admit: 2019-08-13 | Payer: Medicare Other | Source: Ambulatory Visit

## 2019-08-14 DIAGNOSIS — Z961 Presence of intraocular lens: Secondary | ICD-10-CM | POA: Diagnosis not present

## 2019-08-14 DIAGNOSIS — H35371 Puckering of macula, right eye: Secondary | ICD-10-CM | POA: Diagnosis not present

## 2019-08-14 DIAGNOSIS — H35342 Macular cyst, hole, or pseudohole, left eye: Secondary | ICD-10-CM | POA: Diagnosis not present

## 2019-08-14 DIAGNOSIS — Z9889 Other specified postprocedural states: Secondary | ICD-10-CM | POA: Diagnosis not present

## 2019-08-14 DIAGNOSIS — H348332 Tributary (branch) retinal vein occlusion, bilateral, stable: Secondary | ICD-10-CM | POA: Diagnosis not present

## 2019-08-14 DIAGNOSIS — H44113 Panuveitis, bilateral: Secondary | ICD-10-CM | POA: Diagnosis not present

## 2019-08-14 DIAGNOSIS — H30033 Focal chorioretinal inflammation, peripheral, bilateral: Secondary | ICD-10-CM | POA: Diagnosis not present

## 2019-08-14 DIAGNOSIS — H1859 Other hereditary corneal dystrophies: Secondary | ICD-10-CM | POA: Diagnosis not present

## 2019-08-14 DIAGNOSIS — H3581 Retinal edema: Secondary | ICD-10-CM | POA: Diagnosis not present

## 2019-08-15 ENCOUNTER — Ambulatory Visit (INDEPENDENT_AMBULATORY_CARE_PROVIDER_SITE_OTHER)
Admission: RE | Admit: 2019-08-15 | Discharge: 2019-08-15 | Disposition: A | Discharge status: Home / Self Care | Payer: Medicare Other | Source: Ambulatory Visit | Attending: Endocrinology | Admitting: Endocrinology

## 2019-08-15 ENCOUNTER — Other Ambulatory Visit: Payer: Self-pay

## 2019-08-15 DIAGNOSIS — M810 Age-related osteoporosis without current pathological fracture: Secondary | ICD-10-CM | POA: Diagnosis not present

## 2019-08-17 ENCOUNTER — Other Ambulatory Visit: Payer: Self-pay | Admitting: Endocrinology

## 2019-08-17 MED ORDER — TYMLOS 3120 MCG/1.56ML ~~LOC~~ SOPN: 80.0000 ug | PEN_INJECTOR | Freq: Every day | SUBCUTANEOUS | 11 refills | Status: DC

## 2019-08-20 ENCOUNTER — Other Ambulatory Visit: Payer: Self-pay

## 2019-08-20 ENCOUNTER — Telehealth: Payer: Self-pay | Admitting: Endocrinology

## 2019-08-20 DIAGNOSIS — M810 Age-related osteoporosis without current pathological fracture: Secondary | ICD-10-CM

## 2019-08-20 MED ORDER — ALENDRONATE SODIUM 70 MG PO TABS: 70.0000 mg | ORAL_TABLET | ORAL | 2 refills | Status: DC

## 2019-08-20 NOTE — Telephone Encounter (Signed)
OK.  Please continue the same medication. I'll see you next time.

## 2019-08-20 NOTE — Telephone Encounter (Signed)
I just spoke with Mr. Ghattas, to schedule Forteo training for his wife.  He said he as just called Ammie, and his wife does not want to take this Forteo, due to side effects.

## 2019-08-20 NOTE — Telephone Encounter (Signed)
alendronate (FOSAMAX) 70 MG tablet 12 tablet 2 08/20/2019    Sig - Route: Take 1 tablet (70 mg total) by mouth once a week. TAKE 1 TABLET BY MOUTH ONCE WEEKLY WITH A FULL GLASS OF WATER ON AN EMPTY STOMACH - Oral   Sent to pharmacy as: alendronate (FOSAMAX) 70 MG tablet   E-Prescribing Status: Receipt confirmed by pharmacy (08/20/2019 12:24 PM EDT)    Carlisle Cater and informed of Dr. Cordelia Pen advice. Verbalized acceptance and understanding.

## 2019-08-20 NOTE — Telephone Encounter (Signed)
Returned husband's call. States his wife is reluctant to undergo injections for her osteoporosis. Would rather only take a pill to treat. Advised I would forward concerns to Dr. Loanne Drilling.

## 2019-08-20 NOTE — Telephone Encounter (Signed)
Pt husband had a question about her bone density and the results  Please contact pt Danielle Gaines @ [phone]

## 2019-08-21 NOTE — Telephone Encounter (Signed)
Returned [employer]. Wanted to know if pt will still be receiving Prolia in addition to Fosamax. Advised her regimen will remain unchanged. The only change that WAS made was to change to [facility]. The reason for this change was due in part to her DEXA results. Since the results reflected that her current regimen wasn't AS beneficial, Forteo was being considered. Since pt has opted NOT to receive Forteo, advised pt resumes previous regimen with the understanding that her regimen did not show an improved outcome of her osteoporosis AND that receiving Prolia is dependent upon the outcome of each PA and insurance coverage. Verbalized acceptance and understanding.

## 2019-08-21 NOTE — Telephone Encounter (Signed)
Patients husband [name]) is calling requesting to speak with Ammie about what was discussed yesterday.   Please Advise, Thanks

## 2019-08-29 DIAGNOSIS — H35359 Cystoid macular degeneration, unspecified eye: Secondary | ICD-10-CM | POA: Diagnosis not present

## 2019-08-31 ENCOUNTER — Telehealth: Payer: Self-pay | Admitting: Endocrinology

## 2019-08-31 NOTE — Telephone Encounter (Signed)
Patient's husband ph# [phone] called to find out when patient should get her Prolia injection. Please call patient or patient's husband at the ph# listed above to advise.

## 2019-08-31 NOTE — Telephone Encounter (Signed)
LM for pt to call back to set up prolia inj No pa needed No copay  Cannot be scheduled before 10/27/2019

## 2019-08-31 NOTE — Telephone Encounter (Signed)
Please advise 

## 2019-09-03 ENCOUNTER — Other Ambulatory Visit: Payer: Self-pay

## 2019-09-03 ENCOUNTER — Ambulatory Visit
Admission: RE | Admit: 2019-09-03 | Discharge: 2019-09-03 | Disposition: A | Discharge status: Home / Self Care | Payer: Medicare Other | Source: Ambulatory Visit | Attending: Family Medicine | Admitting: Family Medicine

## 2019-09-03 ENCOUNTER — Other Ambulatory Visit: Payer: Self-pay | Admitting: Family Medicine

## 2019-09-03 DIAGNOSIS — Z1231 Encounter for screening mammogram for malignant neoplasm of breast: Secondary | ICD-10-CM | POA: Diagnosis not present

## 2019-09-03 NOTE — Telephone Encounter (Signed)
Prolia shot scheduled for Tuesday 10/30/19 at 1:45.

## 2019-09-05 ENCOUNTER — Telehealth: Payer: Self-pay | Admitting: Endocrinology

## 2019-09-05 DIAGNOSIS — Z23 Encounter for immunization: Secondary | ICD-10-CM | POA: Diagnosis not present

## 2019-09-05 DIAGNOSIS — H40053 Ocular hypertension, bilateral: Secondary | ICD-10-CM | POA: Diagnosis not present

## 2019-09-05 DIAGNOSIS — H16223 Keratoconjunctivitis sicca, not specified as Sjogren's, bilateral: Secondary | ICD-10-CM | POA: Diagnosis not present

## 2019-09-05 DIAGNOSIS — H1045 Other chronic allergic conjunctivitis: Secondary | ICD-10-CM | POA: Diagnosis not present

## 2019-09-05 DIAGNOSIS — H353231 Exudative age-related macular degeneration, bilateral, with active choroidal neovascularization: Secondary | ICD-10-CM | POA: Diagnosis not present

## 2019-09-05 DIAGNOSIS — Z9889 Other specified postprocedural states: Secondary | ICD-10-CM | POA: Diagnosis not present

## 2019-09-05 DIAGNOSIS — Z961 Presence of intraocular lens: Secondary | ICD-10-CM | POA: Diagnosis not present

## 2019-09-05 NOTE — Telephone Encounter (Signed)
Abaloparatide (TYMLOS) 3120 MCG/1.56ML SOPN (Discontinued) 1 pen 11 08/17/2019 08/20/2019  Inject 80 mcg into the skin daily. - Subcutaneous  Reason for Discontinue: Patient Preference   As documented on 08/17/19 and 08/20/19, pt does not wish to be treated with Tymlos or Forteo. Will only be treated with Prolia. Dr. Loanne Drilling is aware. Therefore, PA for Tymlos is NOT required.

## 2019-09-05 NOTE — Telephone Encounter (Signed)
[facility] Called to inform they did submit a prior auth for Tymlos to Cover My Meds.  Code: [id number]  [place] # [id number]

## 2019-09-12 DIAGNOSIS — Z961 Presence of intraocular lens: Secondary | ICD-10-CM | POA: Diagnosis not present

## 2019-09-12 DIAGNOSIS — Z9889 Other specified postprocedural states: Secondary | ICD-10-CM | POA: Diagnosis not present

## 2019-09-12 DIAGNOSIS — H02883 Meibomian gland dysfunction of right eye, unspecified eyelid: Secondary | ICD-10-CM | POA: Diagnosis not present

## 2019-09-12 DIAGNOSIS — H3581 Retinal edema: Secondary | ICD-10-CM | POA: Diagnosis not present

## 2019-09-12 DIAGNOSIS — H02886 Meibomian gland dysfunction of left eye, unspecified eyelid: Secondary | ICD-10-CM | POA: Diagnosis not present

## 2019-09-12 DIAGNOSIS — H348332 Tributary (branch) retinal vein occlusion, bilateral, stable: Secondary | ICD-10-CM | POA: Diagnosis not present

## 2019-09-12 DIAGNOSIS — H35371 Puckering of macula, right eye: Secondary | ICD-10-CM | POA: Diagnosis not present

## 2019-09-13 DIAGNOSIS — R040 Epistaxis: Secondary | ICD-10-CM | POA: Diagnosis not present

## 2019-09-13 DIAGNOSIS — J3489 Other specified disorders of nose and nasal sinuses: Secondary | ICD-10-CM | POA: Diagnosis not present

## 2019-09-13 DIAGNOSIS — G43709 Chronic migraine without aura, not intractable, without status migrainosus: Secondary | ICD-10-CM | POA: Diagnosis not present

## 2019-09-13 DIAGNOSIS — H6123 Impacted cerumen, bilateral: Secondary | ICD-10-CM | POA: Insufficient documentation

## 2019-09-13 DIAGNOSIS — H6121 Impacted cerumen, right ear: Secondary | ICD-10-CM | POA: Diagnosis not present

## 2019-09-25 ENCOUNTER — Ambulatory Visit: Payer: Medicare Other

## 2019-09-27 DIAGNOSIS — M5413 Radiculopathy, cervicothoracic region: Secondary | ICD-10-CM | POA: Diagnosis not present

## 2019-10-03 ENCOUNTER — Telehealth: Payer: Self-pay

## 2019-10-03 DIAGNOSIS — Z411 Encounter for cosmetic surgery: Secondary | ICD-10-CM | POA: Diagnosis not present

## 2019-10-03 DIAGNOSIS — L299 Pruritus, unspecified: Secondary | ICD-10-CM | POA: Diagnosis not present

## 2019-10-03 DIAGNOSIS — Z23 Encounter for immunization: Secondary | ICD-10-CM | POA: Diagnosis not present

## 2019-10-03 DIAGNOSIS — L259 Unspecified contact dermatitis, unspecified cause: Secondary | ICD-10-CM | POA: Diagnosis not present

## 2019-10-03 DIAGNOSIS — L309 Dermatitis, unspecified: Secondary | ICD-10-CM | POA: Diagnosis not present

## 2019-10-03 DIAGNOSIS — D1779 Benign lipomatous neoplasm of other sites: Secondary | ICD-10-CM | POA: Diagnosis not present

## 2019-10-03 NOTE — Telephone Encounter (Signed)
Left VM requesting patient call back and schedule Prolia injection-she owes $0 at check-in and can be scheduled after 10/27/19

## 2019-10-09 DIAGNOSIS — H35371 Puckering of macula, right eye: Secondary | ICD-10-CM | POA: Diagnosis not present

## 2019-10-09 DIAGNOSIS — Z961 Presence of intraocular lens: Secondary | ICD-10-CM | POA: Diagnosis not present

## 2019-10-09 DIAGNOSIS — H3581 Retinal edema: Secondary | ICD-10-CM | POA: Diagnosis not present

## 2019-10-09 DIAGNOSIS — H30033 Focal chorioretinal inflammation, peripheral, bilateral: Secondary | ICD-10-CM | POA: Diagnosis not present

## 2019-10-09 DIAGNOSIS — H348332 Tributary (branch) retinal vein occlusion, bilateral, stable: Secondary | ICD-10-CM | POA: Diagnosis not present

## 2019-10-09 DIAGNOSIS — H35342 Macular cyst, hole, or pseudohole, left eye: Secondary | ICD-10-CM | POA: Diagnosis not present

## 2019-10-09 DIAGNOSIS — Z9889 Other specified postprocedural states: Secondary | ICD-10-CM | POA: Diagnosis not present

## 2019-10-09 DIAGNOSIS — H44113 Panuveitis, bilateral: Secondary | ICD-10-CM | POA: Diagnosis not present

## 2019-10-22 ENCOUNTER — Other Ambulatory Visit: Payer: Self-pay

## 2019-10-30 ENCOUNTER — Other Ambulatory Visit: Payer: Self-pay

## 2019-10-30 ENCOUNTER — Ambulatory Visit (INDEPENDENT_AMBULATORY_CARE_PROVIDER_SITE_OTHER): Payer: Medicare Other | Admitting: Nutrition

## 2019-10-30 DIAGNOSIS — M810 Age-related osteoporosis without current pathological fracture: Secondary | ICD-10-CM

## 2019-10-30 NOTE — Progress Notes (Signed)
Per orders of Dr. Carolin Guernsey injection of Prolia 60mg  given today by L.Spagnol,RN. Patient tolerated injection well.

## 2019-12-11 ENCOUNTER — Telehealth: Payer: Self-pay | Admitting: Family Medicine

## 2019-12-11 NOTE — Telephone Encounter (Signed)
I would wait 2 weeks between vaccines although neither are live so there are not strict guidelines on doing closer together.

## 2019-12-11 NOTE — Telephone Encounter (Signed)
Message Routed to PCP CMA 

## 2019-12-11 NOTE — Telephone Encounter (Signed)
Pt has 2nd shingle vaccine on 12-06-2019 at [place] and would like to know how long should she wait to get covid 19 vaccine

## 2019-12-11 NOTE — Telephone Encounter (Signed)
Spoke with the pts husband and informed him of the message below.  Also advised Mr Mandella we do not have the COVID vaccine here and recommended he call [phone] to register and he agreed.

## 2019-12-25 ENCOUNTER — Telehealth: Payer: Self-pay | Admitting: Obstetrics and Gynecology

## 2019-12-25 NOTE — Telephone Encounter (Signed)
Spoke to pt. Pt states having brown spots in vagina that burn with urination and itching. Denies pain and UTI sx. Pt states "while having BM sometimes goes through vagina" Pt states having follow with GI appt on 12/28/2019. Pt would like to be seen first by Dr Talbert Nan for possible yeast infection. Pt declines appt today due to husband out of town and can come tomorrow. OV scheduled 12/26/2019 at 4:30pm. Covid screening negative. Pt agreeable to date and time of appt.   Will route to Dr Talbert Nan for review and will close encounter.

## 2019-12-25 NOTE — Telephone Encounter (Signed)
Patient's spouse Jerry(ok per dpr) says patient is having vaginal issues. Please call home number.

## 2019-12-26 ENCOUNTER — Encounter: Payer: Self-pay | Admitting: Obstetrics and Gynecology

## 2019-12-26 ENCOUNTER — Ambulatory Visit: Payer: Medicare HMO | Admitting: Obstetrics and Gynecology

## 2019-12-26 ENCOUNTER — Other Ambulatory Visit: Payer: Self-pay

## 2019-12-26 VITALS — BP 120/62 | HR 77 | Temp 98.2°F | Ht 61.0 in | Wt 121.0 lb

## 2019-12-26 DIAGNOSIS — N898 Other specified noninflammatory disorders of vagina: Secondary | ICD-10-CM | POA: Diagnosis not present

## 2019-12-26 DIAGNOSIS — N952 Postmenopausal atrophic vaginitis: Secondary | ICD-10-CM | POA: Diagnosis not present

## 2019-12-26 DIAGNOSIS — N760 Acute vaginitis: Secondary | ICD-10-CM | POA: Diagnosis not present

## 2019-12-26 MED ORDER — ESTRADIOL 10 MCG VA TABS: ORAL_TABLET | VAGINAL | 0 refills | Status: DC

## 2019-12-26 NOTE — Progress Notes (Signed)
GYNECOLOGY  VISIT   HPI: 79 y.o.   Married White or Caucasian Not Hispanic or Latino  female   [id number] with No LMP recorded. Patient has had a hysterectomy.   here for vaginal burning and irritation. Patient states passing stool through vagina. She c/o a 3-4 week h/o burning in her vagina. She has noticed a brown d/c coming out of her vagina. Occurs when she is having a bowel movement. She has diarrhea, worse in the last 2 months. She had her colon removed other than a small portion, it has always been watery.   GYNECOLOGIC HISTORY: No LMP recorded. Patient has had a hysterectomy. Contraception:Hyst Menopausal hormone therapy:none        OB History    Gravida  1   Para  1   Term  0   Preterm  0   AB  0   Living  1     SAB  0   TAB  0   Ectopic  0   Multiple  0   Live Births  0              Patient Active Problem List   Diagnosis Date Noted  . Excessive sweating 06/13/2019  . Chronic kidney disease (CKD), stage III (moderate) 08/01/2018  . Anemia, chronic renal failure 08/01/2018  . Chronic pain 11/19/2016  . Orthostatic hypotension 11/19/2016  . MDD (major depressive disorder), recurrent severe, without psychosis ([place]) 04/09/2015  . Osteoporosis 07/24/2014  . Migraine - followed by Dr. Sima Matas in Neurology 05/24/2014  . Anxiety and depression - managed at [facility] 05/24/2014  . GERD (gastroesophageal reflux disease) 05/24/2014  . Macular degeneration - goes to [facility] for this 05/24/2014    Past Medical History:  Diagnosis Date  . Arthritis   . Benzodiazepine dependence ([place])   . Carotid arterial disease ([facility])    mild  . Chronic kidney disease (CKD), stage III (moderate) 08/01/2018   -seeing  Nephrology, Dr. Posey Pronto, thought from [place] per renal notes   . Chronic pain    managed by pain clinic  . Colon abnormality    didn't work right so part of it was removed at [facility]  . Depression    managed by Dr. Toy Care  . Diastolic heart failure ([place])     mild on echo 2016  . GERD (gastroesophageal reflux disease)   . IBS (irritable bowel syndrome)    sees Dr. Earlean Shawl  . Macular degeneration    goes to [facility] for this  . Migraine    managed by neurologist  . Osteoporosis   . Vertigo     Past Surgical History:  Procedure Laterality Date  . ABDOMINAL ADHESION SURGERY    . ABDOMINAL HYSTERECTOMY    . APPENDECTOMY    . CHOLECYSTECTOMY    . COLON SURGERY    . CYSTOSCOPY W/ URETERAL STENT PLACEMENT Right 04/27/2016   Procedure: CYSTOSCOPY WITH RETROGRADE PYELOGRAM/ RIGHT URETERAL STENT PLACEMENT;  Surgeon: Ardis Hughs, MD;  Location: [facility]S;  Service: Urology;  Laterality: Right;  . OVARIAN CYST SURGERY    . SMALL INTESTINE SURGERY      Current Outpatient Medications  Medication Sig Dispense Refill  . acetaminophen (TYLENOL) 500 MG tablet Take 1,000 mg by mouth daily as needed for headache.    . alendronate (FOSAMAX) 70 MG tablet Take 1 tablet (70 mg total) by mouth once a week. TAKE 1 TABLET BY MOUTH ONCE WEEKLY WITH A FULL GLASS OF WATER ON AN  EMPTY STOMACH 12 tablet 2  . amphetamine-dextroamphetamine (ADDERALL XR) 20 MG 24 hr capsule Take 40 mg by mouth. Every morning    . Biotin 1000 MCG tablet Take 1,000 mcg by mouth daily.    . Calcium Carbonate (CALTRATE 600 PO) Take by mouth daily.    . citalopram (CELEXA) 20 MG tablet TK 1 T PO D  2  . cycloSPORINE (RESTASIS) 0.05 % ophthalmic emulsion Place 1 drop into both eyes 2 (two) times daily.     Marland Kitchen denosumab (PROLIA) 60 MG/ML SOSY injection Inject 60 mg into the skin every 6 (six) months.    . DORZOLAMIDE HCL OP Apply 1 drop to eye 2 (two) times a day.    . dorzolamide-timolol (COSOPT) 22.3-6.8 MG/ML ophthalmic solution Place 1 drop into both eyes 2 times daily.    . folic acid (FOLVITE) 1 MG tablet Take by mouth.    . furosemide (LASIX) 40 MG tablet Take 1 tablet (40 mg total) by mouth daily. (from nephrology) 30 tablet 3  . Galcanezumab-gnlm (EMGALITY) 120 MG/ML SOAJ Inject  into the skin.    Marland Kitchen LINZESS 290 MCG CAPS capsule Take 290 mcg by mouth daily before breakfast.   0  . LORazepam (ATIVAN) 0.5 MG tablet Take 0.5 mg by mouth 3 (three) times daily as needed for anxiety.    Marland Kitchen olopatadine (PATANOL) 0.1 % ophthalmic solution 1 drop 2 (two) times daily.    Marland Kitchen omeprazole (PRILOSEC) 40 MG capsule Take 80 mg by mouth daily.   0  . Probiotic Product (ALIGN PO) Take by mouth daily.    . sodium bicarbonate 650 MG tablet TK 1 T PO BID  9  . SUMAtriptan (IMITREX) 100 MG tablet Take 100 mg by mouth every 2 (two) hours as needed for migraine.     . topiramate (TOPAMAX) 100 MG tablet Take 3 tablets at bedtime    . tretinoin (RETIN-A) 0.025 % cream APPLY A PEA SIZED AMOUNT TO FACE ONCE DAILY IN THE EVENING    . UBRELVY 100 MG TABS     . VITAMIN D PO Take by mouth daily.    Marland Kitchen ZENPEP 15000 units CPEP Take 45,000 mg by mouth 2 (two) times daily.   0   No current facility-administered medications for this visit.     ALLERGIES: Sulfa antibiotics  Family History  Problem Relation Age of Onset  . Kidney disease Mother   . Heart disease Mother   . Osteoporosis Mother   . Heart Problems Sister   . Cancer Maternal Aunt        intestinal and liver cancer  . Stroke Maternal Grandmother   . Heart disease Maternal Grandmother   . Stroke Other   . Depression Other        everyone  . Hypercalcemia Neg Hx   . Breast cancer Neg Hx     Social History   Socioeconomic History  . Marital status: Married    Spouse name: Not on file  . Number of children: Not on file  . Years of education: Not on file  . Highest education level: Not on file  Occupational History  . Not on file  Tobacco Use  . Smoking status: Never Smoker  . Smokeless tobacco: Never Used  Substance and Sexual Activity  . Alcohol use: No  . Drug use: No  . Sexual activity: Not Currently    Birth control/protection: Post-menopausal  Other Topics Concern  . Not on file  Social History Narrative  Work or  School: none      Home Situation: lives with husband      Spiritual Beliefs:       Lifestyle: no regular exercise; diet is ok         Social Determinants of Radio broadcast assistant Strain:   . Difficulty of Paying Living Expenses: Not on file  Food Insecurity:   . Worried About Charity fundraiser in the Last Year: Not on file  . Ran Out of Food in the Last Year: Not on file  Transportation Needs:   . Lack of Transportation (Medical): Not on file  . Lack of Transportation (Non-Medical): Not on file  Physical Activity:   . Days of Exercise per Week: Not on file  . Minutes of Exercise per Session: Not on file  Stress:   . Feeling of Stress : Not on file  Social Connections:   . Frequency of Communication with Friends and Family: Not on file  . Frequency of Social Gatherings with Friends and Family: Not on file  . Attends Religious Services: Not on file  . Active Member of Clubs or Organizations: Not on file  . Attends Archivist Meetings: Not on file  . Marital Status: Not on file  Intimate Partner Violence:   . Fear of Current or Ex-Partner: Not on file  . Emotionally Abused: Not on file  . Physically Abused: Not on file  . Sexually Abused: Not on file    ROS  PHYSICAL EXAMINATION:    BP 120/62   Pulse 77   Temp 98.2 F (36.8 C)   Ht 5\' 1"  (1.549 m)   Wt 121 lb (54.9 kg)   SpO2 95%   BMI 22.86 kg/m     General appearance: alert, cooperative and appears stated age  Pelvic: External genitalia:  no lesions              Urethra:  normal appearing urethra with no masses, tenderness or lesions              Bartholins and Skenes: normal                 Vagina: atrophic appearing vagina with normal color and discharge, no lesions. No signs of a rectal vaginal fistula              Cervix: absent              Bimanual Exam:  Uterus:  uterus absent              Adnexa: no mass, fullness, tenderness              Rectovaginal: Yes.  .  Confirms.               Anus:  normal sphincter tone, no lesions  Chaperone was present for exam.  ASSESSMENT Vaginal discharge, no abnormal d/c seen on exam. The patient reports brown d/c, same color as her stool. 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     PLAN Affirm sent Start vaginal estrogen Discussed vulvar skin care, information given.  Follow up in one month, if she still feels she has stool coming from her vagina will further evaluate.     An After Visit Summary was printed and given to the patient.

## 2019-12-26 NOTE — Patient Instructions (Signed)
Atrophic Vaginitis  Atrophic vaginitis is a condition in which the tissues that line the vagina become dry and thin. This condition is most common in women who have stopped having regular menstrual periods (are in menopause). This usually starts when a woman is 45-77 years old. That is the time when a woman's estrogen levels begin to drop (decrease). Estrogen is a female hormone. It helps to keep the tissues of the vagina moist. It stimulates the vagina to produce a clear fluid that lubricates the vagina for sexual intercourse. This fluid also protects the vagina from infection. Lack of estrogen can cause the lining of the vagina to get thinner and dryer. The vagina may also shrink in size. It may become less elastic. Atrophic vaginitis tends to get worse over time as a woman's estrogen level drops. What are the causes? This condition is caused by the normal drop in estrogen that happens around the time of menopause. What increases the risk? Certain conditions or situations may lower a woman's estrogen level, leading to a higher risk for atrophic vaginitis. You are more likely to develop this condition if:  You are taking medicines that block estrogen.  You have had your ovaries removed.  You are being treated for cancer with X-ray (radiation) or medicines (chemotherapy).  You have given birth or are breastfeeding.  You are older than age 50.  You smoke. What are the signs or symptoms? Symptoms of this condition include:  Pain, soreness, or bleeding during sexual intercourse (dyspareunia).  Vaginal burning, irritation, or itching.  Pain or bleeding when a speculum is used in a vaginal exam (pelvic exam).  Having burning pain when passing urine.  Vaginal discharge that is brown or yellow. In some cases, there are no symptoms. How is this diagnosed? This condition is diagnosed by taking a medical history and doing a physical exam. This will include a pelvic exam that checks the  vaginal tissues. Though rare, you may also have other tests, including:  A urine test.  A test that checks the acid balance in your vagina (acid balance test). How is this treated? Treatment for this condition depends on how severe your symptoms are. Treatment may include:  Using an over-the-counter vaginal lubricant before sex.  Using a long-acting vaginal moisturizer.  Using low-dose vaginal estrogen for moderate to severe symptoms that do not respond to other treatments. Options include creams, tablets, and inserts (vaginal rings). Before you use a vaginal estrogen, tell your health care provider if you have a history of: ? Breast cancer. ? Endometrial cancer. ? Blood clots. If you are not sexually active and your symptoms are very mild, you may not need treatment. Follow these instructions at home: Medicines  Take over-the-counter and prescription medicines only as told by your health care provider. Do not use herbal or alternative medicines unless your health care provider says that you can.  Use over-the-counter creams, lubricants, or moisturizers for dryness only as directed by your health care provider. General instructions  If your atrophic vaginitis is caused by menopause, discuss all of your menopause symptoms and treatment options with your health care provider.  Do not douche.  Do not use products that can make your vagina dry. These include: ? Scented feminine sprays. ? Scented tampons. ? Scented soaps.  Vaginal intercourse can help to improve blood flow and elasticity of vaginal tissue. If it hurts to have sex, try using a lubricant or moisturizer just before having intercourse. Contact a health care provider if:    Your discharge looks different than normal.  Your vagina has an unusual smell.  You have new symptoms.  Your symptoms do not improve with treatment.  Your symptoms get worse. Summary  Atrophic vaginitis is a condition in which the tissues that  line the vagina become dry and thin. It is most common in women who have stopped having regular menstrual periods (are in menopause).  Treatment options include using vaginal lubricants and low-dose vaginal estrogen.  Contact a health care provider if your vagina has an unusual smell, or if your symptoms get worse or do not improve after treatment. This information is not intended to replace advice given to you by your health care provider. Make sure you discuss any questions you have with your health care provider. Document Revised: 10/28/2017 Document Reviewed: 08/11/2017 Elsevier Patient Education  2020 Elsevier Inc. Vaginitis Vaginitis is a condition in which the vaginal tissue swells and becomes red (inflamed). This condition is most often caused by a change in the normal balance of bacteria and yeast that live in the vagina. This change causes an overgrowth of certain bacteria or yeast, which causes the inflammation. There are different types of vaginitis, but the most common types are:  Bacterial vaginosis.  Yeast infection (candidiasis).  Trichomoniasis vaginitis. This is a sexually transmitted disease (STD).  Viral vaginitis.  Atrophic vaginitis.  Allergic vaginitis. What are the causes? The cause of this condition depends on the type of vaginitis. It can be caused by:  Bacteria (bacterial vaginosis).  Yeast, which is a fungus (yeast infection).  A parasite (trichomoniasis vaginitis).  A virus (viral vaginitis).  Low hormone levels (atrophic vaginitis). Low hormone levels can occur during pregnancy, breastfeeding, or after menopause.  Irritants, such as bubble baths, scented tampons, and feminine sprays (allergic vaginitis). Other factors can change the normal balance of the yeast and bacteria that live in the vagina. These include:  Antibiotic medicines.  Poor hygiene.  Diaphragms, vaginal sponges, spermicides, birth control pills, and intrauterine devices  (IUD).  Sex.  Infection.  Uncontrolled diabetes.  A weakened defense (immune) system. What increases the risk? This condition is more likely to develop in women who:  Smoke.  Use vaginal douches, scented tampons, or scented sanitary pads.  Wear tight-fitting pants.  Wear thong underwear.  Use oral birth control pills or an IUD.  Have sex without a condom.  Have multiple sex partners.  Have an STD.  Frequently use the spermicide nonoxynol-9.  Eat lots of foods high in sugar.  Have uncontrolled diabetes.  Have low estrogen levels.  Have a weakened immune system from an immune disorder or medical treatment.  Are pregnant or breastfeeding. What are the signs or symptoms? Symptoms vary depending on the cause of the vaginitis. Common symptoms include:  Abnormal vaginal discharge. ? The discharge is white, gray, or yellow with bacterial vaginosis. ? The discharge is thick, white, and cheesy with a yeast infection. ? The discharge is frothy and yellow or greenish with trichomoniasis.  A bad vaginal smell. The smell is fishy with bacterial vaginosis.  Vaginal itching, pain, or swelling.  Sex that is painful.  Pain or burning when urinating. Sometimes there are no symptoms. How is this diagnosed? This condition is diagnosed based on your symptoms and medical history. A physical exam, including a pelvic exam, will also be done. You may also have other tests, including:  Tests to determine the pH level (acidity or alkalinity) of your vagina.  A whiff test, to assess the odor   that results when a sample of your vaginal discharge is mixed with a potassium hydroxide solution.  Tests of vaginal fluid. A sample will be examined under a microscope. How is this treated? Treatment varies depending on the type of vaginitis you have. Your treatment may include:  Antibiotic creams or pills to treat bacterial vaginosis and trichomoniasis.  Antifungal medicines, such as  vaginal creams or suppositories, to treat a yeast infection.  Medicine to ease discomfort if you have viral vaginitis. Your sexual partner should also be treated.  Estrogen delivered in a cream, pill, suppository, or vaginal ring to treat atrophic vaginitis. If vaginal dryness occurs, lubricants and moisturizing creams may help. You may need to avoid scented soaps, sprays, or douches.  Stopping use of a product that is causing allergic vaginitis. Then using a vaginal cream to treat the symptoms. Follow these instructions at home: Lifestyle  Keep your genital area clean and dry. Avoid soap, and only rinse the area with water.  Do not douche or use tampons until your health care provider says it is okay to do so. Use sanitary pads, if needed.  Do not have sex until your health care provider approves. When you can return to sex, practice safe sex and use condoms.  Wipe from front to back. This avoids the spread of bacteria from the rectum to the vagina. General instructions  Take over-the-counter and prescription medicines only as told by your health care provider.  If you were prescribed an antibiotic medicine, take or use it as told by your health care provider. Do not stop taking or using the antibiotic even if you start to feel better.  Keep all follow-up visits as told by your health care provider. This is important. How is this prevented?  Use mild, non-scented products. Do not use things that can irritate the vagina, such as fabric softeners. Avoid the following products if they are scented: ? Feminine sprays. ? Detergents. ? Tampons. ? Feminine hygiene products. ? Soaps or bubble baths.  Let air reach your genital area. ? Wear cotton underwear to reduce moisture buildup. ? Avoid wearing underwear while you sleep. ? Avoid wearing tight pants and underwear or nylons without a cotton panel. ? Avoid wearing thong underwear.  Take off any wet clothing, such as bathing suits, as  soon as possible.  Practice safe sex and use condoms. Contact a health care provider if:  You have abdominal pain.  You have a fever.  You have symptoms that last for more than 2-3 days. Get help right away if:  You have a fever and your symptoms suddenly get worse. Summary  Vaginitis is a condition in which the vaginal tissue becomes inflamed.This condition is most often caused by a change in the normal balance of bacteria and yeast that live in the vagina.  Treatment varies depending on the type of vaginitis you have.  Do not douche, use tampons , or have sex until your health care provider approves. When you can return to sex, practice safe sex and use condoms. This information is not intended to replace advice given to you by your health care provider. Make sure you discuss any questions you have with your health care provider. Document Revised: 10/28/2017 Document Reviewed: 12/21/2016 Elsevier Patient Education  2020 Elsevier Inc.  

## 2019-12-27 ENCOUNTER — Other Ambulatory Visit: Payer: Self-pay

## 2019-12-27 ENCOUNTER — Encounter: Payer: Self-pay | Admitting: Obstetrics and Gynecology

## 2019-12-27 DIAGNOSIS — N952 Postmenopausal atrophic vaginitis: Secondary | ICD-10-CM

## 2019-12-27 LAB — VAGINITIS/VAGINOSIS, DNA PROBE
Candida Species: NEGATIVE
Gardnerella vaginalis: NEGATIVE
Trichomonas vaginosis: NEGATIVE

## 2019-12-27 MED ORDER — ESTRADIOL 10 MCG VA TABS: ORAL_TABLET | VAGINAL | 0 refills | Status: DC

## 2019-12-27 NOTE — Telephone Encounter (Signed)
Received faxed request for 90-day supply for the following:  Medication refill request: Estradiol 68mcg tab(Requesting #25 Tablets instead of #24 Tablets) Last AEX: Last ov 12-26-19 Next AEX: Next ov 01-30-20 Last MMG (if hormonal medication request):09-03-19 [id number] Refill authorized: Refill if appropriate

## 2019-12-28 DIAGNOSIS — N898 Other specified noninflammatory disorders of vagina: Secondary | ICD-10-CM | POA: Diagnosis not present

## 2019-12-28 DIAGNOSIS — Z9049 Acquired absence of other specified parts of digestive tract: Secondary | ICD-10-CM | POA: Diagnosis not present

## 2019-12-28 DIAGNOSIS — K5900 Constipation, unspecified: Secondary | ICD-10-CM | POA: Diagnosis not present

## 2019-12-28 DIAGNOSIS — R1010 Upper abdominal pain, unspecified: Secondary | ICD-10-CM | POA: Diagnosis not present

## 2020-01-15 DIAGNOSIS — H35371 Puckering of macula, right eye: Secondary | ICD-10-CM | POA: Diagnosis not present

## 2020-01-15 DIAGNOSIS — H35342 Macular cyst, hole, or pseudohole, left eye: Secondary | ICD-10-CM | POA: Diagnosis not present

## 2020-01-15 DIAGNOSIS — H30033 Focal chorioretinal inflammation, peripheral, bilateral: Secondary | ICD-10-CM | POA: Diagnosis not present

## 2020-01-15 DIAGNOSIS — H348332 Tributary (branch) retinal vein occlusion, bilateral, stable: Secondary | ICD-10-CM | POA: Diagnosis not present

## 2020-01-15 DIAGNOSIS — Z9889 Other specified postprocedural states: Secondary | ICD-10-CM | POA: Diagnosis not present

## 2020-01-15 DIAGNOSIS — H44113 Panuveitis, bilateral: Secondary | ICD-10-CM | POA: Diagnosis not present

## 2020-01-15 DIAGNOSIS — Z961 Presence of intraocular lens: Secondary | ICD-10-CM | POA: Diagnosis not present

## 2020-01-15 DIAGNOSIS — H3581 Retinal edema: Secondary | ICD-10-CM | POA: Diagnosis not present

## 2020-01-15 DIAGNOSIS — H18599 Other hereditary corneal dystrophies, unspecified eye: Secondary | ICD-10-CM | POA: Diagnosis not present

## 2020-01-16 ENCOUNTER — Other Ambulatory Visit: Payer: Self-pay

## 2020-01-16 ENCOUNTER — Telehealth: Payer: Self-pay | Admitting: Family Medicine

## 2020-01-16 ENCOUNTER — Encounter: Payer: Self-pay | Admitting: Family Medicine

## 2020-01-16 ENCOUNTER — Telehealth (INDEPENDENT_AMBULATORY_CARE_PROVIDER_SITE_OTHER): Payer: Medicare HMO | Admitting: Family Medicine

## 2020-01-16 VITALS — BP 112/48 | HR 69

## 2020-01-16 DIAGNOSIS — E559 Vitamin D deficiency, unspecified: Secondary | ICD-10-CM

## 2020-01-16 DIAGNOSIS — R42 Dizziness and giddiness: Secondary | ICD-10-CM | POA: Diagnosis not present

## 2020-01-16 DIAGNOSIS — I959 Hypotension, unspecified: Secondary | ICD-10-CM | POA: Diagnosis not present

## 2020-01-16 DIAGNOSIS — R5383 Other fatigue: Secondary | ICD-10-CM

## 2020-01-16 DIAGNOSIS — D539 Nutritional anemia, unspecified: Secondary | ICD-10-CM | POA: Diagnosis not present

## 2020-01-16 DIAGNOSIS — J0100 Acute maxillary sinusitis, unspecified: Secondary | ICD-10-CM | POA: Diagnosis not present

## 2020-01-16 MED ORDER — DOXYCYCLINE HYCLATE 100 MG PO TABS: 100.0000 mg | ORAL_TABLET | Freq: Two times a day (BID) | ORAL | 0 refills | Status: AC

## 2020-01-16 NOTE — Telephone Encounter (Signed)
What is current blood pressure? Just concerned if she is too tired to get dressed, that there may be more going on. Could they take vitals and do virtual right now since we just got opening? I would like blood pressure, temp, heart rate if they are able to get these.

## 2020-01-16 NOTE — Telephone Encounter (Signed)
Spoke with the pt, entered vitals into office notes and the pt was advised and agreed to log in to [facility] now for an appt.

## 2020-01-16 NOTE — Telephone Encounter (Signed)
Left a message at the pts cell number to return my call. 

## 2020-01-16 NOTE — Progress Notes (Signed)
Virtual Visit via Video Note  I connected with Danielle Gaines  on 01/16/20 at  3:15 PM EST by a video enabled telemedicine application and verified that I am speaking with the correct person using two identifiers.  Location patient: home Location provider:work or home office Persons participating in the virtual visit: patient, provider  I discussed the limitations of evaluation and management by telemedicine and the availability of in person appointments. The patient expressed understanding and agreed to proceed.   Danielle Gaines DOB: [birth year] Encounter date: 01/16/2020  This is a 79 y.o. female who presents with Chief Complaint  Patient presents with  . patient complains of low BP and weakness x1 day    x2-3 months  . Dizziness    x2-3 months    History of present illness: Feeling weak for 2-3 months. bp has been very low. Didn't think anything about it because she is prone to low bp, but usually not Q000111Q diastolic. Saw gyn and bp was 50 diastolic. At eye doc yesterday bp was in Q000111Q diastolic. Usually top number is around 120. Yesterday was 123/50.   Feels a little spinning when she gets up; vision is not good.   Appetite is good. No problem with eating.   Keeping up with fluids. Drinks plenty of water - at least 6 16 oz daily. Has always been very thirsty and always been a big water drinking.   Does have a headache all the time. Takes imitrex for migraines; takes monthly injection; takes emgality.   Does have a cough. Rarely getting anything up. Also having some soreness in sinuses. Doesn't feel like cough is something bad that worries her. No fevers in last couple of months. Was having night sweats, but none recently. Sinuses are draining and this is worse than baseline.   She always has muscle aches/pains, no worse.   Nephrology put her on lasix to help with edema. She states she is due to see him in next month.   No new chest pain, no shortness of breath.    Does check O2 level and most of time 95-97.   Restless legs are bad at night.   Weight was 120 at visit yesterday.   She is able to do laundry, and clean house, but energy is poor overall.    Allergies  Allergen Reactions  . Sulfa Antibiotics Swelling and Other (See Comments)    Reaction:  Unspecified swelling reaction    Current Meds  Medication Sig  . acetaminophen (TYLENOL) 500 MG tablet Take 1,000 mg by mouth daily as needed for headache.  . alendronate (FOSAMAX) 70 MG tablet Take 1 tablet (70 mg total) by mouth once a week. TAKE 1 TABLET BY MOUTH ONCE WEEKLY WITH A FULL GLASS OF WATER ON AN EMPTY STOMACH  . amphetamine-dextroamphetamine (ADDERALL XR) 20 MG 24 hr capsule Take 40 mg by mouth. Every morning  . Biotin 1000 MCG tablet Take 1,000 mcg by mouth daily.  . Calcium Carbonate (CALTRATE 600 PO) Take by mouth daily.  . citalopram (CELEXA) 20 MG tablet TK 1 T PO D  . cycloSPORINE (RESTASIS) 0.05 % ophthalmic emulsion Place 1 drop into both eyes 2 (two) times daily.   Marland Kitchen denosumab (PROLIA) 60 MG/ML SOSY injection Inject 60 mg into the skin every 6 (six) months.  . DORZOLAMIDE HCL OP Apply 1 drop to eye 2 (two) times a day.  . dorzolamide-timolol (COSOPT) 22.3-6.8 MG/ML ophthalmic solution Place 1 drop into both eyes 2 times daily.  Marland Kitchen  Estradiol 10 MCG TABS vaginal tablet One tablet qhs x 7 days then change to 2 x a week at hs.  . folic acid (FOLVITE) 1 MG tablet Take by mouth.  . furosemide (LASIX) 40 MG tablet Take 1 tablet (40 mg total) by mouth daily. (from nephrology)  . Galcanezumab-gnlm (EMGALITY) 120 MG/ML SOAJ Inject into the skin.  Marland Kitchen LINZESS 290 MCG CAPS capsule Take 290 mcg by mouth daily before breakfast.   . LORazepam (ATIVAN) 0.5 MG tablet Take 0.5 mg by mouth 3 (three) times daily as needed for anxiety.  Marland Kitchen olopatadine (PATANOL) 0.1 % ophthalmic solution 1 drop 2 (two) times daily.  Marland Kitchen omeprazole (PRILOSEC) 40 MG capsule Take 80 mg by mouth daily.   . Probiotic  Product (ALIGN PO) Take by mouth daily.  . sodium bicarbonate 650 MG tablet TK 1 T PO BID  . SUMAtriptan (IMITREX) 100 MG tablet Take 100 mg by mouth every 2 (two) hours as needed for migraine.   . topiramate (TOPAMAX) 100 MG tablet Take 3 tablets at bedtime  . tretinoin (RETIN-A) 0.025 % cream APPLY A PEA SIZED AMOUNT TO FACE ONCE DAILY IN THE EVENING  . UBRELVY 100 MG TABS   . VITAMIN D PO Take by mouth daily.  Marland Kitchen ZENPEP 15000 units CPEP Take 45,000 mg by mouth 2 (two) times daily.     Review of Systems  Constitutional: Positive for fatigue. Negative for chills and fever.  HENT: Positive for congestion, postnasal drip, sinus pressure and sinus pain.   Respiratory: Positive for cough. Negative for chest tightness, shortness of breath and wheezing.   Cardiovascular: Negative for chest pain, palpitations and leg swelling.  Musculoskeletal: Positive for arthralgias.  Neurological: Positive for dizziness and headaches.    Objective:  BP (!) 112/48   Pulse 69       BP Readings from Last 3 Encounters:  01/16/20 (!) 112/48  12/26/19 120/62  06/13/19 122/80   Wt Readings from Last 3 Encounters:  12/26/19 121 lb (54.9 kg)  06/13/19 119 lb (54 kg)  12/21/18 113 lb 6.4 oz (51.4 kg)    EXAM:  GENERAL: alert, oriented, appears well and in no acute distress  HEENT: atraumatic, conjunctiva clear, no obvious abnormalities on inspection of external nose and ears  NECK: normal movements of the head and neck  LUNGS: on inspection no signs of respiratory distress, breathing rate appears normal, no obvious gross SOB, gasping or wheezing  CV: no obvious cyanosis  MS: moves all visible extremities without noticeable abnormality  PSYCH/NEURO: pleasant and cooperative, no obvious depression or anxiety, speech and thought processing grossly intact   Assessment/Plan  1. Subacute maxillary sinusitis We are going to cover with doxy due to maxillary pain, ongoing symptoms. Fatigue may be  related to ongoing infection.  2. Vitamin D deficiency - VITAMIN D 25 Hydroxy (Vit-D Deficiency, Fractures); Future  3. Other fatigue Difficult to pinpoint with current sx. Unsure if purely related to hypotension, or if there is other underlying etiology.  She has not had blood work done in some time (last was done in the fall with nephrology), so I think this is a good place to start.  I would also like to have her in the office to do an EKG. - CBC with Differential/Platelet; Future - Comprehensive metabolic panel; Future - TSH; Future - Vitamin B12; Future - Folate; Future  4. Hypotension, unspecified hypotension type She has always had low pressures, and history of orthostatic hypotension.  Keep up with  fluids.  May need to return to cardiology for evaluation if cause for hypotension is not identified. - CBC with Differential/Platelet; Future - Comprehensive metabolic panel; Future  5. Dizziness Uncertain if related to ongoing sinus symptoms or related to hypotension.  She does have some baseline dizziness with standing, but feels that current symptoms are slightly worse.  She is taking time with changes in position and trying to limit activities.  6. Nutritional anemia, unspecified  - Vitamin B12; Future - Folate; Future  I am going to see if we can get her into the office on Friday perhaps for blood work, EKG, and chest x-ray.  She does have some sick symptoms, but these have been going on for 2 to 3 months, so I feel the Covid risk is really low.  I will touch base with office staff as well as lab/x-ray staff to gauge comfort level.  If we cannot see her in the office, perhaps we can arrange for respiratory clinic evaluation where blood work can be done.  She did agree to go to the ER if any worsening of symptoms in the meanwhile.  She declined coming into the office today and feels that she does not have the energy.  Getting up and going out for eye injections yesterday made her very  tired.  She prefers not to come into the office tomorrow since regular provider will not be here.  I discussed the assessment and treatment plan with the patient. The patient was provided an opportunity to ask questions and all were answered. The patient agreed with the plan and demonstrated an understanding of the instructions.   The patient was advised to call back or seek an in-person evaluation if the symptoms worsen or if the condition fails to improve as anticipated.  I provided 23 minutes of non-face-to-face time during this encounter.   Micheline Rough, MD

## 2020-01-16 NOTE — Telephone Encounter (Signed)
Spoke with Mr Cardosi and explained to him that Dr Maudie Mercury is never in the office and Dr Ethlyn Gallery would be able to see her in the office when openings are available.  Mr Golly stated the pt is feeling bad today, weak and her blood pressure has been low since was seen by the eye doctor yesterday and her migraines are worse.  I offered an in office appt today as Dr Ethlyn Gallery approved as a work in and he stated the pt declined as she is too tired to get dressed.  Offered virtual visit and pt agreed to this at 3:30pm today.  Message sent to PCP.

## 2020-01-16 NOTE — Telephone Encounter (Signed)
Pt husband called to schedule an in office visit for pt with Dr. Maudie Mercury. Explained that Dr. Maudie Mercury is virtual only and he told me that Jo'anne told him that if Danielle Gaines wants to see Dr. Maudie Mercury in office she can. Please call pt to assist or schedule an in office visit.

## 2020-01-17 ENCOUNTER — Telehealth: Payer: Self-pay | Admitting: *Deleted

## 2020-01-17 NOTE — Telephone Encounter (Signed)
-----   Message from Alverda Skeans, RN sent at 01/16/2020  4:49 PM EST ----- D/t Nell Range the patient would need to be evaluated in the respiratory clinic.  ----- Message ----- From: Agnes Lawrence, CMA Sent: 01/16/2020   1:23 PM EST To: Alverda Skeans, RN  Can you please help with this? Sherryle Lis  ----- Message ----- From: Caren Macadam, MD Sent: 01/16/2020  12:39 PM EST To: Agnes Lawrence, CMA  Please help me arrange: I would like her to have labs, cxr, and ekg on Friday morning. She does have some sinus congestion and an occasional cough, but has been feeling this way for 2+ months, so I do not feel that this is an acute covid risk. If she is not able to be seen by me due to symptoms, then perhaps we can get her in with respiratory clinic Friday? She does not feel she can make it for appointment today. Please let me know. OK to consult with Jamie/Aneyah first. Visit Friday can be more like nurse visit if allowed and do it prior to start of scheduled day if possible?

## 2020-01-17 NOTE — Telephone Encounter (Signed)
Respiratory clinic is full for Friday and there are openings available for Monday.  Message sent to PCP.

## 2020-01-18 ENCOUNTER — Telehealth: Payer: Self-pay | Admitting: Obstetrics and Gynecology

## 2020-01-18 NOTE — Telephone Encounter (Signed)
Left message to call Tyberius Ryner, RN at [facility] [phone].   

## 2020-01-18 NOTE — Telephone Encounter (Signed)
If she is feeling the same; then ok to wait for resp clinic to be seen (next week if that is all available?). Just explain with current symptoms we cannot see her in office.   Please check with her today though; we gave antibiotics so perhaps she is feeling better? If so I can touch base with her and come up with different plan.

## 2020-01-18 NOTE — Telephone Encounter (Signed)
Spoke with [name] and informed him of the message below.  [name] stated the pt is feeling the same, is taking the antibiotic, eating OK and he will give her Pedialyte.  Appt scheduled for the respiratory clinic on 2/22.  Stated BP readings are as follows: 2/17-PM--101/50-pulse 63 and 108/49-pulse 68 2/18-AM--109/56-pulse 64 2/19-6:45 AM--107/42-pulse 63  Message sent to PCP.

## 2020-01-18 NOTE — Telephone Encounter (Signed)
Patient's husband [name] called (ok per dpr) calling regarding a prescription. [phone] [phone]

## 2020-01-18 NOTE — Telephone Encounter (Signed)
Noted  

## 2020-01-18 NOTE — Telephone Encounter (Signed)
Spoke with patients spouse, [name], ok per dpr. [name] is requesting refill of Estradiol vag tab. Marshell Garfinkel, Rx sent on 12/27/19 for 3 mo supply. [name] states he only picked up 1 month supply. Marshell Garfinkel patient should have 2 refills on file, RN will call to confirm and return call to provide update. Spouse thankful.  Call placed to [facility], spoke with Randall Hiss. Was advised partial Rx was dispensed on 1/28, medication was not in stock, remaining Rx dispensed on 1/29, total tabs dispensed were #24.   Call returned to spouse, [name], advised of message from pharmacy. [name] unable to confirm how often patient is using vaginal estrogen, "medication is out". RN asked if I could call patient to further discuss, spouse agreeable.   Call placed to patient. Patient reports she has been using vaginal estradiol tabs nightly, states she was not aware she was supposed to switch to twice weekly. Reviewed instructions with patient. Patient denies any vaginal bleeding. Reports vaginal d/c is improved, not resolved. Patient reports vaginal is sore inside and out. Advised patient OV recommended for further evaluation prior to refill, patient agreeable. OV scheduled for 2/24 at 10am with Dr. Talbert Nan.   Routing to provider for final review. Patient is agreeable to disposition. Will close encounter.

## 2020-01-21 ENCOUNTER — Ambulatory Visit (INDEPENDENT_AMBULATORY_CARE_PROVIDER_SITE_OTHER): Payer: Medicare HMO

## 2020-01-21 ENCOUNTER — Ambulatory Visit (INDEPENDENT_AMBULATORY_CARE_PROVIDER_SITE_OTHER): Payer: Medicare HMO | Admitting: Family Medicine

## 2020-01-21 ENCOUNTER — Encounter: Payer: Self-pay | Admitting: Family Medicine

## 2020-01-21 VITALS — Temp 98.6°F | Wt 121.1 lb

## 2020-01-21 DIAGNOSIS — J309 Allergic rhinitis, unspecified: Secondary | ICD-10-CM

## 2020-01-21 DIAGNOSIS — R5383 Other fatigue: Secondary | ICD-10-CM

## 2020-01-21 DIAGNOSIS — E559 Vitamin D deficiency, unspecified: Secondary | ICD-10-CM | POA: Diagnosis not present

## 2020-01-21 DIAGNOSIS — D539 Nutritional anemia, unspecified: Secondary | ICD-10-CM

## 2020-01-21 DIAGNOSIS — R0602 Shortness of breath: Secondary | ICD-10-CM | POA: Diagnosis not present

## 2020-01-21 DIAGNOSIS — I959 Hypotension, unspecified: Secondary | ICD-10-CM

## 2020-01-21 DIAGNOSIS — J42 Unspecified chronic bronchitis: Secondary | ICD-10-CM | POA: Diagnosis not present

## 2020-01-21 DIAGNOSIS — R0789 Other chest pain: Secondary | ICD-10-CM | POA: Diagnosis not present

## 2020-01-21 MED ORDER — IPRATROPIUM BROMIDE 0.03 % NA SOLN: 2.0000 | Freq: Three times a day (TID) | NASAL | 0 refills | Status: DC

## 2020-01-21 NOTE — Patient Instructions (Addendum)
Go to ER or Urgent Care if dizziness or weakness worsens. You may require fluid hydration if symptoms worsen. Call cardiology office to schedule a follow-up regarding symptoms of shortness of breath and unintentional weigh gain. Your PCP will follow-up with you regarding lab results.     Shortness of Breath, Adult Shortness of breath means you have trouble breathing. Shortness of breath could be a sign of a medical problem. Follow these instructions at home:   Watch for any changes in your symptoms.  Do not use any products that contain nicotine or tobacco, such as cigarettes, e-cigarettes, and chewing tobacco.  Do not smoke. Smoking can cause shortness of breath. If you need help to quit smoking, ask your doctor.  Avoid things that can make it harder to breathe, such as: ? Mold. ? Dust. ? Air pollution. ? Chemical smells. ? Things that can cause allergy symptoms (allergens), if you have allergies.  Keep your living space clean. Use products that help remove mold and dust.  Rest as needed. Slowly return to your normal activities.  Take over-the-counter and prescription medicines only as told by your doctor. This includes oxygen therapy and inhaled medicines.  Keep all follow-up visits as told by your doctor. This is important. Contact a doctor if:  Your condition does not get better as soon as expected.  You have a hard time doing your normal activities, even after you rest.  You have new symptoms. Get help right away if:  Your shortness of breath gets worse.  You have trouble breathing when you are resting.  You feel light-headed or you pass out (faint).  You have a cough that is not helped by medicines.  You cough up blood.  You have pain with breathing.  You have pain in your chest, arms, shoulders, or belly (abdomen).  You have a fever.  You cannot walk up stairs.  You cannot exercise the way you normally do. These symptoms may represent a serious  problem that is an emergency. Do not wait to see if the symptoms will go away. Get medical help right away. Call your local emergency services (911 in the [place] Do not drive yourself to the hospital. Summary  Shortness of breath is when you have trouble breathing enough air. It can be a sign of a medical problem.  Avoid things that make it hard for you to breathe, such as smoking, pollution, mold, and dust.  Watch for any changes in your symptoms. Contact your doctor if you do not get better or you get worse. This information is not intended to replace advice given to you by your health care provider. Make sure you discuss any questions you have with your health care provider. Document Revised: 04/17/2018 Document Reviewed: 04/17/2018 Elsevier Patient Education  [place].    Orthostatic Hypotension Blood pressure is a measurement of how strongly, or weakly, your blood is pressing against the walls of your arteries. Orthostatic hypotension is a sudden drop in blood pressure that happens when you quickly change positions, such as when you get up from sitting or lying down. Arteries are blood vessels that carry blood from your heart throughout your body. When blood pressure is too low, you may not get enough blood to your brain or to the rest of your organs. This can cause weakness, light-headedness, rapid heartbeat, and fainting. This can last for just a few seconds or for up to a few minutes. Orthostatic hypotension is usually not a serious problem. However, if  it happens frequently or gets worse, it may be a sign of something more serious. What are the causes? This condition may be caused by:  Sudden changes in posture, such as standing up quickly after you have been sitting or lying down.  Blood loss.  Loss of body fluids (dehydration).  Heart problems.  Hormone (endocrine) problems.  Pregnancy.  Severe infection.  Lack of certain nutrients.  Severe allergic reactions  (anaphylaxis).  Certain medicines, such as blood pressure medicine or medicines that make the body lose excess fluids (diuretics). Sometimes, this condition can be caused by not taking medicine as directed, such as taking too much of a certain medicine. What increases the risk? The following factors may make you more likely to develop this condition:  Age. Risk increases as you get older.  Conditions that affect the heart or the central nervous system.  Taking certain medicines, such as blood pressure medicine or diuretics.  Being pregnant. What are the signs or symptoms? Symptoms of this condition may include:  Weakness.  Light-headedness.  Dizziness.  Blurred vision.  Fatigue.  Rapid heartbeat.  Fainting, in severe cases. How is this diagnosed? This condition is diagnosed based on:  Your medical history.  Your symptoms.  Your blood pressure measurement. Your health care provider will check your blood pressure when you are: ? Lying down. ? Sitting. ? Standing. A blood pressure reading is recorded as two numbers, such as "120 over 80" (or 120/80). The first ("top") number is called the systolic pressure. It is a measure of the pressure in your arteries as your heart beats. The second ("bottom") number is called the diastolic pressure. It is a measure of the pressure in your arteries when your heart relaxes between beats. Blood pressure is measured in a unit called mm Hg. Healthy blood pressure for most adults is 120/80. If your blood pressure is below 90/60, you may be diagnosed with hypotension. Other information or tests that may be used to diagnose orthostatic hypotension include:  Your other vital signs, such as your heart rate and temperature.  Blood tests.  Tilt table test. For this test, you will be safely secured to a table that moves you from a lying position to an upright position. Your heart rhythm and blood pressure will be monitored during the test. How is  this treated? This condition may be treated by:  Changing your diet. This may involve eating more salt (sodium) or drinking more water.  Taking medicines to raise your blood pressure.  Changing the dosage of certain medicines you are taking that might be lowering your blood pressure.  Wearing compression stockings. These stockings help to prevent blood clots and reduce swelling in your legs. In some cases, you may need to go to the hospital for:  Fluid replacement. This means you will receive fluids through an IV.  Blood replacement. This means you will receive donated blood through an IV (transfusion).  Treating an infection or heart problems, if this applies.  Monitoring. You may need to be monitored while medicines that you are taking wear off. Follow these instructions at home: Eating and drinking   Drink enough fluid to keep your urine pale yellow.  Eat a healthy diet, and follow instructions from your health care provider about eating or drinking restrictions. A healthy diet includes: ? Fresh fruits and vegetables. ? Whole grains. ? Lean meats. ? Low-fat dairy products.  Eat extra salt only as directed. Do not add extra salt to your diet unless  your health care provider told you to do that.  Eat frequent, small meals.  Avoid standing up suddenly after eating. Medicines  Take over-the-counter and prescription medicines only as told by your health care provider. ? Follow instructions from your health care provider about changing the dosage of your current medicines, if this applies. ? Do not stop or adjust any of your medicines on your own. General instructions   Wear compression stockings as told by your health care provider.  Get up slowly from lying down or sitting positions. This gives your blood pressure a chance to adjust.  Avoid hot showers and excessive heat as directed by your health care provider.  Return to your normal activities as told by your health  care provider. Ask your health care provider what activities are safe for you.  Do not use any products that contain nicotine or tobacco, such as cigarettes, e-cigarettes, and chewing tobacco. If you need help quitting, ask your health care provider.  Keep all follow-up visits as told by your health care provider. This is important. Contact a health care provider if you:  Vomit.  Have diarrhea.  Have a fever for more than 2-3 days.  Feel more thirsty than usual.  Feel weak and tired. Get help right away if you:  Have chest pain.  Have a fast or irregular heartbeat.  Develop numbness in any part of your body.  Cannot move your arms or your legs.  Have trouble speaking.  Become sweaty or feel light-headed.  Faint.  Feel short of breath.  Have trouble staying awake.  Feel confused. Summary  Orthostatic hypotension is a sudden drop in blood pressure that happens when you quickly change positions.  Orthostatic hypotension is usually not a serious problem.  It is diagnosed by having your blood pressure taken lying down, sitting, and then standing.  It may be treated by changing your diet or adjusting your medicines. This information is not intended to replace advice given to you by your health care provider. Make sure you discuss any questions you have with your health care provider. Document Revised: 05/11/2018 Document Reviewed: 05/11/2018 Elsevier Patient Education  [place].

## 2020-01-21 NOTE — Progress Notes (Signed)
Patient ID: Danielle Gaines, female    DOB: 1941/09/21, 79 y.o.   MRN: [id number]  PCP: Caren Macadam, MD    Subjective:  HPI Danielle Gaines is a 79 y.o. female presents to [facility] Res[facility] clinic for evaluation of symptoms of chest pain, shortness of breath, edema, generalized weakness, dizziness and hypotension.  Patient endorses that chest pain, shortness of breath, cough, and edema has been long-standing and ongoing for 3 months. No prior COVID-19 infection or exposure. Patient is concern as she is experiencing generalized weakness, persistent fluctuations of her weight, shortness of breath, and non-productive cough. She has checked her blood pressure routinely at home and has noticed low BP readings. Her BP on arrival today is 106/70 sitting. She has had episodes of dizziness. Endorses plenty of fluid intake. She is followed by nephrology and was placed on furosemide due to swelling in her legs and she doesn't feel diuretic is helping with swelling as it did when treatment was initiated. 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 She is concern that her symptoms could be heart failure related as her mother died of CHF. In review of chart, patient has no documented history of CHF listed on problem list, although notes that she has been seen by a cardiologist in the past she thinks. She is reports chest heaviness oppose to overt chest pain with an associated cough which has been present for 3 months. She denies any new weakness, palpitations, or numbness or tingling of extremities.   Review of Systems Pertinent negatives listed in HPI Patient Active Problem List   Diagnosis Date Noted  . Excessive sweating 06/13/2019  . Chronic kidney disease (CKD), stage III (moderate) 08/01/2018  . Anemia, chronic renal failure 08/01/2018  . Chronic pain 11/19/2016  . Orthostatic hypotension 11/19/2016  . MDD (major depressive disorder), recurrent severe, without psychosis ([place]) 04/09/2015  .  Osteoporosis 07/24/2014  . Migraine - followed by Dr. Sima Matas in Neurology 05/24/2014  . Anxiety and depression - managed at [facility] 05/24/2014  . GERD (gastroesophageal reflux disease) 05/24/2014  . Macular degeneration - goes to [facility] for this 05/24/2014      Prior to Admission medications   Medication Sig Start Date End Date Taking? Authorizing Provider  acetaminophen (TYLENOL) 500 MG tablet Take 1,000 mg by mouth daily as needed for headache.   Yes [provider]  alendronate (FOSAMAX) 70 MG tablet Take 1 tablet (70 mg total) by mouth once a week. TAKE 1 TABLET BY MOUTH ONCE WEEKLY WITH A FULL GLASS OF WATER ON AN EMPTY STOMACH 08/20/19  Yes Renato Shin, MD  Biotin 1000 MCG tablet Take 1,000 mcg by mouth daily.   Yes [provider]  Calcium Carbonate (CALTRATE 600 PO) Take by mouth daily.   Yes [provider]  cycloSPORINE (RESTASIS) 0.05 % ophthalmic emulsion Place 1 drop into both eyes 2 (two) times daily.    Yes [provider]  denosumab (PROLIA) 60 MG/ML SOSY injection Inject 60 mg into the skin every 6 (six) months.   Yes [provider]  dorzolamide-timolol (COSOPT) 22.3-6.8 MG/ML ophthalmic solution Place 1 drop into both eyes 2 times daily. 10/09/19  Yes [provider]  doxycycline (VIBRA-TABS) 100 MG tablet Take 1 tablet (100 mg total) by mouth 2 (two) times daily for 7 days. 01/16/20 01/23/20 Yes Caren Macadam, MD  Estradiol 10 MCG TABS vaginal tablet One tablet qhs x 7 days then change to 2 x a week at hs. 12/27/19  Yes Talbert Nan,  Francesca Jewett, MD  folic acid (FOLVITE) 1 MG tablet Take by mouth. 12/21/19  Yes [provider]  furosemide (LASIX) 40 MG tablet Take 1 tablet (40 mg total) by mouth daily. (from nephrology) 06/28/19  Yes Koberlein, Steele Berg, MD  Galcanezumab-gnlm (EMGALITY) 120 MG/ML SOAJ Inject into the skin. 06/15/19  Yes [provider]  LINZESS 290 MCG CAPS capsule Take 290 mcg by  mouth daily before breakfast.    Yes [provider]  LORazepam (ATIVAN) 0.5 MG tablet Take 0.5 mg by mouth 3 (three) times daily as needed for anxiety.   Yes [provider]  olopatadine (PATANOL) 0.1 % ophthalmic solution 1 drop 2 (two) times daily.   Yes [provider]  omeprazole (PRILOSEC) 40 MG capsule Take 80 mg by mouth daily.    Yes [provider]  Probiotic Product (ALIGN PO) Take by mouth daily.   Yes [provider]  sodium bicarbonate 650 MG tablet TK 1 T PO BID 07/21/18  Yes [provider]  SUMAtriptan (IMITREX) 100 MG tablet Take 100 mg by mouth every 2 (two) hours as needed for migraine.    Yes [provider]  topiramate (TOPAMAX) 100 MG tablet Take 3 tablets at bedtime   Yes [provider]  UBRELVY 100 MG TABS  12/14/19  Yes [provider]  VITAMIN D PO Take by mouth daily.   Yes [provider]  ZENPEP 15000 units CPEP Take 45,000 mg by mouth 2 (two) times daily.    Yes [provider]  citalopram (CELEXA) 20 MG tablet TK 1 T PO D 07/19/18   [provider]  tretinoin (RETIN-A) 0.025 % cream APPLY A PEA SIZED AMOUNT TO FACE ONCE DAILY IN THE EVENING 10/05/19   [provider]    Past Medical, Surgical Family and Social History reviewed and updated.    Objective:   Today's Vitals   01/21/20 1826  BP: 106/70  Pulse: 73  Temp: 98.6 F (37 C)  SpO2: 96%  Weight: 121 lb 1.3 oz (54.9 kg)     Orthostatic VS for the past 72 hrs (Last 3 readings):  Orthostatic BP Orthostatic Pulse  01/21/20 2011 130/82 67  01/21/20 2010 128/76 61  01/21/20 2009 140/80 60      Wt Readings from Last 3 Encounters:  01/21/20 121 lb 1.3 oz (54.9 kg)  12/26/19 121 lb (54.9 kg)  06/13/19 119 lb (54 kg)   Physical Exam Constitutional:      Appearance: She is underweight. She is not ill-appearing, toxic-appearing or diaphoretic.  HENT:     Head: Normocephalic.      Nose: Congestion present.  Eyes:     Extraocular Movements: Extraocular movements intact.     Pupils: Pupils are equal, round, and reactive to light.  Cardiovascular:     Rate and Rhythm: Normal rate and regular rhythm.     Pulses: Normal pulses.  Pulmonary:     Effort: Pulmonary effort is normal.     Comments: Clear/diminished lung sound  Musculoskeletal:        General: Normal range of motion.     Cervical back: Normal range of motion.     Right lower leg: Edema present.     Left lower leg: Edema present.     Comments: Trace edema   Skin:    General: Skin is warm and dry.  Neurological:     Mental Status: She is alert and oriented to person, place, and time.  Sensory: Sensation is intact.     Motor: Motor function is intact.  Psychiatric:        Attention and Perception: Attention normal.        Mood and Affect: Mood is anxious.        Behavior: Behavior normal. Behavior is cooperative.     Assessment & Plan:   1. Shortness of breath   2. Atypical chest pain   3. Fatigue, unspecified type   4. Other fatigue   5. Nutritional anemia, unspecified    6. Vitamin D deficiency   7. Hypotension, unspecified hypotension type   8. Allergic rhinitis, unspecified seasonality, unspecified trigger     DG Chest Portable 2 Views Result Date: 01/21/2020 CLINICAL DATA:  79 year old female with cough peer EXAM: CHEST  2 VIEW PORTABLE COMPARISON:  Chest radiograph dated 08/18/2017. FINDINGS: There is mild chronic bronchitic changes. No focal consolidation, pleural effusion, or pneumothorax. Cardiac silhouette is within normal limits. No acute osseous pathology. IMPRESSION: No active cardiopulmonary disease. Electronically Signed   By: Anner Crete M.D.   On: 01/21/2020 19:56     It is unclear of the reason patient was referred to the [facility] Res[facility] clinic given patients symptoms have been present for  3 months and are concerning for cardiac involvement. She is negative for  COVID-19 and has had no close COVID-19 exposures. She has orthostatic hypotension which is not a new finding and is hypotensive today. EKG completed today NSR with non-specific T-wave present. No acute cardiac abnormal findings noted on physical exam with the exception of trace edema.  Chest x-ray revealed minimal bronchitic changes other no acute cardiopulmonary abnormalities see on imaging. -PCP already covered with Doxycyline.  Patient requested medication for chronic rhinitis - sent a prescription for Atrovent Nasal Spray  Recommended follow-up with cardiologist for hypotension and HF work-up. Referral placed to [facility]. Recommended ER if weakness and dizziness worsens.  Encouraged to change position slowly to reduce the risk of falls. Encouraged follow-up with PCP. No additional follow-up warranted at the respiratory clinic. Labs collected (ordered by PCP)   -The patient was given clear instructions to go to ER or return to medical center if symptoms do not improve, worsen or new problems develop. The patient verbalized understanding.     Molli Barrows, FNP-C [facility], PRN Provider  [facility]. [place], [place] Clinic Phone: [phone] Clinic Fax: [phone] Clinic Hours: 5:30 pm -7:30 pm (Monday-Friday)

## 2020-01-22 ENCOUNTER — Other Ambulatory Visit: Payer: Self-pay

## 2020-01-23 ENCOUNTER — Encounter: Payer: Self-pay | Admitting: Obstetrics and Gynecology

## 2020-01-23 ENCOUNTER — Ambulatory Visit: Payer: Medicare HMO | Admitting: Obstetrics and Gynecology

## 2020-01-23 ENCOUNTER — Telehealth: Payer: Self-pay | Admitting: Family Medicine

## 2020-01-23 VITALS — BP 134/76 | HR 77 | Temp 97.9°F | Ht 61.0 in | Wt 123.0 lb

## 2020-01-23 DIAGNOSIS — N952 Postmenopausal atrophic vaginitis: Secondary | ICD-10-CM

## 2020-01-23 DIAGNOSIS — N393 Stress incontinence (female) (male): Secondary | ICD-10-CM | POA: Diagnosis not present

## 2020-01-23 DIAGNOSIS — N763 Subacute and chronic vulvitis: Secondary | ICD-10-CM

## 2020-01-23 DIAGNOSIS — L309 Dermatitis, unspecified: Secondary | ICD-10-CM

## 2020-01-23 DIAGNOSIS — R159 Full incontinence of feces: Secondary | ICD-10-CM | POA: Diagnosis not present

## 2020-01-23 MED ORDER — BETAMETHASONE VALERATE 0.1 % EX OINT: TOPICAL_OINTMENT | CUTANEOUS | 0 refills | Status: DC

## 2020-01-23 MED ORDER — ESTRADIOL 10 MCG VA TABS: ORAL_TABLET | VAGINAL | 3 refills | Status: DC

## 2020-01-23 NOTE — Telephone Encounter (Signed)
Patient stated she had an EKG and chest x-ray done at the respiratory clinic.  Message sent to PCP.

## 2020-01-23 NOTE — Progress Notes (Signed)
GYNECOLOGY  VISIT   HPI: 79 y.o.   Married White or Caucasian Not Hispanic or Latino  female   [id number] with No LMP recorded. Patient has had a hysterectomy.   here for   Follow up vaginitis. She states that she is still " pretty sore" She has been having dizziness. She recently was seen for low blood pressure and weakness. She had blood drawn that day but has not gotten the results. She is still experiencing burning with urination.    She was originally seen at the end of January c/o vaginal burning, irritation and brown vaginal d/c. She was concerned she was passing stool through her vagina. She was noted to have atrophic vaginitis, no signs of rectovaginal fistula. A nuswab vaginitis panel was negative for infection. She was instructed in vulvar skin care and was started on vaginal estrogen. She used the vaginal estrogen nightly (read the script wrong), using a lot of vasaline. The brown discharge is better. Still slightly sore to insert the vaginal tablets, but it has improved. She is tender when she wipes after she voids. She c/o GSI, she has had a cough and the leakage has been worse. She has started a new nasal spray which has helped. Not leaking daily. She has some fecal incontinence so she has to wear a pad, depends at night. Can leaks a drop of liquid stool or empty her bowels. She will discuss with Dr Earlean Shawl.    She is sore in the perianal area as well, sometimes itchy.. She has had a partial colectomy. She can have no BM or several BM a day.   She is being evaluated for dizziness by her Primary and Cardiology.   GYNECOLOGIC HISTORY: No LMP recorded. Patient has had a hysterectomy. Contraception:none  Menopausal hormone therapy: estradiol         OB History    Gravida  1   Para  1   Term  0   Preterm  0   AB  0   Living  1     SAB  0   TAB  0   Ectopic  0   Multiple  0   Live Births  0              Patient Active Problem List   Diagnosis Date Noted  .  Excessive sweating 06/13/2019  . Chronic kidney disease (CKD), stage III (moderate) 08/01/2018  . Anemia, chronic renal failure 08/01/2018  . Chronic pain 11/19/2016  . Orthostatic hypotension 11/19/2016  . MDD (major depressive disorder), recurrent severe, without psychosis ([place]) 04/09/2015  . Osteoporosis 07/24/2014  . Migraine - followed by Dr. Sima Matas in Neurology 05/24/2014  . Anxiety and depression - managed at [facility] 05/24/2014  . GERD (gastroesophageal reflux disease) 05/24/2014  . Macular degeneration - goes to [facility] for this 05/24/2014    Past Medical History:  Diagnosis Date  . Arthritis   . Benzodiazepine dependence ([place])   . Carotid arterial disease (HCC)    mild  . Chronic kidney disease (CKD), stage III (moderate) 08/01/2018   -seeing  Nephrology, Dr. Posey Pronto, thought from [place] per renal notes   . Chronic pain    managed by pain clinic  . Colon abnormality    didn't work right so part of it was removed at [facility]  . Depression    managed by Dr. Toy Care  . Diastolic heart failure ([place])    mild on echo 2016  . GERD (gastroesophageal reflux disease)   .  IBS (irritable bowel syndrome)    sees Dr. Earlean Shawl  . Macular degeneration    goes to [facility] for this  . Migraine    managed by neurologist  . Osteoporosis   . Vertigo     Past Surgical History:  Procedure Laterality Date  . ABDOMINAL ADHESION SURGERY    . ABDOMINAL HYSTERECTOMY    . APPENDECTOMY    . CHOLECYSTECTOMY    . COLON SURGERY    . CYSTOSCOPY W/ URETERAL STENT PLACEMENT Right 04/27/2016   Procedure: CYSTOSCOPY WITH RETROGRADE PYELOGRAM/ RIGHT URETERAL STENT PLACEMENT;  Surgeon: Ardis Hughs, MD;  Location: [facility] ORS;  Service: Urology;  Laterality: Right;  . OVARIAN CYST SURGERY    . SMALL INTESTINE SURGERY      Current Outpatient Medications  Medication Sig Dispense Refill  . acetaminophen (TYLENOL) 500 MG tablet Take 1,000 mg by mouth daily as needed for headache.    . alendronate  (FOSAMAX) 70 MG tablet Take 1 tablet (70 mg total) by mouth once a week. TAKE 1 TABLET BY MOUTH ONCE WEEKLY WITH A FULL GLASS OF WATER ON AN EMPTY STOMACH 12 tablet 2  . Biotin 1000 MCG tablet Take 1,000 mcg by mouth daily.    . Calcium Carbonate (CALTRATE 600 PO) Take by mouth daily.    . citalopram (CELEXA) 20 MG tablet TK 1 T PO D  2  . cycloSPORINE (RESTASIS) 0.05 % ophthalmic emulsion Place 1 drop into both eyes 2 (two) times daily.     Marland Kitchen denosumab (PROLIA) 60 MG/ML SOSY injection Inject 60 mg into the skin every 6 (six) months.    . dorzolamide-timolol (COSOPT) 22.3-6.8 MG/ML ophthalmic solution Place 1 drop into both eyes 2 times daily.    Marland Kitchen doxycycline (VIBRA-TABS) 100 MG tablet Take 1 tablet (100 mg total) by mouth 2 (two) times daily for 7 days. 14 tablet 0  . Estradiol 10 MCG TABS vaginal tablet One tablet qhs x 7 days then change to 2 x a week at hs. 25 tablet 0  . folic acid (FOLVITE) 1 MG tablet Take by mouth.    . furosemide (LASIX) 40 MG tablet Take 1 tablet (40 mg total) by mouth daily. (from nephrology) 30 tablet 3  . Galcanezumab-gnlm (EMGALITY) 120 MG/ML SOAJ Inject into the skin.    Marland Kitchen ipratropium (ATROVENT) 0.03 % nasal spray Place 2 sprays into both nostrils 3 (three) times daily. 30 mL 0  . LINZESS 290 MCG CAPS capsule Take 290 mcg by mouth daily before breakfast.   0  . LORazepam (ATIVAN) 0.5 MG tablet Take 0.5 mg by mouth 3 (three) times daily as needed for anxiety.    Marland Kitchen olopatadine (PATANOL) 0.1 % ophthalmic solution 1 drop 2 (two) times daily.    Marland Kitchen omeprazole (PRILOSEC) 40 MG capsule Take 80 mg by mouth daily.   0  . Probiotic Product (ALIGN PO) Take by mouth daily.    . sodium bicarbonate 650 MG tablet TK 1 T PO BID  9  . SUMAtriptan (IMITREX) 100 MG tablet Take 100 mg by mouth every 2 (two) hours as needed for migraine.     . topiramate (TOPAMAX) 100 MG tablet Take 3 tablets at bedtime    . tretinoin (RETIN-A) 0.025 % cream APPLY A PEA SIZED AMOUNT TO FACE ONCE DAILY  IN THE EVENING    . UBRELVY 100 MG TABS     . VITAMIN D PO Take by mouth daily.    Marland Kitchen ZENPEP 15000 units CPEP  Take 45,000 mg by mouth 2 (two) times daily.   0   No current facility-administered medications for this visit.     ALLERGIES: Sulfa antibiotics  Family History  Problem Relation Age of Onset  . Kidney disease Mother   . Heart disease Mother   . Osteoporosis Mother   . Heart Problems Sister   . Cancer Maternal Aunt        intestinal and liver cancer  . Stroke Maternal Grandmother   . Heart disease Maternal Grandmother   . Stroke Other   . Depression Other        everyone  . Hypercalcemia Neg Hx   . Breast cancer Neg Hx     Social History   Socioeconomic History  . Marital status: Married    Spouse name: Not on file  . Number of children: Not on file  . Years of education: Not on file  . Highest education level: Not on file  Occupational History  . Not on file  Tobacco Use  . Smoking status: Never Smoker  . Smokeless tobacco: Never Used  Substance and Sexual Activity  . Alcohol use: No  . Drug use: No  . Sexual activity: Not Currently    Birth control/protection: Post-menopausal  Other Topics Concern  . Not on file  Social History Narrative   Work or School: none      Home Situation: lives with husband      Spiritual Beliefs:       Lifestyle: no regular exercise; diet is ok         Social Determinants of Radio broadcast assistant Strain:   . Difficulty of Paying Living Expenses: Not on file  Food Insecurity:   . Worried About Charity fundraiser in the Last Year: Not on file  . Ran Out of Food in the Last Year: Not on file  Transportation Needs:   . Lack of Transportation (Medical): Not on file  . Lack of Transportation (Non-Medical): Not on file  Physical Activity:   . Days of Exercise per Week: Not on file  . Minutes of Exercise per Session: Not on file  Stress:   . Feeling of Stress : Not on file  Social Connections:   . Frequency  of Communication with Friends and Family: Not on file  . Frequency of Social Gatherings with Friends and Family: Not on file  . Attends Religious Services: Not on file  . Active Member of Clubs or Organizations: Not on file  . Attends Archivist Meetings: Not on file  . Marital Status: Not on file  Intimate Partner Violence:   . Fear of Current or Ex-Partner: Not on file  . Emotionally Abused: Not on file  . Physically Abused: Not on file  . Sexually Abused: Not on file    Review of Systems  Neurological: Positive for dizziness.  All other systems reviewed and are negative.   PHYSICAL EXAMINATION:    BP 134/76   Pulse 77   Temp 97.9 F (36.6 C)   Ht 5\' 1"  (1.549 m)   Wt 123 lb (55.8 kg)   SpO2 96%   BMI 23.24 kg/m     General appearance: alert, cooperative and appears stated age  Pelvic: External genitalia:  no lesions, mild diffuse erythema. Erythema and tenderness around the vestibule.               Urethra:  normal appearing urethra with no masses, tenderness or lesions  Bartholins and Skenes: normal                 Vagina: atrophic appearing vagina (improved), slight normal white vaginal d/c              Cervix: absent   Perianal region with mild erythema                Chaperone was present for exam.  ASSESSMENT Atrophic vaginitis, mild improvement with vaginal estrogen Mild vulvitis, likely from chronic use of a pad Intermittent GSI Frequent fecal incontinence. H/O partial colectomy. She takes miralax to go, then leaks.  Mild perianal dermatitis    PLAN Continue with vaginal estrogen 2 x a week Will use steroid ointment to the vulva and perianal area up to 2 x a week as needed She will discuss if metamucil is a good option for her We discussed pelvic floor PT for incontinence issues, she will consider Discussed vulvar/perianal skin care. Recommended she pat not wipe when voiding (wiping hurts). Use Vaseline regularly F/U in one  month Information on fecal and urinary incontinence given   An After Visit Summary was printed and given to the patient.  Over 20 minutes was spent in total patient care.   CC: Dr Earlean Shawl

## 2020-01-23 NOTE — Patient Instructions (Addendum)
Use the vaginal estrogen and steroid ointment 2 x a week.   Urinary Incontinence  Urinary incontinence refers to a condition in which a person is unable to control where and when to pass urine. A person with this condition will urinate when he or she does not mean to (involuntarily). What are the causes? This condition may be caused by:  Medicines.  Infections.  Constipation.  Overactive bladder muscles.  Weak bladder muscles.  Weak pelvic floor muscles. These muscles provide support for the bladder, intestine, and, in women, the uterus.  Enlarged prostate in men. The prostate is a gland near the bladder. When it gets too big, it can pinch the urethra. With the urethra blocked, the bladder can weaken and lose the ability to empty properly.  Surgery.  Emotional factors, such as anxiety, stress, or post-traumatic stress disorder (PTSD).  Pelvic organ prolapse. This happens in women when organs shift out of place and into the vagina. This shift can prevent the bladder and urethra from working properly. What increases the risk? The following factors may make you more likely to develop this condition:  Older age.  Obesity and physical inactivity.  Pregnancy and childbirth.  Menopause.  Diseases that affect the nerves or spinal cord (neurological diseases).  Long-term (chronic) coughing. This can increase pressure on the bladder and pelvic floor muscles. What are the signs or symptoms? Symptoms may vary depending on the type of urinary incontinence you have. They include:  A sudden urge to urinate, but passing urine involuntarily before you can get to a bathroom (urge incontinence).  Suddenly passing urine with any activity that forces urine to pass, such as coughing, laughing, exercise, or sneezing (stress incontinence).  Needing to urinate often, but urinating only a small amount, or constantly dribbling urine (overflow incontinence).  Urinating because you cannot get to  the bathroom in time due to a physical disability, such as arthritis or injury, or communication and thinking problems, such as Alzheimer disease (functional incontinence). How is this diagnosed? This condition may be diagnosed based on:  Your medical history.  A physical exam.  Tests, such as: ? Urine tests. ? X-rays of your kidney and bladder. ? Ultrasound. ? CT scan. ? Cystoscopy. In this procedure, a health care provider inserts a tube with a light and camera (cystoscope) through the urethra and into the bladder in order to check for problems. ? Urodynamic testing. These tests assess how well the bladder, urethra, and sphincter can store and release urine. There are different types of urodynamic tests, and they vary depending on what the test is measuring. To help diagnose your condition, your health care provider may recommend that you keep a log of when you urinate and how much you urinate. How is this treated? Treatment for this condition depends on the type of incontinence that you have and its cause. Treatment may include:  Lifestyle changes, such as: ? Quitting smoking. ? Maintaining a healthy weight. ? Staying active. Try to get 150 minutes of moderate-intensity exercise every week. Ask your health care provider which activities are safe for you. ? Eating a healthy diet.  Avoid high-fat foods, like fried foods.  Avoid refined carbohydrates like white bread and white rice.  Limit how much alcohol and caffeine you drink.  Increase your fiber intake. Foods such as fresh fruits, vegetables, beans, and whole grains are healthy sources of fiber.  Pelvic floor muscle exercises.  Bladder training, such as lengthening the amount of time between bathroom breaks, or  using the bathroom at regular intervals.  Using techniques to suppress bladder urges. This can include distraction techniques or controlled breathing exercises.  Medicines to relax the bladder muscles and prevent  bladder spasms.  Medicines to help slow or prevent the growth of a man's prostate.  Botox injections. These can help relax the bladder muscles.  Using pulses of electricity to help change bladder reflexes (electrical nerve stimulation).  For women, using a medical device to prevent urine leaks. This is a small, tampon-like, disposable device that is inserted into the urethra.  Injecting collagen or carbon beads (bulking agents) into the urinary sphincter. These can help thicken tissue and close the bladder opening.  Surgery. Follow these instructions at home: Lifestyle  Limit alcohol and caffeine. These can fill your bladder quickly and irritate it.  Keep yourself clean to help prevent odors and skin damage. Ask your doctor about special skin creams and cleansers that can protect the skin from urine.  Consider wearing pads or adult diapers. Make sure to change them regularly, and always change them right after experiencing incontinence. General instructions  Take over-the-counter and prescription medicines only as told by your health care provider.  Use the bathroom about every 3-4 hours, even if you do not feel the need to urinate. Try to empty your bladder completely every time. After urinating, wait a minute. Then try to urinate again.  Make sure you are in a relaxed position while urinating.  If your incontinence is caused by nerve problems, keep a log of the medicines you take and the times you go to the bathroom.  Keep all follow-up visits as told by your health care provider. This is important. Contact a health care provider if:  You have pain that gets worse.  Your incontinence gets worse. Get help right away if:  You have a fever or chills.  You are unable to urinate.  You have redness in your groin area or down your legs. Summary  Urinary incontinence refers to a condition in which a person is unable to control where and when to pass urine.  This condition  may be caused by medicines, infection, weak bladder muscles, weak pelvic floor muscles, enlargement of the prostate (in men), or surgery.  The following factors increase your risk for developing this condition: older age, obesity, pregnancy and childbirth, menopause, neurological diseases, and chronic coughing.  There are several types of urinary incontinence. They include urge incontinence, stress incontinence, overflow incontinence, and functional incontinence.  This condition is usually treated first with lifestyle and behavioral changes, such as quitting smoking, eating a healthier diet, and doing regular pelvic floor exercises. Other treatment options include medicines, bulking agents, medical devices, electrical nerve stimulation, or surgery. This information is not intended to replace advice given to you by your health care provider. Make sure you discuss any questions you have with your health care provider. Document Revised: 11/25/2017 Document Reviewed: 02/24/2017 Elsevier Patient Education  [place]. Fecal Incontinence Fecal incontinence, also called accidental bowel leakage, is not being able to control your bowels. This condition happens because the nerves or muscles around the anus do not work the way they should. This affects their ability to hold stool (feces). What are the causes? This condition may be caused by:  Damage to the muscles at the end of the rectum (sphincter).  Damage to the nerves that control bowel movements.  Diarrhea.  Chronic constipation.  Pelvic floor dysfunction. This means the muscles in the pelvis do not  work well.  Loss of bowel storage capacity. This occurs when the rectum can no longer stretch in size in order to store feces.  Inflammatory bowel disease (IBD), such as Crohn's disease.  Irritable bowel syndrome (IBS). What increases the risk? You are more likely to develop this condition if you:  Were born with bowels or a pelvis  that did not form correctly.  Have had rectal surgery.  Have had radiation treatment for certain cancers.  Have been pregnant, had a vaginal delivery, or had surgery that damaged the pelvic floor muscles.  Had a complicated childbirth, spinal cord injury, or other trauma that caused nerve damage.  Have a condition that can affect nerve function, such as diabetes, Parkinson's disease, or multiple sclerosis.  Have a condition where the rectum drops down into the anus or vagina (prolapse).  Are 66 years of age or older. What are the signs or symptoms? The main symptom of this condition is not being able to control your bowels. You also might not be able to get to the bathroom before a bowel movement. How is this diagnosed? This condition is diagnosed with a medical history and physical exam. You may also have other tests, including:  Blood tests.  Urine tests.  A rectal exam.  Ultrasound.  MRI.  Colonoscopy. This is an exam that looks at your large intestine (colon).  Anal manometry. This is a test that measures the strength of the anal sphincter.  Anal electromyogram (EMG). This is a test that uses small electrodes to check for nerve damage. How is this treated? Treatment for this condition depends on the cause and severity. Treatment may also focus on addressing any underlying causes of this condition. Treatment may include:  Medicines. This may include medicines to: ? Prevent diarrhea. ? Help with constipation (bulk-forming laxatives). ? Treat any underlying conditions.  Biofeedback therapy. This can help to retrain muscles that are affected.  Fiber supplements. These can help manage your bowel movements.  Nerve stimulation.  Injectable gel to promote tissue growth and better muscle control.  Surgery. You may need: ? Sphincter repair surgery. ? Diversion surgery. This procedure lets feces pass out of your body through a hole in your abdomen. Follow these  instructions at home: Eating and drinking   Follow instructions from your health care provider about any eating or drinking restrictions. ? Work with a dietitian to come up with a healthy diet that will help you avoid the foods that can make your condition worse. ? Keep a diet diary to find out which foods or drinks could be making your condition worse.  Drink enough fluid to keep your urine pale yellow. Lifestyle  Do not use any products that contain nicotine or tobacco, such as cigarettes and e-cigarettes. If you need help quitting, ask your health care provider. This may help your condition.  If you are overweight, talk with your health care provider about how to safely lose weight. This may help your condition.  Increase your physical activity as told by your health care provider. This may help your condition. Always talk with your health care provider before starting a new exercise program.  Carry a change of clothes and supplies to clean up quickly if you have an episode of fetal incontinence.  Consider joining a fecal incontinence support group. You can find a support group online or in your local community. General instructions   Take over-the-counter and prescription medicines only as told by your health care provider. This includes  any supplements.  Apply a moisture barrier, such as petroleum jelly, to your rectum. This protects the skin from irritation caused by ongoing leaking or diarrhea.  Tell your health care provider if you are upset or depressed about your condition.  Keep all follow-up visits as told by your health care provider. This is important. Where to find more information  International Foundation for Functional Gastrointestinal Disorders: iffgd.[employer]: patients.gi.org Contact a health care provider if:  You have a fever.  You have redness, swelling, or pain around your rectum.  Your pain is getting worse or you lose  feeling in your rectal area.  You have blood in your stool.  You feel sad or hopeless.  You avoid social or work situations. Get help right away if:  You stop having bowel movements.  You cannot eat or drink without vomiting.  You have rectal bleeding that does not stop.  You have severe pain that is getting worse.  You have symptoms of dehydration, including: ? Sleepiness or fatigue. ? Producing little or no urine, tears, or sweat. ? Dizziness. ? Dry mouth. ? Unusual irritability. ? Headache. ? Inability to think clearly. Summary  Fecal incontinence, also called accidental bowel leakage, is not being able to control your bowels. This condition happens because the nerves or muscles around the anus do not work the way they should.  Treatment varies depending on the cause and severity of your condition. Treatment may also focus on addressing any underlying causes of this condition.  Follow instructions from your health care provider about any eating or drinking restrictions, lifestyle changes, and skin care.  Take over-the-counter and prescription medicines only as told by your health care provider. This includes any supplements.  Tell your health care provider if your symptoms worsen or if you are upset or depressed about your condition. This information is not intended to replace advice given to you by your health care provider. Make sure you discuss any questions you have with your health care provider. Document Revised: 03/30/2018 Document Reviewed: 03/30/2018 Elsevier Patient Education  2020 [employer] and Bowel Hygiene   Anyone who has frequent bowel movements, diarrhea, or bowel leakage (fecal incontinence) may experience soreness or skin irritation around the anal region.  Occasionally, the skin can become so inflamed that it breaks into open sores.  Prevent skin breakdown by following good skin care habits.  Cleaning and Washing Techniques After having  a bowel movement, men and women should tighten their anal sphincter before wiping.  Women should always wipe from front to back to prevent fecal matter from getting into the urethra and vagina.   Tips for Cleaning and Washing . wipe from front to back towards the anus . always wipe gently with soft toilet paper, or ideally with moist toilet paper . wipe only once with each piece of toilet paper so as not to re-contaminate the area . wash in warm water alone or with a minimal amount of mild, fragrance-free soap . use non-biological washing powder . gently pat skin completely dry, avoiding rubbing . if drying the skin after washing is difficult or uncomfortable, try using a hairdryer on a low setting (use very carefully) . allow air to get to the irritated area for some part of every day . use protective skin creams containing zinc as recommended by your doctor  What To Avoid . baths with extra-hot water . soaking for long periods of time in the bathtub . disinfectants  and antiseptics  . bath oils, bath salts, and talcum powder . using plastic pants, pads, and sheets, which cause sweating . scratching at the irritated area  Additional Tips . some people find that citrus and acidic foods cause or worsen skin irritation . eat a healthy, balanced diet that is high in fiber  . drink plenty of fluids . wear cotton underwear to allow the skin to breath . talk to your healthcare provider about further treatment options; persistent problems need medical attention   2007, [employer] Doc.25 Atrophic Vaginitis  Atrophic vaginitis is a condition in which the tissues that line the vagina become dry and thin. This condition is most common in women who have stopped having regular menstrual periods (are in menopause). This usually starts when a woman is 25-44 years old. That is the time when a woman's estrogen levels begin to drop (decrease). Estrogen is a female hormone. It helps to  keep the tissues of the vagina moist. It stimulates the vagina to produce a clear fluid that lubricates the vagina for sexual intercourse. This fluid also protects the vagina from infection. Lack of estrogen can cause the lining of the vagina to get thinner and dryer. The vagina may also shrink in size. It may become less elastic. Atrophic vaginitis tends to get worse over time as a woman's estrogen level drops. What are the causes? This condition is caused by the normal drop in estrogen that happens around the time of menopause. What increases the risk? Certain conditions or situations may lower a woman's estrogen level, leading to a higher risk for atrophic vaginitis. You are more likely to develop this condition if:  You are taking medicines that block estrogen.  You have had your ovaries removed.  You are being treated for cancer with X-ray (radiation) or medicines (chemotherapy).  You have given birth or are breastfeeding.  You are older than age 64.  You smoke. What are the signs or symptoms? Symptoms of this condition include:  Pain, soreness, or bleeding during sexual intercourse (dyspareunia).  Vaginal burning, irritation, or itching.  Pain or bleeding when a speculum is used in a vaginal exam (pelvic exam).  Having burning pain when passing urine.  Vaginal discharge that is brown or yellow. In some cases, there are no symptoms. How is this diagnosed? This condition is diagnosed by taking a medical history and doing a physical exam. This will include a pelvic exam that checks the vaginal tissues. Though rare, you may also have other tests, including:  A urine test.  A test that checks the acid balance in your vagina (acid balance test). How is this treated? Treatment for this condition depends on how severe your symptoms are. Treatment may include:  Using an over-the-counter vaginal lubricant before sex.  Using a long-acting vaginal moisturizer.  Using low-dose  vaginal estrogen for moderate to severe symptoms that do not respond to other treatments. Options include creams, tablets, and inserts (vaginal rings). Before you use a vaginal estrogen, tell your health care provider if you have a history of: ? Breast cancer. ? Endometrial cancer. ? Blood clots. If you are not sexually active and your symptoms are very mild, you may not need treatment. Follow these instructions at home: Medicines  Take over-the-counter and prescription medicines only as told by your health care provider. Do not use herbal or alternative medicines unless your health care provider says that you can.  Use over-the-counter creams, lubricants, or moisturizers for dryness only as directed by  your health care provider. General instructions  If your atrophic vaginitis is caused by menopause, discuss all of your menopause symptoms and treatment options with your health care provider.  Do not douche.  Do not use products that can make your vagina dry. These include: ? Scented feminine sprays. ? Scented tampons. ? Scented soaps.  Vaginal intercourse can help to improve blood flow and elasticity of vaginal tissue. If it hurts to have sex, try using a lubricant or moisturizer just before having intercourse. Contact a health care provider if:  Your discharge looks different than normal.  Your vagina has an unusual smell.  You have new symptoms.  Your symptoms do not improve with treatment.  Your symptoms get worse. Summary  Atrophic vaginitis is a condition in which the tissues that line the vagina become dry and thin. It is most common in women who have stopped having regular menstrual periods (are in menopause).  Treatment options include using vaginal lubricants and low-dose vaginal estrogen.  Contact a health care provider if your vagina has an unusual smell, or if your symptoms get worse or do not improve after treatment. This information is not intended to replace  advice given to you by your health care provider. Make sure you discuss any questions you have with your health care provider. Document Revised: 10/28/2017 Document Reviewed: 08/11/2017 Elsevier Patient Education  2020 [employer].

## 2020-01-23 NOTE — Telephone Encounter (Signed)
Per Margarita Grizzle at the [facility] lab, they cannot add tests performed at the respiratory clinic as they are not ran through our system.  I spoke with the pt and informed her of this and the message below.  Patient stated she does recall the provider there telling her to contact her heart doctor as she was seen by Dr Claiborne Billings years ago.  I gave the pt the phone number to contact Dr Claiborne Billings at [phone].  Message sent to PCP.

## 2020-01-23 NOTE — Telephone Encounter (Signed)
I do not believe she had an EKG done?  I do not see any results in the system.  Please see results note.  I was waiting for all information to come back before calling, but I will address but I have at this point.

## 2020-01-23 NOTE — Telephone Encounter (Signed)
Pt  would like to know her  test results, x-ray, labs EKG  call back number [phone]

## 2020-01-23 NOTE — Telephone Encounter (Signed)
I still can't see EKG. I can see rest of note. Looks like they advised her to call cardiology for follow up. Is there any way to add a BNP to the bloodwork they did? Chest xray ray looked ok.   Happy to discuss further if she is not feeling better at this point.

## 2020-01-24 NOTE — Telephone Encounter (Signed)
Noted. I do think reasonable to follow back up with cardiology given hypotension and other symptoms. Last visit was awhile ago with them, but I think this is a good next step.

## 2020-01-28 ENCOUNTER — Telehealth: Payer: Self-pay | Admitting: Family Medicine

## 2020-01-28 NOTE — Telephone Encounter (Signed)
Pt's spouse,Jerry Cusic (DPR), would like a copy of her recent lab results and EKG.  [name] can be reached at [phone]

## 2020-01-28 NOTE — Telephone Encounter (Signed)
Pt spouse would like to know test results Please call [phone]. Pt did speak to [facility] on 2/24 but do not remember results.

## 2020-01-29 ENCOUNTER — Telehealth: Payer: Self-pay | Admitting: Cardiology

## 2020-01-29 NOTE — Telephone Encounter (Signed)
Patient's husband calling to request he come with the patient to her appointment 3/5, because he states she has memory issues.

## 2020-01-29 NOTE — Telephone Encounter (Signed)
Ok to relay lab result note to husband and ok to send lab results to him if desired. I do not see that EKG was completed and we have no copy of a recent one in the system.

## 2020-01-29 NOTE — Telephone Encounter (Signed)
Left message-ok to come to appt with patient

## 2020-01-30 ENCOUNTER — Ambulatory Visit: Payer: Self-pay | Admitting: Obstetrics and Gynecology

## 2020-01-30 LAB — COMPREHENSIVE METABOLIC PANEL
ALT: 22 IU/L (ref 0–32)
AST: 20 IU/L (ref 0–40)
Albumin/Globulin Ratio: 1.6 (ref 1.2–2.2)
Albumin: 4.9 g/dL — ABNORMAL HIGH (ref 3.7–4.7)
Alkaline Phosphatase: 53 IU/L (ref 39–117)
BUN/Creatinine Ratio: 22 (ref 12–28)
BUN: 29 mg/dL — ABNORMAL HIGH (ref 8–27)
Bilirubin Total: 0.3 mg/dL (ref 0.0–1.2)
CO2: 15 mmol/L — ABNORMAL LOW (ref 20–29)
Calcium: 10.2 mg/dL (ref 8.7–10.3)
Chloride: 105 mmol/L (ref 96–106)
Creatinine, Ser: 1.3 mg/dL — ABNORMAL HIGH (ref 0.57–1.00)
GFR calc Af Amer: 46 mL/min/{1.73_m2} — ABNORMAL LOW (ref >59)
GFR calc non Af Amer: 40 mL/min/{1.73_m2} — ABNORMAL LOW (ref >59)
Globulin, Total: 3 g/dL (ref 1.5–4.5)
Glucose: 91 mg/dL (ref 65–99)
Potassium: 4.4 mmol/L (ref 3.5–5.2)
Sodium: 139 mmol/L (ref 134–144)
Total Protein: 7.9 g/dL (ref 6.0–8.5)

## 2020-01-30 LAB — CBC WITH DIFFERENTIAL/PLATELET
Basophils Absolute: 0.1 10*3/uL (ref 0.0–0.2)
Basos: 1 %
EOS (ABSOLUTE): 0.1 10*3/uL (ref 0.0–0.4)
Eos: 1 %
Hematocrit: 44.4 % (ref 34.0–46.6)
Hemoglobin: 15.1 g/dL (ref 11.1–15.9)
Immature Grans (Abs): 0 10*3/uL (ref 0.0–0.1)
Immature Granulocytes: 0 %
Lymphocytes Absolute: 3.3 10*3/uL — ABNORMAL HIGH (ref 0.7–3.1)
Lymphs: 36 %
MCH: 33.9 pg — ABNORMAL HIGH (ref 26.6–33.0)
MCHC: 34 g/dL (ref 31.5–35.7)
MCV: 100 fL — ABNORMAL HIGH (ref 79–97)
Monocytes Absolute: 0.8 10*3/uL (ref 0.1–0.9)
Monocytes: 9 %
Neutrophils Absolute: 4.9 10*3/uL (ref 1.4–7.0)
Neutrophils: 53 %
Platelets: 253 10*3/uL (ref 150–450)
RBC: 4.46 x10E6/uL (ref 3.77–5.28)
RDW: 12.2 % (ref 11.7–15.4)
WBC: 9.1 10*3/uL (ref 3.4–10.8)

## 2020-01-30 LAB — VITAMIN D 1,25 DIHYDROXY
Vitamin D 1, 25 (OH)2 Total: 32 pg/mL
Vitamin D2 1, 25 (OH)2: <10 pg/mL
Vitamin D3 1, 25 (OH)2: 30 pg/mL

## 2020-01-30 LAB — VITAMIN B12: Vitamin B-12: >2000 pg/mL — ABNORMAL HIGH (ref 232–1245)

## 2020-01-30 LAB — TSH: TSH: 2.24 u[IU]/mL (ref 0.450–4.500)

## 2020-01-30 NOTE — Telephone Encounter (Signed)
[place] for xray report if desired. Xray did not show any acute abnormalities, which is reassuring. I am glad she has a follow up with cardiology. If any worsening of symptoms in meanwhile, she needs to be seen. In terms of dehydration it wasn't overwhelming and labs were pretty stable from baseline. I would not encourage increasing fluid intake more than she is already doing. I would just keep up with what she does now.

## 2020-01-30 NOTE — Telephone Encounter (Signed)
Spoke with Mr Ahlin and informed him of the message below.

## 2020-01-30 NOTE — Telephone Encounter (Signed)
Spouse, [name], returned Denice Paradise Anne's call. He can be reached at [phone]

## 2020-01-30 NOTE — Telephone Encounter (Signed)
Spoke with Mr Devilbiss and informed him of the lab results per Dr Ethlyn Gallery as he stated he does not think the patient recalls discussing this with me.  Mr Budnick stated the pt drinks 80-100 ounces of water per day and he is not sure why the labs reveal dehydration?  Mr Riggsbee stated the pt has recurrent chest pain, weakness and has  an appt with cardiology this Friday and he requested the results of the chest x-ray performed at the respiratory clinic.  Message sent to PCP.

## 2020-02-01 ENCOUNTER — Ambulatory Visit: Payer: Medicare HMO | Admitting: Cardiology

## 2020-02-01 ENCOUNTER — Other Ambulatory Visit: Payer: Self-pay

## 2020-02-01 ENCOUNTER — Encounter: Payer: Self-pay | Admitting: Cardiology

## 2020-02-01 VITALS — BP 120/60 | HR 61 | Temp 96.8°F | Ht 61.0 in | Wt 120.2 lb

## 2020-02-01 DIAGNOSIS — R079 Chest pain, unspecified: Secondary | ICD-10-CM

## 2020-02-01 DIAGNOSIS — R6883 Chills (without fever): Secondary | ICD-10-CM | POA: Diagnosis not present

## 2020-02-01 NOTE — Progress Notes (Signed)
Cardiology Office Note:    Date:  02/03/2020   ID:  Danielle Gaines [place], [place] December 25, 1940, MRN [id number]  PCP:  Danielle Macadam, MD  Cardiologist:  No primary Gaines provider on file.  Electrophysiologist:  None   Referring MD: Danielle Jun, FNP   Chief Complaint  Patient presents with  . Chest Pain    History of Present Illness:    Danielle Gaines is a 79 y.o. female with a hx of CKD stage III, carotid artery disease, benzodiazepine dependence, diastolic dysfunction, IBS, fibromyalgia who is referred by Danielle Barrows, FNP for evaluation of chest pain.  She reports that she started having chest pain 1 month ago.  Occurs in center of chest.  Describes as occasionally sharp but most often a dull aching pain.  Reports sharp pain typically lasts a few seconds and resolves but dull aching pain is constant.  Worse with deep breathing.  Also worse with certain movements.  She reports that she has not been doing anything for exercise.  Has not noted anything that improves her pain.  Describes 6-7 out of 10 pain.  Associated with shortness of breath.  Also reports that she has been having some lower extremity edema.  States that she has been having chills, denies any fevers.   Past Medical History:  Diagnosis Date  . Arthritis   . Benzodiazepine dependence ([place])   . Carotid arterial disease (HCC)    mild  . Chronic kidney disease (CKD), stage III (moderate) 08/01/2018   -seeing  Nephrology, Danielle Gaines, thought from Danielle Gaines per renal notes   . Chronic pain    managed by pain clinic  . Colon abnormality    didn't work right so part of it was removed at [facility]  . Depression    managed by Danielle Gaines  . Diastolic heart failure (Danielle Gaines)    mild on echo 2016  . GERD (gastroesophageal reflux disease)   . IBS (irritable bowel syndrome)    sees Danielle Gaines  . Macular degeneration    goes to [facility] for this  . Migraine    managed by neurologist  . Osteoporosis   . Vertigo      Past Surgical History:  Procedure Laterality Date  . ABDOMINAL ADHESION SURGERY    . ABDOMINAL HYSTERECTOMY    . APPENDECTOMY    . CHOLECYSTECTOMY    . COLON SURGERY    . CYSTOSCOPY W/ URETERAL STENT PLACEMENT Right 04/27/2016   Procedure: CYSTOSCOPY WITH RETROGRADE PYELOGRAM/ RIGHT URETERAL STENT PLACEMENT;  Surgeon: Danielle Hughs, MD;  Location: [facility];  Service: Urology;  Laterality: Right;  . OVARIAN CYST SURGERY    . SMALL INTESTINE SURGERY      Current Medications: Current Meds  Medication Sig  . acetaminophen (TYLENOL) 500 MG tablet Take 1,000 mg by mouth daily as needed for headache.  . alendronate (FOSAMAX) 70 MG tablet Take 1 tablet (70 mg total) by mouth once a week. TAKE 1 TABLET BY MOUTH ONCE WEEKLY WITH A FULL GLASS OF WATER ON AN EMPTY STOMACH  . betamethasone valerate ointment (VALISONE) 0.1 % Apply a pea sized amount 2 x a week as needed  . Biotin 1000 MCG tablet Take 1,000 mcg by mouth daily.  . Calcium Carbonate (CALTRATE 600 PO) Take by mouth daily.  . citalopram (CELEXA) 20 MG tablet TK 1 T PO D  . cycloSPORINE (RESTASIS) 0.05 % ophthalmic emulsion Place 1 drop into both eyes 2 (two) times daily.   Danielle Gaines  denosumab (PROLIA) 60 MG/ML SOSY injection Inject 60 mg into the skin every 6 (six) months.  . dorzolamide-timolol (COSOPT) 22.3-6.8 MG/ML ophthalmic solution Place 1 drop into both eyes 2 times daily.  . Estradiol 10 MCG TABS vaginal tablet One tablet vaginally 2 x a week at hs.  . folic acid (FOLVITE) 1 MG tablet Take by mouth.  . furosemide (LASIX) 40 MG tablet Take 1 tablet (40 mg total) by mouth daily. (from nephrology)  . Galcanezumab-gnlm (EMGALITY) 120 MG/ML SOAJ Inject into the skin.  Danielle Gaines ipratropium (ATROVENT) 0.03 % nasal spray Place 2 sprays into both nostrils 3 (three) times daily.  Danielle Gaines LINZESS 290 MCG CAPS capsule Take 290 mcg by mouth daily before breakfast.   . LORazepam (ATIVAN) 0.5 MG tablet Take 0.5 mg by mouth 3 (three) times daily as needed  for anxiety.  Danielle Gaines olopatadine (PATANOL) 0.1 % ophthalmic solution 1 drop 2 (two) times daily.  Danielle Gaines omeprazole (PRILOSEC) 40 MG capsule Take 80 mg by mouth daily.   . Probiotic Product (ALIGN PO) Take by mouth daily.  . sodium bicarbonate 650 MG tablet TK 1 T PO BID  . SUMAtriptan (IMITREX) 100 MG tablet Take 100 mg by mouth every 2 (two) hours as needed for migraine.   . topiramate (TOPAMAX) 100 MG tablet Take 3 tablets at bedtime  . tretinoin (RETIN-A) 0.025 % cream APPLY A PEA SIZED AMOUNT TO FACE ONCE DAILY IN THE EVENING  . UBRELVY 100 MG TABS   . VITAMIN D PO Take by mouth daily.  Danielle Gaines ZENPEP 15000 units CPEP Take 45,000 mg by mouth 2 (two) times daily.      Allergies:   Sulfa antibiotics   Social History   Socioeconomic History  . Marital status: Married    Spouse name: Not on file  . Number of children: Not on file  . Years of education: Not on file  . Highest education level: Not on file  Occupational History  . Not on file  Tobacco Use  . Smoking status: Never Smoker  . Smokeless tobacco: Never Used  Substance and Sexual Activity  . Alcohol use: No  . Drug use: No  . Sexual activity: Not Currently    Birth control/protection: Post-menopausal  Other Topics Concern  . Not on file  Social History Narrative   Work or School: none      Home Situation: lives with husband      Spiritual Beliefs:       Lifestyle: no regular exercise; diet is ok         Social Determinants of Radio broadcast assistant Strain:   . Difficulty of Paying Living Expenses: Not on file  Food Insecurity:   . Worried About Charity fundraiser in the Last Year: Not on file  . Ran Out of Food in the Last Year: Not on file  Transportation Needs:   . Lack of Transportation (Medical): Not on file  . Lack of Transportation (Non-Medical): Not on file  Physical Activity:   . Days of Exercise per Week: Not on file  . Minutes of Exercise per Session: Not on file  Stress:   . Feeling of Stress :  Not on file  Social Connections:   . Frequency of Communication with Friends and Family: Not on file  . Frequency of Social Gatherings with Friends and Family: Not on file  . Attends Religious Services: Not on file  . Active Member of Clubs or Organizations: Not on file  .  Attends Archivist Meetings: Not on file  . Marital Status: Not on file     Family History: The patient's family history includes Cancer in her maternal aunt; Depression in an other family member; Heart Problems in her sister; Heart disease in her maternal grandmother and mother; Kidney disease in her mother; Osteoporosis in her mother; Stroke in her maternal grandmother and another family member. There is no history of Hypercalcemia or Breast cancer.  ROS:   Please see the history of present illness.     All other systems reviewed and are negative.  EKGs/Labs/Other Studies Reviewed:    The following studies were reviewed today:   EKG:  EKG is  ordered today.  The ekg ordered today demonstrates normal sinus rhythm, rate 61, low voltage QRS, Q waves in V1/V2  Recent Labs: 01/21/2020: ALT 22; BUN 29; Creatinine, Ser 1.30; Hemoglobin 15.1; Platelets 253; Potassium 4.4; Sodium 139; TSH 2.240  Recent Lipid Panel    Component Value Date/Time   CHOL 173 08/18/2017 1209   TRIG 106.0 08/18/2017 1209   HDL 79.10 08/18/2017 1209   CHOLHDL 2 08/18/2017 1209   VLDL 21.2 08/18/2017 1209   LDLCALC 73 08/18/2017 1209   TTE 04/08/15: - Left ventricle: The cavity size was normal. Wall thickness was  normal. Systolic function was normal. The estimated ejection  fraction was in the range of 55% to 60%. Doppler parameters are  consistent with abnormal left ventricular relaxation (grade 1  diastolic dysfunction). The E/e&' ratio is between 8-15,  suggesting indeterminate LV filling pressure.  - Left atrium: The atrium was normal in size.   Impressions:   - Compared to a prior echo in 2012, there is now  diastolic  dysfunction.   Physical Exam:    VS:  BP 120/60   Pulse 61   Temp (!) 96.8 F (36 C)   Ht '5\' 1"'  (1.549 m)   Wt 120 lb 3.2 oz (54.5 kg)   SpO2 99%   BMI 22.71 kg/m     Wt Readings from Last 3 Encounters:  02/01/20 120 lb 3.2 oz (54.5 kg)  01/23/20 123 lb (55.8 kg)  01/21/20 121 lb 1.3 oz (54.9 kg)     GEN:  in no acute distress HEENT: Normal NECK: No JVD CARDIAC: RRR RESPIRATORY:  Clear to auscultation without rales, wheezing or rhonchi  ABDOMEN: Soft, non-tender, non-distended MUSCULOSKELETAL:  No edema SKIN: Warm and dry NEUROLOGIC:  Alert and oriented x 3 PSYCHIATRIC:  Normal affect   ASSESSMENT:    1. Chest pain, unspecified type   2. Chills    PLAN:    Chest pain: Atypical description, but given age/risk factors will evaluate further with a Lexiscan Myoview to rule out ischemia.  No friction rub on exam or EKG changes to suggest pericarditis, but given pleuritic component and patient has been having chills, will check ESR/CRP to evaluate for evidence of inflammation.  Will check TTE.  If cardiac work-up is unremarkable, suspect her pain is likely due to the fibromyalgia.  RTC in 3 months  Medication Adjustments/Labs and Tests Ordered: Current medicines are reviewed at length with the patient today.  Concerns regarding medicines are outlined above.  Orders Placed This Encounter  Procedures  . Sed Rate (ESR)  . C-reactive protein  . MYOCARDIAL PERFUSION IMAGING  . EKG 12-Lead  . ECHOCARDIOGRAM COMPLETE   No orders of the defined types were placed in this encounter.   Patient Instructions  Medication Instructions:  Your physician recommends  that you continue on your current medications as directed. Please refer to the Current Medication list given to you today.  *If you need a refill on your cardiac medications before your next appointment, please call your pharmacy*   Lab Work: (Sed rate, C-reactive protein)  If you have labs (blood  work) drawn today and your tests are completely normal, you will receive your results only by: Danielle Gaines MyChart Message (if you have MyChart) OR . A paper copy in the mail If you have any lab test that is abnormal or we need to change your treatment, we will call you to review the results.   Testing/Procedures: Your physician has requested that you have an echocardiogram. Echocardiography is a painless test that uses sound waves to create images of your heart. It provides your doctor with information about the size and shape of your heart and how well your heart's chambers and valves are working. This procedure takes approximately one hour. There are no restrictions for this procedure.  This will be done at our [facility] location:  [place] has requested that you have a lexiscan myoview. For further information please visit [email or web address]. Please follow instruction sheet, as given.   Follow-Up: At [facility], you and your health needs are our priority.  As part of our continuing mission to provide you with exceptional heart Gaines, we have created designated Provider Gaines Teams.  These Gaines Teams include your primary Cardiologist (physician) and Advanced Practice Providers (APPs -  Physician Assistants and Nurse Practitioners) who all work together to provide you with the Gaines you need, when you need it.  We recommend signing up for the patient portal called "MyChart".  Sign up information is provided on this After Visit Summary.  MyChart is used to connect with patients for Virtual Visits (Telemedicine).  Patients are able to view lab/test results, encounter notes, upcoming appointments, etc.  Non-urgent messages can be sent to your provider as well.   To learn more about what you can do with MyChart, go to [email or web address].    Your next appointment:   3 month(s)  The format for your next appointment:   In Person  Provider:   Oswaldo Milian, MD         Signed, Donato Heinz, MD  02/03/2020 2:19 PM    [place]

## 2020-02-01 NOTE — Patient Instructions (Signed)
Medication Instructions:  Your physician recommends that you continue on your current medications as directed. Please refer to the Current Medication list given to you today.  *If you need a refill on your cardiac medications before your next appointment, please call your pharmacy*   Lab Work: (Sed rate, C-reactive protein)  If you have labs (blood work) drawn today and your tests are completely normal, you will receive your results only by: Marland Kitchen MyChart Message (if you have MyChart) OR . A paper copy in the mail If you have any lab test that is abnormal or we need to change your treatment, we will call you to review the results.   Testing/Procedures: Your physician has requested that you have an echocardiogram. Echocardiography is a painless test that uses sound waves to create images of your heart. It provides your doctor with information about the size and shape of your heart and how well your heart's chambers and valves are working. This procedure takes approximately one hour. There are no restrictions for this procedure.  This will be done at our [facility] location:  [place] has requested that you have a lexiscan myoview. For further information please visit [email or web address]. Please follow instruction sheet, as given.   Follow-Up: At [facility], you and your health needs are our priority.  As part of our continuing mission to provide you with exceptional heart care, we have created designated Provider Care Teams.  These Care Teams include your primary Cardiologist (physician) and Advanced Practice Providers (APPs -  Physician Assistants and Nurse Practitioners) who all work together to provide you with the care you need, when you need it.  We recommend signing up for the patient portal called "MyChart".  Sign up information is provided on this After Visit Summary.  MyChart is used to connect with patients for Virtual Visits (Telemedicine).   Patients are able to view lab/test results, encounter notes, upcoming appointments, etc.  Non-urgent messages can be sent to your provider as well.   To learn more about what you can do with MyChart, go to [email or web address].    Your next appointment:   3 month(s)  The format for your next appointment:   In Person  Provider:   Oswaldo Milian, MD

## 2020-02-02 LAB — SEDIMENTATION RATE: Sed Rate: 6 mm/hr (ref 0–40)

## 2020-02-02 LAB — C-REACTIVE PROTEIN: CRP: <1 mg/L (ref 0–10)

## 2020-02-04 ENCOUNTER — Telehealth: Payer: Self-pay | Admitting: Obstetrics and Gynecology

## 2020-02-04 DIAGNOSIS — N952 Postmenopausal atrophic vaginitis: Secondary | ICD-10-CM

## 2020-02-04 NOTE — Telephone Encounter (Signed)
Patient's husband [name] called to speak with nurse regarding a prescription.

## 2020-02-04 NOTE — Telephone Encounter (Signed)
Left message for pt to call back to Myleen Brailsford, RN in triage. 

## 2020-02-05 ENCOUNTER — Telehealth (HOSPITAL_COMMUNITY): Payer: Self-pay

## 2020-02-05 ENCOUNTER — Telehealth: Payer: Self-pay | Admitting: Endocrinology

## 2020-02-05 MED ORDER — ESTRADIOL 10 MCG VA TABS: ORAL_TABLET | VAGINAL | 0 refills | Status: DC

## 2020-02-05 NOTE — Telephone Encounter (Signed)
[facility] called to speak with nurse. [phone]

## 2020-02-05 NOTE — Telephone Encounter (Signed)
Spoke to pt. Pt wanted to know about Rx for valisone and estradiol tabs. Pt states [facility] only giving the Valisone ointment and not the estradiol tablets. Pt will call [facility] to inquire about Rx for Estradiol and will return call to our office if has any trouble getting Rx. Pt agreeable.   Encounter closed.

## 2020-02-05 NOTE — Telephone Encounter (Signed)
Returning call to [facility]. Spoke with pharmacist, Judson Roch.  Was told by pharmacist that it was too early to fill Rx since pt just filled on 2/24/2, but  pt states did not have Rx.  Placed 1 time Rx of # 8, 0RF to get pt to next refill on regular Rx on 03/04/2020. Judson Roch will call pt to confirm Rx and give instructions as originally ordered with 1 tablet 2x a week.   Routing to Dr Talbert Nan for review and will close encounter.

## 2020-02-05 NOTE — Telephone Encounter (Signed)
Encounter complete. 

## 2020-02-05 NOTE — Telephone Encounter (Signed)
Pt has been submitted to the prolia portal SOB has come back she is clear for prolia with no PA needed Owes $15 at check in  Cannot be scheduled until after 04/30/20

## 2020-02-05 NOTE — Addendum Note (Signed)
Addended by: Georgia Lopes on: 02/05/2020 10:52 AM   Modules accepted: Orders

## 2020-02-07 ENCOUNTER — Other Ambulatory Visit: Payer: Self-pay | Admitting: *Deleted

## 2020-02-07 ENCOUNTER — Telehealth: Payer: Self-pay | Admitting: *Deleted

## 2020-02-07 DIAGNOSIS — R079 Chest pain, unspecified: Secondary | ICD-10-CM

## 2020-02-07 NOTE — Telephone Encounter (Signed)
Received message from precert-insurance denied lexiscan myoview, preferred coronary CTA or dobutamine stress echo.     Per Dr. Frazier Richards to dobutamine stress echo.   Echo canceled.    Will make patient aware.

## 2020-02-07 NOTE — Telephone Encounter (Signed)
Spoke to husband (ok per DPR) aware of recommendations and that stress test and echo have been cancelled.   Advised scheduler will call to get stress echo arranged.      He verbalized understanding.

## 2020-02-08 ENCOUNTER — Ambulatory Visit (HOSPITAL_COMMUNITY)
Admission: RE | Admit: 2020-02-08 | Payer: Medicare HMO | Source: Ambulatory Visit | Attending: Cardiology | Admitting: Cardiology

## 2020-02-20 ENCOUNTER — Other Ambulatory Visit (HOSPITAL_COMMUNITY): Payer: Medicare HMO

## 2020-02-21 ENCOUNTER — Telehealth (HOSPITAL_COMMUNITY): Payer: Self-pay

## 2020-02-21 ENCOUNTER — Ambulatory Visit: Payer: Medicare HMO | Admitting: Obstetrics and Gynecology

## 2020-02-21 NOTE — Telephone Encounter (Signed)
Attempted to contact the patient, detailed instructions were left on the patient's answering machine. Asked to call back with any questions. S.Jacody Beneke EMTP

## 2020-02-22 ENCOUNTER — Other Ambulatory Visit (HOSPITAL_COMMUNITY)
Admission: RE | Admit: 2020-02-22 | Discharge: 2020-02-22 | Disposition: A | Discharge status: Home / Self Care | Payer: Medicare HMO | Source: Ambulatory Visit | Attending: Cardiology | Admitting: Cardiology

## 2020-02-22 DIAGNOSIS — Z01812 Encounter for preprocedural laboratory examination: Secondary | ICD-10-CM | POA: Diagnosis not present

## 2020-02-22 DIAGNOSIS — Z20822 Contact with and (suspected) exposure to covid-19: Secondary | ICD-10-CM | POA: Diagnosis not present

## 2020-02-22 LAB — SARS CORONAVIRUS 2 (TAT 6-24 HRS): SARS Coronavirus 2: NEGATIVE

## 2020-02-26 ENCOUNTER — Other Ambulatory Visit: Payer: Self-pay

## 2020-02-26 ENCOUNTER — Ambulatory Visit (HOSPITAL_COMMUNITY): Payer: Medicare HMO

## 2020-02-26 ENCOUNTER — Ambulatory Visit (HOSPITAL_COMMUNITY): Payer: Medicare HMO | Attending: Cardiology

## 2020-02-26 DIAGNOSIS — R079 Chest pain, unspecified: Secondary | ICD-10-CM

## 2020-02-26 MED ORDER — SODIUM CHLORIDE 0.9 % IV SOLN
40.0000 ug/kg/min | Freq: Once | INTRAVENOUS | Status: AC
  Administered 2020-02-26: 2176 ug/min via INTRAVENOUS

## 2020-02-26 MED ORDER — PERFLUTREN LIPID MICROSPHERE
1.0000 mL | INTRAVENOUS | Status: AC | PRN
  Administered 2020-02-26: 1 mL via INTRAVENOUS
  Administered 2020-02-26: 0.5 mL via INTRAVENOUS
  Administered 2020-02-26 (×2): 1 mL via INTRAVENOUS

## 2020-02-26 MED ORDER — ATROPINE SULFATE 1 MG/10ML IJ SOSY
0.1000 mg | PREFILLED_SYRINGE | Freq: Once | INTRAMUSCULAR | Status: AC
  Administered 2020-02-26: 0.1 mg via INTRAVENOUS

## 2020-02-28 ENCOUNTER — Telehealth: Payer: Self-pay | Admitting: Cardiology

## 2020-02-28 NOTE — Telephone Encounter (Signed)
° °  Pt's husband called, he said pt had her stress everything came back normal, however, pt still in pain. They wanted to know what will be her next step. They would like to discuss it with Dr. Gardiner Rhyme  Please call

## 2020-02-29 NOTE — Telephone Encounter (Signed)
Left detailed message as noted for patient (DPR), call back w/any questions, (DPR)

## 2020-02-29 NOTE — Telephone Encounter (Signed)
Tried calling patient but no answer.  Given negative stress test and normal inflammatory markers, suspect chest pain is not coming from her heart.  Would recommend further evaluation by primary care physician for other causes of chest pain (muskuloskeletal injury, gastrointestinal issue, etc.)

## 2020-03-04 ENCOUNTER — Other Ambulatory Visit: Payer: Self-pay

## 2020-03-05 ENCOUNTER — Ambulatory Visit (INDEPENDENT_AMBULATORY_CARE_PROVIDER_SITE_OTHER): Payer: Medicare HMO | Admitting: Obstetrics and Gynecology

## 2020-03-05 ENCOUNTER — Encounter: Payer: Self-pay | Admitting: Obstetrics and Gynecology

## 2020-03-05 VITALS — BP 118/64 | HR 69 | Temp 98.1°F | Ht 61.0 in | Wt 122.0 lb

## 2020-03-05 DIAGNOSIS — N763 Subacute and chronic vulvitis: Secondary | ICD-10-CM

## 2020-03-05 DIAGNOSIS — N393 Stress incontinence (female) (male): Secondary | ICD-10-CM

## 2020-03-05 DIAGNOSIS — R159 Full incontinence of feces: Secondary | ICD-10-CM

## 2020-03-05 DIAGNOSIS — N952 Postmenopausal atrophic vaginitis: Secondary | ICD-10-CM

## 2020-03-05 MED ORDER — ESTRADIOL 10 MCG VA TABS: ORAL_TABLET | VAGINAL | 1 refills | Status: DC

## 2020-03-05 NOTE — Progress Notes (Signed)
GYNECOLOGY  VISIT   HPI: 79 y.o.   Married White or Caucasian Not Hispanic or Latino  female   G1P0001 with No LMP recorded. Patient has had a hysterectomy.   here for 4 week follow up. She says that she is still getting a brown discharge. She states that some days she itches more than other days.    She was originally seen at the end of January c/o vaginal burning, irritation and brown vaginal d/c. She was noted to have vaginal atrophy, no signs of RV fistula. Negative vaginitis panel.  She still has thin light brown vaginal d/c.  Using the vaginal estrogen 2 x a week.   H/O vulvitis, suspected from chronic pad usage. Some days she has bad itching and irritation. She is using Vaseline which helps. She is using steroid ointment 2 x a week.   H/O partial colectomy and fecal incontinence. Takes miralax to have a BM, then leaks liquid stool. She is having a BM 3 x a day to every 3 days, sometimes she can't go. Takes the miralax if not having a BM. Stools are loose, seldom solid stool. Leaks stool several times a day. Changing her pad a lot.  She is having issues with abdominal pain and bloating. She is seeing her GI MD later this week.   H/O intermittent GSI, occurs ~1x a week. Small amounts. Changes her pad when it happens.   GYNECOLOGIC HISTORY: No LMP recorded. Patient has had a hysterectomy. Contraception:PMP Menopausal hormone therapy: yes, estradiol         OB History    Gravida  1   Para  1   Term  0   Preterm  0   AB  0   Living  1     SAB  0   TAB  0   Ectopic  0   Multiple  0   Live Births  0              Patient Active Problem List   Diagnosis Date Noted  . Excessive sweating 06/13/2019  . Chronic kidney disease (CKD), stage III (moderate) 08/01/2018  . Anemia, chronic renal failure 08/01/2018  . Chronic pain 11/19/2016  . Orthostatic hypotension 11/19/2016  . MDD (major depressive disorder), recurrent severe, without psychosis ([place]) 04/09/2015  .  Osteoporosis 07/24/2014  . Migraine - followed by Dr. Sima Matas in Neurology 05/24/2014  . Anxiety and depression - managed at [facility] 05/24/2014  . GERD (gastroesophageal reflux disease) 05/24/2014  . Macular degeneration - [facility] to [facility] for [facility] 05/24/2014    Past Medical History:  Diagnosis Date  . Arthritis   . Benzodiazepine dependence ([place])   . Carotid arterial disease (HCC)    mild  . Chronic kidney disease (CKD), stage III (moderate) 08/01/2018   -seeing  Nephrology, Dr. Posey Pronto, thought from [place] per renal notes   . Chronic pain    managed by pain clinic  . Colon abnormality    didn't work right so part of it was removed at [facility]  . Depression    managed by Dr. Toy Care  . Diastolic heart failure ([place])    mild on echo 2016  . GERD (gastroesophageal reflux disease)   . IBS (irritable bowel syndrome)    sees Dr. Earlean Shawl  . Macular degeneration    goes to [facility] for this  . Migraine    managed by neurologist  . Osteoporosis   . Vertigo     Past Surgical History:  Procedure Laterality Date  . ABDOMINAL ADHESION SURGERY    . ABDOMINAL HYSTERECTOMY    . APPENDECTOMY    . CHOLECYSTECTOMY    . COLON SURGERY    . CYSTOSCOPY W/ URETERAL STENT PLACEMENT Right 04/27/2016   Procedure: CYSTOSCOPY WITH RETROGRADE PYELOGRAM/ RIGHT URETERAL STENT PLACEMENT;  Surgeon: Ardis Hughs, MD;  Location: [facility];  Service: Urology;  Laterality: Right;  . OVARIAN CYST SURGERY    . SMALL INTESTINE SURGERY      Current Outpatient Medications  Medication Sig Dispense Refill  . acetaminophen (TYLENOL) 500 MG tablet Take 1,000 mg by mouth daily as needed for headache.    . alendronate (FOSAMAX) 70 MG tablet Take 1 tablet (70 mg total) by mouth once a week. TAKE 1 TABLET BY MOUTH ONCE WEEKLY WITH A FULL GLASS OF WATER ON AN EMPTY STOMACH 12 tablet 2  . betamethasone valerate ointment (VALISONE) 0.1 % Apply a pea sized amount 2 x a week as needed 30 g 0  . Biotin 1000 MCG tablet  Take 1,000 mcg by mouth daily.    . Calcium Carbonate (CALTRATE 600 PO) Take by mouth daily.    . citalopram (CELEXA) 20 MG tablet TK 1 T PO D  2  . cycloSPORINE (RESTASIS) 0.05 % ophthalmic emulsion Place 1 drop into both eyes 2 (two) times daily.     Marland Kitchen denosumab (PROLIA) 60 MG/ML SOSY injection Inject 60 mg into the skin every 6 (six) months.    . dorzolamide-timolol (COSOPT) 22.3-6.8 MG/ML ophthalmic solution Place 1 drop into both eyes 2 times daily.    . Estradiol 10 MCG TABS vaginal tablet One tablet vaginally daily. 8 tablet 0  . folic acid (FOLVITE) 1 MG tablet Take by mouth.    . furosemide (LASIX) 40 MG tablet Take 1 tablet (40 mg total) by mouth daily. (from nephrology) 30 tablet 3  . Galcanezumab-gnlm (EMGALITY) 120 MG/ML SOAJ Inject into the skin.    Marland Kitchen ipratropium (ATROVENT) 0.03 % nasal spray Place 2 sprays into both nostrils 3 (three) times daily. 30 mL 0  . LINZESS 290 MCG CAPS capsule Take 290 mcg by mouth daily before breakfast.   0  . LORazepam (ATIVAN) 0.5 MG tablet Take 0.5 mg by mouth 3 (three) times daily as needed for anxiety.    Marland Kitchen olopatadine (PATANOL) 0.1 % ophthalmic solution 1 drop 2 (two) times daily.    Marland Kitchen omeprazole (PRILOSEC) 40 MG capsule Take 80 mg by mouth daily.   0  . Probiotic Product (ALIGN PO) Take by mouth daily.    . sodium bicarbonate 650 MG tablet TK 1 T PO BID  9  . SUMAtriptan (IMITREX) 100 MG tablet Take 100 mg by mouth every 2 (two) hours as needed for migraine.     . topiramate (TOPAMAX) 100 MG tablet Take 3 tablets at bedtime    . tretinoin (RETIN-A) 0.025 % cream APPLY A PEA SIZED AMOUNT TO FACE ONCE DAILY IN THE EVENING    . UBRELVY 100 MG TABS     . VITAMIN D PO Take by mouth daily.    Marland Kitchen ZENPEP 15000 units CPEP Take 45,000 mg by mouth 2 (two) times daily.   0   No current facility-administered medications for this visit.     ALLERGIES: Sulfa antibiotics  Family History  Problem Relation Age of Onset  . Kidney disease Mother   . Heart  disease Mother   . Osteoporosis Mother   . Heart Problems Sister   .  Cancer Maternal Aunt        intestinal and liver cancer  . Stroke Maternal Grandmother   . Heart disease Maternal Grandmother   . Stroke Other   . Depression Other        everyone  . Hypercalcemia Neg Hx   . Breast cancer Neg Hx     Social History   Socioeconomic History  . Marital status: Married    Spouse name: Not on file  . Number of children: Not on file  . Years of education: Not on file  . Highest education level: Not on file  Occupational History  . Not on file  Tobacco Use  . Smoking status: Never Smoker  . Smokeless tobacco: Never Used  Substance and Sexual Activity  . Alcohol use: No  . Drug use: No  . Sexual activity: Not Currently    Birth control/protection: Post-menopausal  Other Topics Concern  . Not on file  Social History Narrative   Work or School: none      Home Situation: lives with husband      Spiritual Beliefs:       Lifestyle: no regular exercise; diet is ok         Social Determinants of Radio broadcast assistant Strain:   . Difficulty of Paying Living Expenses:   Food Insecurity:   . Worried About Charity fundraiser in the Last Year:   . Arboriculturist in the Last Year:   Transportation Needs:   . Film/video editor (Medical):   Marland Kitchen Lack of Transportation (Non-Medical):   Physical Activity:   . Days of Exercise per Week:   . Minutes of Exercise per Session:   Stress:   . Feeling of Stress :   Social Connections:   . Frequency of Communication with Friends and Family:   . Frequency of Social Gatherings with Friends and Family:   . Attends Religious Services:   . Active Member of Clubs or Organizations:   . Attends Archivist Meetings:   Marland Kitchen Marital Status:   Intimate Partner Violence:   . Fear of Current or Ex-Partner:   . Emotionally Abused:   Marland Kitchen Physically Abused:   . Sexually Abused:     Review of Systems  Genitourinary:        Vaginal discharge  Endo/Heme/Allergies: Positive for environmental allergies.  All other systems reviewed and are negative.   PHYSICAL EXAMINATION:    BP 118/64   Pulse 69   Temp 98.1 F (36.7 C)   Ht 5\' 1"  (1.549 m)   Wt 122 lb (55.3 kg)   SpO2 94%   BMI 23.05 kg/m     General appearance: alert, cooperative and appears stated age   Pelvic: External genitalia:  no lesions, +erythema. No whitening, no plaques, no fissures.               Urethra:  normal appearing urethra with no masses, tenderness or lesions              Bartholins and Skenes: normal                 Vagina: mildly atrophic appearing vagina with mild erythema and a slight amount of creamy white vaginal discharge              Cervix: absent              Anus: few small tags. Mild perianal erythema (improved)  Chaperone was  present for exam.  ASSESSMENT Atrophic vaginitis, improved Vulvitis, recently worsening. I think some of her issue is chronic irritation from pads Fecal incontinence only of liquid stool. She will discuss with GI    PLAN Continue vaginal estrogen Increase steroid ointment to BID for one week Vaginitis panel sent, concern for possible yeast F/U with GI

## 2020-03-07 DIAGNOSIS — R109 Unspecified abdominal pain: Secondary | ICD-10-CM | POA: Diagnosis not present

## 2020-03-07 DIAGNOSIS — R140 Abdominal distension (gaseous): Secondary | ICD-10-CM | POA: Diagnosis not present

## 2020-03-07 DIAGNOSIS — G8929 Other chronic pain: Secondary | ICD-10-CM | POA: Diagnosis not present

## 2020-03-07 DIAGNOSIS — R159 Full incontinence of feces: Secondary | ICD-10-CM | POA: Diagnosis not present

## 2020-03-07 DIAGNOSIS — R0789 Other chest pain: Secondary | ICD-10-CM | POA: Diagnosis not present

## 2020-03-07 DIAGNOSIS — K5900 Constipation, unspecified: Secondary | ICD-10-CM | POA: Diagnosis not present

## 2020-03-08 LAB — NUSWAB BV AND CANDIDA, NAA
Candida albicans, NAA: POSITIVE — AB
Candida glabrata, NAA: NEGATIVE

## 2020-03-10 ENCOUNTER — Telehealth: Payer: Self-pay | Admitting: *Deleted

## 2020-03-10 MED ORDER — TERCONAZOLE 80 MG VA SUPP: 80.0000 mg | Freq: Every day | VAGINAL | 0 refills | Status: DC

## 2020-03-10 NOTE — Telephone Encounter (Signed)
-----   Message from Salvadore Dom, MD sent at 03/09/2020  3:07 PM EDT ----- Please let the patient know that her culture returned + for yeast and call in terazol suppositories, place one vaginally qhs x 3 nights.  Given her renal function the vaginal route is better than oral route.

## 2020-03-10 NOTE — Telephone Encounter (Signed)
Call to patient. Results reviewed with patient and she verbalized understanding. Instructions on use of terazol suppositories reviewed with patient and she verbalized understanding. Pharm[facility] as [facility].   Prescription for terazol suppository #3, 0RF. Sent to confirmed pharmacy on file.

## 2020-03-13 DIAGNOSIS — Z1152 Encounter for screening for COVID-19: Secondary | ICD-10-CM | POA: Diagnosis not present

## 2020-03-17 DIAGNOSIS — K3189 Other diseases of stomach and duodenum: Secondary | ICD-10-CM | POA: Diagnosis not present

## 2020-03-17 DIAGNOSIS — Z1211 Encounter for screening for malignant neoplasm of colon: Secondary | ICD-10-CM | POA: Diagnosis not present

## 2020-03-17 DIAGNOSIS — R0789 Other chest pain: Secondary | ICD-10-CM | POA: Diagnosis not present

## 2020-03-17 DIAGNOSIS — K624 Stenosis of anus and rectum: Secondary | ICD-10-CM | POA: Diagnosis not present

## 2020-03-17 DIAGNOSIS — R109 Unspecified abdominal pain: Secondary | ICD-10-CM | POA: Diagnosis not present

## 2020-03-17 DIAGNOSIS — Z9889 Other specified postprocedural states: Secondary | ICD-10-CM | POA: Diagnosis not present

## 2020-03-17 DIAGNOSIS — K219 Gastro-esophageal reflux disease without esophagitis: Secondary | ICD-10-CM | POA: Diagnosis not present

## 2020-03-17 DIAGNOSIS — Z8711 Personal history of peptic ulcer disease: Secondary | ICD-10-CM | POA: Diagnosis not present

## 2020-03-17 DIAGNOSIS — Z9049 Acquired absence of other specified parts of digestive tract: Secondary | ICD-10-CM | POA: Diagnosis not present

## 2020-03-17 DIAGNOSIS — R1084 Generalized abdominal pain: Secondary | ICD-10-CM | POA: Diagnosis not present

## 2020-03-17 DIAGNOSIS — Z980 Intestinal bypass and anastomosis status: Secondary | ICD-10-CM | POA: Diagnosis not present

## 2020-03-17 LAB — HM SIGMOIDOSCOPY

## 2020-03-18 ENCOUNTER — Encounter: Payer: Self-pay | Admitting: Family Medicine

## 2020-04-01 DIAGNOSIS — H3581 Retinal edema: Secondary | ICD-10-CM | POA: Diagnosis not present

## 2020-04-01 DIAGNOSIS — H18599 Other hereditary corneal dystrophies, unspecified eye: Secondary | ICD-10-CM | POA: Diagnosis not present

## 2020-04-01 DIAGNOSIS — H35371 Puckering of macula, right eye: Secondary | ICD-10-CM | POA: Diagnosis not present

## 2020-04-01 DIAGNOSIS — H348332 Tributary (branch) retinal vein occlusion, bilateral, stable: Secondary | ICD-10-CM | POA: Diagnosis not present

## 2020-04-01 DIAGNOSIS — Z961 Presence of intraocular lens: Secondary | ICD-10-CM | POA: Diagnosis not present

## 2020-04-01 DIAGNOSIS — H44113 Panuveitis, bilateral: Secondary | ICD-10-CM | POA: Diagnosis not present

## 2020-04-01 DIAGNOSIS — H30033 Focal chorioretinal inflammation, peripheral, bilateral: Secondary | ICD-10-CM | POA: Diagnosis not present

## 2020-04-01 DIAGNOSIS — H35342 Macular cyst, hole, or pseudohole, left eye: Secondary | ICD-10-CM | POA: Diagnosis not present

## 2020-04-01 DIAGNOSIS — Z9889 Other specified postprocedural states: Secondary | ICD-10-CM | POA: Diagnosis not present

## 2020-04-04 DIAGNOSIS — K5900 Constipation, unspecified: Secondary | ICD-10-CM | POA: Diagnosis not present

## 2020-04-04 DIAGNOSIS — K219 Gastro-esophageal reflux disease without esophagitis: Secondary | ICD-10-CM | POA: Diagnosis not present

## 2020-04-04 DIAGNOSIS — K8681 Exocrine pancreatic insufficiency: Secondary | ICD-10-CM | POA: Diagnosis not present

## 2020-04-14 NOTE — Telephone Encounter (Signed)
Called and left voicemail for pt requesting that she call back and schedule nurse visit for Prolia sometime after 04/30/20

## 2020-04-17 ENCOUNTER — Telehealth: Payer: Self-pay

## 2020-04-17 NOTE — Telephone Encounter (Signed)
Patients husband is calling in regards to a medication refill of Terconazole. Patients husband states they have called the pharmacy for a refill request and the pharmacy states they have sent a request to our office twice.

## 2020-04-17 NOTE — Telephone Encounter (Signed)
Last OV 03/05/20 for atrophic vaginitis  H/O hysterectomy  Pt wears incontinence pad  +yeast 03/09/20- treated with Terazol vag.suppostitories.  H/O chronic kidney disease stage III  Spoke with pt's husband [name] per [employer]. States pt reports having vaginal itching and discharge x 1 week. Husband unsure of discharge color.  Denies fever, chills, back pain, UTI sx or vaginal irritation or bleeding. Advised for pt to have OV for further evaluation. Husband agreeable. Offered OV with another provider and earlier appt, husband states wife wants to see Dr Talbert Nan. Pt scheduled with Dr Talbert Nan on 04/22/20 at 2pm. Husband agreeable and verbalized understanding.  Advised ER precautions if sx worsen over weekend. Agreeable.  Routing to Dr Talbert Nan for review.  Encounter closed.

## 2020-04-21 ENCOUNTER — Other Ambulatory Visit: Payer: Self-pay

## 2020-04-22 ENCOUNTER — Encounter: Payer: Self-pay | Admitting: Obstetrics and Gynecology

## 2020-04-22 ENCOUNTER — Ambulatory Visit: Payer: Medicare HMO | Admitting: Obstetrics and Gynecology

## 2020-04-22 VITALS — BP 122/70 | HR 72 | Temp 98.0°F | Resp 14 | Ht 61.0 in | Wt 118.6 lb

## 2020-04-22 DIAGNOSIS — N94819 Vulvodynia, unspecified: Secondary | ICD-10-CM | POA: Diagnosis not present

## 2020-04-22 DIAGNOSIS — N763 Subacute and chronic vulvitis: Secondary | ICD-10-CM

## 2020-04-22 DIAGNOSIS — R159 Full incontinence of feces: Secondary | ICD-10-CM | POA: Diagnosis not present

## 2020-04-22 DIAGNOSIS — N952 Postmenopausal atrophic vaginitis: Secondary | ICD-10-CM

## 2020-04-22 DIAGNOSIS — N898 Other specified noninflammatory disorders of vagina: Secondary | ICD-10-CM

## 2020-04-22 MED ORDER — LIDOCAINE 5 % EX OINT: TOPICAL_OINTMENT | CUTANEOUS | 0 refills | Status: DC

## 2020-04-22 NOTE — Patient Instructions (Signed)
Increase the vaginal estrogen to 3 x a week for the next 2 weeks  Use the lidocaine ointment topically 20-30 minutes prior to inserting the estrogen  Use Vaseline externally  When possible don't wear a pad, let the area breathe.

## 2020-04-22 NOTE — Progress Notes (Signed)
GYNECOLOGY  VISIT   HPI: 79 y.o.   Married White or Caucasian Not Hispanic or Latino  female   G1P0001 with No LMP recorded. Patient has had a hysterectomy.   here for brown vaginal discharge and vaginal itching.   She was originally seen at the end of January c/o vaginal burning, irritation and brown vaginal d/c. She was noted to have vaginal atrophy, no signs of RV fistula. Negative vaginitis panel in January.  She was started on vaginal estrogen and is using it 2 x a week. It hurts to put the tablets in her vagina. She has been covering the tablet/inserter with vaseline.   H/O vulvitis/dermatitis likely from chronic pad usage. H/O partial colectomy and fecal incontinence. Also has rare GSI.  In 4/21 she was treated for yeast vaginitis.   Her d/c improved for several months, worse again in the last few weeks. Brown watery vaginal discharge. Her itching is helped with steroid ointment or neosporin. Using the steroid ointment 2 x a week. The itching is bothering her more than the discharge.   Dr Earlean Shawl did a colonoscopy and endoscopy. She has abdominal pain from suspected adhesions. He doesn't want to change any of her medications.  She is having 3-5 BM's a day, she is on miralax. If she doesn't take the miralax she gets constipation. Some liquid stool, rarely has a solid stool. Leaking liquid stool several times a day, small amounts.  GYNECOLOGIC HISTORY: No LMP recorded. Patient has had a hysterectomy. Contraception:none Menopausal hormone therapy: estradiol         OB History    Gravida  1   Para  1   Term  0   Preterm  0   AB  0   Living  1     SAB  0   TAB  0   Ectopic  0   Multiple  0   Live Births  0              Patient Active Problem List   Diagnosis Date Noted  . Excessive sweating 06/13/2019  . Chronic kidney disease (CKD), stage III (moderate) 08/01/2018  . Anemia, chronic renal failure 08/01/2018  . Chronic pain 11/19/2016  . Orthostatic  hypotension 11/19/2016  . MDD (major depressive disorder), recurrent severe, without psychosis ([place]) 04/09/2015  . Osteoporosis 07/24/2014  . Migraine - followed by Dr. Sima Matas in Neurology 05/24/2014  . Anxiety and depression - managed at [facility] 05/24/2014  . GERD (gastroesophageal reflux disease) 05/24/2014  . Macular degeneration - goes to [facility] for [facility] 05/24/2014    Past Medical History:  Diagnosis Date  . Arthritis   . Benzodiazepine dependence ([place])   . Carotid arterial disease (HCC)    mild  . Chronic kidney disease (CKD), stage III (moderate) 08/01/2018   -seeing  Nephrology, Dr. Posey Pronto, thought from [place] per renal notes   . Chronic pain    managed by pain clinic  . Colon abnormality    didn't work right so part of it was removed at [facility]  . Depression    managed by Dr. Toy Care  . Diastolic heart failure ([place])    mild on echo 2016  . GERD (gastroesophageal reflux disease)   . IBS (irritable bowel syndrome)    sees Dr. Earlean Shawl  . Macular degeneration    goes to [facility] for this  . Migraine    managed by neurologist  . Osteoporosis   . Vertigo     Past  Surgical History:  Procedure Laterality Date  . ABDOMINAL ADHESION SURGERY    . ABDOMINAL HYSTERECTOMY    . APPENDECTOMY    . CHOLECYSTECTOMY    . COLON SURGERY    . CYSTOSCOPY W/ URETERAL STENT PLACEMENT Right 04/27/2016   Procedure: CYSTOSCOPY WITH RETROGRADE PYELOGRAM/ RIGHT URETERAL STENT PLACEMENT;  Surgeon: Ardis Hughs, MD;  Location: [facility];  Service: Urology;  Laterality: Right;  . OVARIAN CYST SURGERY    . SMALL INTESTINE SURGERY      Current Outpatient Medications  Medication Sig Dispense Refill  . acetaminophen (TYLENOL) 500 MG tablet Take 1,000 mg by mouth daily as needed for headache.    . alendronate (FOSAMAX) 70 MG tablet Take 1 tablet (70 mg total) by mouth once a week. TAKE 1 TABLET BY MOUTH ONCE WEEKLY WITH A FULL GLASS OF WATER ON AN EMPTY STOMACH 12 tablet 2  . betamethasone  valerate ointment (VALISONE) 0.1 % Apply a pea sized amount 2 x a week as needed 30 g 0  . Biotin 1000 MCG tablet Take 1,000 mcg by mouth daily.    . Calcium Carbonate (CALTRATE 600 PO) Take by mouth daily.    . citalopram (CELEXA) 20 MG tablet TK 1 T PO D  2  . cycloSPORINE (RESTASIS) 0.05 % ophthalmic emulsion Place 1 drop into both eyes 2 (two) times daily.     Marland Kitchen denosumab (PROLIA) 60 MG/ML SOSY injection Inject 60 mg into the skin every 6 (six) months.    . dorzolamide-timolol (COSOPT) 22.3-6.8 MG/ML ophthalmic solution Place 1 drop into both eyes 2 times daily.    . Estradiol 10 MCG TABS vaginal tablet One tablet vaginally daily. 24 tablet 1  . folic acid (FOLVITE) 1 MG tablet Take by mouth.    . furosemide (LASIX) 40 MG tablet Take 1 tablet (40 mg total) by mouth daily. (from nephrology) 30 tablet 3  . Galcanezumab-gnlm (EMGALITY) 120 MG/ML SOAJ Inject into the skin.    Marland Kitchen ipratropium (ATROVENT) 0.03 % nasal spray Place 2 sprays into both nostrils 3 (three) times daily. 30 mL 0  . LINZESS 290 MCG CAPS capsule Take 290 mcg by mouth daily before breakfast.   0  . LORazepam (ATIVAN) 0.5 MG tablet Take 0.5 mg by mouth 3 (three) times daily as needed for anxiety.    Marland Kitchen olopatadine (PATANOL) 0.1 % ophthalmic solution 1 drop 2 (two) times daily.    Marland Kitchen omeprazole (PRILOSEC) 40 MG capsule Take 80 mg by mouth daily.   0  . Probiotic Product (ALIGN PO) Take by mouth daily.    . sodium bicarbonate 650 MG tablet TK 1 T PO BID  9  . SUMAtriptan (IMITREX) 100 MG tablet Take 100 mg by mouth every 2 (two) hours as needed for migraine.     Marland Kitchen terconazole (TERAZOL 3) 80 MG vaginal suppository Place 1 suppository (80 mg total) vaginally at bedtime. 3 suppository 0  . topiramate (TOPAMAX) 100 MG tablet Take 3 tablets at bedtime    . tretinoin (RETIN-A) 0.025 % cream APPLY A PEA SIZED AMOUNT TO FACE ONCE DAILY IN THE EVENING    . UBRELVY 100 MG TABS     . VITAMIN D PO Take by mouth daily.    Marland Kitchen ZENPEP 15000 units  CPEP Take 45,000 mg by mouth 2 (two) times daily.   0   No current facility-administered medications for this visit.     ALLERGIES: Sulfa antibiotics  Family History  Problem Relation Age of  Onset  . Kidney disease Mother   . Heart disease Mother   . Osteoporosis Mother   . Heart Problems Sister   . Cancer Maternal Aunt        intestinal and liver cancer  . Stroke Maternal Grandmother   . Heart disease Maternal Grandmother   . Stroke Other   . Depression Other        everyone  . Hypercalcemia Neg Hx   . Breast cancer Neg Hx     Social History   Socioeconomic History  . Marital status: Married    Spouse name: Not on file  . Number of children: Not on file  . Years of education: Not on file  . Highest education level: Not on file  Occupational History  . Not on file  Tobacco Use  . Smoking status: Never Smoker  . Smokeless tobacco: Never Used  Substance and Sexual Activity  . Alcohol use: No  . Drug use: No  . Sexual activity: Not Currently    Birth control/protection: Post-menopausal  Other Topics Concern  . Not on file  Social History Narrative   Work or School: none      Home Situation: lives with husband      Spiritual Beliefs:       Lifestyle: no regular exercise; diet is ok         Social Determinants of Radio broadcast assistant Strain:   . Difficulty of Paying Living Expenses:   Food Insecurity:   . Worried About Charity fundraiser in the Last Year:   . Arboriculturist in the Last Year:   Transportation Needs:   . Film/video editor (Medical):   Marland Kitchen Lack of Transportation (Non-Medical):   Physical Activity:   . Days of Exercise per Week:   . Minutes of Exercise per Session:   Stress:   . Feeling of Stress :   Social Connections:   . Frequency of Communication with Friends and Family:   . Frequency of Social Gatherings with Friends and Family:   . Attends Religious Services:   . Active Member of Clubs or Organizations:   . Attends  Archivist Meetings:   Marland Kitchen Marital Status:   Intimate Partner Violence:   . Fear of Current or Ex-Partner:   . Emotionally Abused:   Marland Kitchen Physically Abused:   . Sexually Abused:     Review of Systems  Genitourinary:       Vaginal discharge and itching.   All other systems reviewed and are negative.   PHYSICAL EXAMINATION:    There were no vitals taken for this visit.    General appearance: alert, cooperative and appears stated age   Pelvic: External genitalia:  no lesions, atrophic. Tender with palpation of the introitus with a cotton swab with lubrication on it.               Urethra:  normal appearing urethra with no masses, tenderness or lesions              Bartholins and Skenes: normal                 Vagina:atrophic appearing vagina with a slight increase in watery, brown vaginal d/c (negative guaiac test)               Cervix: absent              Bimanual Exam:  Uterus:  uterus absent  Adnexa: no mass, fullness, tenderness               Chaperone was present for exam.  ASSESSMENT Vaginal discharge, suspect from atrophy. She is using her vaginal estrogen 2 x a week (but painful). Vulvitis, chronic.     PLAN Send vaginitis panel Increase her vaginal estrogen to 3 x a week for the next 2 weeks, then f/u Will have her place lidocaine ointment topically 20-30 minutes prior to vaginal estrogen insertion to help with discomfort. Don't put Vaseline on the vaginal estrogen.  Can use Vaseline daily Only use steroid ointment 2 x a week Try to avoid pads when she can   An After Visit Summary was printed and given to the patient.  ~25 minutes in total patient care.

## 2020-04-24 LAB — NUSWAB BV AND CANDIDA, NAA
Candida albicans, NAA: NEGATIVE
Candida glabrata, NAA: NEGATIVE

## 2020-04-29 DIAGNOSIS — N183 Chronic kidney disease, stage 3 unspecified: Secondary | ICD-10-CM | POA: Diagnosis not present

## 2020-05-05 ENCOUNTER — Ambulatory Visit: Payer: Medicare HMO | Admitting: Cardiology

## 2020-05-06 ENCOUNTER — Telehealth: Payer: Self-pay

## 2020-05-06 NOTE — Telephone Encounter (Signed)
Patients husband is calling to cancel patients office visit for (05/09/20). Patients husband stated patient would like to reschedule. No available slots, need triage to assist.

## 2020-05-06 NOTE — Telephone Encounter (Signed)
Spoke with husband [name] per [employer]. Pt needs to reschedule due to husband's work schedule.  Pt rescheduled with Dr Talbert Nan for follow up on vaginal estrogen from [place] on 04/22/20 and now scheduled on 05/20/20 at 3pm  per request of date and time. Husband agreeable and verablized understanding.  Routing to Dr Talbert Nan for review. Encounter closed.

## 2020-05-07 ENCOUNTER — Ambulatory Visit: Payer: Medicare HMO

## 2020-05-08 ENCOUNTER — Ambulatory Visit: Payer: Medicare HMO

## 2020-05-09 ENCOUNTER — Ambulatory Visit: Payer: Medicare HMO | Admitting: Obstetrics and Gynecology

## 2020-05-09 DIAGNOSIS — H903 Sensorineural hearing loss, bilateral: Secondary | ICD-10-CM | POA: Diagnosis not present

## 2020-05-09 DIAGNOSIS — Z974 Presence of external hearing-aid: Secondary | ICD-10-CM | POA: Diagnosis not present

## 2020-05-09 DIAGNOSIS — H9201 Otalgia, right ear: Secondary | ICD-10-CM | POA: Insufficient documentation

## 2020-05-09 DIAGNOSIS — R42 Dizziness and giddiness: Secondary | ICD-10-CM | POA: Diagnosis not present

## 2020-05-11 ENCOUNTER — Other Ambulatory Visit: Payer: Self-pay | Admitting: Endocrinology

## 2020-05-11 DIAGNOSIS — M810 Age-related osteoporosis without current pathological fracture: Secondary | ICD-10-CM

## 2020-05-16 ENCOUNTER — Telehealth: Payer: Self-pay

## 2020-05-16 ENCOUNTER — Other Ambulatory Visit: Payer: Self-pay

## 2020-05-16 NOTE — Telephone Encounter (Signed)
PA initiated via [email or web address] for Prolia.  Lemmie Evens Key: [id number] - PA Case ID: [id number] Need help? Call us at [phone] Status Sent to [place] 60MG /ML syringes Form Nurse, adult and Medical Benefit PA Form

## 2020-05-19 ENCOUNTER — Telehealth: Payer: Self-pay

## 2020-05-19 NOTE — Telephone Encounter (Signed)
Patients husband called to cancel appointment for tomorrow due to patient not feeling well. Patient will call back to reschedule when feeling better.

## 2020-05-20 ENCOUNTER — Ambulatory Visit: Payer: Medicare HMO | Admitting: Obstetrics and Gynecology

## 2020-05-21 NOTE — Telephone Encounter (Signed)
PA Approved:  Approvedon June 18 PA Case: [id number], Status: Approved, Coverage Starts on: 05/16/2020 12:00:00 AM, Coverage Ends on: 11/28/2020 12:00:00 AM. Questions? Contact [phone].

## 2020-05-22 ENCOUNTER — Ambulatory Visit: Payer: Medicare HMO

## 2020-05-23 NOTE — Telephone Encounter (Signed)
Summary of benefits states that the patient does require a PA, but this has been initiated and approved. After this, patient owes 20% of allowable amount which is approx $240 plus a $25 administration fee for an approx total of $265.  Pt is ready to be scheduled and owes $265 at check in.

## 2020-05-28 ENCOUNTER — Telehealth: Payer: Self-pay

## 2020-05-28 NOTE — Telephone Encounter (Signed)
Spoke with patient. Patient states that she does not need a refill of Terconazole suppositories. Completed the one time dose of the 3 suppositories given on 03/10/2020. Patient is due for a follow up appointment with Dr.Jertson that she had to cancel due to being sick. Patient is aware and would like to have her husband return call to reschedule her follow up appointment. Patient to call with any questions or concerns before follow up appointment.  Routing to provider and will close encounter.

## 2020-05-28 NOTE — Telephone Encounter (Signed)
Patient returned call

## 2020-05-28 NOTE — Telephone Encounter (Signed)
Left message to call [facility] at [phone].  Calling in regards to refill request for Terconazole vaginal suppositories sent by [place].

## 2020-05-29 ENCOUNTER — Ambulatory Visit: Payer: Medicare HMO

## 2020-06-11 ENCOUNTER — Ambulatory Visit: Payer: Medicare HMO

## 2020-06-13 ENCOUNTER — Ambulatory Visit: Payer: Medicare Other | Admitting: Endocrinology

## 2020-06-24 ENCOUNTER — Ambulatory Visit: Payer: Medicare HMO

## 2020-06-26 ENCOUNTER — Ambulatory Visit: Payer: Medicare HMO | Admitting: Endocrinology

## 2020-07-01 ENCOUNTER — Other Ambulatory Visit: Payer: Self-pay

## 2020-07-01 ENCOUNTER — Ambulatory Visit: Payer: Medicare HMO

## 2020-07-01 DIAGNOSIS — M810 Age-related osteoporosis without current pathological fracture: Secondary | ICD-10-CM

## 2020-07-01 MED ORDER — DENOSUMAB 60 MG/ML ~~LOC~~ SOSY
60.0000 mg | PREFILLED_SYRINGE | Freq: Once | SUBCUTANEOUS | Status: AC
Start: 2020-07-01 — End: 2020-07-01
  Administered 2020-07-01: 60 mg via SUBCUTANEOUS

## 2020-07-01 NOTE — Progress Notes (Signed)
Pt presents today for nurse visit for an injection. Per Dr. Ellison, injection of Prolia given SQ in L arm today by A. Syrina Wake, LPN. Denies any adverse reactions or discomfort.  

## 2020-07-08 DIAGNOSIS — H903 Sensorineural hearing loss, bilateral: Secondary | ICD-10-CM | POA: Diagnosis not present

## 2020-07-08 DIAGNOSIS — H9203 Otalgia, bilateral: Secondary | ICD-10-CM | POA: Diagnosis not present

## 2020-07-08 DIAGNOSIS — M2669 Other specified disorders of temporomandibular joint: Secondary | ICD-10-CM | POA: Diagnosis not present

## 2020-07-08 DIAGNOSIS — R040 Epistaxis: Secondary | ICD-10-CM | POA: Diagnosis not present

## 2020-07-15 ENCOUNTER — Telehealth: Payer: Self-pay

## 2020-07-15 NOTE — Telephone Encounter (Signed)
Spoke with patient. Patient reports continued intermittent external vaginal itching and brown vaginal d/c. Was seen in office on 5/25, reports no change in symptoms. Hx of hysterectomy.  Reports pain when trying to insert estradiol vaginal tablets, has been trying to insert with Vaseline. Applies valisone ointment externally prn and this helps with the itching. Reports voiding small amounts, is scheduled to see nephrologist on 8/25 for this. Denies dysuria, flank pain, hematuria, vaginal odor, fever/chills, N/V, vaginal bleeding or pelvic pain.   Advised OV recommended for further evaluation, patient declined. Patient sates she was not aware of AEX scheduled for 09/04/20, wants to wait and discuss at that visit. Strongly encouraged earlier OV, patient again declined. Patient states she will contact the office if new symptoms develop or symptoms worsen. Advised patient to stop vaginal estradiol if experiencing pain with insertion until evaluation. Patient verbalizes understanding, thankful for call.   Routing to provider for final review.  Will close encounter.

## 2020-07-15 NOTE — Telephone Encounter (Signed)
Call to patient at [phone]. Left message to call Sharee Pimple, RN at [facility].

## 2020-07-15 NOTE — Telephone Encounter (Signed)
Patients husband is calling to see if patient needs appointment. Patients husband stated that the patient is having recurring problems. Patients husband stated to give patient a call at [phone].

## 2020-07-17 DIAGNOSIS — G43709 Chronic migraine without aura, not intractable, without status migrainosus: Secondary | ICD-10-CM | POA: Diagnosis not present

## 2020-07-18 DIAGNOSIS — N1832 Chronic kidney disease, stage 3b: Secondary | ICD-10-CM | POA: Diagnosis not present

## 2020-07-18 DIAGNOSIS — N2581 Secondary hyperparathyroidism of renal origin: Secondary | ICD-10-CM | POA: Diagnosis not present

## 2020-07-18 DIAGNOSIS — N189 Chronic kidney disease, unspecified: Secondary | ICD-10-CM | POA: Diagnosis not present

## 2020-07-23 DIAGNOSIS — N1831 Chronic kidney disease, stage 3a: Secondary | ICD-10-CM | POA: Diagnosis not present

## 2020-07-23 DIAGNOSIS — N2581 Secondary hyperparathyroidism of renal origin: Secondary | ICD-10-CM | POA: Diagnosis not present

## 2020-07-23 DIAGNOSIS — D631 Anemia in chronic kidney disease: Secondary | ICD-10-CM | POA: Diagnosis not present

## 2020-07-24 DIAGNOSIS — G43711 Chronic migraine without aura, intractable, with status migrainosus: Secondary | ICD-10-CM | POA: Diagnosis not present

## 2020-07-25 ENCOUNTER — Other Ambulatory Visit: Payer: Self-pay | Admitting: Family Medicine

## 2020-07-25 DIAGNOSIS — Z1231 Encounter for screening mammogram for malignant neoplasm of breast: Secondary | ICD-10-CM

## 2020-08-08 DIAGNOSIS — Z9889 Other specified postprocedural states: Secondary | ICD-10-CM | POA: Diagnosis not present

## 2020-08-08 DIAGNOSIS — L718 Other rosacea: Secondary | ICD-10-CM | POA: Diagnosis not present

## 2020-08-08 DIAGNOSIS — H0288A Meibomian gland dysfunction right eye, upper and lower eyelids: Secondary | ICD-10-CM | POA: Diagnosis not present

## 2020-08-08 DIAGNOSIS — Z961 Presence of intraocular lens: Secondary | ICD-10-CM | POA: Diagnosis not present

## 2020-08-08 DIAGNOSIS — H0288B Meibomian gland dysfunction left eye, upper and lower eyelids: Secondary | ICD-10-CM | POA: Diagnosis not present

## 2020-08-14 ENCOUNTER — Telehealth: Payer: Self-pay | Admitting: Endocrinology

## 2020-08-14 ENCOUNTER — Ambulatory Visit: Payer: Medicare HMO | Admitting: Endocrinology

## 2020-08-14 ENCOUNTER — Other Ambulatory Visit: Payer: Self-pay

## 2020-08-14 VITALS — BP 104/60 | Ht 61.0 in | Wt 120.0 lb

## 2020-08-14 DIAGNOSIS — M810 Age-related osteoporosis without current pathological fracture: Secondary | ICD-10-CM | POA: Diagnosis not present

## 2020-08-14 DIAGNOSIS — E559 Vitamin D deficiency, unspecified: Secondary | ICD-10-CM

## 2020-08-14 MED ORDER — VITAMIN D 50 MCG (2000 UT) PO CAPS
1.0000 | ORAL_CAPSULE | Freq: Every day | ORAL | 2 refills | Status: DC
Start: 2020-08-14 — End: 2021-04-17

## 2020-08-14 NOTE — Telephone Encounter (Signed)
Patient's husband called to let Dr. Loanne Drilling know that patient takes Vitamin D3 50 mcg 2000 IU once daily Patient will have her labs done at [facility] where she will be having bone density done on 08/18/20

## 2020-08-14 NOTE — Telephone Encounter (Signed)
Medication list updated to reflect. Routing this message to Dr. Loanne Drilling as requested.

## 2020-08-14 NOTE — Progress Notes (Signed)
Subjective:    Patient ID: Danielle Gaines, female    DOB: 1941/05/10, 79 y.o.   MRN: [id number]  HPI Pt returns for f/u of osteoporosis: Dx'ed: 2004 Secondary cause: early menopause (age 67) Fractures: left face (2007), and coccyx (1999), both with falls.   Past rx: none Current rx: fosamax (since 2014), and Prolia (since 2019).  Last DEXA result: -3.1, at the [facility] (2020).  Other: she was seen in 2016 for hypercalcemia, and vit-D was found to be high; she also had urolithiasis in 2016; she declined to add QD injection in 2020.   Interval hx: she again does not know vitamin-D dosage, but husband believes it to be 1000 units/d.  Main symptom is fatigue.  Pt says she can give herself an injection if necessary.   Past Medical History:  Diagnosis Date  . Arthritis   . Benzodiazepine dependence ([place])   . Carotid arterial disease ([facility])    mild  . Chronic kidney disease (CKD), stage III (moderate) 08/01/2018   -seeing  Nephrology, Dr. Posey Pronto, thought from [place] per renal notes   . Chronic pain    managed by pain clinic  . Colon abnormality    didn't work right so part of it was removed at [facility]  . Depression    managed by Dr. Toy Care  . Diastolic heart failure ([place])    mild on echo 2016  . GERD (gastroesophageal reflux disease)   . IBS (irritable bowel syndrome)    sees Dr. Earlean Shawl  . Macular degeneration    goes to [facility] for this  . Migraine    managed by neurologist  . Osteoporosis   . Vertigo     Past Surgical History:  Procedure Laterality Date  . ABDOMINAL ADHESION SURGERY    . ABDOMINAL HYSTERECTOMY    . APPENDECTOMY    . CHOLECYSTECTOMY    . COLON SURGERY    . CYSTOSCOPY W/ URETERAL STENT PLACEMENT Right 04/27/2016   Procedure: CYSTOSCOPY WITH RETROGRADE PYELOGRAM/ RIGHT URETERAL STENT PLACEMENT;  Surgeon: Ardis Hughs, MD;  Location: [facility];  Service: Urology;  Laterality: Right;  . OVARIAN CYST SURGERY    . SMALL INTESTINE SURGERY      Social  History   Socioeconomic History  . Marital status: Married    Spouse name: Not on file  . Number of children: Not on file  . Years of education: Not on file  . Highest education level: Not on file  Occupational History  . Not on file  Tobacco Use  . Smoking status: Never Smoker  . Smokeless tobacco: Never Used  Vaping Use  . Vaping Use: Never used  Substance and Sexual Activity  . Alcohol use: No  . Drug use: No  . Sexual activity: Not Currently    Birth control/protection: Post-menopausal  Other Topics Concern  . Not on file  Social History Narrative   Work or School: none      Home Situation: lives with husband      Spiritual Beliefs:       Lifestyle: no regular exercise; diet is ok         Social Determinants of Radio broadcast assistant Strain:   . Difficulty of Paying Living Expenses: Not on file  Food Insecurity:   . Worried About Charity fundraiser in the Last Year: Not on file  . Ran Out of Food in the Last Year: Not on file  Transportation Needs:   . Lack  of Transportation (Medical): Not on file  . Lack of Transportation (Non-Medical): Not on file  Physical Activity:   . Days of Exercise per Week: Not on file  . Minutes of Exercise per Session: Not on file  Stress:   . Feeling of Stress : Not on file  Social Connections:   . Frequency of Communication with Friends and Family: Not on file  . Frequency of Social Gatherings with Friends and Family: Not on file  . Attends Religious Services: Not on file  . Active Member of Clubs or Organizations: Not on file  . Attends Archivist Meetings: Not on file  . Marital Status: Not on file  Intimate Partner Violence:   . Fear of Current or Ex-Partner: Not on file  . Emotionally Abused: Not on file  . Physically Abused: Not on file  . Sexually Abused: Not on file    Current Outpatient Medications on File Prior to Visit  Medication Sig Dispense Refill  . acetaminophen (TYLENOL) 500 MG tablet  Take 1,000 mg by mouth daily as needed for headache.    . alendronate (FOSAMAX) 70 MG tablet TAKE 1 TABLET(70 MG) BY MOUTH 1 TIME A WEEK WITH A FULL GLASS OF WATER AND ON AN EMPTY STOMACH 12 tablet 2  . betamethasone valerate ointment (VALISONE) 0.1 % Apply a pea sized amount 2 x a week as needed 30 g 0  . Biotin 1000 MCG tablet Take 1,000 mcg by mouth daily.    . Calcium Carbonate (CALTRATE 600 PO) Take by mouth daily.    . citalopram (CELEXA) 20 MG tablet TK 1 T PO D  2  . cycloSPORINE (RESTASIS) 0.05 % ophthalmic emulsion Place 1 drop into both eyes 2 (two) times daily.     Marland Kitchen denosumab (PROLIA) 60 MG/ML SOSY injection Inject 60 mg into the skin every 6 (six) months.    . dorzolamide-timolol (COSOPT) 22.3-6.8 MG/ML ophthalmic solution Place 1 drop into both eyes 2 times daily.    . Estradiol 10 MCG TABS vaginal tablet One tablet vaginally daily. 24 tablet 1  . folic acid (FOLVITE) 1 MG tablet Take by mouth.    . furosemide (LASIX) 40 MG tablet Take 1 tablet (40 mg total) by mouth daily. (from nephrology) 30 tablet 3  . Galcanezumab-gnlm (EMGALITY) 120 MG/ML SOAJ Inject into the skin.    Marland Kitchen ipratropium (ATROVENT) 0.03 % nasal spray Place 2 sprays into both nostrils 3 (three) times daily. 30 mL 0  . lidocaine (XYLOCAINE) 5 % ointment Apply a pea sized amount topically 20-30 minutes prior to using the estrogen tablet. 30 g 0  . LINZESS 290 MCG CAPS capsule Take 290 mcg by mouth daily before breakfast.   0  . LORazepam (ATIVAN) 0.5 MG tablet Take 0.5 mg by mouth 3 (three) times daily as needed for anxiety.    Marland Kitchen olopatadine (PATANOL) 0.1 % ophthalmic solution 1 drop 2 (two) times daily.    Marland Kitchen omeprazole (PRILOSEC) 40 MG capsule Take 80 mg by mouth daily.   0  . Probiotic Product (ALIGN PO) Take by mouth daily.    . sodium bicarbonate 650 MG tablet TK 1 T PO BID  9  . SUMAtriptan (IMITREX) 100 MG tablet Take 100 mg by mouth every 2 (two) hours as needed for migraine.     Marland Kitchen terconazole (TERAZOL 3) 80  MG vaginal suppository Place 1 suppository (80 mg total) vaginally at bedtime. 3 suppository 0  . topiramate (TOPAMAX) 100 MG tablet Take  3 tablets at bedtime    . tretinoin (RETIN-A) 0.025 % cream APPLY A PEA SIZED AMOUNT TO FACE ONCE DAILY IN THE EVENING    . UBRELVY 100 MG TABS     . ZENPEP 15000 units CPEP Take 45,000 mg by mouth 2 (two) times daily.   0   No current facility-administered medications on file prior to visit.    Allergies  Allergen Reactions  . Sulfa Antibiotics Swelling and Other (See Comments)    Reaction:  Unspecified swelling reaction     Family History  Problem Relation Age of Onset  . Kidney disease Mother   . Heart disease Mother   . Osteoporosis Mother   . Heart Problems Sister   . Cancer Maternal Aunt        intestinal and liver cancer  . Stroke Maternal Grandmother   . Heart disease Maternal Grandmother   . Stroke Other   . Depression Other        everyone  . Hypercalcemia Neg Hx   . Breast cancer Neg Hx     BP 104/60   Ht 5\' 1"  (1.549 m)   Wt 120 lb (54.4 kg)   BMI 22.67 kg/m    Review of Systems Denies falls.      Objective:   Physical Exam VITAL SIGNS:  See vs page.   GENERAL: no distress.  GAIT: normal and steady.        Assessment & Plan:  Osteoporosis, due for recheck.  We discussed.  She says she'll take Forteo or Tymlos if this DEXA is not improved.  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  [place], [place] [address]. If the bone density is worse, I'll prescribe for you a once a day injection.  Blood tests are requested for you today.  We'll let you know about the results.   Please come back for a follow-up appointment in 1 year.

## 2020-08-14 NOTE — Patient Instructions (Addendum)
Please call [phone] to schedule your bone density appointment.  [place], [place] [address]. If the bone density is worse, I'll prescribe for you a once a day injection.  Blood tests are requested for you today.  We'll let you know about the results.   Please come back for a follow-up appointment in 1 year.

## 2020-08-15 ENCOUNTER — Inpatient Hospital Stay: Admission: RE | Admit: 2020-08-15 | Payer: Medicare HMO | Source: Ambulatory Visit

## 2020-08-15 DIAGNOSIS — H348332 Tributary (branch) retinal vein occlusion, bilateral, stable: Secondary | ICD-10-CM | POA: Diagnosis not present

## 2020-08-15 DIAGNOSIS — H35371 Puckering of macula, right eye: Secondary | ICD-10-CM | POA: Diagnosis not present

## 2020-08-15 DIAGNOSIS — Z961 Presence of intraocular lens: Secondary | ICD-10-CM | POA: Diagnosis not present

## 2020-08-15 DIAGNOSIS — Z9889 Other specified postprocedural states: Secondary | ICD-10-CM | POA: Diagnosis not present

## 2020-08-15 DIAGNOSIS — H18529 Epithelial (juvenile) corneal dystrophy, unspecified eye: Secondary | ICD-10-CM | POA: Diagnosis not present

## 2020-08-15 DIAGNOSIS — H3581 Retinal edema: Secondary | ICD-10-CM | POA: Diagnosis not present

## 2020-08-15 DIAGNOSIS — H35342 Macular cyst, hole, or pseudohole, left eye: Secondary | ICD-10-CM | POA: Diagnosis not present

## 2020-08-15 DIAGNOSIS — H30033 Focal chorioretinal inflammation, peripheral, bilateral: Secondary | ICD-10-CM | POA: Diagnosis not present

## 2020-08-18 ENCOUNTER — Other Ambulatory Visit (INDEPENDENT_AMBULATORY_CARE_PROVIDER_SITE_OTHER): Payer: Medicare HMO

## 2020-08-18 ENCOUNTER — Other Ambulatory Visit: Payer: Self-pay

## 2020-08-18 ENCOUNTER — Ambulatory Visit (INDEPENDENT_AMBULATORY_CARE_PROVIDER_SITE_OTHER)
Admission: RE | Admit: 2020-08-18 | Discharge: 2020-08-18 | Disposition: A | Discharge status: Home / Self Care | Payer: Medicare HMO | Source: Ambulatory Visit | Attending: Endocrinology | Admitting: Endocrinology

## 2020-08-18 DIAGNOSIS — M810 Age-related osteoporosis without current pathological fracture: Secondary | ICD-10-CM | POA: Diagnosis not present

## 2020-08-18 LAB — BASIC METABOLIC PANEL
BUN: 35 mg/dL — ABNORMAL HIGH (ref 6–23)
CO2: 17 mEq/L — ABNORMAL LOW (ref 19–32)
Calcium: 9.4 mg/dL (ref 8.4–10.5)
Chloride: 109 mEq/L (ref 96–112)
Creatinine, Ser: 1.54 mg/dL — ABNORMAL HIGH (ref 0.40–1.20)
GFR: 32.66 mL/min — ABNORMAL LOW (ref >60.00)
Glucose, Bld: 98 mg/dL (ref 70–99)
Potassium: 3.3 mEq/L — ABNORMAL LOW (ref 3.5–5.1)
Sodium: 136 mEq/L (ref 135–145)

## 2020-08-18 LAB — VITAMIN D 25 HYDROXY (VIT D DEFICIENCY, FRACTURES): VITD: 79.06 ng/mL (ref 30.00–100.00)

## 2020-08-20 LAB — PTH, INTACT AND CALCIUM
Calcium: 9.2 mg/dL (ref 8.6–10.4)
PTH: 27 pg/mL (ref 14–64)

## 2020-08-21 ENCOUNTER — Telehealth: Payer: Self-pay

## 2020-08-21 NOTE — Telephone Encounter (Signed)
Danielle Gaines, spouse, [phone] request return call states pt saw kidney doctor and everything was fine. Pt and spouse confused about lab results given to pt today.

## 2020-08-21 NOTE — Telephone Encounter (Signed)
-----   Message from Renato Shin, MD sent at 08/20/2020  6:05 PM EDT ----- please contact patient: The kidneys are more off.  Please see Dr Ethlyn Gallery, to follow this up. I'll let you know when we get the bone density results.

## 2020-08-21 NOTE — Telephone Encounter (Signed)
Informed patient of lab results and recommendations.

## 2020-08-22 ENCOUNTER — Telehealth: Payer: Self-pay

## 2020-08-22 NOTE — Telephone Encounter (Signed)
i'm glad the kidney dr felt good about things.  However, he/she did not mean to say that the kidneys are functioning normally.

## 2020-08-22 NOTE — Telephone Encounter (Signed)
Spoke with patient and questions answered. 

## 2020-08-22 NOTE — Telephone Encounter (Signed)
Left a vm for patient to callback . Looks like patient was already advise on lab results. Please find out which question patient may have

## 2020-08-22 NOTE — Telephone Encounter (Signed)
New message    Calling for test results  

## 2020-08-22 NOTE — Telephone Encounter (Signed)
Patient states would like to be called back at [phone] - they were wanting to know about the Bone Density, and also had additional questions regarding labs and asked about her kidneys.

## 2020-08-22 NOTE — Telephone Encounter (Signed)
Spoke with patient and informed her labs have changed and she should follow up with nephrology.  Patient is confused because she was told she was fine.  Advised patient to contact nephrology and advise them of GFR results and to let them manage from there.  Patient understands and agreeable.

## 2020-08-25 ENCOUNTER — Other Ambulatory Visit: Payer: Self-pay | Admitting: Endocrinology

## 2020-08-25 ENCOUNTER — Telehealth: Payer: Self-pay

## 2020-08-25 MED ORDER — TERIPARATIDE 620 MCG/2.48ML ~~LOC~~ SOPN: 20.0000 ug | PEN_INJECTOR | Freq: Every day | SUBCUTANEOUS | 11 refills | Status: DC

## 2020-08-25 NOTE — Telephone Encounter (Signed)
Danielle Gaines pt:  Pt spouse Danielle Gaines requests copies of bone density results & blood work results to take to kidney doctor.

## 2020-08-26 ENCOUNTER — Telehealth: Payer: Self-pay

## 2020-08-26 NOTE — Telephone Encounter (Signed)
-----   Message from Renato Shin, MD sent at 08/25/2020 10:10 AM EDT ----- please contact patient: The bone density is still low.  I have sent a prescription to your pharmacy, for the once a day injection, as we discussed.  Please call if you need help with this.

## 2020-08-26 NOTE — Telephone Encounter (Signed)
Informed patient of results and recommendations. 

## 2020-08-28 ENCOUNTER — Other Ambulatory Visit: Payer: Self-pay | Admitting: Obstetrics and Gynecology

## 2020-08-28 ENCOUNTER — Telehealth: Payer: Self-pay

## 2020-08-28 ENCOUNTER — Other Ambulatory Visit: Payer: Self-pay

## 2020-08-28 DIAGNOSIS — N952 Postmenopausal atrophic vaginitis: Secondary | ICD-10-CM

## 2020-08-28 NOTE — Telephone Encounter (Signed)
Spoke with patient. Patient states she does not need a refill of estradiol 10 mcg vaginal tab at this time. Plans to further discuss and request request refills at 09/04/20 AEX.   MMG scheduled for 09/11/20.   Patient thankful for call.   No Rx sent at this time.   Encounter closed.

## 2020-08-28 NOTE — Telephone Encounter (Signed)
Forteo prior authorization submitted via covermymeds.  Waiting response.  Key: [id number] - PA Case ID: [id number] Need help? Call us at [phone] Status Sent to [place] 620MCG/2.48ML pen-injectors Form [employer] Electronic PA Form

## 2020-08-28 NOTE — Telephone Encounter (Signed)
New message    Need prior authorization Teriparatide, Recombinant, (FORTEO) 620 MCG/2.48ML SOPN  [facility] on [address] / [employer].

## 2020-08-28 NOTE — Telephone Encounter (Signed)
Will process prior auth when received.

## 2020-09-03 ENCOUNTER — Other Ambulatory Visit: Payer: Self-pay

## 2020-09-03 ENCOUNTER — Encounter: Payer: Self-pay | Admitting: Family Medicine

## 2020-09-03 ENCOUNTER — Ambulatory Visit (INDEPENDENT_AMBULATORY_CARE_PROVIDER_SITE_OTHER): Payer: Medicare HMO | Admitting: Family Medicine

## 2020-09-03 ENCOUNTER — Other Ambulatory Visit: Payer: Self-pay | Admitting: Family Medicine

## 2020-09-03 VITALS — BP 120/78 | HR 69 | Temp 98.0°F | Ht 60.0 in | Wt 120.1 lb

## 2020-09-03 DIAGNOSIS — M810 Age-related osteoporosis without current pathological fracture: Secondary | ICD-10-CM

## 2020-09-03 DIAGNOSIS — N183 Chronic kidney disease, stage 3 unspecified: Secondary | ICD-10-CM

## 2020-09-03 DIAGNOSIS — N1831 Chronic kidney disease, stage 3a: Secondary | ICD-10-CM | POA: Diagnosis not present

## 2020-09-03 DIAGNOSIS — D631 Anemia in chronic kidney disease: Secondary | ICD-10-CM

## 2020-09-03 DIAGNOSIS — E678 Other specified hyperalimentation: Secondary | ICD-10-CM

## 2020-09-03 DIAGNOSIS — H3530 Unspecified macular degeneration: Secondary | ICD-10-CM

## 2020-09-03 DIAGNOSIS — M2550 Pain in unspecified joint: Secondary | ICD-10-CM | POA: Diagnosis not present

## 2020-09-03 DIAGNOSIS — F419 Anxiety disorder, unspecified: Secondary | ICD-10-CM | POA: Diagnosis not present

## 2020-09-03 DIAGNOSIS — Z1159 Encounter for screening for other viral diseases: Secondary | ICD-10-CM

## 2020-09-03 DIAGNOSIS — N189 Chronic kidney disease, unspecified: Secondary | ICD-10-CM | POA: Diagnosis not present

## 2020-09-03 DIAGNOSIS — R5383 Other fatigue: Secondary | ICD-10-CM

## 2020-09-03 DIAGNOSIS — F32A Depression, unspecified: Secondary | ICD-10-CM | POA: Diagnosis not present

## 2020-09-03 DIAGNOSIS — Z23 Encounter for immunization: Secondary | ICD-10-CM

## 2020-09-03 NOTE — Progress Notes (Signed)
Danielle Gaines DOB: February 19, 1941 Encounter date: 09/03/2020  This is a 79 y.o. female who presents for complete physical but due to other medical concerns we will hold off and address current concerns  History of present illness/Additional concerns: Last visit we discussed hypotension, dizziness, sinus symptoms, fatigue.  States that she has been having a hard time. Saw kidney doctor and got 'glowing report'. Stable stage III ckd. Bloodwork from Dr. Loanne Drilling didn't match up with nephrology, so she has to get bloodwork repeated today. Feeling weak. Just doesn't feel well; physically can't just get up and walk around. Having new spots/skin changes that she wonders about.   Has new migraine doctor - Dr. Ninfa Meeker. Tried infusion of the vyepti and although she tolerated this, but it didn't help. They did suggest trying another shot before stopping medication. She would be due again mid November. Has been having daily migraines for a long time. Migraines for life, but worse as she ages. Roselyn Meier does help her; imitrex no longer helps her.   *Following with endocrinology for osteoporosis.  Plan is to repeat bone density to determine if change in medication is needed.  Currently using Prolia and Fosamax.  Appetite is ok.  Still having muscle and joint aching. Was getting injections from rheumatology; had to cancel follow up there.   *Follows with Dr.Jertson for gynecology needs.  Seen in May for brown vaginal discharge and itching.  Atrophy was suspected with plans to increase estrogen, try lidocaine ointment prior to vaginal insertion, avoidance of pads, steroid ointment twice a week. States that she has been getting this treatment for months but doesn't feel like it is helping. She has follow up tomorrow.   Does get chills a lot.   *She was also seen by cardiology in March 2021.  Stress echo was ordered and demonstrated ejection fraction of 60 to 65% with normal function of the left ventricle and no  regional wall abnormalities.  Negative for ischemia.  Doesn't feel like she is getting good deep sleep. Moving wakes her up because of hips and turning hurts her. Hips hurt, shoulders hurts, hands and feet bother her. Sometimes feels like someone is sticking pins in feet; painful.   Past Medical History:  Diagnosis Date  . Arthritis   . 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)   . Carotid arterial disease ([facility])    mild  . Chronic kidney disease (CKD), stage III (moderate) ([place]) 08/01/2018   -seeing  Nephrology, Dr. Posey Pronto, thought from [place] per renal notes   . Chronic pain    managed by pain clinic  . Colon abnormality    didn't work right so part of it was removed at [facility]  . Depression    managed by Dr. Toy Care  . Diastolic heart failure ([place])    mild on echo 2016  . GERD (gastroesophageal reflux disease)   . IBS (irritable bowel syndrome)    sees Dr. Earlean Shawl  . Macular degeneration    goes to [facility] for this  . Migraine    managed by neurologist  . Osteoporosis   . Vertigo    Past Surgical History:  Procedure Laterality Date  . ABDOMINAL ADHESION SURGERY    . ABDOMINAL HYSTERECTOMY    . APPENDECTOMY    . CHOLECYSTECTOMY    . COLON SURGERY    . CYSTOSCOPY W/ URETERAL STENT PLACEMENT Right 04/27/2016   Procedure: CYSTOSCOPY WITH RETROGRADE PYELOGRAM/ RIGHT URETERAL STENT PLACEMENT;  Surgeon: Ardis Hughs, MD;  Location: [facility];  Service:  Urology;  Laterality: Right;  . OVARIAN CYST SURGERY    . SMALL INTESTINE SURGERY     Allergies  Allergen Reactions  . Sulfa Antibiotics Swelling and Other (See Comments)    Reaction:  Unspecified swelling reaction    Current Meds  Medication Sig  . acetaminophen (TYLENOL) 500 MG tablet Take 1,000 mg by mouth daily as needed for headache.  . alendronate (FOSAMAX) 70 MG tablet TAKE 1 TABLET(70 MG) BY MOUTH 1 TIME A WEEK WITH A FULL GLASS OF WATER AND ON AN EMPTY STOMACH  . Biotin 1000 MCG tablet Take 1,000 mcg by mouth daily.  .  Calcium Carbonate (CALTRATE 600 PO) Take by mouth daily.  . Cholecalciferol (VITAMIN D) 50 MCG (2000 UT) CAPS Take 1 capsule (2,000 Units total) by mouth daily.  . citalopram (CELEXA) 20 MG tablet TK 1 T PO D  . cycloSPORINE (RESTASIS) 0.05 % ophthalmic emulsion Place 1 drop into both eyes 2 (two) times daily.   Marland Kitchen denosumab (PROLIA) 60 MG/ML SOSY injection Inject 60 mg into the skin every 6 (six) months.  . dorzolamide-timolol (COSOPT) 22.3-6.8 MG/ML ophthalmic solution Place 1 drop into both eyes 2 times daily.  . Estradiol 10 MCG TABS vaginal tablet One tablet vaginally daily.  . folic acid (FOLVITE) 1 MG tablet Take by mouth.  . furosemide (LASIX) 40 MG tablet Take 1 tablet (40 mg total) by mouth daily. (from nephrology)  . Galcanezumab-gnlm (EMGALITY) 120 MG/ML SOAJ Inject into the skin.  Marland Kitchen ipratropium (ATROVENT) 0.03 % nasal spray Place 2 sprays into both nostrils 3 (three) times daily.  Marland Kitchen lidocaine (XYLOCAINE) 5 % ointment Apply a pea sized amount topically 20-30 minutes prior to using the estrogen tablet.  Marland Kitchen LINZESS 290 MCG CAPS capsule Take 290 mcg by mouth daily before breakfast.   . LORazepam (ATIVAN) 0.5 MG tablet Take 0.5 mg by mouth 3 (three) times daily as needed for anxiety.  Marland Kitchen olopatadine (PATANOL) 0.1 % ophthalmic solution 1 drop 2 (two) times daily.  Marland Kitchen omeprazole (PRILOSEC) 40 MG capsule Take 80 mg by mouth daily.   . Probiotic Product (ALIGN PO) Take by mouth daily.  . sodium bicarbonate 650 MG tablet TK 1 T PO BID  . SUMAtriptan (IMITREX) 100 MG tablet Take 100 mg by mouth every 2 (two) hours as needed for migraine.   Marland Kitchen terconazole (TERAZOL 3) 80 MG vaginal suppository Place 1 suppository (80 mg total) vaginally at bedtime.  . topiramate (TOPAMAX) 100 MG tablet Take 3 tablets at bedtime  . tretinoin (RETIN-A) 0.025 % cream APPLY A PEA SIZED AMOUNT TO FACE ONCE DAILY IN THE EVENING  . UBRELVY 100 MG TABS   . ZENPEP 15000 units CPEP Take 45,000 mg by mouth 2 (two) times  daily.   . [DISCONTINUED] betamethasone valerate ointment (VALISONE) 0.1 % Apply a pea sized amount 2 x a week as needed  . [DISCONTINUED] Teriparatide, Recombinant, (FORTEO) 620 MCG/2.48ML SOPN Inject 20 mcg into the skin daily.   Social History   Tobacco Use  . Smoking status: Never Smoker  . Smokeless tobacco: Never Used  Substance Use Topics  . Alcohol use: No   Family History  Problem Relation Age of Onset  . Kidney disease Mother   . Heart disease Mother   . Osteoporosis Mother   . Heart Problems Sister        pacemaker  . Cancer Maternal Aunt        intestinal and liver cancer  . Stroke Maternal Grandmother   .  Heart disease Maternal Grandmother   . Stroke Other   . Depression Other        everyone  . Hypercalcemia Neg Hx   . Breast cancer Neg Hx      Review of Systems  Constitutional: Positive for fatigue. Negative for chills and fever.  Respiratory: Negative for cough, chest tightness, shortness of breath and wheezing.   Cardiovascular: Negative for chest pain, palpitations and leg swelling.  Gastrointestinal: Negative for constipation and diarrhea.  Genitourinary: Positive for vaginal bleeding. Negative for difficulty urinating.  Musculoskeletal: Positive for arthralgias, myalgias and neck pain.  Neurological: Positive for weakness, light-headedness and headaches.    CBC:  Lab Results  Component Value Date   WBC 7.7 09/03/2020   WBC 7.0 12/21/2018   HGB 14.5 09/03/2020   HGB 13.9 09/17/2016   HCT 42.8 09/03/2020   HCT 42.2 09/17/2016   MCH 34.0 (H) 09/03/2020   MCH 32.8 06/29/2018   MCHC 33.9 09/03/2020   MCHC 33.3 12/21/2018   RDW 12.3 09/03/2020   RDW 13.4 09/17/2016   PLT 226 09/03/2020   CMP: Lab Results  Component Value Date   NA 138 09/03/2020   NA 136 05/20/2016   K 3.7 09/03/2020   K 3.5 05/20/2016   CL 110 (H) 09/03/2020   CO2 12 (L) 09/03/2020   CO2 13 (L) 05/20/2016   ANIONGAP 6 06/29/2018   GLUCOSE 103 (H) 09/03/2020    GLUCOSE 98 08/18/2020   GLUCOSE 104 05/20/2016   BUN 35 (H) 09/03/2020   BUN 19.9 05/20/2016   CREATININE 1.54 (H) 09/03/2020   CREATININE 1.5 (H) 05/20/2016   LABGLOB 3.2 09/03/2020   GFRAA 37 (L) 09/03/2020   CALCIUM 9.4 09/03/2020   CALCIUM 9.8 05/20/2016   PROT 8.1 09/03/2020   PROT 8.2 05/20/2016   AGRATIO 1.5 09/03/2020   BILITOT <0.2 09/03/2020   BILITOT 0.38 05/20/2016   ALKPHOS 57 09/03/2020   ALKPHOS 69 05/20/2016   ALT 14 09/03/2020   ALT 37 05/20/2016   AST 16 09/03/2020   AST 20 05/20/2016   LIPID: Lab Results  Component Value Date   CHOL 173 08/18/2017   TRIG 106.0 08/18/2017   HDL 79.10 08/18/2017   LDLCALC 73 08/18/2017    Objective:  BP 120/78 (BP Location: Left Arm, Patient Position: Sitting, Cuff Size: Normal)   Pulse 69   Temp 98 F (36.7 C) (Oral)   Ht 5' (1.524 m)   Wt 120 lb 1.6 oz (54.5 kg)   SpO2 97%   BMI 23.46 kg/m   Weight: 120 lb 1.6 oz (54.5 kg)   BP Readings from Last 3 Encounters:  09/04/20 122/68  09/03/20 120/78  08/14/20 104/60   Wt Readings from Last 3 Encounters:  09/04/20 117 lb (53.1 kg)  09/03/20 120 lb 1.6 oz (54.5 kg)  08/14/20 120 lb (54.4 kg)    Physical Exam Constitutional:      General: She is not in acute distress.    Appearance: She is well-developed.  HENT:     Head: Normocephalic and atraumatic.     Right Ear: External ear normal.     Left Ear: External ear normal.     Mouth/Throat:     Pharynx: No oropharyngeal exudate.  Eyes:     Conjunctiva/sclera: Conjunctivae normal.     Pupils: Pupils are equal, round, and reactive to light.  Neck:     Thyroid: No thyromegaly.  Cardiovascular:     Rate and Rhythm: Normal rate and regular  rhythm.     Heart sounds: Normal heart sounds. No murmur heard.  No friction rub. No gallop.   Pulmonary:     Effort: Pulmonary effort is normal.     Breath sounds: Normal breath sounds.  Abdominal:     General: Bowel sounds are normal. There is no distension.      Palpations: Abdomen is soft. There is no mass.     Tenderness: There is no abdominal tenderness. There is no guarding.     Hernia: No hernia is present.  Musculoskeletal:        General: No tenderness or deformity. Normal range of motion.     Cervical back: Normal range of motion and neck supple.     Comments: She does have some edema of her PIP joints bilateral hands as well as tenderness to palpation.  No other edema of joints noted.  She is tender with palpation over other joints.  She is not tender over palpation to muscles or, for example, fibromyalgia trigger points.  Lymphadenopathy:     Cervical: No cervical adenopathy.  Skin:    General: Skin is warm and dry.     Findings: No rash.  Neurological:     Mental Status: She is alert and oriented to person, place, and time.     Deep Tendon Reflexes: Reflexes normal.     Reflex Scores:      Tricep reflexes are 2+ on the right side and 2+ on the left side.      Bicep reflexes are 2+ on the right side and 2+ on the left side.      Brachioradialis reflexes are 2+ on the right side and 2+ on the left side.      Patellar reflexes are 2+ on the right side and 2+ on the left side. Psychiatric:        Speech: Speech normal.        Behavior: Behavior normal.        Thought Content: Thought content normal.     Assessment/Plan: Health Maintenance Due  Topic Date Due  . URINE MICROALBUMIN  Never done   Health Maintenance reviewed.  1. Fatigue, unspecified type Looking back in notes, her fatigue has been something onset ongoing for many years.  Her husband and her were under the impression that once bone density is treated, she will begin to feel better.  They were hopeful that injections for bone density treatment were going to help with energy and decreasing her pain level.  I am not sure we have a good understanding of what is causing her fatigue.  I think it is reasonable for [place] to repeat some basic blood work.  She does have all over  joint tenderness, and has significant morning stiffness which causes her to take a couple of hours to get going in the morning.  Prior sed rate evaluation was negative, but I am going to add some further autoimmune evaluation.  We certainly could consider rheumatology or potentially evaluation with integrative medicine for further evaluation. - CBC with Differential/Platelet; Future - Comprehensive metabolic panel; Future - TSH; Future  2. Arthralgia, unspecified joint See above. - Uric acid; Future - ANA; Future - C-reactive protein; Future - Rheumatoid factor; Future - Sedimentation rate; Future  3. Macular degeneration, unspecified laterality, unspecified type She does follow regularly with eye specialist.  4. Anxiety and depression - managed at [facility] She expresses her current physical state does contribute to mood feeling down.  We will continue  to monitor and follow-up with her pending blood work evaluation.  5. Anemia of chronic renal failure, unspecified CKD stage She is following regularly with nephrology.  She does have lab work today that has been ordered by them which she plans to complete at offsite [facility] facility.  My lab work has been printed for her to complete along with this. - Iron, TIBC and Ferritin Panel; Future  6. Stage 3 chronic kidney disease, unspecified whether stage 3a or 3b CKD ([place]) See above.  7. Age-related osteoporosis without current pathological fracture She is following regularly with endocrinology for management of this.  We discussed that treatment for bone density is likely not going to help her with overall fatigue and multijoint pain.  8. Excessive vitamin B12 intake - Vitamin B12; Future  9. Encounter for hepatitis C screening test for low risk patient - Hepatitis C antibody; Future  10. Need for immunization against influenza - Flu Vaccine QUAD High Dose(Fluad)  Return in about 3 months (around 12/04/2020) for physical  exam.  Micheline Rough, MD  45 minutes spent in total time with chart review, time with patient and husband ongoing symptoms plans for evaluation, exam, and charting.

## 2020-09-04 ENCOUNTER — Ambulatory Visit: Payer: Medicare HMO

## 2020-09-04 ENCOUNTER — Ambulatory Visit (INDEPENDENT_AMBULATORY_CARE_PROVIDER_SITE_OTHER): Payer: Medicare HMO | Admitting: Obstetrics and Gynecology

## 2020-09-04 ENCOUNTER — Encounter: Payer: Self-pay | Admitting: Obstetrics and Gynecology

## 2020-09-04 ENCOUNTER — Other Ambulatory Visit: Payer: Self-pay

## 2020-09-04 VITALS — BP 122/68 | HR 85 | Ht 60.0 in | Wt 117.0 lb

## 2020-09-04 DIAGNOSIS — Z01419 Encounter for gynecological examination (general) (routine) without abnormal findings: Secondary | ICD-10-CM

## 2020-09-04 DIAGNOSIS — N952 Postmenopausal atrophic vaginitis: Secondary | ICD-10-CM

## 2020-09-04 DIAGNOSIS — N763 Subacute and chronic vulvitis: Secondary | ICD-10-CM

## 2020-09-04 LAB — COMPREHENSIVE METABOLIC PANEL
ALT: 14 IU/L (ref 0–32)
AST: 16 IU/L (ref 0–40)
Albumin/Globulin Ratio: 1.5 (ref 1.2–2.2)
Albumin: 4.9 g/dL — ABNORMAL HIGH (ref 3.7–4.7)
Alkaline Phosphatase: 57 IU/L (ref 44–121)
BUN/Creatinine Ratio: 23 (ref 12–28)
BUN: 35 mg/dL — ABNORMAL HIGH (ref 8–27)
Bilirubin Total: <0.2 mg/dL (ref 0.0–1.2)
CO2: 12 mmol/L — ABNORMAL LOW (ref 20–29)
Calcium: 9.4 mg/dL (ref 8.7–10.3)
Chloride: 110 mmol/L — ABNORMAL HIGH (ref 96–106)
Creatinine, Ser: 1.54 mg/dL — ABNORMAL HIGH (ref 0.57–1.00)
GFR calc Af Amer: 37 mL/min/{1.73_m2} — ABNORMAL LOW (ref >59)
GFR calc non Af Amer: 32 mL/min/{1.73_m2} — ABNORMAL LOW (ref >59)
Globulin, Total: 3.2 g/dL (ref 1.5–4.5)
Glucose: 103 mg/dL — ABNORMAL HIGH (ref 65–99)
Potassium: 3.7 mmol/L (ref 3.5–5.2)
Sodium: 138 mmol/L (ref 134–144)
Total Protein: 8.1 g/dL (ref 6.0–8.5)

## 2020-09-04 LAB — URIC ACID: Uric Acid: 3.9 mg/dL (ref 3.1–7.9)

## 2020-09-04 LAB — CBC WITH DIFFERENTIAL/PLATELET
Basophils Absolute: 0 10*3/uL (ref 0.0–0.2)
Basos: 0 %
EOS (ABSOLUTE): 0 10*3/uL (ref 0.0–0.4)
Eos: 0 %
Hematocrit: 42.8 % (ref 34.0–46.6)
Hemoglobin: 14.5 g/dL (ref 11.1–15.9)
Immature Grans (Abs): 0 10*3/uL (ref 0.0–0.1)
Immature Granulocytes: 0 %
Lymphocytes Absolute: 2.3 10*3/uL (ref 0.7–3.1)
Lymphs: 30 %
MCH: 34 pg — ABNORMAL HIGH (ref 26.6–33.0)
MCHC: 33.9 g/dL (ref 31.5–35.7)
MCV: 101 fL — ABNORMAL HIGH (ref 79–97)
Monocytes Absolute: 0.4 10*3/uL (ref 0.1–0.9)
Monocytes: 5 %
Neutrophils Absolute: 4.9 10*3/uL (ref 1.4–7.0)
Neutrophils: 65 %
Platelets: 226 10*3/uL (ref 150–450)
RBC: 4.26 x10E6/uL (ref 3.77–5.28)
RDW: 12.3 % (ref 11.7–15.4)
WBC: 7.7 10*3/uL (ref 3.4–10.8)

## 2020-09-04 LAB — SEDIMENTATION RATE: Sed Rate: 13 mm/hr (ref 0–40)

## 2020-09-04 LAB — RHEUMATOID FACTOR: Rheumatoid fact SerPl-aCnc: <10 IU/mL (ref 0.0–13.9)

## 2020-09-04 LAB — FERRITIN: Ferritin: 483 ng/mL — ABNORMAL HIGH (ref 15–150)

## 2020-09-04 LAB — IRON AND TIBC
Iron Saturation: 31 % (ref 15–55)
Iron: 79 ug/dL (ref 27–139)
Total Iron Binding Capacity: 258 ug/dL (ref 250–450)
UIBC: 179 ug/dL (ref 118–369)

## 2020-09-04 LAB — TSH: TSH: 2.35 u[IU]/mL (ref 0.450–4.500)

## 2020-09-04 LAB — HEPATITIS C ANTIBODY: Hep C Virus Ab: 0.1 s/co ratio (ref 0.0–0.9)

## 2020-09-04 LAB — ANA: ANA Titer 1: NEGATIVE

## 2020-09-04 LAB — C-REACTIVE PROTEIN: CRP: <1 mg/L (ref 0–10)

## 2020-09-04 LAB — VITAMIN B12: Vitamin B-12: 769 pg/mL (ref 232–1245)

## 2020-09-04 MED ORDER — FORTEO 620 MCG/2.48ML ~~LOC~~ SOPN: 20.0000 ug | PEN_INJECTOR | Freq: Every day | SUBCUTANEOUS | 11 refills | Status: DC

## 2020-09-04 MED ORDER — ESTRADIOL 10 MCG VA TABS: ORAL_TABLET | VAGINAL | 3 refills | Status: DC

## 2020-09-04 MED ORDER — BETAMETHASONE VALERATE 0.1 % EX OINT
TOPICAL_OINTMENT | CUTANEOUS | 0 refills | Status: DC
Start: 2020-09-04 — End: 2021-08-31

## 2020-09-04 NOTE — Patient Instructions (Signed)
Atrophic Vaginitis  Atrophic vaginitis is a condition in which the tissues that line the vagina become dry and thin. This condition is most common in women who have stopped having regular menstrual periods (are in menopause). This usually starts when a woman is 45-77 years old. That is the time when a woman's estrogen levels begin to drop (decrease). Estrogen is a female hormone. It helps to keep the tissues of the vagina moist. It stimulates the vagina to produce a clear fluid that lubricates the vagina for sexual intercourse. This fluid also protects the vagina from infection. Lack of estrogen can cause the lining of the vagina to get thinner and dryer. The vagina may also shrink in size. It may become less elastic. Atrophic vaginitis tends to get worse over time as a woman's estrogen level drops. What are the causes? This condition is caused by the normal drop in estrogen that happens around the time of menopause. What increases the risk? Certain conditions or situations may lower a woman's estrogen level, leading to a higher risk for atrophic vaginitis. You are more likely to develop this condition if:  You are taking medicines that block estrogen.  You have had your ovaries removed.  You are being treated for cancer with X-ray (radiation) or medicines (chemotherapy).  You have given birth or are breastfeeding.  You are older than age 50.  You smoke. What are the signs or symptoms? Symptoms of this condition include:  Pain, soreness, or bleeding during sexual intercourse (dyspareunia).  Vaginal burning, irritation, or itching.  Pain or bleeding when a speculum is used in a vaginal exam (pelvic exam).  Having burning pain when passing urine.  Vaginal discharge that is brown or yellow. In some cases, there are no symptoms. How is this diagnosed? This condition is diagnosed by taking a medical history and doing a physical exam. This will include a pelvic exam that checks the  vaginal tissues. Though rare, you may also have other tests, including:  A urine test.  A test that checks the acid balance in your vagina (acid balance test). How is this treated? Treatment for this condition depends on how severe your symptoms are. Treatment may include:  Using an over-the-counter vaginal lubricant before sex.  Using a long-acting vaginal moisturizer.  Using low-dose vaginal estrogen for moderate to severe symptoms that do not respond to other treatments. Options include creams, tablets, and inserts (vaginal rings). Before you use a vaginal estrogen, tell your health care provider if you have a history of: ? Breast cancer. ? Endometrial cancer. ? Blood clots. If you are not sexually active and your symptoms are very mild, you may not need treatment. Follow these instructions at home: Medicines  Take over-the-counter and prescription medicines only as told by your health care provider. Do not use herbal or alternative medicines unless your health care provider says that you can.  Use over-the-counter creams, lubricants, or moisturizers for dryness only as directed by your health care provider. General instructions  If your atrophic vaginitis is caused by menopause, discuss all of your menopause symptoms and treatment options with your health care provider.  Do not douche.  Do not use products that can make your vagina dry. These include: ? Scented feminine sprays. ? Scented tampons. ? Scented soaps.  Vaginal intercourse can help to improve blood flow and elasticity of vaginal tissue. If it hurts to have sex, try using a lubricant or moisturizer just before having intercourse. Contact a health care provider if:    Your discharge looks different than normal.  Your vagina has an unusual smell.  You have new symptoms.  Your symptoms do not improve with treatment.  Your symptoms get worse. Summary  Atrophic vaginitis is a condition in which the tissues that  line the vagina become dry and thin. It is most common in women who have stopped having regular menstrual periods (are in menopause).  Treatment options include using vaginal lubricants and low-dose vaginal estrogen.  Contact a health care provider if your vagina has an unusual smell, or if your symptoms get worse or do not improve after treatment. This information is not intended to replace advice given to you by your health care provider. Make sure you discuss any questions you have with your health care provider. Document Revised: 10/28/2017 Document Reviewed: 08/11/2017 Elsevier Patient Education  2020 [employer]  

## 2020-09-04 NOTE — Progress Notes (Signed)
79 y.o. G1P0001 Married White or Caucasian Not Hispanic or Latino female here for annual exam.  Patient states that she has been very weak. She says that she stays out of breath constantly.She is in stage 3 chronic kidney failure.  She is still having vaginal itching. She has used all of the Betamethosone ointment. Is still having the dark vaginal discharge.   She c/o severe vulvar itching intermittently, is out of her steroid ointment. Was using the ointment intermittently. Current itching is on her mons, going on for a couple of weeks. No itching on her vulva currently.   H/O vulvitis/dermatitis likely from chronic pad usage. H/O partial colectomy and fecal incontinence. Also has rare GSI.  GI has done a colonoscopy.     She has had a hysterectomy.   She has grade 3 kidney failure. Not on dialysis yet.   No LMP recorded. Patient has had a hysterectomy.          Sexually active: No.  The current method of family planning is post menopausal status.    Exercising: No.  The patient does not participate in regular exercise at present. Smoker:  no  Health Maintenance: Pap:  Years ago  History of abnormal Pap:  no MMG:  09/04/19 Density B Bi-rads 1 neg Goes next week BMD:   08/25/20 osteoporosis Colonoscopy: up to date per patient TDaP:  2016 Gardasil: NA   reports that she has never smoked. She has never used smokeless tobacco. She reports that she does not drink alcohol and does not use drugs.  Past Medical History:  Diagnosis Date   Arthritis    Benzodiazepine dependence ([place])    Carotid arterial disease (H[facility])    mild   Chronic kidney disease (CKD), stage III (moderate) ([place]) 08/01/2018   -seeing  Nephrology, Dr. Posey Pronto, thought from [place] per renal notes    Chronic pain    managed by pain clinic   Colon abnormality    didn't work right so part of it was removed at [facility]   Depression    managed by Dr. Toy Care   Diastolic heart failure [facility])    mild on echo 2016   GERD  (gastroesophageal reflux disease)    IBS (irritable bowel syndrome)    sees Dr. Earlean Shawl   Macular degeneration    goes to [facility] for this   Migraine    managed by neurologist   Osteoporosis    Vertigo     Past Surgical History:  Procedure Laterality Date   ABDOMINAL ADHESION SURGERY     ABDOMINAL HYSTERECTOMY     APPENDECTOMY     CHOLECYSTECTOMY     COLON SURGERY     CYSTOSCOPY W/ URETERAL STENT PLACEMENT Right 04/27/2016   Procedure: CYSTOSCOPY WITH RETROGRADE PYELOGRAM/ RIGHT URETERAL STENT PLACEMENT;  Surgeon: Ardis Hughs, MD;  Location: [facility];  Service: Urology;  Laterality: Right;   OVARIAN CYST SURGERY     SMALL INTESTINE SURGERY      Current Outpatient Medications  Medication Sig Dispense Refill   acetaminophen (TYLENOL) 500 MG tablet Take 1,000 mg by mouth daily as needed for headache.     alendronate (FOSAMAX) 70 MG tablet TAKE 1 TABLET(70 MG) BY MOUTH 1 TIME A WEEK WITH A FULL GLASS OF WATER AND ON AN EMPTY STOMACH 12 tablet 2   Biotin 1000 MCG tablet Take 1,000 mcg by mouth daily.     Calcium Carbonate (CALTRATE 600 PO) Take by mouth daily.  Cholecalciferol (VITAMIN D) 50 MCG (2000 UT) CAPS Take 1 capsule (2,000 Units total) by mouth daily. 30 capsule 2   citalopram (CELEXA) 20 MG tablet TK 1 T PO D  2   cycloSPORINE (RESTASIS) 0.05 % ophthalmic emulsion Place 1 drop into both eyes 2 (two) times daily.      denosumab (PROLIA) 60 MG/ML SOSY injection Inject 60 mg into the skin every 6 (six) months.     dorzolamide-timolol (COSOPT) 22.3-6.8 MG/ML ophthalmic solution Place 1 drop into both eyes 2 times daily.     Estradiol 10 MCG TABS vaginal tablet One tablet vaginally daily. 24 tablet 1   folic acid (FOLVITE) 1 MG tablet Take by mouth.     FORTEO 620 MCG/2.48ML SOPN Inject 20 mcg into the skin daily. 2.24 mL 11   furosemide (LASIX) 40 MG tablet Take 1 tablet (40 mg total) by mouth daily. (from nephrology) 30 tablet 3    Galcanezumab-gnlm (EMGALITY) 120 MG/ML SOAJ Inject into the skin.     ipratropium (ATROVENT) 0.03 % nasal spray Place 2 sprays into both nostrils 3 (three) times daily. 30 mL 0   lidocaine (XYLOCAINE) 5 % ointment Apply a pea sized amount topically 20-30 minutes prior to using the estrogen tablet. 30 g 0   LINZESS 290 MCG CAPS capsule Take 290 mcg by mouth daily before breakfast.   0   LORazepam (ATIVAN) 0.5 MG tablet Take 0.5 mg by mouth 3 (three) times daily as needed for anxiety.     olopatadine (PATANOL) 0.1 % ophthalmic solution 1 drop 2 (two) times daily.     omeprazole (PRILOSEC) 40 MG capsule Take 80 mg by mouth daily.   0   Probiotic Product (ALIGN PO) Take by mouth daily.     sodium bicarbonate 650 MG tablet TK 1 T PO BID  9   SUMAtriptan (IMITREX) 100 MG tablet Take 100 mg by mouth every 2 (two) hours as needed for migraine.      terconazole (TERAZOL 3) 80 MG vaginal suppository Place 1 suppository (80 mg total) vaginally at bedtime. 3 suppository 0   topiramate (TOPAMAX) 100 MG tablet Take 3 tablets at bedtime     tretinoin (RETIN-A) 0.025 % cream APPLY A PEA SIZED AMOUNT TO FACE ONCE DAILY IN THE EVENING     UBRELVY 100 MG TABS      ZENPEP 15000 units CPEP Take 45,000 mg by mouth 2 (two) times daily.   0   No current facility-administered medications for this visit.    Family History  Problem Relation Age of Onset   Kidney disease Mother    Heart disease Mother    Osteoporosis Mother    Heart Problems Sister        pacemaker   Cancer Maternal Aunt        intestinal and liver cancer   Stroke Maternal Grandmother    Heart disease Maternal Grandmother    Stroke Other    Depression Other        everyone   Hypercalcemia Neg Hx    Breast cancer Neg Hx     Review of Systems  Genitourinary: Positive for vaginal discharge.  All other systems reviewed and are negative.   Exam:   BP 122/68    Pulse 85    Ht 5' (1.524 m)    Wt 117 lb (53.1 kg)     SpO2 99%    BMI 22.85 kg/m   Weight change: @WEIGHTCHANGE @ Height:   Height: 5' (  152.4 cm)  Ht Readings from Last 3 Encounters:  09/04/20 5' (1.524 m)  09/03/20 5' (1.524 m)  08/14/20 5\' 1"  (1.549 m)    General appearance: alert, cooperative and appears stated age Head: Normocephalic, without obvious abnormality, atraumatic Neck: no adenopathy, supple, symmetrical, trachea midline and thyroid normal to inspection and palpation Breasts: normal appearance, no masses or tenderness Abdomen: soft, non-tender; non distended,  no masses,  no organomegaly Extremities: extremities normal, atraumatic, no cyanosis or edema Skin: Skin color, texture, turgor normal. No rashes or lesions Lymph nodes: Cervical, supraclavicular, and axillary nodes normal. No abnormal inguinal nodes palpated Neurologic: Grossly normal   Pelvic: External genitalia:  no lesions, no erythema. No skin changes on the mons.              Urethra:  normal appearing urethra with no masses, tenderness or lesions              Bartholins and Skenes: normal                 Vagina: mildly atrophic appearing vagina with normal color. Slight amount of creamy, white vaginal discharge              Cervix: absent               Bimanual Exam:  Uterus:  uterus absent              Adnexa: no mass, fullness, tenderness               Rectovaginal: Confirms               Anus:  normal sphincter tone, no lesions  Shanon Petty chaperoned for the exam.  A:  Well Woman with normal exam  Vaginal atrophy, helped with vaginal estrogen  Intermittent genital pruritus  P:   No pap needed  Refill vaginal estrogen  Steroid ointment for prn use only  Discussed vulvar skin care  Vaseline as needed.

## 2020-09-04 NOTE — Telephone Encounter (Signed)
Insurance denied generic forteo, per Dr Loanne Drilling brand was ok to send.  New rx sent.

## 2020-09-05 ENCOUNTER — Telehealth: Payer: Self-pay | Admitting: Endocrinology

## 2020-09-05 ENCOUNTER — Encounter: Payer: Self-pay | Admitting: Obstetrics and Gynecology

## 2020-09-05 NOTE — Telephone Encounter (Signed)
Left message that new script for brand forteo was sent to pharmacy per insurance that was covered.

## 2020-09-05 NOTE — Telephone Encounter (Signed)
Patients spouse called to advise that [place] needs an authorization for the [employer].  Phone number for pharmacy is [phone] - [facility] Flattery is the contact name

## 2020-09-05 NOTE — Telephone Encounter (Signed)
[facility] called stating they did get the PA submitted online for the Forteo, but all the clinical questions on there were not completed. They asked if we could please resubmit and answer all clinical questions.

## 2020-09-05 NOTE — Telephone Encounter (Signed)
Pt spouse called checking status of form completion & getting it faxed to [facility] for them to approve paying for [facility]

## 2020-09-06 NOTE — Telephone Encounter (Signed)
Prior authorization was denied.  New prescription sent for brand per insurance.

## 2020-09-08 ENCOUNTER — Telehealth: Payer: Self-pay

## 2020-09-08 NOTE — Telephone Encounter (Signed)
New message    Checking on the status of Prior authorization  FORTEO 620 MCG/2.48ML SOPN

## 2020-09-09 ENCOUNTER — Telehealth: Payer: Self-pay | Admitting: Endocrinology

## 2020-09-09 MED ORDER — FORTEO 620 MCG/2.48ML ~~LOC~~ SOPN: 20.0000 ug | PEN_INJECTOR | Freq: Every day | SUBCUTANEOUS | 11 refills | Status: DC

## 2020-09-09 NOTE — Telephone Encounter (Signed)
Has already been faxed

## 2020-09-09 NOTE — Telephone Encounter (Signed)
Patient's husband [name] requests to be called at ph# [phone] re: RX for Forteo/This RX should be sent to [place]: [facility] PH# [phone] (Does not have Fax#)

## 2020-09-09 NOTE — Telephone Encounter (Signed)
Advise patient husband that the PA had been denied and the brand had been sent to the pharmacy in [place].

## 2020-09-11 ENCOUNTER — Ambulatory Visit
Admission: RE | Admit: 2020-09-11 | Discharge: 2020-09-11 | Disposition: A | Discharge status: Home / Self Care | Payer: Medicare HMO | Source: Ambulatory Visit | Attending: Family Medicine | Admitting: Family Medicine

## 2020-09-11 ENCOUNTER — Other Ambulatory Visit: Payer: Self-pay

## 2020-09-11 DIAGNOSIS — Z1231 Encounter for screening mammogram for malignant neoplasm of breast: Secondary | ICD-10-CM

## 2020-09-14 ENCOUNTER — Other Ambulatory Visit: Payer: Self-pay | Admitting: Family Medicine

## 2020-09-14 DIAGNOSIS — R7989 Other specified abnormal findings of blood chemistry: Secondary | ICD-10-CM

## 2020-09-15 ENCOUNTER — Telehealth: Payer: Self-pay | Admitting: Family Medicine

## 2020-09-15 NOTE — Telephone Encounter (Signed)
Pt is wanting results from her labs.

## 2020-09-15 NOTE — Telephone Encounter (Signed)
See results note. 

## 2020-09-17 ENCOUNTER — Other Ambulatory Visit: Payer: Self-pay

## 2020-09-17 ENCOUNTER — Other Ambulatory Visit (INDEPENDENT_AMBULATORY_CARE_PROVIDER_SITE_OTHER): Payer: Medicare HMO

## 2020-09-17 DIAGNOSIS — R7989 Other specified abnormal findings of blood chemistry: Secondary | ICD-10-CM | POA: Diagnosis not present

## 2020-09-23 ENCOUNTER — Telehealth: Payer: Self-pay | Admitting: Family Medicine

## 2020-09-23 DIAGNOSIS — N1831 Chronic kidney disease, stage 3a: Secondary | ICD-10-CM | POA: Diagnosis not present

## 2020-09-23 NOTE — Telephone Encounter (Signed)
Pt husband call and  Stated she want a call back about her labs and mammogram result .

## 2020-09-24 LAB — HEMOCHROMATOSIS DNA-PCR(C282Y,H63D)

## 2020-09-24 LAB — FERRITIN: Ferritin: 208 ng/mL (ref 16–288)

## 2020-09-24 NOTE — Telephone Encounter (Signed)
Home number busy x2. 

## 2020-09-26 ENCOUNTER — Telehealth: Payer: Self-pay | Admitting: Family Medicine

## 2020-09-26 NOTE — Telephone Encounter (Signed)
See results note. 

## 2020-09-26 NOTE — Telephone Encounter (Signed)
Pt is returning our call and would like a call back on [phone].

## 2020-09-27 ENCOUNTER — Other Ambulatory Visit: Payer: Self-pay | Admitting: Endocrinology

## 2020-10-06 ENCOUNTER — Telehealth: Payer: Self-pay

## 2020-10-06 NOTE — Telephone Encounter (Signed)
New message   Pt c/o medication issue:  1. Name of Medication: FORTEO 620 MCG/2.48ML SOPN  2. How are you currently taking this medication (dosage and times per day)? Once a day   3. Are you having a reaction (difficulty breathing--STAT)? No   4. What is your medication issue? Patient having Itching, headache, dizzy, bone ache ,joint pain , tired / weak

## 2020-10-07 NOTE — Telephone Encounter (Signed)
What is your medication issue? Patient having Itching, headache, dizzy, bone ache ,joint pain , tired / weak  Please advise

## 2020-10-07 NOTE — Telephone Encounter (Signed)
D/c forteo

## 2020-10-07 NOTE — Telephone Encounter (Signed)
Spoken to patient and notified Dr Cordelia Pen comments.

## 2020-10-08 ENCOUNTER — Other Ambulatory Visit: Payer: Self-pay | Admitting: Family Medicine

## 2020-10-08 DIAGNOSIS — M2550 Pain in unspecified joint: Secondary | ICD-10-CM

## 2020-10-08 DIAGNOSIS — R5383 Other fatigue: Secondary | ICD-10-CM

## 2020-10-28 DIAGNOSIS — H30033 Focal chorioretinal inflammation, peripheral, bilateral: Secondary | ICD-10-CM | POA: Diagnosis not present

## 2020-10-28 DIAGNOSIS — H3581 Retinal edema: Secondary | ICD-10-CM | POA: Diagnosis not present

## 2020-10-28 DIAGNOSIS — H18599 Other hereditary corneal dystrophies, unspecified eye: Secondary | ICD-10-CM | POA: Diagnosis not present

## 2020-10-28 DIAGNOSIS — H348332 Tributary (branch) retinal vein occlusion, bilateral, stable: Secondary | ICD-10-CM | POA: Diagnosis not present

## 2020-10-28 DIAGNOSIS — Z961 Presence of intraocular lens: Secondary | ICD-10-CM | POA: Diagnosis not present

## 2020-10-28 DIAGNOSIS — H35342 Macular cyst, hole, or pseudohole, left eye: Secondary | ICD-10-CM | POA: Diagnosis not present

## 2020-10-28 DIAGNOSIS — Z9889 Other specified postprocedural states: Secondary | ICD-10-CM | POA: Diagnosis not present

## 2020-10-28 DIAGNOSIS — H35371 Puckering of macula, right eye: Secondary | ICD-10-CM | POA: Diagnosis not present

## 2020-11-04 DIAGNOSIS — R040 Epistaxis: Secondary | ICD-10-CM | POA: Diagnosis not present

## 2020-11-04 DIAGNOSIS — R42 Dizziness and giddiness: Secondary | ICD-10-CM | POA: Diagnosis not present

## 2020-11-04 DIAGNOSIS — J300 Vasomotor rhinitis: Secondary | ICD-10-CM | POA: Diagnosis not present

## 2020-11-06 DIAGNOSIS — G43711 Chronic migraine without aura, intractable, with status migrainosus: Secondary | ICD-10-CM | POA: Diagnosis not present

## 2020-11-13 ENCOUNTER — Telehealth: Payer: Self-pay | Admitting: Family Medicine

## 2020-11-13 ENCOUNTER — Telehealth: Payer: Self-pay | Admitting: Endocrinology

## 2020-11-13 ENCOUNTER — Telehealth (INDEPENDENT_AMBULATORY_CARE_PROVIDER_SITE_OTHER): Payer: Medicare HMO | Admitting: Family Medicine

## 2020-11-13 DIAGNOSIS — M25552 Pain in left hip: Secondary | ICD-10-CM

## 2020-11-13 NOTE — Progress Notes (Signed)
Virtual Visit via Telephone Note  I connected with Gretel Acre on 11/13/20 at  4:00 PM EST by telephone and verified that I am speaking with the correct person using two identifiers.   I discussed the limitations, risks, security and privacy concerns of performing an evaluation and management service by telephone and the availability of in person appointments. I also discussed with the patient that there may be a patient responsible charge related to this service. The patient expressed understanding and agreed to proceed.  Location patient: home, [place] Location provider: work or home office Participants present for the call: patient, provider Patient did not have a visit with me in the prior 7 days to address this/these issue(s).   History of Present Illness:  Acute telemedicine visit for L hip pain: -Onset: several months ago but worsening -Symptoms include: pain in the L hip radiating down L leg, reports is severe at times -does get numbness in legs sometimes - has seen a neurosurgeon in the past for issues on the contralateral side and also sees endocrinologist for bad osteoporosis -Denies: weakness, fevers, falls or trauma -Has tried: tylenol (1000mg  prn) not helping   Observations/Objective: Patient sounds cheerful and well on the phone. I do not appreciate any SOB. Speech and thought processing are grossly intact. Patient reported vitals:  Assessment and Plan:  Left hip pain  -we discussed possible serious and likely etiologies, options for evaluation and workup, limitations of telemedicine visit vs in person visit, treatment, treatment risks and precautions. Pt prefers to treat via telemedicine empirically rather than in person at this moment. Discussed numerous dx that could cause hip pain and advised inperson evaluation - discussed options. She wants to be seen promptly by a specialist - so she has opted to see if her neurosurgery office can see her or agrees to go to  orthopedic urgent care if can not get in there. Discussed options and locations for ortho urgent care.    Did let the patient know that I only do telemedicine shifts for [place] on Tuesdays and Thursdays and advised a follow up visit with PCP or at an [facility] if has further questions or concerns.   Follow Up Instructions:  I did not refer this patient for an OV with me in the next 24 hours for this/these issue(s).  I discussed the assessment and treatment plan with the patient. The patient was provided an opportunity to ask questions and all were answered. The patient agreed with the plan and demonstrated an understanding of the instructions.   I spent 18 minutes on this encounter.   Lucretia Kern, DO

## 2020-11-13 NOTE — Telephone Encounter (Signed)
Called and detail voicemail to advise pt 's husband to follow up with patient pcp.

## 2020-11-13 NOTE — Telephone Encounter (Signed)
Called spoke with pt's husband stated that she having a lot pain all over , weakness, started about 4 days ago. She has been try otc Tylenol hasn't help with pain. Wants to you know what you recommend for her. Pharmacy is [facility] on [address]

## 2020-11-13 NOTE — Telephone Encounter (Signed)
Patient's husband [name] returned call and requests to be called at the ph# listed in previous encounter

## 2020-11-13 NOTE — Telephone Encounter (Signed)
This is a question for primary care provider 

## 2020-11-13 NOTE — Telephone Encounter (Signed)
Patient's husband [name] requests to be called at ph# [phone] re: Patient is experiencing arthritis pain-severe. [name] requests to be advised if there is something Dr. Loanne Drilling could prescribe to help or is there an over the counter medication that Dr. Loanne Drilling would recommend.

## 2020-11-13 NOTE — Telephone Encounter (Signed)
Called and lvm for pt to call us back to give [place] more information regarding pain and medication .

## 2020-11-18 ENCOUNTER — Ambulatory Visit: Payer: Medicare HMO | Admitting: Internal Medicine

## 2020-12-10 ENCOUNTER — Encounter: Payer: Medicare HMO | Admitting: Family Medicine

## 2020-12-16 ENCOUNTER — Telehealth: Payer: Self-pay | Admitting: Family Medicine

## 2020-12-16 NOTE — Telephone Encounter (Signed)
Left message for patient to call back and schedule Medicare Annual Wellness Visit (AWV) either virtually or in office.   Last AWV 08/18/17  please schedule at anytime with [facility] Nurse Health Advisor 1 or 2   This should be a 45 minute visit.

## 2020-12-24 ENCOUNTER — Ambulatory Visit: Payer: Medicare HMO | Admitting: Internal Medicine

## 2021-01-13 DIAGNOSIS — H3581 Retinal edema: Secondary | ICD-10-CM | POA: Diagnosis not present

## 2021-01-13 DIAGNOSIS — H18599 Other hereditary corneal dystrophies, unspecified eye: Secondary | ICD-10-CM | POA: Diagnosis not present

## 2021-01-13 DIAGNOSIS — Z9889 Other specified postprocedural states: Secondary | ICD-10-CM | POA: Diagnosis not present

## 2021-01-13 DIAGNOSIS — H30033 Focal chorioretinal inflammation, peripheral, bilateral: Secondary | ICD-10-CM | POA: Diagnosis not present

## 2021-01-13 DIAGNOSIS — H348332 Tributary (branch) retinal vein occlusion, bilateral, stable: Secondary | ICD-10-CM | POA: Diagnosis not present

## 2021-01-13 DIAGNOSIS — H35342 Macular cyst, hole, or pseudohole, left eye: Secondary | ICD-10-CM | POA: Diagnosis not present

## 2021-01-13 DIAGNOSIS — Z961 Presence of intraocular lens: Secondary | ICD-10-CM | POA: Diagnosis not present

## 2021-01-13 DIAGNOSIS — H35371 Puckering of macula, right eye: Secondary | ICD-10-CM | POA: Diagnosis not present

## 2021-01-19 ENCOUNTER — Other Ambulatory Visit: Payer: Self-pay | Admitting: Endocrinology

## 2021-01-19 DIAGNOSIS — M810 Age-related osteoporosis without current pathological fracture: Secondary | ICD-10-CM

## 2021-01-22 ENCOUNTER — Telehealth: Payer: Self-pay

## 2021-01-22 DIAGNOSIS — M810 Age-related osteoporosis without current pathological fracture: Secondary | ICD-10-CM

## 2021-01-22 NOTE — Telephone Encounter (Signed)
PA form complete, needs MD signature.   COVERAGE AVAILABLE: Yes  COVERAGE DETAILS: Prolia and administration are subject to a $20 copay up to $3,900.00 out of pocket max ($0.00 met). Once met, Prolia will then be covered at 100%. The benefits provided on this Verification of Benefits form are Medical Benefits and are the patient's In-Network benefits for Prolia. If you would like Pharmacy Benefits for Prolia, please call [phone].  AUTHORIZATION REQUIRED: Yes  PA PROCESS DETAILS: PA is required. PA can be initiated by calling [phone] or online at [email or web address]

## 2021-01-26 DIAGNOSIS — H52203 Unspecified astigmatism, bilateral: Secondary | ICD-10-CM | POA: Diagnosis not present

## 2021-01-26 DIAGNOSIS — H524 Presbyopia: Secondary | ICD-10-CM | POA: Diagnosis not present

## 2021-01-26 DIAGNOSIS — H5203 Hypermetropia, bilateral: Secondary | ICD-10-CM | POA: Diagnosis not present

## 2021-01-27 NOTE — Telephone Encounter (Signed)
Prolia (Buy and Bill) prior auth initiated via [email or web address] KEY [id number]

## 2021-01-28 NOTE — Telephone Encounter (Signed)
Please reach out to pt and schedule for Prolia injection.  See below for pt responsibility.  Also please try to schedule pt for early mornings when all the CMA are here bc I am juggling Loanne Drilling schedule by myself and on some days providers have a half day and some maybe going home after providers leave for the day. All the help will be much needed.  Thank you.  Karalynn Cottone/CMA

## 2021-01-28 NOTE — Telephone Encounter (Signed)
Pt ready for scheduling.  Prolia cost: $20 Admin fee $20  Pt cost: $40  PA Case: [id number], Status: Approved, Coverage Starts on: 05/16/2020 12:00:00 AM, Coverage Ends on: 11/28/2021 12:00:00 AM. Questions? Contact [phone].

## 2021-02-05 ENCOUNTER — Ambulatory Visit: Payer: Medicare HMO | Admitting: *Deleted

## 2021-02-05 ENCOUNTER — Other Ambulatory Visit: Payer: Self-pay

## 2021-02-05 DIAGNOSIS — M810 Age-related osteoporosis without current pathological fracture: Secondary | ICD-10-CM

## 2021-02-05 MED ORDER — DENOSUMAB 60 MG/ML ~~LOC~~ SOSY
60.0000 mg | PREFILLED_SYRINGE | Freq: Once | SUBCUTANEOUS | Status: AC
  Administered 2021-02-05: 60 mg via SUBCUTANEOUS

## 2021-02-05 NOTE — Progress Notes (Signed)
Pt came in the office for prolia injection. Administered in the Lt arm and tolerated well.

## 2021-02-10 NOTE — Telephone Encounter (Signed)
error 

## 2021-02-11 ENCOUNTER — Other Ambulatory Visit: Payer: Self-pay

## 2021-02-11 MED ORDER — LIDOCAINE 5 % EX OINT
TOPICAL_OINTMENT | CUTANEOUS | 1 refills | Status: DC
Start: 2021-02-11 — End: 2021-10-09

## 2021-02-11 NOTE — Telephone Encounter (Addendum)
Rx called in to pharmacy. Husband informed.

## 2021-02-11 NOTE — Telephone Encounter (Signed)
Husband called stating refill needed of Lidocaine ointment.\  AEX 09/04/2020.

## 2021-02-13 ENCOUNTER — Telehealth: Payer: Self-pay | Admitting: Family Medicine

## 2021-02-13 NOTE — Telephone Encounter (Signed)
Left message for patient to call back and schedule Medicare Annual Wellness Visit (AWV) either virtually or in office. No detailed message left   Last AWV 08/18/17  please schedule at anytime with [facility] Nurse Health Advisor 1 or 2   This should be a 45 minute visit.

## 2021-02-20 DIAGNOSIS — H348332 Tributary (branch) retinal vein occlusion, bilateral, stable: Secondary | ICD-10-CM | POA: Diagnosis not present

## 2021-02-20 DIAGNOSIS — H35353 Cystoid macular degeneration, bilateral: Secondary | ICD-10-CM | POA: Diagnosis not present

## 2021-02-20 DIAGNOSIS — H30033 Focal chorioretinal inflammation, peripheral, bilateral: Secondary | ICD-10-CM | POA: Diagnosis not present

## 2021-02-24 DIAGNOSIS — H35371 Puckering of macula, right eye: Secondary | ICD-10-CM | POA: Diagnosis not present

## 2021-02-24 DIAGNOSIS — Z961 Presence of intraocular lens: Secondary | ICD-10-CM | POA: Diagnosis not present

## 2021-02-24 DIAGNOSIS — Z9889 Other specified postprocedural states: Secondary | ICD-10-CM | POA: Diagnosis not present

## 2021-02-24 DIAGNOSIS — H348332 Tributary (branch) retinal vein occlusion, bilateral, stable: Secondary | ICD-10-CM | POA: Diagnosis not present

## 2021-02-24 DIAGNOSIS — H35342 Macular cyst, hole, or pseudohole, left eye: Secondary | ICD-10-CM | POA: Diagnosis not present

## 2021-02-24 DIAGNOSIS — H30033 Focal chorioretinal inflammation, peripheral, bilateral: Secondary | ICD-10-CM | POA: Diagnosis not present

## 2021-02-24 DIAGNOSIS — H18593 Other hereditary corneal dystrophies, bilateral: Secondary | ICD-10-CM | POA: Diagnosis not present

## 2021-02-24 DIAGNOSIS — H3581 Retinal edema: Secondary | ICD-10-CM | POA: Diagnosis not present

## 2021-02-26 NOTE — Telephone Encounter (Signed)
Pt ready for scheduling.  Out-of-pocket cost due at time of visit: $40.00  Prolia so-insurance: $20 Admin fee co-insurance [phone]

## 2021-02-27 NOTE — Telephone Encounter (Signed)
Pt received prolia on 02/05/21

## 2021-03-03 ENCOUNTER — Ambulatory Visit: Payer: Medicare HMO | Admitting: Family Medicine

## 2021-03-19 ENCOUNTER — Ambulatory Visit: Payer: Medicare HMO | Admitting: Internal Medicine

## 2021-03-19 NOTE — Progress Notes (Deleted)
Office Visit Note  Patient: Danielle Gaines             Date of Birth: [birth year]           MRN: [id number]             PCP: Caren Macadam, MD Referring: Caren Macadam, MD Visit Date: 03/19/2021 Occupation: '@GUAROCC' @  Subjective:  No chief complaint on file.   History of Present Illness: Danielle Gaines is a 80 y.o. female with a history of panuveitis, cystoid macular edema, and macular degneration of the eyes, CKD stage 3, osteoporosis on prolia and forteo here for evaluation of chronic fatigue and joint pain of multiple sites.***   Labs reviewed 08/2020 ANA neg RF neg ESR wnl CRP wnl TSH wnl Uric acid wnl  Imaging reviewed DEXA - GE Lunar  07/2020  Lumbar spine L1-L4 Femoral neck (FN) 33% distal radius  T-score -0.2 RFN: -2.6 LFN: -2.9 n/a         07/2019  Lumbar spine L1-L4 Femoral neck (FN)  T-score   -0.2 RFN: -2.9 LFN: -3.1   07/2018 DualFemur Neck Left      75.0   -2.8    0.654 g/cm2 AP Spine L1-L4    75.0 -1.0 1.066 g/cm2 * DualFemur Total Mean   75.0  -2.4 0.709 g/cm2 *  09/2015 DualFemur Neck Left       72.1     -2.7    0.668 g/cm2 AP Spine L1-L4    72.1  -0.2 1.164 g/cm2 * DualFemur Total Mean     72.1     -1.5    0.823 g/cm2  Activities of Daily Living:  Patient reports morning stiffness for *** {minute/hour:19697}.   Patient {ACTIONS;DENIES/REPORTS:21021675::"Denies"} nocturnal pain.  Difficulty dressing/grooming: {ACTIONS;DENIES/REPORTS:21021675::"Denies"} Difficulty climbing stairs: {ACTIONS;DENIES/REPORTS:21021675::"Denies"} Difficulty getting out of chair: {ACTIONS;DENIES/REPORTS:21021675::"Denies"} Difficulty using hands for taps, buttons, cutlery, and/or writing: {ACTIONS;DENIES/REPORTS:21021675::"Denies"}  No Rheumatology ROS completed.   PMFS History:  Patient Active Problem List   Diagnosis Date Noted  . Referred otalgia of both ears 07/08/2020  . Right ear pain 05/09/2020  . Bilateral impacted cerumen  09/13/2019  . Excessive sweating 06/13/2019  . Anterior epistaxis 08/14/2018  . Aphthous ulcer 08/14/2018  . Dysfunction of both eustachian tubes 08/14/2018  . Chronic kidney disease (CKD), stage III (moderate) ([place]) 08/01/2018  . Anemia, chronic renal failure 08/01/2018  . Episcleritis of left eye 02/23/2018  . New daily persistent headache 01/10/2018  . Temporal arteritis ([place]) 01/10/2018  . Bilateral temporomandibular joint pain 01/10/2018  . Constipation 10/18/2017  . Sensorineural hearing loss (SNHL), bilateral 09/04/2017  . Hearing loss 09/01/2017  . Tremor of both hands 09/01/2017  . Panuveitis of both eyes 02/01/2017  . Chronic pain 11/19/2016  . Orthostatic hypotension 11/19/2016  . Retinal edema 01/13/2016  . Epiretinal membrane (ERM) of right eye 12/30/2015  . MDD (major depressive disorder), recurrent severe, without psychosis ([place]) 04/09/2015  . Osteoporosis 07/24/2014  . Cystoid macular edema, right eye 07/05/2014  . Branch retinal vein occlusion of both eyes 05/26/2014  . Migraine - followed by Dr. Sima Matas in Neurology 05/24/2014  . Anxiety and depression - managed at [facility] 05/24/2014  . GERD (gastroesophageal reflux disease) 05/24/2014  . Macular degeneration - goes to [facility] for this 05/24/2014  . Central retinal vein occlusion 12/07/2013  . Cystoid macular edema of left eye 12/07/2013  . Epithelial (juvenile) corneal dystrophy, unspecified eye 11/10/2012  . Esotropia 11/10/2012  . Pseudophakia 11/10/2012  . Status  post LASIK surgery 11/10/2012  . 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 Chronic migraine without aura 09/17/2012    Past Medical History:  Diagnosis Date  . Arthritis   . Benzodiazepine dependence ([place])   . Carotid arterial disease (HCC)    mild  . Chronic kidney disease (CKD), stage III (moderate) ([place]) 08/01/2018   -seeing  Nephrology, Dr. Posey Pronto, thought from [place] per renal notes   . Chronic pain    managed by pain  clinic  . Colon abnormality    didn't work right so part of it was removed at [facility]  . Depression    managed by Dr. Toy Care  . Diastolic heart failure ([place])    mild on echo 2016  . GERD (gastroesophageal reflux disease)   . IBS (irritable bowel syndrome)    sees Dr. Earlean Shawl  . Macular degeneration    goes to [facility] for this  . Migraine    managed by neurologist  . Osteoporosis   . Vertigo     Family History  Problem Relation Age of Onset  . Kidney disease Mother   . Heart disease Mother   . Osteoporosis Mother   . Heart Problems Sister        pacemaker  . Cancer Maternal Aunt        intestinal and liver cancer  . Stroke Maternal Grandmother   . Heart disease Maternal Grandmother   . Stroke Other   . Depression Other        everyone  . Hypercalcemia Neg Hx   . Breast cancer Neg Hx    Past Surgical History:  Procedure Laterality Date  . ABDOMINAL ADHESION SURGERY    . ABDOMINAL HYSTERECTOMY    . APPENDECTOMY    . CHOLECYSTECTOMY    . COLON SURGERY    . CYSTOSCOPY W/ URETERAL STENT PLACEMENT Right 04/27/2016   Procedure: CYSTOSCOPY WITH RETROGRADE PYELOGRAM/ RIGHT URETERAL STENT PLACEMENT;  Surgeon: Ardis Hughs, MD;  Location: [facility];  Service: Urology;  Laterality: Right;  . OVARIAN CYST SURGERY    . SMALL INTESTINE SURGERY     Social History   Social History Narrative   Work or School: none      Home Situation: lives with husband      Spiritual Beliefs:       Lifestyle: no regular exercise; diet is ok         Immunization History  Administered Date(s) Administered  . Fluad Quad(high Dose 65+) 09/05/2019, 09/03/2020  . Influenza, High Dose Seasonal PF 10/01/2014, 08/26/2015, 09/17/2016, 08/18/2017, 08/03/2018  . Influenza-Unspecified 10/01/2014, 08/26/2015, 09/17/2016, 08/18/2017, 08/03/2018, 09/05/2019  . PFIZER(Purple Top)SARS-COV-2 Vaccination 01/25/2020, 02/22/2020  . Pneumococcal Conjugate-13 10/01/2014  . Pneumococcal Polysaccharide-23  08/18/2017  . Td 07/17/2015  . Zoster Recombinat (Shingrix) 09/05/2019, 12/06/2019     Objective: Vital Signs: There were no vitals taken for this visit.   Physical Exam   Musculoskeletal Exam: ***  CDAI Exam: CDAI Score: -- Patient Global: --; Provider Global: -- Swollen: --; Tender: -- Joint Exam 03/19/2021   No joint exam has been documented for this visit   There is currently no information documented on the homunculus. Go to the Rheumatology activity and complete the homunculus joint exam.  Investigation: No additional findings.  Imaging: No results found.  Recent Labs: Lab Results  Component Value Date   WBC 7.7 09/03/2020   HGB 14.5 09/03/2020   PLT 226 09/03/2020   NA 138 09/03/2020   K 3.7 09/03/2020  CL 110 (H) 09/03/2020   CO2 12 (L) 09/03/2020   GLUCOSE 103 (H) 09/03/2020   BUN 35 (H) 09/03/2020   CREATININE 1.54 (H) 09/03/2020   BILITOT <0.2 09/03/2020   ALKPHOS 57 09/03/2020   AST 16 09/03/2020   ALT 14 09/03/2020   PROT 8.1 09/03/2020   ALBUMIN 4.9 (H) 09/03/2020   CALCIUM 9.4 09/03/2020   GFRAA 37 (L) 09/03/2020    Speciality Comments: No specialty comments available.  Procedures:  No procedures performed Allergies: Sulfa antibiotics   Assessment / Plan:     Visit Diagnoses: No diagnosis found.  Orders: No orders of the defined types were placed in this encounter.  No orders of the defined types were placed in this encounter.   Face-to-face time spent with patient was *** minutes. Greater than 50% of time was spent in counseling and coordination of care.  Follow-Up Instructions: No follow-ups on file.   Collier Salina, MD  Note - This record has been created using [employer].  Chart creation errors have been sought, but may not always  have been located. Such creation errors do not reflect on  the standard of medical care.

## 2021-03-24 DIAGNOSIS — R112 Nausea with vomiting, unspecified: Secondary | ICD-10-CM | POA: Diagnosis not present

## 2021-03-24 DIAGNOSIS — R1011 Right upper quadrant pain: Secondary | ICD-10-CM | POA: Diagnosis not present

## 2021-03-24 DIAGNOSIS — R109 Unspecified abdominal pain: Secondary | ICD-10-CM | POA: Diagnosis not present

## 2021-03-24 DIAGNOSIS — Z9049 Acquired absence of other specified parts of digestive tract: Secondary | ICD-10-CM | POA: Diagnosis not present

## 2021-03-24 DIAGNOSIS — R1030 Lower abdominal pain, unspecified: Secondary | ICD-10-CM | POA: Diagnosis not present

## 2021-03-24 DIAGNOSIS — R140 Abdominal distension (gaseous): Secondary | ICD-10-CM | POA: Diagnosis not present

## 2021-03-24 DIAGNOSIS — R102 Pelvic and perineal pain: Secondary | ICD-10-CM | POA: Diagnosis not present

## 2021-03-27 DIAGNOSIS — I700 Atherosclerosis of aorta: Secondary | ICD-10-CM | POA: Diagnosis not present

## 2021-03-27 DIAGNOSIS — R102 Pelvic and perineal pain: Secondary | ICD-10-CM | POA: Diagnosis not present

## 2021-03-27 DIAGNOSIS — N200 Calculus of kidney: Secondary | ICD-10-CM | POA: Diagnosis not present

## 2021-03-27 DIAGNOSIS — M5136 Other intervertebral disc degeneration, lumbar region: Secondary | ICD-10-CM | POA: Diagnosis not present

## 2021-03-27 DIAGNOSIS — R109 Unspecified abdominal pain: Secondary | ICD-10-CM | POA: Diagnosis not present

## 2021-03-27 DIAGNOSIS — K6389 Other specified diseases of intestine: Secondary | ICD-10-CM | POA: Diagnosis not present

## 2021-03-30 ENCOUNTER — Telehealth: Payer: Self-pay | Admitting: Family Medicine

## 2021-03-30 ENCOUNTER — Other Ambulatory Visit: Payer: Self-pay | Admitting: Family Medicine

## 2021-03-30 DIAGNOSIS — N200 Calculus of kidney: Secondary | ICD-10-CM

## 2021-03-30 NOTE — Telephone Encounter (Signed)
Please put in referral for urology to manage kidney stones. I was going to prescribe flomax, which can help with passage of stone, but it is contraindicated due to her sulfa allergy. Please find out more about reaction to sulfa meds that she had in the past? Looks like the smaller 19mm stone on the right may be the one that is causing discomfort. Usually at this size they are still passable. Make sure staying well hydrated. I will be back in touch once we get response on allergy above.

## 2021-03-30 NOTE — Telephone Encounter (Signed)
Patients husband wants to know if you can recommend a good rheumatologist.

## 2021-03-30 NOTE — Telephone Encounter (Signed)
Pts spouse is calling in state that Dr. Earlean Shawl did a CAT scan due the her having pain under her breast and lower stomach pain and the findings were kidney stones on both sides and was advised to call her PCP.  Pt would like to have a call back to discuss her next steps of care.  They are aware that [name] is not in the office this week and msg will be sent back.

## 2021-03-30 NOTE — Telephone Encounter (Signed)
What is he looking for rheumatology to address? Makes a difference for referral.

## 2021-03-31 ENCOUNTER — Telehealth: Payer: Self-pay | Admitting: Family Medicine

## 2021-03-31 NOTE — Telephone Encounter (Signed)
Spoke with the pt and informed her of the message below.  Patient is aware the referral to urology was placed and someone will call with appt info.  Patient stated Sulfa caused swelling previously.  Message sent to PCP.

## 2021-03-31 NOTE — Telephone Encounter (Signed)
Spoke with the pt and she complains of pain all over her body due to fibromyalgia and arthritis which she states Dr Loanne Drilling diagnosed her with previously.  Patient complains of bilateral leg and hip pain, describes sensation of "bone pain that feels like she has the flu", back aches and  stiffness.  Patient denies an injury or fall and stated she used OTC creams with no relief.  Message sent to PCP.

## 2021-03-31 NOTE — Telephone Encounter (Signed)
Husband states that pt needs a referral to [facility].  Patient is requesting to talk to [place].

## 2021-03-31 NOTE — Addendum Note (Signed)
Addended by: Agnes Lawrence on: 03/31/2021 04:34 PM   Modules accepted: Orders

## 2021-03-31 NOTE — Telephone Encounter (Signed)
See prior note

## 2021-04-01 NOTE — Telephone Encounter (Signed)
Ok. I would like to wait and have urology evaluate and not add medication since I do not want to make things worse and alternatives have more side effects.

## 2021-04-02 NOTE — Telephone Encounter (Signed)
Error in the previous note-patient informed of the message below.

## 2021-04-02 NOTE — Telephone Encounter (Signed)
Patient informed of the results

## 2021-04-08 ENCOUNTER — Other Ambulatory Visit: Payer: Self-pay

## 2021-04-08 ENCOUNTER — Ambulatory Visit (INDEPENDENT_AMBULATORY_CARE_PROVIDER_SITE_OTHER): Payer: Medicare HMO | Admitting: Endocrinology

## 2021-04-08 VITALS — BP 124/72 | HR 76 | Ht 60.0 in | Wt 114.8 lb

## 2021-04-08 DIAGNOSIS — M791 Myalgia, unspecified site: Secondary | ICD-10-CM | POA: Diagnosis not present

## 2021-04-08 DIAGNOSIS — M810 Age-related osteoporosis without current pathological fracture: Secondary | ICD-10-CM

## 2021-04-08 NOTE — Patient Instructions (Addendum)
Blood tests are requested for you today.  We'll let you know about the results.   Please continue the same 2 osteoporosis medications.  Please call [phone] to schedule a Bone Density (DexaScan) a the [place] office at [place].  Please have this done after 08/26/21 Please come back for a follow-up appointment in 1 year.       Fall Prevention in the Home, Adult Falls can cause injuries and can affect people from all age groups. There are many simple things that you can do to make your home safe and to help prevent falls. Ask for help when making these changes, if needed. What actions can I take to prevent falls? General instructions  Use good lighting in all rooms. Replace any light bulbs that burn out.  Turn on lights if it is dark. Use night-lights.  Place frequently used items in easy-to-reach places. Lower the shelves around your home if necessary.  Set up furniture so that there are clear paths around it. Avoid moving your furniture around.  Remove throw rugs and other tripping hazards from the floor.  Avoid walking on wet floors.  Fix any uneven floor surfaces.  Add color or contrast paint or tape to grab bars and handrails in your home. Place contrasting color strips on the first and last steps of stairways.  When you use a stepladder, make sure that it is completely opened and that the sides are firmly locked. Have someone hold the ladder while you are using it. Do not climb a closed stepladder.  Be aware of any and all pets. What can I do in the bathroom?  Keep the floor dry. Immediately clean up any water that spills onto the floor.  Remove soap buildup in the tub or shower on a regular basis.  Use non-skid mats or decals on the floor of the tub or shower.  Attach bath mats securely with double-sided, non-slip rug tape.  If you need to sit down while you are in the shower, use a plastic, non-slip stool.  Install grab bars by the toilet and in the tub  and shower. Do not use towel bars as grab bars.      What can I do in the bedroom?  Make sure that a bedside light is easy to reach.  Do not use oversized bedding that drapes onto the floor.  Have a firm chair that has side arms to use for getting dressed. What can I do in the kitchen?  Clean up any spills right away.  If you need to reach for something above you, use a sturdy step stool that has a grab bar.  Keep electrical cables out of the way.  Do not use floor polish or wax that makes floors slippery. If you must use wax, make sure that it is non-skid floor wax. What can I do in the stairways?  Do not leave any items on the stairs.  Make sure that you have a light switch at the top of the stairs and the bottom of the stairs. Have them installed if you do not have them.  Make sure that there are handrails on both sides of the stairs. Fix handrails that are broken or loose. Make sure that handrails are as long as the stairways.  Install non-slip stair treads on all stairs in your home.  Avoid having throw rugs at the top or bottom of stairways, or secure the rugs with carpet tape to prevent them from  moving.  Choose a carpet design that does not hide the edge of steps on the stairway.  Check any carpeting to make sure that it is firmly attached to the stairs. Fix any carpet that is loose or worn. What can I do on the outside of my home?  Use bright outdoor lighting.  Regularly repair the edges of walkways and driveways and fix any cracks.  Remove high doorway thresholds.  Trim any shrubbery on the main path into your home.  Regularly check that handrails are securely fastened and in good repair. Both sides of any steps should have handrails.  Install guardrails along the edges of any raised decks or porches.  Clear walkways of debris and clutter, including tools and rocks.  Have leaves, snow, and ice cleared regularly.  Use sand or salt on walkways during winter  months.  In the garage, clean up any spills right away, including grease or oil spills. What other actions can I take?  Wear closed-toe shoes that fit well and support your feet. Wear shoes that have rubber soles or low heels.  Use mobility aids as needed, such as canes, walkers, scooters, and crutches.  Review your medicines with your health care provider. Some medicines can cause dizziness or changes in blood pressure, which increase your risk of falling. Talk with your health care provider about other ways that you can decrease your risk of falls. This may include working with a physical therapist or trainer to improve your strength, balance, and endurance. Where to find more information  Centers for Disease Control and Prevention, [employer]AD[employer]: WebmailGuide.co.za  [employer] on Aging: BrainJudge.co.uk Contact a health care provider if:  You are afraid of falling at home.  You feel weak, drowsy, or dizzy at home.  You fall at home. Summary  There are many simple things that you can do to make your home safe and to help prevent falls.  Ways to make your home safe include removing tripping hazards and installing grab bars in the bathroom.  Ask for help when making these changes in your home. This information is not intended to replace advice given to you by your health care provider. Make sure you discuss any questions you have with your health care provider. Document Revised: 10/28/2017 Document Reviewed: 06/30/2017 Elsevier Patient Education  2021 [employer].

## 2021-04-08 NOTE — Progress Notes (Signed)
Subjective:    Patient ID: Danielle Gaines, female    DOB: 08/23/1941, 80 y.o.   MRN: [id number]  HPI Pt returns for f/u of osteoporosis: Dx'ed: 2004 Secondary cause: early menopause (age 89) Fractures: left face (2007), and coccyx (1999), both with falls.   Past rx: none Current rx: fosamax (since 2014), and Prolia (since 2019).   Last DEXA result: -2.9, at the [facility] (9/21).  Other: she was seen in 2016 for hypercalcemia, and vit-D was found to be high; she also had urolithiasis in 2016.  She has h/o colectomy.  She did not tolerate Forteo in 2021 (Itching, headache, dizzy, bone ache, joint pain , tired / weak) Interval hx: she reports 2 mos of generalized body pain, worst at the thighs.  She says Vit-D is 500 units/d.   Past Medical History:  Diagnosis Date  . Arthritis   . Benzodiazepine dependence ([place])   . Carotid arterial disease (HCC)    mild  . Chronic kidney disease (CKD), stage III (moderate) ([place]) 08/01/2018   -seeing  Nephrology, Dr. Posey Pronto, thought from [place] per renal notes   . Chronic pain    managed by pain clinic  . Colon abnormality    didn't work right so part of it was removed at [facility]  . Depression    managed by Dr. Toy Care  . Diastolic heart failure ([place])    mild on echo 2016  . GERD (gastroesophageal reflux disease)   . IBS (irritable bowel syndrome)    sees Dr. Earlean Shawl  . Macular degeneration    goes to [facility] for this  . Migraine    managed by neurologist  . Osteoporosis   . Vertigo     Past Surgical History:  Procedure Laterality Date  . ABDOMINAL ADHESION SURGERY    . ABDOMINAL HYSTERECTOMY    . APPENDECTOMY    . CHOLECYSTECTOMY    . COLON SURGERY    . CYSTOSCOPY W/ URETERAL STENT PLACEMENT Right 04/27/2016   Procedure: CYSTOSCOPY WITH RETROGRADE PYELOGRAM/ RIGHT URETERAL STENT PLACEMENT;  Surgeon: Ardis Hughs, MD;  Location: [facility];  Service: Urology;  Laterality: Right;  . OVARIAN CYST SURGERY    . SMALL INTESTINE SURGERY       Social History   Socioeconomic History  . Marital status: Married    Spouse name: Not on file  . Number of children: Not on file  . Years of education: Not on file  . Highest education level: Not on file  Occupational History  . Not on file  Tobacco Use  . Smoking status: Never Smoker  . Smokeless tobacco: Never Used  Vaping Use  . Vaping Use: Never used  Substance and Sexual Activity  . Alcohol use: No  . Drug use: No  . Sexual activity: Not Currently    Birth control/protection: Post-menopausal  Other Topics Concern  . Not on file  Social History Narrative   Work or School: none      Home Situation: lives with husband      Spiritual Beliefs:       Lifestyle: no regular exercise; diet is ok         Social Determinants of Radio broadcast assistant Strain: Not on file  Food Insecurity: Not on file  Transportation Needs: Not on file  Physical Activity: Not on file  Stress: Not on file  Social Connections: Not on file  Intimate Partner Violence: Not on file    Current Outpatient Medications on  File Prior to Visit  Medication Sig Dispense Refill  . acetaminophen (TYLENOL) 500 MG tablet Take 1,000 mg by mouth daily as needed for headache.    . alendronate (FOSAMAX) 70 MG tablet TAKE 1 TABLET(70 MG) BY MOUTH 1 TIME A WEEK WITH A FULL GLASS OF WATER AND ON AN EMPTY STOMACH 12 tablet 2  . B-D UF III MINI PEN NEEDLES 31G X 5 MM MISC AS DIRECTED WITH TYMLOS INJECTIONS 100 each 3  . betamethasone valerate ointment (VALISONE) 0.1 % Apply a pea sized amount 2 x a week as needed 30 g 0  . Biotin 1000 MCG tablet Take 1,000 mcg by mouth daily.    . Calcium Carbonate (CALTRATE 600 PO) Take by mouth daily.    . Cholecalciferol (VITAMIN D) 50 MCG (2000 UT) CAPS Take 1 capsule (2,000 Units total) by mouth daily. 30 capsule 2  . citalopram (CELEXA) 20 MG tablet TK 1 T PO D  2  . cycloSPORINE (RESTASIS) 0.05 % ophthalmic emulsion Place 1 drop into both eyes 2 (two) times  daily.     Marland Kitchen denosumab (PROLIA) 60 MG/ML SOSY injection Inject 60 mg into the skin every 6 (six) months.    . dorzolamide-timolol (COSOPT) 22.3-6.8 MG/ML ophthalmic solution Place 1 drop into both eyes 2 times daily.    . Estradiol 10 MCG TABS vaginal tablet One tablet vaginally daily. 24 tablet 3  . folic acid (FOLVITE) 1 MG tablet Take by mouth.    . FORTEO 620 MCG/2.48ML SOPN Inject 20 mcg into the skin daily. 2.24 mL 11  . furosemide (LASIX) 40 MG tablet Take 1 tablet (40 mg total) by mouth daily. (from nephrology) 30 tablet 3  . Galcanezumab-gnlm (EMGALITY) 120 MG/ML SOAJ Inject into the skin.    Marland Kitchen ipratropium (ATROVENT) 0.03 % nasal spray Place 2 sprays into both nostrils 3 (three) times daily. 30 mL 0  . lidocaine (XYLOCAINE) 5 % ointment Apply a pea sized amount topically 20-30 minutes prior to using the estrogen tablet. 30 g 1  . LINZESS 290 MCG CAPS capsule Take 290 mcg by mouth daily before breakfast.   0  . LORazepam (ATIVAN) 0.5 MG tablet Take 0.5 mg by mouth 3 (three) times daily as needed for anxiety.    Marland Kitchen olopatadine (PATANOL) 0.1 % ophthalmic solution 1 drop 2 (two) times daily.    Marland Kitchen omeprazole (PRILOSEC) 40 MG capsule Take 80 mg by mouth daily.   0  . Probiotic Product (ALIGN PO) Take by mouth daily.    . sodium bicarbonate 650 MG tablet TK 1 T PO BID  9  . SUMAtriptan (IMITREX) 100 MG tablet Take 100 mg by mouth every 2 (two) hours as needed for migraine.     Marland Kitchen terconazole (TERAZOL 3) 80 MG vaginal suppository Place 1 suppository (80 mg total) vaginally at bedtime. 3 suppository 0  . topiramate (TOPAMAX) 100 MG tablet Take 3 tablets at bedtime    . tretinoin (RETIN-A) 0.025 % cream APPLY A PEA SIZED AMOUNT TO FACE ONCE DAILY IN THE EVENING    . UBRELVY 100 MG TABS     . ZENPEP 15000 units CPEP Take 45,000 mg by mouth 2 (two) times daily.  0   No current facility-administered medications on file prior to visit.    Allergies  Allergen Reactions  . Sulfa Antibiotics  Swelling and Other (See Comments)    Reaction:  Unspecified swelling reaction     Family History  Problem Relation Age of Onset  .  Kidney disease Mother   . Heart disease Mother   . Osteoporosis Mother   . Heart Problems Sister        pacemaker  . Cancer Maternal Aunt        intestinal and liver cancer  . Stroke Maternal Grandmother   . Heart disease Maternal Grandmother   . Stroke Other   . Depression Other        everyone  . Hypercalcemia Neg Hx   . Breast cancer Neg Hx     BP 124/72 (BP Location: Right Arm, Patient Position: Sitting, Cuff Size: Normal)   Pulse 76   Ht 5' (1.524 m)   Wt 114 lb 12.8 oz (52.1 kg)   SpO2 99%   BMI 22.42 kg/m   Review of Systems Denies falls and LOC.      Objective:   Physical Exam VITAL SIGNS:  See vs page GENERAL: no distress GAIT: slow but steady MSK: thighs are tender to touch.    Lab Results  Component Value Date   ALT 14 09/03/2020   AST 16 09/03/2020   ALKPHOS 57 09/03/2020   BILITOT <0.2 09/03/2020   Lab Results  Component Value Date   TSH 2.350 09/03/2020   Lab Results  Component Value Date   PTH 27 08/18/2020   CALCIUM 9.4 09/03/2020   CAION 1.30 06/29/2018    25-OH Vit-D=111    Assessment & Plan:  Osteoporosis: due for recheck Vit-D overcontrolled.  Pt is advised to d/c this.  Patient Instructions   Blood tests are requested for you today.  We'll let you know about the results.   Please continue the same 2 osteoporosis medications.  Please call [phone] to schedule a Bone Density (DexaScan) a the [place] office at [facility].  Please have this done after 08/26/21 Please come back for a follow-up appointment in 1 year.       Fall Prevention in the Home, Adult Falls can cause injuries and can affect people from all age groups. There are many simple things that you can do to make your home safe and to help prevent falls. Ask for help when making these changes, if needed. What actions can I  take to prevent falls? General instructions  Use good lighting in all rooms. Replace any light bulbs that burn out.  Turn on lights if it is dark. Use night-lights.  Place frequently used items in easy-to-reach places. Lower the shelves around your home if necessary.  Set up furniture so that there are clear paths around it. Avoid moving your furniture around.  Remove throw rugs and other tripping hazards from the floor.  Avoid walking on wet floors.  Fix any uneven floor surfaces.  Add color or contrast paint or tape to grab bars and handrails in your home. Place contrasting color strips on the first and last steps of stairways.  When you use a stepladder, make sure that it is completely opened and that the sides are firmly locked. Have someone hold the ladder while you are using it. Do not climb a closed stepladder.  Be aware of any and all pets. What can I do in the bathroom?  Keep the floor dry. Immediately clean up any water that spills onto the floor.  Remove soap buildup in the tub or shower on a regular basis.  Use non-skid mats or decals on the floor of the tub or shower.  Attach bath mats securely with double-sided, non-slip rug tape.  If you need to sit down while you are in the shower, use a plastic, non-slip stool.  Install grab bars by the toilet and in the tub and shower. Do not use towel bars as grab bars.      What can I do in the bedroom?  Make sure that a bedside light is easy to reach.  Do not use oversized bedding that drapes onto the floor.  Have a firm chair that has side arms to use for getting dressed. What can I do in the kitchen?  Clean up any spills right away.  If you need to reach for something above you, use a sturdy step stool that has a grab bar.  Keep electrical cables out of the way.  Do not use floor polish or wax that makes floors slippery. If you must use wax, make sure that it is non-skid floor wax. What can I do in the  stairways?  Do not leave any items on the stairs.  Make sure that you have a light switch at the top of the stairs and the bottom of the stairs. Have them installed if you do not have them.  Make sure that there are handrails on both sides of the stairs. Fix handrails that are broken or loose. Make sure that handrails are as long as the stairways.  Install non-slip stair treads on all stairs in your home.  Avoid having throw rugs at the top or bottom of stairways, or secure the rugs with carpet tape to prevent them from moving.  Choose a carpet design that does not hide the edge of steps on the stairway.  Check any carpeting to make sure that it is firmly attached to the stairs. Fix any carpet that is loose or worn. What can I do on the outside of my home?  Use bright outdoor lighting.  Regularly repair the edges of walkways and driveways and fix any cracks.  Remove high doorway thresholds.  Trim any shrubbery on the main path into your home.  Regularly check that handrails are securely fastened and in good repair. Both sides of any steps should have handrails.  Install guardrails along the edges of any raised decks or porches.  Clear walkways of debris and clutter, including tools and rocks.  Have leaves, snow, and ice cleared regularly.  Use sand or salt on walkways during winter months.  In the garage, clean up any spills right away, including grease or oil spills. What other actions can I take?  Wear closed-toe shoes that fit well and support your feet. Wear shoes that have rubber soles or low heels.  Use mobility aids as needed, such as canes, walkers, scooters, and crutches.  Review your medicines with your health care provider. Some medicines can cause dizziness or changes in blood pressure, which increase your risk of falling. Talk with your health care provider about other ways that you can decrease your risk of falls. This may include working with a physical  therapist or trainer to improve your strength, balance, and endurance. Where to find more information  Centers for Disease Control and Prevention, [employer]: WebmailGuide.co.za  [employer] on Aging: BrainJudge.co.uk Contact a health care provider if:  You are afraid of falling at home.  You feel weak, drowsy, or dizzy at home.  You fall at home. Summary  There are many simple things that you can do to make your home safe and to help prevent falls.  Ways to make your home safe include  removing tripping hazards and installing grab bars in the bathroom.  Ask for help when making these changes in your home. This information is not intended to replace advice given to you by your health care provider. Make sure you discuss any questions you have with your health care provider. Document Revised: 10/28/2017 Document Reviewed: 06/30/2017 Elsevier Patient Education  2021 [employer].

## 2021-04-09 ENCOUNTER — Telehealth: Payer: Self-pay

## 2021-04-09 ENCOUNTER — Telehealth: Payer: Self-pay | Admitting: Endocrinology

## 2021-04-09 LAB — PTH, INTACT AND CALCIUM
Calcium: 9.6 mg/dL (ref 8.6–10.4)
PTH: 17 pg/mL (ref 16–77)

## 2021-04-09 LAB — VITAMIN D 25 HYDROXY (VIT D DEFICIENCY, FRACTURES): VITD: 111.35 ng/mL (ref 30.00–100.00)

## 2021-04-09 LAB — SEDIMENTATION RATE: Sed Rate: 9 mm/hr (ref 0–30)

## 2021-04-09 LAB — CK: Total CK: 45 U/L (ref 7–177)

## 2021-04-09 NOTE — Telephone Encounter (Signed)
Yes, please d/c

## 2021-04-09 NOTE — Telephone Encounter (Addendum)
Called and spoke with pt regarding results. Pt verbalized understanding. ----- Message from Renato Shin, MD sent at 04/09/2021  4:52 PM EDT ----- Aside from the high vitamin-D, normal results.  We'll let you know about the bone density results, when they are available.

## 2021-04-09 NOTE — Telephone Encounter (Signed)
please contact patient: Vitamin-D is really high.  Please d/c this.

## 2021-04-10 NOTE — Telephone Encounter (Signed)
LVM for pt regarding lab results/recommendations  

## 2021-04-10 NOTE — Telephone Encounter (Signed)
Called and left a detailed message advising pt to d/c vitamin D supplements.

## 2021-04-14 ENCOUNTER — Other Ambulatory Visit: Payer: Self-pay

## 2021-04-15 ENCOUNTER — Encounter: Payer: Self-pay | Admitting: Family Medicine

## 2021-04-15 ENCOUNTER — Ambulatory Visit (INDEPENDENT_AMBULATORY_CARE_PROVIDER_SITE_OTHER): Payer: Medicare HMO

## 2021-04-15 ENCOUNTER — Ambulatory Visit: Payer: Medicare HMO

## 2021-04-15 ENCOUNTER — Telehealth: Payer: Self-pay | Admitting: Family Medicine

## 2021-04-15 ENCOUNTER — Ambulatory Visit (INDEPENDENT_AMBULATORY_CARE_PROVIDER_SITE_OTHER): Payer: Medicare HMO | Admitting: Family Medicine

## 2021-04-15 VITALS — BP 100/72 | HR 85 | Temp 98.0°F | Ht 60.0 in | Wt 116.9 lb

## 2021-04-15 DIAGNOSIS — K6389 Other specified diseases of intestine: Secondary | ICD-10-CM | POA: Diagnosis not present

## 2021-04-15 DIAGNOSIS — M810 Age-related osteoporosis without current pathological fracture: Secondary | ICD-10-CM | POA: Diagnosis not present

## 2021-04-15 DIAGNOSIS — M25552 Pain in left hip: Secondary | ICD-10-CM

## 2021-04-15 DIAGNOSIS — M79652 Pain in left thigh: Secondary | ICD-10-CM | POA: Diagnosis not present

## 2021-04-15 DIAGNOSIS — G43809 Other migraine, not intractable, without status migrainosus: Secondary | ICD-10-CM | POA: Diagnosis not present

## 2021-04-15 DIAGNOSIS — M79651 Pain in right thigh: Secondary | ICD-10-CM

## 2021-04-15 DIAGNOSIS — M25551 Pain in right hip: Secondary | ICD-10-CM

## 2021-04-15 DIAGNOSIS — N183 Chronic kidney disease, stage 3 unspecified: Secondary | ICD-10-CM | POA: Diagnosis not present

## 2021-04-15 MED ORDER — ALENDRONATE SODIUM 70 MG PO TABS: ORAL_TABLET | ORAL | 2 refills | Status: DC

## 2021-04-15 MED ORDER — TRAMADOL HCL 50 MG PO TABS: 50.0000 mg | ORAL_TABLET | Freq: Three times a day (TID) | ORAL | 2 refills | Status: DC | PRN

## 2021-04-15 NOTE — Progress Notes (Signed)
Danielle Gaines DOB: 14-Jun-1941 Encounter date: 04/15/2021  This is a 80 y.o. female who presents with Chief Complaint  Patient presents with  . Generalized Body Aches    X3-4 weeks, patient complains of severe bilateral leg pain and difficulty walking    History of present illness: *kidney stones: hasn't heard from urology yet.   *started feeling really bad about a month ago - weak; not able to walk very well. Then started to have issues where she couldn't abduct left leg. This has improved but now hard to abduct right leg. She states that it feels like it is in her bone. Pain is severe - feels down whole femur. Each hip also hurts - just severe aching in bone. Pain into groin was what felt like it was causing pain when she was trying to abduct. She was reading about prolia and it stated that it could cause leg/pelvic pain. Pain is worse with moving.  Of note, she recently saw Dr. Loanne Drilling and had some blood work including a normal sedimentation rate, CK.  Also states that back hurts - lower and sometimes up to mid back.   Also has lump on shoulder that she states is very sensitive. Has appointment with derm. Hurts around this area. Tylenol has not really helped.   Does see Dr. Layne Benton for ortho and has had injections in hips. Last imaging she thinks was over a year ago. Last injection was 3 years ago.   Does have some pain in arms as well - comes and goes. Not like hips.   She has had dark stools in last month. No blood in stools. Last sigmoidoscopy was 02/2020.    Allergies  Allergen Reactions  . Sulfa Antibiotics Swelling and Other (See Comments)    Reaction:  Unspecified swelling reaction    Current Meds  Medication Sig  . acetaminophen (TYLENOL) 500 MG tablet Take 1,000 mg by mouth daily as needed for headache.  . alendronate (FOSAMAX) 70 MG tablet TAKE 1 TABLET(70 MG) BY MOUTH 1 TIME A WEEK WITH A FULL GLASS OF WATER AND ON AN EMPTY STOMACH  . B-D UF III MINI PEN  NEEDLES 31G X 5 MM MISC AS DIRECTED WITH TYMLOS INJECTIONS  . betamethasone valerate ointment (VALISONE) 0.1 % Apply a pea sized amount 2 x a week as needed  . Biotin 1000 MCG tablet Take 1,000 mcg by mouth daily.  . Calcium Carbonate (CALTRATE 600 PO) Take by mouth daily.  . Cholecalciferol (VITAMIN D) 50 MCG (2000 UT) CAPS Take 1 capsule (2,000 Units total) by mouth daily.  . citalopram (CELEXA) 20 MG tablet TK 1 T PO D  . cycloSPORINE (RESTASIS) 0.05 % ophthalmic emulsion Place 1 drop into both eyes 2 (two) times daily.   Marland Kitchen denosumab (PROLIA) 60 MG/ML SOSY injection Inject 60 mg into the skin every 6 (six) months.  . dorzolamide-timolol (COSOPT) 22.3-6.8 MG/ML ophthalmic solution Place 1 drop into both eyes 2 times daily.  . Estradiol 10 MCG TABS vaginal tablet One tablet vaginally daily.  . folic acid (FOLVITE) 1 MG tablet Take by mouth.  . FORTEO 620 MCG/2.48ML SOPN Inject 20 mcg into the skin daily.  . furosemide (LASIX) 40 MG tablet Take 1 tablet (40 mg total) by mouth daily. (from nephrology)  . Galcanezumab-gnlm (EMGALITY) 120 MG/ML SOAJ Inject into the skin.  Marland Kitchen ipratropium (ATROVENT) 0.03 % nasal spray Place 2 sprays into both nostrils 3 (three) times daily.  Marland Kitchen lidocaine (XYLOCAINE) 5 % ointment Apply a  pea sized amount topically 20-30 minutes prior to using the estrogen tablet.  Marland Kitchen LINZESS 290 MCG CAPS capsule Take 290 mcg by mouth daily before breakfast.   . LORazepam (ATIVAN) 0.5 MG tablet Take 0.5 mg by mouth 3 (three) times daily as needed for anxiety.  Marland Kitchen olopatadine (PATANOL) 0.1 % ophthalmic solution 1 drop 2 (two) times daily.  Marland Kitchen omeprazole (PRILOSEC) 40 MG capsule Take 80 mg by mouth daily.   . Probiotic Product (ALIGN PO) Take by mouth daily.  . sodium bicarbonate 650 MG tablet TK 1 T PO BID  . SUMAtriptan (IMITREX) 100 MG tablet Take 100 mg by mouth every 2 (two) hours as needed for migraine.   Marland Kitchen terconazole (TERAZOL 3) 80 MG vaginal suppository Place 1 suppository (80 mg  total) vaginally at bedtime.  . topiramate (TOPAMAX) 100 MG tablet Take 3 tablets at bedtime  . tretinoin (RETIN-A) 0.025 % cream APPLY A PEA SIZED AMOUNT TO FACE ONCE DAILY IN THE EVENING  . UBRELVY 100 MG TABS   . ZENPEP 15000 units CPEP Take 45,000 mg by mouth 2 (two) times daily.    Review of Systems  Constitutional: Positive for activity change (not able to move/walk due to pain) and fatigue. Negative for chills and fever.  Respiratory: Negative for cough, chest tightness, shortness of breath and wheezing.   Cardiovascular: Negative for chest pain, palpitations and leg swelling.  Gastrointestinal: Positive for abdominal pain. Negative for blood in stool, nausea and vomiting.  Genitourinary: Positive for flank pain. Negative for difficulty urinating and dysuria.  Musculoskeletal: Positive for arthralgias, back pain and gait problem. Negative for joint swelling. Myalgias: pain feels more in bones than in muscle.  Skin: Negative for rash.  Neurological: Positive for weakness.  Psychiatric/Behavioral: Positive for sleep disturbance (secondary to pain).    Objective:  BP 100/72 (BP Location: Left Arm, Patient Position: Sitting, Cuff Size: Normal)   Pulse 85   Temp 98 F (36.7 C) (Oral)   Ht 5' (1.524 m)   Wt 116 lb 14.4 oz (53 kg)   SpO2 98%   BMI 22.83 kg/m   Weight: 116 lb 14.4 oz (53 kg)   BP Readings from Last 3 Encounters:  04/15/21 100/72  04/08/21 124/72  09/04/20 122/68   Wt Readings from Last 3 Encounters:  04/15/21 116 lb 14.4 oz (53 kg)  04/08/21 114 lb 12.8 oz (52.1 kg)  09/04/20 117 lb (53.1 kg)    Physical Exam Constitutional:      General: She is not in acute distress.    Appearance: She is well-developed.     Comments: Thin; she is uncomfortable due to pain.   Cardiovascular:     Rate and Rhythm: Normal rate and regular rhythm.     Heart sounds: Normal heart sounds. No murmur heard. No friction rub.  Pulmonary:     Effort: Pulmonary effort is  normal. No respiratory distress.     Breath sounds: Normal breath sounds. No wheezing or rales.  Abdominal:     General: Abdomen is flat. Bowel sounds are normal.     Palpations: Abdomen is soft.     Tenderness: There is abdominal tenderness. There is guarding (lower).     Comments: Significant abdominal tenderness to palpation, mostly noted in the lower abdomen.  Of note, she did have a fall CT abdomen pelvis on recent ER visit that was unrevealing for abnormalities.  Musculoskeletal:     Right lower leg: No edema.     Left lower  leg: No edema.     Comments: She does not have significant pain with hip flexion bilaterally.  She has generalized tenderness in the groin and lateral hips with internal and external rotation.  She has difficulty with standing from seated position and has to adjust to get hips out of a slightly flexed position.  She does not have significant tenderness of the trochanteric bursa or IT bands.  She does have some sacroiliac tenderness with rotation of the hip.  Exam is limited secondary to her discomfort  Neurological:     Mental Status: She is alert and oriented to person, place, and time.  Psychiatric:        Attention and Perception: Attention normal.        Behavior: Behavior normal.     Assessment/Plan  1. Osteoporosis, unspecified osteoporosis type, unspecified pathological fracture presence She follows with endocrinology on a regular basis for this.  She is currently taking Prolia, Fosamax, and Forteo.  Her vitamin D levels were significantly elevated on recent evaluation from endocrinology, and she was advised to hold vitamin D.  Uncertain if this is a trigger for her pain currently, but we will follow her closely to see if symptoms improve with this.  She has been on medications for long period of time potentially could be experiencing pain secondary to side effect of medication.  We will check with endocrinology to see if there is any possibility of holding  medications for short period of time. - alendronate (FOSAMAX) 70 MG tablet; TAKE 1 TABLET(70 MG) BY MOUTH 1 TIME A WEEK WITH A FULL GLASS OF WATER AND ON AN EMPTY STOMACH  Dispense: 12 tablet; Refill: 2  2. Bilateral thigh pain Patient is worried about bone cancer.  I think this is less likely source of pain we discussed that it would be unusual for her to have such symmetric pain.  I do think it is reasonable given the severity of her symptoms to make sure there are no underlying bony abnormalities.  We can grab an x-ray today and follow-up pending these results. - DG FEMUR 1V RIGHT; Future - DG FEMUR 1V LEFT  3. Bilateral hip pain See above.  I did encourage her to follow-up with orthopedics. - DG Hip Unilat W OR W/O Pelvis 2-3 Views Left; Future - DG Hip Unilat W OR W/O Pelvis 2-3 Views Right; Future  4. Other migraine without status migrainosus, not intractable She does follow regularly with neurology.  She is on Topamax and Emgality.  Would not expect pain to come from these medications.  5. Stage 3 chronic kidney disease, unspecified whether stage 3a or 3b CKD ([place]) She does follow regularly with nephrology.  Return for pending xrays. 40 minutes total time spent in chart review, lab review, specialty review, discussion of current medications and potential side effects, discussion of follow-up treatment plan, examination, and charting.   Micheline Rough, MD

## 2021-04-15 NOTE — Telephone Encounter (Signed)
   Danielle Gaines. [name] "[name]"called  she said she has been trying to get on mychart for her visit  11:45 for today  is not able to get on [phone] the patient would like to reschedule. I could not reschedule the patient; it would not let me. pt would like a call to reschedule

## 2021-04-15 NOTE — Patient Instructions (Addendum)
[facility]  referral placed on [facility]  Address: [address], [place], [place] [address] Phone: [phone] Fax [phone] Their office will contact pt to schedule directly and inform our office of appointment   *please call GI and let them know about dark stools.  *Call for follow up with Dr. Layne Benton

## 2021-04-16 ENCOUNTER — Other Ambulatory Visit: Payer: Self-pay | Admitting: Dermatology

## 2021-04-17 ENCOUNTER — Telehealth: Payer: Self-pay | Admitting: Family Medicine

## 2021-04-17 ENCOUNTER — Other Ambulatory Visit: Payer: Self-pay | Admitting: Family Medicine

## 2021-04-17 NOTE — Telephone Encounter (Signed)
Called husband. Reviewed normal xrays with him. Advised holding fosamax (she had forteo on list but was not taking this) with approval from Dr. Loanne Drilling. Next prolia isn't due until September. Hopefully with holding vitamin D as well she will get some improvement. Pain medication is helpful to a degree with keeping her comfortable. Husband will call with update in 2 weeks time; sooner if any worsening.

## 2021-04-17 NOTE — Telephone Encounter (Signed)
Pts spouse is calling in wanting to get xray results from 04/15/2021 spouse is aware that the results has not come in and when they do come in someone in the office will give them a call.  Spouse verbalize understanding.

## 2021-04-20 ENCOUNTER — Telehealth: Payer: Self-pay | Admitting: Family Medicine

## 2021-04-20 NOTE — Telephone Encounter (Signed)
I called him last Friday and we went over all results together? Xrays were all negative. We discussed stopping the vitamin D, fosamax and him updating me in 2 weeks time with progress. Please see if there were other questions?

## 2021-04-20 NOTE — Telephone Encounter (Signed)
Spoke with Danielle Gaines, informed him of the message below and he agreed to return a call in 2 weeks as below.

## 2021-04-20 NOTE — Telephone Encounter (Signed)
Patient husband is calling and wanted to see if results were back, please advise. CB is [phone]

## 2021-05-06 ENCOUNTER — Telehealth: Payer: Self-pay | Admitting: Family Medicine

## 2021-05-06 NOTE — Telephone Encounter (Signed)
Danielle Gaines called to let [name] know that the traMADol (ULTRAM) 50 MG tablet that was prescribed to the patients a couple of weeks ago is helping and he was just calling to give [name] an update on her progress.  If you need to talk to [place] [phone]

## 2021-05-06 NOTE — Telephone Encounter (Signed)
Glad to hear that. Would advise staying off the fosamax another couple of weeks; continue the tramadol as needed. Update with pain at that time.

## 2021-05-07 NOTE — Telephone Encounter (Signed)
Spoke with Danielle Gaines and informed him of the message below.  Danielle Gaines stated Dr Loanne Drilling advised the patient to discontinue vitamin D and he will call back in 2 weeks with an update.

## 2021-05-08 NOTE — Telephone Encounter (Signed)
[name] stated the pt has discontinued vitamin D.

## 2021-05-12 DIAGNOSIS — Z9889 Other specified postprocedural states: Secondary | ICD-10-CM | POA: Diagnosis not present

## 2021-05-12 DIAGNOSIS — H30033 Focal chorioretinal inflammation, peripheral, bilateral: Secondary | ICD-10-CM | POA: Diagnosis not present

## 2021-05-12 DIAGNOSIS — Z961 Presence of intraocular lens: Secondary | ICD-10-CM | POA: Diagnosis not present

## 2021-05-12 DIAGNOSIS — H3581 Retinal edema: Secondary | ICD-10-CM | POA: Diagnosis not present

## 2021-05-12 DIAGNOSIS — H35342 Macular cyst, hole, or pseudohole, left eye: Secondary | ICD-10-CM | POA: Diagnosis not present

## 2021-05-12 DIAGNOSIS — H18529 Epithelial (juvenile) corneal dystrophy, unspecified eye: Secondary | ICD-10-CM | POA: Diagnosis not present

## 2021-05-12 DIAGNOSIS — H35371 Puckering of macula, right eye: Secondary | ICD-10-CM | POA: Diagnosis not present

## 2021-05-12 DIAGNOSIS — H348332 Tributary (branch) retinal vein occlusion, bilateral, stable: Secondary | ICD-10-CM | POA: Diagnosis not present

## 2021-05-21 ENCOUNTER — Other Ambulatory Visit: Payer: Self-pay | Admitting: Dermatology

## 2021-05-21 DIAGNOSIS — M7989 Other specified soft tissue disorders: Secondary | ICD-10-CM

## 2021-05-27 ENCOUNTER — Ambulatory Visit: Payer: Medicare HMO

## 2021-05-27 DIAGNOSIS — D1721 Benign lipomatous neoplasm of skin and subcutaneous tissue of right arm: Secondary | ICD-10-CM | POA: Diagnosis not present

## 2021-06-09 ENCOUNTER — Other Ambulatory Visit: Payer: Medicare HMO

## 2021-06-11 ENCOUNTER — Encounter: Payer: Self-pay | Admitting: Orthopaedic Surgery

## 2021-06-11 ENCOUNTER — Other Ambulatory Visit: Payer: Self-pay

## 2021-06-11 ENCOUNTER — Ambulatory Visit: Payer: Medicare HMO | Admitting: Orthopaedic Surgery

## 2021-06-11 VITALS — Ht 60.0 in | Wt 116.0 lb

## 2021-06-11 DIAGNOSIS — M25551 Pain in right hip: Secondary | ICD-10-CM

## 2021-06-11 DIAGNOSIS — M25552 Pain in left hip: Secondary | ICD-10-CM

## 2021-06-11 NOTE — Progress Notes (Signed)
Office Visit Note   Patient: Danielle Gaines           Date of Birth: 11-26-1941           MRN: [id number] Visit Date: 06/11/2021              Requested by: Caren Macadam, MD [place],  [place] [address] PCP: Caren Macadam, MD   Assessment & Plan: Visit Diagnoses:  1. Bilateral hip pain   2. Hip pain, bilateral     Plan: [name] has been experiencing some bilateral hip pain for the past 4 weeks.  Pain seems originate in the area of both of her groins and then radiates into her thighs.  Occasionally she will have some knee pain.  He has tried Tylenol which gives her some relief.  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  There is some calcification about the capsule bilaterally and may be slight narrowing of the joint space.  I suspect she will have some arthritis.  I like to obtain an MRI scan of her pelvis to include both of her hips for further identification.  I suspect the entire problem is related to her hips and the arthritis with CPPD and she might be a candidate to inject the hips with cortisone  Follow-Up Instructions: Return After MRI scan of pelvis.   Orders:  Orders Placed This Encounter  Procedures   MR Pelvis w/o contrast   No orders of the defined types were placed in this encounter.     Procedures: No procedures performed   Clinical Data: No additional findings.   Subjective: Chief Complaint  Patient presents with   Right Hip - Pain   Left Hip - Pain  Patient presents today for bilateral leg pain. She said that it started about 4 weeks ago. No known injury. Her pain originates in her groin bilaterally and travels down both legs. She said that her "bones hurt so bad". She said that the pain is constant regardless or rest, position, or activity. She has had x-rays taken prior to today. She  is taking Tylenol for pain.  Has a history of low back pain but feels that her present pain is separate from her back  HPI  Review of Systems   Objective: Vital Signs: Ht 5' (1.524 m)   Wt 116 lb (52.6 kg)   BMI 22.65 kg/m   Physical Exam Constitutional:      Appearance: She is well-developed.  Pulmonary:     Effort: Pulmonary effort is normal.  Skin:    General: Skin is warm and dry.  Neurological:     Mental Status: She is alert and oriented to person, place, and time.  Psychiatric:        Behavior: Behavior normal.    Ortho Exam awake alert and comfortable.  Had a short based gait associated with significant pain on internal and external rotation of both hips.  There was some loss of external/internal rotation about 10 degrees bilaterally with some referred pain into both thighs.  Straight leg raise negative.  Did not have any percussible tenderness of the lumbar spine  Specialty Comments:  No specialty comments available.  Imaging: No results found.   PMFS History: Patient Active Problem List   Diagnosis Date Noted   Hip pain, bilateral 06/11/2021   Myalgia 04/08/2021  Referred otalgia of both ears 07/08/2020   Right ear pain 05/09/2020   Bilateral impacted cerumen 09/13/2019   Excessive sweating 06/13/2019   Anterior epistaxis 08/14/2018   Aphthous ulcer 08/14/2018   Dysfunction of both eustachian tubes 08/14/2018   Chronic kidney disease (CKD), stage III (moderate) (HCC) 08/01/2018   Anemia, chronic renal failure 08/01/2018   Episcleritis of left eye 02/23/2018   New daily persistent headache 01/10/2018   Temporal arteritis ([place]) 01/10/2018   Bilateral temporomandibular joint pain 01/10/2018   Constipation 10/18/2017   Sensorineural hearing loss (SNHL), bilateral 09/04/2017   Hearing loss 09/01/2017   Tremor of both hands 09/01/2017   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 Posey Pronto, thought from [place] per renal notes    Chronic pain    managed by pain clinic   Colon abnormality    didn't work right so part of it was removed at [facility]   Depression    managed by Dr. Toy Care   Diastolic heart failure ([place])    mild on echo 2016   GERD (gastroesophageal reflux disease)    IBS (irritable bowel syndrome)    sees Dr. Earlean Shawl   Macular degeneration    goes to [facility] for this   Migraine    managed by neurologist   Osteoporosis    Vertigo     Family History  Problem Relation Age of Onset   Kidney disease Mother    Heart disease Mother    Osteoporosis Mother    Heart Problems Sister        pacemaker   Cancer Maternal Aunt        intestinal and liver cancer   Stroke Maternal Grandmother    Heart disease Maternal Grandmother    Stroke Other    Depression Other         everyone   Hypercalcemia Neg Hx    Breast cancer Neg Hx     Past Surgical History:  Procedure Laterality Date   ABDOMINAL ADHESION SURGERY     ABDOMINAL HYSTERECTOMY     APPENDECTOMY     CHOLECYSTECTOMY     COLON SURGERY     CYSTOSCOPY W/ URETERAL STENT PLACEMENT Right 04/27/2016   Procedure: CYSTOSCOPY WITH RETROGRADE PYELOGRAM/ RIGHT URETERAL STENT PLACEMENT;  Surgeon: Ardis Hughs, MD;  Location: [facility];  Service: Urology;  Laterality:  Right;   OVARIAN CYST SURGERY     SMALL INTESTINE SURGERY     Social History   Occupational History   Not on file  Tobacco Use   Smoking status: Never   Smokeless tobacco: Never  Vaping Use   Vaping Use: Never used  Substance and Sexual Activity   Alcohol use: No   Drug use: No   Sexual activity: Not Currently    Birth control/protection: Post-menopausal

## 2021-06-18 NOTE — Telephone Encounter (Signed)
Pt ready for scheduling on or after 08/09/21  Out-of-pocket cost due at time of visit: $280  Primary: [employer] Prolia co-insurance: 20% ($255) Admin fee co-insurance: 20% ($25)  Secondary: n/a Prolia co-insurance:  Admin fee co-insurance:   Deductible: does not apply  Prior Auth: APPROVED PA# [id number] Valid: 05/16/20-11/28/21

## 2021-06-18 NOTE — Telephone Encounter (Signed)
Prolia VOB initiated via [email or web address]  Last OV: 04/08/21 Next OV: 04/08/22 Last Prolia inj: 02/05/21 Next Prolia inj DUE: 08/09/21

## 2021-06-21 ENCOUNTER — Ambulatory Visit
Admission: RE | Admit: 2021-06-21 | Discharge: 2021-06-21 | Disposition: A | Discharge status: Home / Self Care | Payer: Medicare HMO | Source: Ambulatory Visit | Attending: Orthopaedic Surgery | Admitting: Orthopaedic Surgery

## 2021-06-21 DIAGNOSIS — M25552 Pain in left hip: Secondary | ICD-10-CM | POA: Diagnosis not present

## 2021-06-21 DIAGNOSIS — M545 Low back pain, unspecified: Secondary | ICD-10-CM | POA: Diagnosis not present

## 2021-06-21 DIAGNOSIS — Z9889 Other specified postprocedural states: Secondary | ICD-10-CM | POA: Diagnosis not present

## 2021-06-21 DIAGNOSIS — M25551 Pain in right hip: Secondary | ICD-10-CM

## 2021-06-21 DIAGNOSIS — M24151 Other articular cartilage disorders, right hip: Secondary | ICD-10-CM | POA: Diagnosis not present

## 2021-06-22 ENCOUNTER — Other Ambulatory Visit: Payer: Self-pay | Admitting: General Surgery

## 2021-06-22 DIAGNOSIS — D1721 Benign lipomatous neoplasm of skin and subcutaneous tissue of right arm: Secondary | ICD-10-CM | POA: Diagnosis not present

## 2021-06-22 DIAGNOSIS — D1739 Benign lipomatous neoplasm of skin and subcutaneous tissue of other sites: Secondary | ICD-10-CM | POA: Diagnosis not present

## 2021-06-22 HISTORY — PX: OTHER SURGICAL HISTORY: SHX169

## 2021-06-30 DIAGNOSIS — H3581 Retinal edema: Secondary | ICD-10-CM | POA: Diagnosis not present

## 2021-06-30 DIAGNOSIS — Z961 Presence of intraocular lens: Secondary | ICD-10-CM | POA: Diagnosis not present

## 2021-06-30 DIAGNOSIS — H348332 Tributary (branch) retinal vein occlusion, bilateral, stable: Secondary | ICD-10-CM | POA: Diagnosis not present

## 2021-06-30 DIAGNOSIS — H35371 Puckering of macula, right eye: Secondary | ICD-10-CM | POA: Diagnosis not present

## 2021-06-30 DIAGNOSIS — Z9889 Other specified postprocedural states: Secondary | ICD-10-CM | POA: Diagnosis not present

## 2021-06-30 DIAGNOSIS — H30033 Focal chorioretinal inflammation, peripheral, bilateral: Secondary | ICD-10-CM | POA: Diagnosis not present

## 2021-06-30 DIAGNOSIS — H35342 Macular cyst, hole, or pseudohole, left eye: Secondary | ICD-10-CM | POA: Diagnosis not present

## 2021-06-30 DIAGNOSIS — H18529 Epithelial (juvenile) corneal dystrophy, unspecified eye: Secondary | ICD-10-CM | POA: Diagnosis not present

## 2021-07-01 ENCOUNTER — Encounter: Payer: Self-pay | Admitting: Orthopaedic Surgery

## 2021-07-01 ENCOUNTER — Other Ambulatory Visit: Payer: Self-pay

## 2021-07-01 ENCOUNTER — Ambulatory Visit: Payer: Medicare HMO | Admitting: Orthopaedic Surgery

## 2021-07-01 VITALS — Ht 60.0 in | Wt 116.0 lb

## 2021-07-01 DIAGNOSIS — M25552 Pain in left hip: Secondary | ICD-10-CM

## 2021-07-01 DIAGNOSIS — M25551 Pain in right hip: Secondary | ICD-10-CM | POA: Diagnosis not present

## 2021-07-01 NOTE — Progress Notes (Signed)
Office Visit Note   Patient: Danielle Gaines           Date of Birth: 09/19/1941           MRN: [id number] Visit Date: 07/01/2021              Requested by: Caren Macadam, MD [place],  [place] [address] PCP: Caren Macadam, MD   Assessment & Plan: Visit Diagnoses:  1. Bilateral hip pain   2. Hip pain, bilateral     Plan: Danielle Gaines had an MRI scan of her pelvis demonstrating mild partial-thickness cartilage loss of the hips bilaterally.  No injury or abnormal bone findings.  Some of the pain may be related to the mild osteoarthritis as she is having some groin pain but with the pain in her thighs and her calves at its more likely related to her back.  She has had a problem with her back in the past and is even had cortisone injections through the neurosurgeons office.  I would like to obtain an MRI scan of her lumbar spine before we consider specific injections as I think that is more likely the source of her pain and then her pelvis  Follow-Up Instructions: Return After MRI scan lumbar spine.   Orders:  Orders Placed This Encounter  Procedures   MR Lumbar Spine w/o contrast   No orders of the defined types were placed in this encounter.     Procedures: No procedures performed   Clinical Data: No additional findings.   Subjective: Chief Complaint  Patient presents with   Right Hip - Follow-up    MRI review   Left Hip - Follow-up    MRI review  Patient presents today for a follow up on her bilateral hip pain. She had an MRI and is here today to discuss those results.  No change in symptoms.  Still having some groin pain but does complain of back and buttock discomfort with some referred pain into both lower extremities.  She has had issues with her back in the past and is seeing the neurosurgeon even had injections.  She did have an MRI scan of her thoracic spine in the past that revealed some degenerative changes but no  stenosis.  HPI  Review of Systems   Objective: Vital Signs: Ht 5' (1.524 m)   Wt 116 lb (52.6 kg)   BMI 22.65 kg/m   Physical Exam Constitutional:      Appearance: She is well-developed.  Pulmonary:     Effort: Pulmonary effort is normal.  Skin:    General: Skin is warm and dry.  Neurological:     Mental Status: She is alert and oriented to person, place, and time.  Psychiatric:        Behavior: Behavior normal.    Ortho Exam awake alert and oriented x3.  Comfortable sitting in the chair.  She had a little bit of discomfort with internal and external rotation of right more than left hip.  Straight leg raise negative.  Mild percussible tenderness of the lumbar spine.  No masses.  No pain over the lateral aspect of either hip.  Specialty Comments:  No specialty comments available.  Imaging: No results found.   PMFS History: Patient Active Problem List   Diagnosis Date Noted   Hip pain, bilateral 06/11/2021   Myalgia 04/08/2021   Referred otalgia of both ears 07/08/2020   Right ear pain 05/09/2020   Bilateral impacted cerumen  09/13/2019   Excessive sweating 06/13/2019   Anterior epistaxis 08/14/2018   Aphthous ulcer 08/14/2018   Dysfunction of both eustachian tubes 08/14/2018   Chronic kidney disease (CKD), stage III (moderate) (HCC) 08/01/2018   Anemia, chronic renal failure 08/01/2018   Episcleritis of left eye 02/23/2018   New daily persistent headache 01/10/2018   Temporal arteritis ([place]) 01/10/2018   Bilateral temporomandibular joint pain 01/10/2018   Constipation 10/18/2017   Sensorineural hearing loss (SNHL), bilateral 09/04/2017   Hearing loss 09/01/2017   Tremor of both hands 09/01/2017   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 Posey Pronto, thought from [place] per renal notes    Chronic pain    managed by pain clinic   Colon abnormality    didn't work right so part of it was removed at [facility]   Depression    managed by Dr. Toy Care   Diastolic heart failure ([place])    mild on echo 2016   GERD (gastroesophageal reflux disease)    IBS (irritable bowel syndrome)    sees Dr. Earlean Shawl   Macular degeneration    goes to [facility] for this   Migraine    managed by neurologist   Osteoporosis    Vertigo     Family History  Problem Relation Age of Onset   Kidney disease Mother    Heart disease Mother    Osteoporosis Mother    Heart Problems Sister        pacemaker   Cancer Maternal Aunt        intestinal and liver cancer   Stroke Maternal Grandmother    Heart disease Maternal Grandmother    Stroke Other    Depression Other        everyone   Hypercalcemia Neg Hx    Breast cancer Neg Hx     Past Surgical History:  Procedure Laterality Date   ABDOMINAL ADHESION SURGERY      ABDOMINAL HYSTERECTOMY     APPENDECTOMY     CHOLECYSTECTOMY     COLON SURGERY     CYSTOSCOPY W/ URETERAL STENT PLACEMENT Right 04/27/2016   Procedure: CYSTOSCOPY WITH RETROGRADE PYELOGRAM/ RIGHT URETERAL STENT PLACEMENT;  Surgeon: Ardis Hughs, MD;  [facility];  Service: Urology;  Laterality: Right;   OVARIAN CYST SURGERY     SMALL INTESTINE SURGERY  Social History   Occupational History   Not on file  Tobacco Use   Smoking status: Never   Smokeless tobacco: Never  Vaping Use   Vaping Use: Never used  Substance and Sexual Activity   Alcohol use: No   Drug use: No   Sexual activity: Not Currently    Birth control/protection: Post-menopausal

## 2021-07-03 NOTE — Telephone Encounter (Signed)
Called to get Prolia scheduled - was advised that patient wants to wait until after MRI is done and that patient will call us when ready to schedule.

## 2021-07-07 ENCOUNTER — Other Ambulatory Visit: Payer: Medicare HMO

## 2021-07-08 ENCOUNTER — Telehealth: Payer: Self-pay | Admitting: Family Medicine

## 2021-07-08 NOTE — Telephone Encounter (Signed)
PT spouse called to advise that they need Dr.Koberlein to called the breast center and tell them that it is okay for them to do the 3D mammogram.

## 2021-07-08 NOTE — Telephone Encounter (Signed)
She is due for repeat screening mammogram in October. Last year she had 3d bilat screening. [place] for this again. Typically they don't need order for this, but ok to place if needed.

## 2021-07-09 ENCOUNTER — Ambulatory Visit
Admission: RE | Admit: 2021-07-09 | Discharge: 2021-07-09 | Disposition: A | Discharge status: Home / Self Care | Payer: Medicare HMO | Source: Ambulatory Visit | Attending: Orthopaedic Surgery | Admitting: Orthopaedic Surgery

## 2021-07-09 DIAGNOSIS — M25551 Pain in right hip: Secondary | ICD-10-CM

## 2021-07-09 DIAGNOSIS — M5126 Other intervertebral disc displacement, lumbar region: Secondary | ICD-10-CM | POA: Diagnosis not present

## 2021-07-09 DIAGNOSIS — M47816 Spondylosis without myelopathy or radiculopathy, lumbar region: Secondary | ICD-10-CM | POA: Diagnosis not present

## 2021-07-09 NOTE — Telephone Encounter (Signed)
Spoke with Arkinia at the [facility] and informed her of the message below.  Arkinia stated the patient can schedule a 3-D mammogram and an order is not required unless the patient is having pain and other tests are needed.  I spoke with Mr Quilty, informed him of this and he stated the patient is not having any pain and he will call to schedule the appt.

## 2021-07-10 DIAGNOSIS — L718 Other rosacea: Secondary | ICD-10-CM | POA: Diagnosis not present

## 2021-07-10 DIAGNOSIS — Z9889 Other specified postprocedural states: Secondary | ICD-10-CM | POA: Diagnosis not present

## 2021-07-10 DIAGNOSIS — H0288A Meibomian gland dysfunction right eye, upper and lower eyelids: Secondary | ICD-10-CM | POA: Diagnosis not present

## 2021-07-10 DIAGNOSIS — Z961 Presence of intraocular lens: Secondary | ICD-10-CM | POA: Diagnosis not present

## 2021-07-10 DIAGNOSIS — H0288B Meibomian gland dysfunction left eye, upper and lower eyelids: Secondary | ICD-10-CM | POA: Diagnosis not present

## 2021-07-10 DIAGNOSIS — H30033 Focal chorioretinal inflammation, peripheral, bilateral: Secondary | ICD-10-CM | POA: Diagnosis not present

## 2021-07-13 NOTE — Progress Notes (Addendum)
Co sign required

## 2021-07-14 ENCOUNTER — Other Ambulatory Visit: Payer: Self-pay | Admitting: Family Medicine

## 2021-07-14 ENCOUNTER — Ambulatory Visit: Payer: Medicare HMO

## 2021-07-14 DIAGNOSIS — Z1231 Encounter for screening mammogram for malignant neoplasm of breast: Secondary | ICD-10-CM

## 2021-07-15 ENCOUNTER — Ambulatory Visit: Payer: Medicare HMO

## 2021-07-15 ENCOUNTER — Other Ambulatory Visit: Payer: Self-pay

## 2021-07-15 DIAGNOSIS — M810 Age-related osteoporosis without current pathological fracture: Secondary | ICD-10-CM

## 2021-07-15 MED ORDER — DENOSUMAB 60 MG/ML ~~LOC~~ SOSY
60.0000 mg | PREFILLED_SYRINGE | Freq: Once | SUBCUTANEOUS | Status: AC
  Administered 2021-07-15: 60 mg via SUBCUTANEOUS

## 2021-07-15 NOTE — Progress Notes (Signed)
Prolia injection administered to pt's left arm. Pt tolerated well. °

## 2021-07-16 ENCOUNTER — Encounter: Payer: Self-pay | Admitting: Orthopaedic Surgery

## 2021-07-16 ENCOUNTER — Ambulatory Visit: Payer: Medicare HMO | Admitting: Orthopaedic Surgery

## 2021-07-16 VITALS — Ht 60.0 in | Wt 116.0 lb

## 2021-07-16 DIAGNOSIS — M25552 Pain in left hip: Secondary | ICD-10-CM

## 2021-07-16 DIAGNOSIS — G8929 Other chronic pain: Secondary | ICD-10-CM

## 2021-07-16 DIAGNOSIS — M25551 Pain in right hip: Secondary | ICD-10-CM | POA: Diagnosis not present

## 2021-07-16 DIAGNOSIS — M545 Low back pain, unspecified: Secondary | ICD-10-CM | POA: Insufficient documentation

## 2021-07-16 NOTE — Progress Notes (Signed)
Office Visit Note   Patient: Danielle Gaines           Date of Birth: [birth year]           MRN: [id number] Visit Date: 07/16/2021              Requested by: Caren Macadam, MD [place],  [place] [address] PCP: Caren Macadam, MD   Assessment & Plan: Visit Diagnoses:  1. Hip pain, bilateral   2. Chronic bilateral low back pain without sciatica     Plan: Mrs. Altemus is had an MRI scan of her pelvis demonstrating mild partial-thickness cartilage loss both hips.  There was no acute injury to her pelvis.  I also performed an MRI scan of her lumbar spine demonstrating mild degenerative changes with mild bilateral neuroforaminal narrowing at L1-2 and L2-3 but no significant spinal canal stenosis at any level.  This would not explain any pain she is having in her lower extremities.  She does have some pain in her right groin with internal and external rotation I believe that is related to her arthritis.  Long discussion with Mr. Mrs. Mckercher regarding all of the above.  I think a course of physical therapy at [facility] facility at [facility] for her back would be helpful.  I will also asked Dr. Ernestina Patches to consider cortisone injection of her right hip related to the arthritis.  She does have history of osteoporosis and is on Prolia per her endocrinologist.  Her endocrinologist is also diagnosed fibromyalgia.  I think she does have restless leg and it is worth asking her endocrinologist to consider treatment for the restless leg and the fibromyalgia as I cannot ascribe this to her groin problem or her back  Follow-Up Instructions: Return if symptoms worsen or fail to improve.   Orders:  Orders Placed This Encounter  Procedures   Ambulatory referral to Physical Medicine Rehab   Ambulatory referral to Physical Therapy   No orders of the defined types were placed in this encounter.     Procedures: No procedures performed   Clinical Data: No additional  findings.   Subjective: Chief Complaint  Patient presents with   Lower Back - Follow-up    MRI review  Patient presents today for follow up on her lower back. She had an MRI and is here today to go over those results.  No change in her symptoms.  She has mostly bilateral leg pain at night and has a hard time getting her legs comfortable and "still".  She has been seen by an endocrinologist with a diagnosis of osteoporosis and is taking Prolia.  She is also been diagnosed with fibromyalgia  HPI  Review of Systems   Objective: Vital Signs: Ht 5' (1.524 m)   Wt 116 lb (52.6 kg)   BMI 22.65 kg/m   Physical Exam Constitutional:      Appearance: She is well-developed.  Pulmonary:     Effort: Pulmonary effort is normal.  Skin:    General: Skin is warm and dry.  Neurological:     Mental Status: She is alert and oriented to person, place, and time.  Psychiatric:        Behavior: Behavior normal.    Ortho Exam percussible tenderness of lumbar spine at the lumbosacral junction.  No pain over either SI joint.  Straight leg raise negative.  No lower extremity edema.  Neurologically intact.  Does have little bit of pain with internal and  external rotation of her right hip but not the left.  Specialty Comments:  No specialty comments available.  Imaging: No results found.   PMFS History: Patient Active Problem List   Diagnosis Date Noted   Low back pain 07/16/2021   Hip pain, bilateral 06/11/2021   Myalgia 04/08/2021   Referred otalgia of both ears 07/08/2020   Right ear pain 05/09/2020   Bilateral impacted cerumen 09/13/2019   Excessive sweating 06/13/2019   Anterior epistaxis 08/14/2018   Aphthous ulcer 08/14/2018   Dysfunction of both eustachian tubes 08/14/2018   Chronic kidney disease (CKD), stage III (moderate) (HCC) 08/01/2018   Anemia, chronic renal failure 08/01/2018   Episcleritis of left eye 02/23/2018   New daily persistent headache 01/10/2018   Temporal  arteritis (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 Posey Pronto, thought from [place] per renal notes    Chronic pain    managed by pain clinic   Colon abnormality    didn't work right so part of it was removed at [facility]   Depression    managed by Dr. Toy Care   Diastolic heart failure ([place])    mild on echo 2016   GERD (gastroesophageal reflux disease)    IBS (irritable bowel syndrome)    sees Dr. Earlean Shawl   Macular degeneration    goes to [facility] for this   Migraine    managed by neurologist   Osteoporosis    Vertigo      Family History  Problem Relation Age of Onset   Kidney disease Mother    Heart disease Mother    Osteoporosis Mother    Heart Problems Sister        pacemaker   Cancer Maternal Aunt        intestinal and liver cancer   Stroke Maternal Grandmother    Heart disease Maternal Grandmother    Stroke Other    Depression Other        everyone   Hypercalcemia Neg Hx    Breast cancer Neg Hx     Past Surgical History:  Procedure Laterality Date   ABDOMINAL ADHESION SURGERY     ABDOMINAL  HYSTERECTOMY     APPENDECTOMY     CHOLECYSTECTOMY     COLON SURGERY     CYSTOSCOPY W/ URETERAL STENT PLACEMENT Right 04/27/2016   Procedure: CYSTOSCOPY WITH RETROGRADE PYELOGRAM/ RIGHT URETERAL STENT PLACEMENT;  Surgeon: Ardis Hughs, MD;  Location: [facility];  Service: Urology;  Laterality: Right;   OVARIAN CYST SURGERY     SMALL INTESTINE SURGERY     Social History   Occupational History   Not on file  Tobacco Use   Smoking status: Never   Smokeless tobacco: Never  Vaping Use   Vaping Use: Never used  Substance and Sexual Activity   Alcohol use: No   Drug use: No   Sexual activity: Not Currently    Birth control/protection: Post-menopausal

## 2021-07-29 ENCOUNTER — Other Ambulatory Visit: Payer: Self-pay

## 2021-07-29 ENCOUNTER — Ambulatory Visit: Payer: Self-pay

## 2021-07-29 ENCOUNTER — Encounter: Payer: Self-pay | Admitting: Physical Medicine and Rehabilitation

## 2021-07-29 ENCOUNTER — Ambulatory Visit (INDEPENDENT_AMBULATORY_CARE_PROVIDER_SITE_OTHER): Payer: Medicare HMO | Admitting: Physical Medicine and Rehabilitation

## 2021-07-29 DIAGNOSIS — M25551 Pain in right hip: Secondary | ICD-10-CM

## 2021-07-29 NOTE — Progress Notes (Signed)
Pt state right hip pain. Pt state walking and getting up from an sitting position makes the pain worse. Pt state she takes over the counter pain meds and uses heating to help ease her pain.  Numeric Pain Rating Scale and Functional Assessment Average Pain 7   In the last MONTH (on 0-10 scale) has pain interfered with the following?  1. General activity like being  able to carry out your everyday physical activities such as walking, climbing stairs, carrying groceries, or moving a chair?  Rating(9)    -BT, -Dye Allergies.

## 2021-07-29 NOTE — Progress Notes (Signed)
Danielle Gaines - 80 y.o. female MRN [id number]  Date of birth: 1941/06/14  Office Visit Note: Visit Date: 07/29/2021 PCP: Caren Macadam, MD Referred by: Caren Macadam, MD  Subjective: Chief Complaint  Patient presents with   Right Hip - Pain   HPI:  Danielle Gaines is a 80 y.o. female who comes in today at the request of Dr. Joni Fears for planned Right anesthetic hip arthrogram with fluoroscopic guidance.  The patient has failed conservative care including home exercise, medications, time and activity modification.  This injection will be diagnostic and hopefully therapeutic.  Please see requesting physician notes for further details and justification.   ROS Otherwise per HPI.  Assessment & Plan: Visit Diagnoses:    ICD-10-CM   1. Pain in right hip  M25.551 XR C-ARM NO REPORT    Large Joint Inj: R hip joint      Plan: No additional findings.   Meds & Orders: No orders of the defined types were placed in this encounter.   Orders Placed This Encounter  Procedures   Large Joint Inj: R hip joint   XR C-ARM NO REPORT    Follow-up: Return for visit to requesting physician as needed.   Procedures: Large Joint Inj: R hip joint on 07/29/2021 3:25 PM Indications: diagnostic evaluation and pain Details: 22 G 3.5 in needle, fluoroscopy-guided anterior approach  Arthrogram: No  Medications: 4 mL bupivacaine 0.25 %; 60 mg triamcinolone acetonide 40 MG/ML Outcome: tolerated well, no immediate complications  There was excellent flow of contrast producing a partial arthrogram of the hip. The patient did have relief of symptoms during the anesthetic phase of the injection. Procedure, treatment alternatives, risks and benefits explained, specific risks discussed. Consent was given by the patient. Immediately prior to procedure a time out was called to verify the correct patient, procedure, equipment, support staff and site/side marked as required. Patient  was prepped and draped in the usual sterile fashion.         Clinical History: MRI PELVIS WITHOUT CONTRAST   TECHNIQUE: Multiplanar multisequence MR imaging of the pelvis was performed. No intravenous contrast was administered.   COMPARISON:  03/27/2021   FINDINGS: Bones:   No hip fracture, dislocation or avascular necrosis.   No periosteal reaction or bone destruction. No aggressive osseous lesion.   Normal sacrum and sacroiliac joints. No SI joint widening or erosive changes.   Visualized lower lumbar spine demonstrates no focal abnormality.   Articular cartilage and labrum   Articular cartilage: Mild partial-thickness cartilage loss of the hips bilaterally.   Labrum: Grossly intact, but evaluation is limited by lack of intraarticular fluid.   Joint or bursal effusion   Joint effusion:  No hip joint effusion.  No SI joint effusion.   Bursae:  No bursa formation.   Muscles and tendons   Flexors: Normal.   Extensors: Normal.   Abductors: Normal.   Adductors: Normal.   Gluteals: Normal.   Hamstrings: Normal.   Other findings   No pelvic free fluid. No fluid collection or hematoma. No inguinal lymphadenopathy. No inguinal hernia. Postsurgical changes partially visualized from an enterocolonic anastomosis better characterized on recent CT abdomen/pelvis dated 03/27/2021.   IMPRESSION: 1. No acute injury of the pelvis. 2. Mild partial-thickness cartilage loss of bilateral hips.     Electronically Signed By: Kathreen Devoid On: 06/22/2021 11:14  Lumbar MRI IMPRESSION: Mild degenerative changes of the lumbar spine with mild bilateral neural foraminal narrowing at L1-2 and L2-3.  No significant spinal canal stenosis at any level.     Electronically Signed   By: [name] Earls M.D.   On: 07/10/2021 14:54     Objective:  VS:  HT:    WT:   BMI:     BP:   HR: bpm  TEMP: ( )  RESP:  Physical Exam   Imaging: No results  found.

## 2021-08-05 DIAGNOSIS — H30033 Focal chorioretinal inflammation, peripheral, bilateral: Secondary | ICD-10-CM | POA: Diagnosis not present

## 2021-08-05 DIAGNOSIS — Z961 Presence of intraocular lens: Secondary | ICD-10-CM | POA: Diagnosis not present

## 2021-08-05 DIAGNOSIS — H0288B Meibomian gland dysfunction left eye, upper and lower eyelids: Secondary | ICD-10-CM | POA: Diagnosis not present

## 2021-08-05 DIAGNOSIS — L718 Other rosacea: Secondary | ICD-10-CM | POA: Diagnosis not present

## 2021-08-05 DIAGNOSIS — Z9889 Other specified postprocedural states: Secondary | ICD-10-CM | POA: Diagnosis not present

## 2021-08-05 DIAGNOSIS — H0288A Meibomian gland dysfunction right eye, upper and lower eyelids: Secondary | ICD-10-CM | POA: Diagnosis not present

## 2021-08-11 DIAGNOSIS — J3489 Other specified disorders of nose and nasal sinuses: Secondary | ICD-10-CM | POA: Diagnosis not present

## 2021-08-11 DIAGNOSIS — J343 Hypertrophy of nasal turbinates: Secondary | ICD-10-CM | POA: Diagnosis not present

## 2021-08-11 DIAGNOSIS — R040 Epistaxis: Secondary | ICD-10-CM | POA: Diagnosis not present

## 2021-08-11 DIAGNOSIS — J300 Vasomotor rhinitis: Secondary | ICD-10-CM | POA: Diagnosis not present

## 2021-08-14 DIAGNOSIS — N1831 Chronic kidney disease, stage 3a: Secondary | ICD-10-CM | POA: Diagnosis not present

## 2021-08-15 MED ORDER — BUPIVACAINE HCL 0.25 % IJ SOLN
4.0000 mL | INTRAMUSCULAR | Status: AC | PRN
  Administered 2021-07-29: 4 mL via INTRA_ARTICULAR

## 2021-08-15 MED ORDER — TRIAMCINOLONE ACETONIDE 40 MG/ML IJ SUSP
60.0000 mg | INTRAMUSCULAR | Status: AC | PRN
Start: 2021-07-29 — End: 2021-07-29
  Administered 2021-07-29: 60 mg via INTRA_ARTICULAR

## 2021-08-18 ENCOUNTER — Ambulatory Visit: Payer: Medicare HMO | Admitting: Endocrinology

## 2021-08-18 NOTE — Telephone Encounter (Signed)
Pt received last Prolia inj too early, was not due until 08/09/21.  Pt received Prolia inj 07/15/21. Next inj due 01/16/22.

## 2021-08-19 DIAGNOSIS — D631 Anemia in chronic kidney disease: Secondary | ICD-10-CM | POA: Diagnosis not present

## 2021-08-19 DIAGNOSIS — N1832 Chronic kidney disease, stage 3b: Secondary | ICD-10-CM | POA: Diagnosis not present

## 2021-08-19 DIAGNOSIS — N2581 Secondary hyperparathyroidism of renal origin: Secondary | ICD-10-CM | POA: Diagnosis not present

## 2021-08-24 DIAGNOSIS — R1084 Generalized abdominal pain: Secondary | ICD-10-CM | POA: Diagnosis not present

## 2021-08-24 DIAGNOSIS — R8279 Other abnormal findings on microbiological examination of urine: Secondary | ICD-10-CM | POA: Diagnosis not present

## 2021-08-24 DIAGNOSIS — N200 Calculus of kidney: Secondary | ICD-10-CM | POA: Diagnosis not present

## 2021-08-25 DIAGNOSIS — Z9889 Other specified postprocedural states: Secondary | ICD-10-CM | POA: Diagnosis not present

## 2021-08-25 DIAGNOSIS — H348332 Tributary (branch) retinal vein occlusion, bilateral, stable: Secondary | ICD-10-CM | POA: Diagnosis not present

## 2021-08-25 DIAGNOSIS — H35342 Macular cyst, hole, or pseudohole, left eye: Secondary | ICD-10-CM | POA: Diagnosis not present

## 2021-08-25 DIAGNOSIS — Z961 Presence of intraocular lens: Secondary | ICD-10-CM | POA: Diagnosis not present

## 2021-08-25 DIAGNOSIS — H18529 Epithelial (juvenile) corneal dystrophy, unspecified eye: Secondary | ICD-10-CM | POA: Diagnosis not present

## 2021-08-25 DIAGNOSIS — H35371 Puckering of macula, right eye: Secondary | ICD-10-CM | POA: Diagnosis not present

## 2021-08-25 DIAGNOSIS — H30033 Focal chorioretinal inflammation, peripheral, bilateral: Secondary | ICD-10-CM | POA: Diagnosis not present

## 2021-08-27 ENCOUNTER — Other Ambulatory Visit: Payer: Self-pay | Admitting: Urology

## 2021-08-27 DIAGNOSIS — N200 Calculus of kidney: Secondary | ICD-10-CM

## 2021-08-28 NOTE — Progress Notes (Signed)
Talked with patient. Hx and meds reviewed. Arrival time 0800. Cl. Liquids until 0600. Husband is the driver. Encouraged to wear mask over the weekend

## 2021-08-31 ENCOUNTER — Ambulatory Visit (HOSPITAL_BASED_OUTPATIENT_CLINIC_OR_DEPARTMENT_OTHER)
Admission: RE | Admit: 2021-08-31 | Discharge: 2021-08-31 | Disposition: A | Discharge status: Home / Self Care | Payer: Medicare HMO | Source: Ambulatory Visit | Attending: Urology | Admitting: Urology

## 2021-08-31 ENCOUNTER — Encounter (HOSPITAL_BASED_OUTPATIENT_CLINIC_OR_DEPARTMENT_OTHER)
Admission: RE | Disposition: A | Discharge status: Home / Self Care | Payer: Self-pay | Source: Ambulatory Visit | Attending: Urology

## 2021-08-31 ENCOUNTER — Ambulatory Visit: Payer: Medicare HMO | Admitting: Endocrinology

## 2021-08-31 ENCOUNTER — Other Ambulatory Visit: Payer: Self-pay

## 2021-08-31 ENCOUNTER — Ambulatory Visit (HOSPITAL_COMMUNITY): Payer: Medicare HMO

## 2021-08-31 ENCOUNTER — Encounter (HOSPITAL_BASED_OUTPATIENT_CLINIC_OR_DEPARTMENT_OTHER): Payer: Self-pay | Admitting: Urology

## 2021-08-31 DIAGNOSIS — Z9049 Acquired absence of other specified parts of digestive tract: Secondary | ICD-10-CM | POA: Diagnosis not present

## 2021-08-31 DIAGNOSIS — Z882 Allergy status to sulfonamides status: Secondary | ICD-10-CM | POA: Insufficient documentation

## 2021-08-31 DIAGNOSIS — Z79899 Other long term (current) drug therapy: Secondary | ICD-10-CM | POA: Insufficient documentation

## 2021-08-31 DIAGNOSIS — N200 Calculus of kidney: Secondary | ICD-10-CM | POA: Diagnosis not present

## 2021-08-31 HISTORY — PX: EXTRACORPOREAL SHOCK WAVE LITHOTRIPSY: SHX1557

## 2021-08-31 SURGERY — LITHOTRIPSY, ESWL
Anesthesia: LOCAL | Laterality: Left

## 2021-08-31 MED ORDER — SODIUM CHLORIDE 0.9 % IV SOLN: INTRAVENOUS | Status: DC

## 2021-08-31 MED ORDER — DIPHENHYDRAMINE HCL 25 MG PO CAPS
ORAL_CAPSULE | ORAL | Status: AC
  Filled 2021-08-31: qty 1

## 2021-08-31 MED ORDER — DIAZEPAM 5 MG PO TABS
ORAL_TABLET | ORAL | Status: AC
  Filled 2021-08-31: qty 2

## 2021-08-31 MED ORDER — CIPROFLOXACIN HCL 500 MG PO TABS
ORAL_TABLET | ORAL | Status: AC
  Filled 2021-08-31: qty 1

## 2021-08-31 MED ORDER — DIPHENHYDRAMINE HCL 25 MG PO CAPS
25.0000 mg | ORAL_CAPSULE | ORAL | Status: AC
  Administered 2021-08-31: 25 mg via ORAL

## 2021-08-31 MED ORDER — TAMSULOSIN HCL 0.4 MG PO CAPS: 0.4000 mg | ORAL_CAPSULE | Freq: Every day | ORAL | 0 refills | Status: DC

## 2021-08-31 MED ORDER — DIAZEPAM 5 MG PO TABS: 10.0000 mg | ORAL_TABLET | ORAL | Status: DC

## 2021-08-31 MED ORDER — CIPROFLOXACIN HCL 500 MG PO TABS
500.0000 mg | ORAL_TABLET | ORAL | Status: AC
  Administered 2021-08-31: 500 mg via ORAL

## 2021-08-31 MED ORDER — TRAMADOL HCL 50 MG PO TABS: 50.0000 mg | ORAL_TABLET | Freq: Four times a day (QID) | ORAL | 0 refills | Status: DC | PRN

## 2021-08-31 NOTE — Discharge Instructions (Signed)
See [facility] discharge instructions in chart.  

## 2021-08-31 NOTE — Op Note (Signed)
See [facility] OP note scanned into chart. Also because of the size, density, location and other factors that cannot be anticipated I feel this will likely be a staged procedure. This fact supersedes any indication in the scanned [facility] operative note to the contrary.  

## 2021-08-31 NOTE — H&P (Signed)
I have kidney stones.  HPI: Danielle Gaines is a 80 year-old female patient who was referred by Dr. Lucretia Kern, MD who is here for renal calculi.    Danielle Gaines is a 80 yo female who had a recent episode of right flank pain that has persisted for the last several months and had a CT in 4/22 that showed a 60mm calcification in the area of the right proximal ureter. There is no obstruction and on review of a KUB from 2020 and a CT from 2017 the calcification is stable and most consistent with a phlebolith. She had right RTG with stent by Dr. Louis Meckel in 2017 and the calcification was not in the ureter. There is a LLP stone that was 6 mm in 2017 and is now 45mm. It is non-obstructing. Her pain has been present for 2 months and it is variable but constant. The pain is in the right lower quadrant/groin area. She can have low back pain as well and her pain is aggravated by standing or prolonged sitting. She can have some pain on the left but it is not as bad. She has some fullness in the midabdominal area. 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 She has had no hematuria. She drinks 80-84oz of water daily but a low urine volume. She has loose frequent BM's. She is on topomax for her migraines.      ALLERGIES: No Allergies Sulfa Drugs    MEDICATIONS: Omeprazole 40 mg capsule,delayed release  Cosopt  Estradiol  Folic Acid  Imitrex 106 mg tablet  Lasix 40 mg tablet  Lasix 40 mg tablet  Lidocaine 5 % ointment  Linzess  Patanol  Prolia 60 mg/ml syringe  Restasis Multidose 0.05 % drops  Retin-A  Sodium Bicarbonate  Topamax 100 mg tablet  Tylenol  Ubrelvy 100 mg tablet  Zenpep 15,000 unit-47,000 unit-63,000 unit capsule,delayed release     GU PSH: Cysto Remove Stent FB Sim - 2017 Cystoscopy Insert Stent - 2017 Hysterectomy Unilat SO - 2017       PSH Notes: Cystoscopy With Insertion Of Ureteral Stent Right  , Cholecystotomy  , Colon Surgery with subtotal  colectomy  , Appendectomy,  Ovarian Surgery,  Total Abdominal Hysterectomy   NON-GU PSH: Appendectomy - 2017 Cholecystectomy (open) - 2007 Hysterectomy - 1972 Incise Gallbladder - 2017     GU PMH: Flank Pain - 2017 Renal calculus (Stable) - 2017, Nephrolithiasis, - 2017 Oth hydronephrosis - 2017      PMH Notes: Kidney failure   NON-GU PMH: Anxiety, Anxiety and depression - 2017 Encounter for general adult medical examination without abnormal findings, Encounter for preventive health examination - 2017 Personal history of other diseases of the circulatory system, History of cardiac murmur - 2017 Personal history of other diseases of the musculoskeletal system and connective tissue, History of arthritis - 2017 Personal history of other specified conditions, History of heartburn - 2017 Arthritis Depression GERD    FAMILY HISTORY: 1 son - Other Cancer - Runs in Family cardiac disorder - Runs In Family Death In The Family Mother - Other heart - Mother kidney - Mother No Significant Family History - Runs in Family   SOCIAL HISTORY: Marital Status: Married Preferred Language: English; Ethnicity: Not Hispanic Or Latino; Race: White Current Smoking Status: Patient has never smoked.  Does not use smokeless tobacco. Does not use drugs. Does not drink caffeine. Has not had a blood transfusion.     Notes: Married, Alcohol  use, Number of children, Never a smoker, Caffeine use   REVIEW OF SYSTEMS:    GU Review Female:   Patient reports trouble starting your stream and have to strain to urinate. Patient denies frequent urination, hard to postpone urination, burning /pain with urination, get up at night to urinate, leakage of urine, stream starts and stops, and being pregnant.  Gastrointestinal (Upper):   Patient reports nausea and indigestion/ heartburn. Patient denies vomiting.  Gastrointestinal (Lower):   Patient reports constipation. Patient denies diarrhea.  Constitutional:    Patient reports fever and fatigue. Patient denies night sweats and weight loss.  Skin:   Patient reports itching. Patient denies skin rash/ lesion.  Eyes:   Patient reports blurred vision. Patient denies double vision.  Ears/ Nose/ Throat:   Patient reports sinus problems. Patient denies sore throat.  Hematologic/Lymphatic:   Patient reports easy bruising. Patient denies swollen glands.  Cardiovascular:   Patient reports chest pains. Patient denies leg swelling.  Respiratory:   Patient reports cough and shortness of breath.   Endocrine:   Patient reports excessive thirst.   Musculoskeletal:   Patient reports back pain and joint pain.   Neurological:   Patient reports headaches and dizziness.   Psychologic:   Patient reports depression. Patient denies anxiety.   Notes: Weak stream    VITAL SIGNS:      08/24/2021 02:22 PM  Weight 119 lb / 53.98 kg  Height 61 in / 154.94 cm  BP 118/65 mmHg  Pulse 65 /min  Temperature 98.0 F / 36.6 C  BMI 22.5 kg/m   MULTI-SYSTEM PHYSICAL EXAMINATION:    Constitutional: Well-nourished. No physical deformities. Normally developed. Good grooming.  Neck: Neck symmetrical, not swollen. Normal tracheal position.  Respiratory: Normal breath sounds. No labored breathing, no use of accessory muscles.   Cardiovascular: Regular rate and rhythm. No murmur, no gallop.   Skin: No paleness, no jaundice, no cyanosis. No lesion, no ulcer, no rash.  Neurologic / Psychiatric: Oriented to time, oriented to place, oriented to person. No depression, no anxiety, no agitation.  Gastrointestinal: Abdominal tenderness generalized and mild with voluntary guarding. No mass, no rigidity, non obese abdomen.   Musculoskeletal: Normal gait and station of head and neck. some tenderness of the paraspinous muscles.      Complexity of Data:  Records Review:   Previous Patient Records  Urine Test Review:   Urinalysis  X-Ray Review: KUB: Reviewed Films. Reviewed Report. Discussed With  Patient.  C.T. Abdomen/Pelvis: Reviewed Films. Reviewed Report. Discussed With Patient.     PROCEDURES:         KUB - [id number]  A single view of the abdomen is obtained. The LLP stone now measures 24mm and the RUQ calcification is 42mm and stable. There are surgical staple lines in the pelvis. No significant bone, gas or soft tissue abnormalities are noted.       . Patient confirmed No Neulasta OnPro Device.           Urinalysis w/Scope Dipstick Dipstick Cont'd Micro  Color: Yellow Bilirubin: Neg mg/dL WBC/hpf: 10 - 20/hpf  Appearance: Clear Ketones: Neg mg/dL RBC/hpf: NS (Not Seen)  Specific Gravity: 1.015 Blood: Neg ery/uL Bacteria: NS (Not Seen)  pH: 6.0 Protein: Trace mg/dL Cystals: NS (Not Seen)  Glucose: Neg mg/dL Urobilinogen: 0.2 mg/dL Casts: NS (Not Seen)    Nitrites: Neg Trichomonas: Not Present    Leukocyte Esterase: 1+ leu/uL Mucous: Present      Epithelial Cells: 0 - 5/hpf  Yeast: NS (Not Seen)      Sperm: Not Present    Notes: unspun specimen    ASSESSMENT:      ICD-10 Details  1 GU:   Renal calculus - N20.0 Chronic, Worsening - She has a slowly enlarging LLP stone that is now up to 54mm and is probably aggravated by the topomax, which she would really rather not stop. Since the stone has grown progressively over the last 5 years, I think we need to consider treatment before it gets too big. I have discussed the options for treatment including ESWL, URS and PCNL. I think based on the stone appearance that it should fragment with ESWL but did explain that there is a 15% possibility of needing a secondary procedure. PCNL would have the highest stone free rate but with this particular stone the size is appropriate for ESWL or URS. I am going to get her set up for ESWL. I reviewed the risks of ESWL including bleeding, infection, injury to the kidney or adjacent structures, failure to fragment the stone, need for ancillary procedures, thrombotic events, cardiac arrhythmias  and sedation complications.   2   Flank Pain - R10.84 Chronic, Stable - The RUQ calcification is most consistent with a phlebolith and was not in the ureter on a retrograde in 2017 and remains otherwise unchanged. Her pain is most consistent with a musculoskeletal cause.   3 NON-GU:   Pyuria/other UA findings - R82.79 Minor - She has WBC on UA and with the stone needing treatment, I will get a culture.    PLAN:           Orders Labs Urinalysis, CULTURE, URINE  X-Rays: KUB          Schedule Return Visit/Planned Activity: Next Available Appointment - Schedule Surgery  Procedure: Unspecified Date - ESWL - [id number], left

## 2021-09-01 ENCOUNTER — Encounter (HOSPITAL_BASED_OUTPATIENT_CLINIC_OR_DEPARTMENT_OTHER): Payer: Self-pay | Admitting: Urology

## 2021-09-10 ENCOUNTER — Other Ambulatory Visit: Payer: Self-pay

## 2021-09-10 ENCOUNTER — Ambulatory Visit: Payer: Medicare HMO | Admitting: Obstetrics and Gynecology

## 2021-09-10 ENCOUNTER — Ambulatory Visit (INDEPENDENT_AMBULATORY_CARE_PROVIDER_SITE_OTHER): Payer: Medicare HMO | Admitting: Obstetrics and Gynecology

## 2021-09-10 ENCOUNTER — Encounter: Payer: Self-pay | Admitting: Obstetrics and Gynecology

## 2021-09-10 VITALS — BP 118/78 | HR 78 | Resp 14 | Ht 59.5 in | Wt 120.0 lb

## 2021-09-10 DIAGNOSIS — L299 Pruritus, unspecified: Secondary | ICD-10-CM | POA: Insufficient documentation

## 2021-09-10 DIAGNOSIS — N9089 Other specified noninflammatory disorders of vulva and perineum: Secondary | ICD-10-CM | POA: Diagnosis not present

## 2021-09-10 DIAGNOSIS — Z411 Encounter for cosmetic surgery: Secondary | ICD-10-CM | POA: Insufficient documentation

## 2021-09-10 DIAGNOSIS — N952 Postmenopausal atrophic vaginitis: Secondary | ICD-10-CM

## 2021-09-10 DIAGNOSIS — D179 Benign lipomatous neoplasm, unspecified: Secondary | ICD-10-CM | POA: Insufficient documentation

## 2021-09-10 DIAGNOSIS — Z01419 Encounter for gynecological examination (general) (routine) without abnormal findings: Secondary | ICD-10-CM

## 2021-09-10 DIAGNOSIS — I872 Venous insufficiency (chronic) (peripheral): Secondary | ICD-10-CM | POA: Insufficient documentation

## 2021-09-10 DIAGNOSIS — L259 Unspecified contact dermatitis, unspecified cause: Secondary | ICD-10-CM | POA: Insufficient documentation

## 2021-09-10 DIAGNOSIS — R159 Full incontinence of feces: Secondary | ICD-10-CM

## 2021-09-10 DIAGNOSIS — L309 Dermatitis, unspecified: Secondary | ICD-10-CM | POA: Insufficient documentation

## 2021-09-10 NOTE — Progress Notes (Signed)
80 y.o. G1P1001 Married White or Caucasian Not Hispanic or Latino female here for annual exam.  H/O hysterectomy. No vaginal bleeding.   She has a h/o a partial colectomy, some fecal incontinence of loose stool. Wears a pad. She has a lot of staining.   Last week she was treated for a kidney stone. Since then she has leaked urine without realizing it. Some hesitancy to void. She is going back to Urology.   She is having bad arthritis in her back, limiting her ability to walk. Also with fibromyalgia. She has trouble opening her legs, trouble cleaning. She uses Cortaid on the mons ~1 x a week for itching.   She has a h/o vulvitis/dermatitis. Chronically irritated. Uses neosporin or Cortaid ~1x a week. Uses the neosporin daily.   She was on vaginal estrogen for atrophy. She stopped it, hasn't noticed a difference.   No LMP recorded. Patient has had a hysterectomy.          Sexually active: No.  The current method of family planning is hysterectomy    Exercising: No.  The patient does not participate in regular exercise at present. Smoker:  no  Health Maintenance: Pap:  years ago  History of abnormal Pap:  no MMG:  09-11-20 density C/BIRADS 1 negative, scheduled 09-14-21  BMD:   08-18-20 osteoporosis-- on Prolia Colonoscopy: UTD per patient  TDaP:  2016 Gardasil: no   reports that she has never smoked. She has never used smokeless tobacco. She reports that she does not drink alcohol and does not use drugs. Son is 11. She has 4 step children, 6 grandchildren, 3 great grand kids. Married x 32 years.   Past Medical History:  Diagnosis Date   Arthritis    Benzodiazepine dependence ([place])    Carotid arterial disease ([facility])    mild   Chronic kidney disease (CKD), stage III (moderate) ([place]) 08/01/2018   -seeing  Nephrology, Dr. Posey Pronto, thought from [facility] per renal notes    Chronic pain    managed by pain clinic   Colon abnormality    didn't work right so part of it was removed at [facility]    Depression    managed by Dr. Toy Care   Diastolic heart failure [facility])    mild on echo 2016   GERD (gastroesophageal reflux disease)    IBS (irritable bowel syndrome)    sees Dr. Earlean Shawl   Macular degeneration    goes to [facility] for this   Migraine    managed by neurologist   Osteoporosis    Vertigo     Past Surgical History:  Procedure Laterality Date   ABDOMINAL ADHESION SURGERY     ABDOMINAL HYSTERECTOMY     APPENDECTOMY     CHOLECYSTECTOMY     COLON SURGERY     CYSTOSCOPY W/ URETERAL STENT PLACEMENT Right 04/27/2016   Procedure: CYSTOSCOPY WITH RETROGRADE PYELOGRAM/ RIGHT URETERAL STENT PLACEMENT;  Surgeon: Ardis Hughs, MD;  Location: [facility];  Service: Urology;  Laterality: Right;   EXTRACORPOREAL SHOCK WAVE LITHOTRIPSY Left 08/31/2021   Procedure: LEFT EXTRACORPOREAL SHOCK WAVE LITHOTRIPSY (ESWL);  Surgeon: Ardis Hughs, MD;  Location: [facility];  Service: Urology;  Laterality: Left;   OVARIAN CYST SURGERY     SMALL INTESTINE SURGERY      Current Outpatient Medications  Medication Sig Dispense Refill   acetaminophen (TYLENOL) 500 MG tablet Take 1,000 mg by mouth daily as needed for headache.     B-D UF III  MINI PEN NEEDLES 31G X 5 MM MISC AS DIRECTED WITH TYMLOS INJECTIONS 100 each 3   Biotin 1000 MCG tablet Take 1,000 mcg by mouth daily.     brimonidine (ALPHAGAN P) 0.1 % SOLN Apply to eye.     Calcium Carbonate (CALTRATE 600 PO) Take by mouth daily.     cycloSPORINE (RESTASIS) 0.05 % ophthalmic emulsion Apply to eye.     denosumab (PROLIA) 60 MG/ML SOSY injection Inject 60 mg into the skin every 6 (six) months.     dorzolamide-timolol (COSOPT) 22.3-6.8 MG/ML ophthalmic solution Place 1 drop into both eyes 2 times daily.     Estradiol 10 MCG TABS vaginal tablet One tablet vaginally daily. 24 tablet 3   folic acid (FOLVITE) 1 MG tablet Take by mouth.     furosemide (LASIX) 40 MG tablet Take 1 tablet (40 mg total) by mouth daily. (from  nephrology) 30 tablet 3   Galcanezumab-gnlm (EMGALITY) 120 MG/ML SOAJ Inject into the skin.     ipratropium (ATROVENT) 0.03 % nasal spray Place 2 sprays into both nostrils 3 (three) times daily. 30 mL 0   lidocaine (XYLOCAINE) 5 % ointment Apply a pea sized amount topically 20-30 minutes prior to using the estrogen tablet. 30 g 1   LINZESS 290 MCG CAPS capsule Take 290 mcg by mouth daily before breakfast.   0   olopatadine (PATANOL) 0.1 % ophthalmic solution 1 drop 2 (two) times daily.     omeprazole (PRILOSEC) 40 MG capsule Take 80 mg by mouth daily.   0   Probiotic Product (ALIGN PO) Take by mouth daily.     sodium bicarbonate 650 MG tablet TK 1 T PO BID  9   SUMAtriptan (IMITREX) 100 MG tablet Take 100 mg by mouth every 2 (two) hours as needed for migraine.      tamsulosin (FLOMAX) 0.4 MG CAPS capsule Take 1 capsule (0.4 mg total) by mouth daily. 7 capsule 0   terconazole (TERAZOL 3) 80 MG vaginal suppository Place 1 suppository (80 mg total) vaginally at bedtime. 3 suppository 0   topiramate (TOPAMAX) 100 MG tablet Take 3 tablets at bedtime     traMADol (ULTRAM) 50 MG tablet Take 1-2 tablets (50-100 mg total) by mouth every 6 (six) hours as needed for moderate pain. 15 tablet 0   Travoprost, BAK Free, (TRAVATAN) 0.004 % SOLN ophthalmic solution 1 drop at bedtime.     tretinoin (RETIN-A) 0.025 % cream APPLY A PEA SIZED AMOUNT TO FACE ONCE DAILY IN THE EVENING     UBRELVY 100 MG TABS      ZENPEP 15000 units CPEP Take 45,000 mg by mouth 2 (two) times daily.  0   No current facility-administered medications for this visit.    Family History  Problem Relation Age of Onset   Kidney disease Mother    Heart disease Mother    Osteoporosis Mother    Heart Problems Sister        pacemaker   Cancer Maternal Aunt        intestinal and liver cancer   Stroke Maternal Grandmother    Heart disease Maternal Grandmother    Stroke Other    Depression Other        everyone   Hypercalcemia Neg Hx     Breast cancer Neg Hx     Review of Systems  All other systems reviewed and are negative.  Exam:   BP 118/78 (BP Location: Right Arm, Patient Position: Sitting, Cuff Size:  Normal)   Pulse 78   Resp 14   Ht 4' 11.5" (1.511 m)   Wt 120 lb (54.4 kg)   BMI 23.83 kg/m   Weight change: @WEIGHTCHANGE @ Height:   Height: 4' 11.5" (151.1 cm)  Ht Readings from Last 3 Encounters:  09/10/21 4' 11.5" (1.511 m)  08/31/21 5' (1.524 m)  07/16/21 5' (1.524 m)    General appearance: alert, cooperative and appears stated age Head: Normocephalic, without obvious abnormality, atraumatic Neck: no adenopathy, supple, symmetrical, trachea midline and thyroid normal to inspection and palpation Breasts: normal appearance, no masses or tenderness Abdomen: soft, non-tender; non distended,  no masses,  no organomegaly Extremities: extremities normal, atraumatic, no cyanosis or edema Skin: Skin color, texture, turgor normal. No rashes or lesions Lymph nodes: Cervical, supraclavicular, and axillary nodes normal. No abnormal inguinal nodes palpated Neurologic: Grossly normal   Pelvic: External genitalia:  no lesions, thin, minimal erythema              Urethra:  normal appearing urethra with no masses, tenderness or lesions              Bartholins and Skenes: normal                 Vagina: atrophic appearing vagina with normal color and discharge, no lesions              Cervix: absent               Bimanual Exam:  Uterus:  uterus absent              Adnexa: no mass, fullness, tenderness               Rectovaginal: Confirms               Anus:  normal sphincter tone, no lesions  Karmen Bongo, RN chaperoned for the exam.    1. Well woman exam Mammogram scheduled No pap needed Osteoporosis managed by primary Colonoscopy UTD Discussed breast self exam Discussed calcium and vit D intake  2. Incontinence of feces, unspecified fecal incontinence type Try metamucil Use vaseline for the skin  externally to prevent irritation Discussed option of PT  3. Vaginal atrophy Doing okay off of vaginal estrogen  4. Vulvar irritation Mild Discussed vulvar skin care, information given  In addition to the breast and pelvic exam, over 20 minutes was spent in total patient care with her other issues, including counseling of her fecal incontinence and vulvar irritation

## 2021-09-10 NOTE — Patient Instructions (Signed)
EXERCISE   We recommended that you start or continue a regular exercise program for good health. Physical activity is anything that gets your body moving, some is better than none. The CDC recommends 150 minutes per week of Moderate-Intensity Aerobic Activity and 2 or more days of Muscle Strengthening Activity.  Benefits of exercise are limitless: helps weight loss/weight maintenance, improves mood and energy, helps with depression and anxiety, improves sleep, tones and strengthens muscles, improves balance, improves bone density, protects from chronic conditions such as heart disease, high blood pressure and diabetes and so much more. To learn more visit: https://www.cdc.gov/physicalactivity/index.html  DIET: Good nutrition starts with a healthy diet of fruits, vegetables, whole grains, and lean protein sources. Drink plenty of water for hydration. Minimize empty calories, sodium, sweets. For more information about dietary recommendations visit: https://health.gov/our-work/nutrition-physical-activity/dietary-guidelines and https://www.myplate.gov/  ALCOHOL:  Women should limit their alcohol intake to no more than 7 drinks/beers/glasses of wine (combined, not each!) per week. Moderation of alcohol intake to this level decreases your risk of breast cancer and liver damage.  If you are concerned that you may have a problem, or your friends have told you they are concerned about your drinking, there are many resources to help. A well-known program that is free, effective, and available to all people all over the nation is Alcoholics Anonymous.  Check out this site to learn more: https://www.aa.org/   CALCIUM AND VITAMIN D:  Adequate intake of calcium and Vitamin D are recommended for bone health.  You should be getting between 1000-1200 mg of calcium and 800 units of Vitamin D daily between diet and supplements  PAP SMEARS:  Pap smears, to check for cervical cancer or precancers,  have traditionally been  done yearly, scientific advances have shown that most women can have pap smears less often.  However, every woman still should have a physical exam from her gynecologist every year. It will include a breast check, inspection of the vulva and vagina to check for abnormal growths or skin changes, a visual exam of the cervix, and then an exam to evaluate the size and shape of the uterus and ovaries. We will also provide age appropriate advice regarding health maintenance, like when you should have certain vaccines, screening for sexually transmitted diseases, bone density testing, colonoscopy, mammograms, etc.   MAMMOGRAMS:  All women over 40 years old should have a routine mammogram.   COLON CANCER SCREENING: Now recommend starting at age 45. At this time colonoscopy is not covered for routine screening until 50. There are take home tests that can be done between 45-49.   COLONOSCOPY:  Colonoscopy to screen for colon cancer is recommended for all women at age 50.  We know, you hate the idea of the prep.  We agree, BUT, having colon cancer and not knowing it is worse!!  Colon cancer so often starts as a polyp that can be seen and removed at colonscopy, which can quite literally save your life!  And if your first colonoscopy is normal and you have no family history of colon cancer, most women don't have to have it again for 10 years.  Once every ten years, you can do something that may end up saving your life, right?  We will be happy to help you get it scheduled when you are ready.  Be sure to check your insurance coverage so you understand how much it will cost.  It may be covered as a preventative service at no cost, but you should check   your particular policy.      Breast Self-Awareness Breast self-awareness means being familiar with how your breasts look and feel. It involves checking your breasts regularly and reporting any changes to your health care provider. Practicing breast self-awareness is  important. A change in your breasts can be a sign of a serious medical problem. Being familiar with how your breasts look and feel allows you to find any problems early, when treatment is more likely to be successful. All women should practice breast self-awareness, including women who have had breast implants. How to do a breast self-exam One way to learn what is normal for your breasts and whether your breasts are changing is to do a breast self-exam. To do a breast self-exam: Look for Changes  Remove all the clothing above your waist. Stand in front of a mirror in a room with good lighting. Put your hands on your hips. Push your hands firmly downward. Compare your breasts in the mirror. Look for differences between them (asymmetry), such as: Differences in shape. Differences in size. Puckers, dips, and bumps in one breast and not the other. Look at each breast for changes in your skin, such as: Redness. Scaly areas. Look for changes in your nipples, such as: Discharge. Bleeding. Dimpling. Redness. A change in position. Feel for Changes Carefully feel your breasts for lumps and changes. It is best to do this while lying on your back on the floor and again while sitting or standing in the shower or tub with soapy water on your skin. Feel each breast in the following way: Place the arm on the side of the breast you are examining above your head. Feel your breast with the other hand. Start in the nipple area and make  inch (2 cm) overlapping circles to feel your breast. Use the pads of your three middle fingers to do this. Apply light pressure, then medium pressure, then firm pressure. The light pressure will allow you to feel the tissue closest to the skin. The medium pressure will allow you to feel the tissue that is a little deeper. The firm pressure will allow you to feel the tissue close to the ribs. Continue the overlapping circles, moving downward over the breast until you feel your  ribs below your breast. Move one finger-width toward the center of the body. Continue to use the  inch (2 cm) overlapping circles to feel your breast as you move slowly up toward your collarbone. Continue the up and down exam using all three pressures until you reach your armpit.  Write Down What You Find  Write down what is normal for each breast and any changes that you find. Keep a written record with breast changes or normal findings for each breast. By writing this information down, you do not need to depend only on memory for size, tenderness, or location. Write down where you are in your menstrual cycle, if you are still menstruating. If you are having trouble noticing differences in your breasts, do not get discouraged. With time you will become more familiar with the variations in your breasts and more comfortable with the exam. How often should I examine my breasts? Examine your breasts every month. If you are breastfeeding, the best time to examine your breasts is after a feeding or after using a breast pump. If you menstruate, the best time to examine your breasts is 5-7 days after your period is over. During your period, your breasts are lumpier, and it may be more   difficult to notice changes. When should I see my health care provider? See your health care provider if you notice: A change in shape or size of your breasts or nipples. A change in the skin of your breast or nipples, such as a reddened or scaly area. Unusual discharge from your nipples. A lump or thick area that was not there before. Pain in your breasts. Anything that concerns you. Fecal Incontinence Fecal incontinence, also called accidental bowel leakage, is not being able to control your bowels. This condition happens because the nerves or muscles around the anus do not work the way they should. This affects their ability to hold stool (feces). What are the causes? This condition may be caused by: Damage to the  muscles at the end of the rectum (sphincter). Damage to the nerves that control bowel movements. Diarrhea. Chronic constipation. Pelvic floor dysfunction. This means the muscles in the pelvis do not work well. Loss of bowel storage capacity. This occurs when the rectum can no longer stretch in size in order to store feces. Inflammatory bowel disease (IBD), such as Crohn's disease. Irritable bowel syndrome (IBS). What increases the risk? You are more likely to develop this condition if you: Were born with bowels or a pelvis that did not form correctly. Have had rectal surgery. Have had radiation treatment for certain cancers. Have been pregnant, had a vaginal delivery, or had surgery that damaged the pelvic floor muscles. Had a complicated childbirth, spinal cord injury, or other trauma that caused nerve damage. Have a condition that can affect nerve function, such as diabetes, Parkinson's disease, or multiple sclerosis. Have a condition where the rectum drops down into the anus or vagina (prolapse). Are 32 years of age or older. What are the signs or symptoms? The main symptom of this condition is not being able to control your bowels. You also might not be able to get to the bathroom before a bowel movement. How is this diagnosed? This condition is diagnosed with a medical history and physical exam. You may also have other tests, including: Blood tests. Urine tests. A rectal exam. Ultrasound. MRI. Colonoscopy. This is an exam that looks at your large intestine (colon). Anal manometry. This is a test that measures the strength of the anal sphincter. Anal electromyogram (EMG). This is a test that uses small electrodes to check for nerve damage. How is this treated? Treatment for this condition depends on the cause and severity. Treatment may also focus on addressing any underlying causes of this condition. Treatment may include: Medicines. This may include medicines to: Prevent  diarrhea. Help with constipation (bulk-forming laxatives). Treat any underlying conditions. Biofeedback therapy. This can help to retrain muscles that are affected. Fiber supplements. These can help manage your bowel movements. Nerve stimulation. Injectable gel to promote tissue growth and better muscle control. Surgery. You may need: Sphincter repair surgery. Diversion surgery. This procedure lets feces pass out of your body through a hole in your abdomen. Follow these instructions at home: Eating and drinking  Follow instructions from your health care provider about any eating or drinking restrictions. Work with a dietitian to come up with a healthy diet that will help you avoid the foods that can make your condition worse. Keep a diet diary to find out which foods or drinks could be making your condition worse. Drink enough fluid to keep your urine pale yellow. Lifestyle Do not use any products that contain nicotine or tobacco, such as cigarettes and e-cigarettes. If you need  help quitting, ask your health care provider. This may help your condition. If you are overweight, talk with your health care provider about how to safely lose weight. This may help your condition. Increase your physical activity as told by your health care provider. This may help your condition. Always talk with your health care provider before starting a new exercise program. Carry a change of clothes and supplies to clean up quickly if you have an episode of fetal incontinence. Consider joining a fecal incontinence support group. You can find a support group online or in your local community. General instructions  Take over-the-counter and prescription medicines only as told by your health care provider. This includes any supplements. Apply a moisture barrier, such as petroleum jelly, to your rectum. This protects the skin from irritation caused by ongoing leaking or diarrhea. Tell your health care provider if  you are upset or depressed about your condition. Keep all follow-up visits as told by your health care provider. This is important. Where to find more information [employer] Functional Gastrointestinal Disorders: iffgd.[employer] of Gastroenterology: patients.gi.org Contact a health care provider if: You have a fever. You have redness, swelling, or pain around your rectum. Your pain is getting worse or you lose feeling in your rectal area. You have blood in your stool. You feel sad or hopeless. You avoid social or work situations. Get help right away if: You stop having bowel movements. You cannot eat or drink without vomiting. You have rectal bleeding that does not stop. You have severe pain that is getting worse. You have symptoms of dehydration, including: Sleepiness or fatigue. Producing little or no urine, tears, or sweat. Dizziness. Dry mouth. Unusual irritability. Headache. Inability to think clearly. Summary Fecal incontinence, also called accidental bowel leakage, is not being able to control your bowels. This condition happens because the nerves or muscles around the anus do not work the way they should. Treatment varies depending on the cause and severity of your condition. Treatment may also focus on addressing any underlying causes of this condition. Follow instructions from your health care provider about any eating or drinking restrictions, lifestyle changes, and skin care. Take over-the-counter and prescription medicines only as told by your health care provider. This includes any supplements. Tell your health care provider if your symptoms worsen or if you are upset or depressed about your condition. This information is not intended to replace advice given to you by your health care provider. Make sure you discuss any questions you have with your health care provider. Document Revised: 03/30/2018 Document Reviewed: 03/30/2018 Elsevier  Patient Education  2022 [employer].

## 2021-09-14 ENCOUNTER — Other Ambulatory Visit: Payer: Self-pay

## 2021-09-14 ENCOUNTER — Ambulatory Visit
Admission: RE | Admit: 2021-09-14 | Discharge: 2021-09-14 | Disposition: A | Discharge status: Home / Self Care | Payer: Medicare HMO | Source: Ambulatory Visit | Attending: Family Medicine | Admitting: Family Medicine

## 2021-09-14 DIAGNOSIS — Z1231 Encounter for screening mammogram for malignant neoplasm of breast: Secondary | ICD-10-CM | POA: Diagnosis not present

## 2021-09-15 ENCOUNTER — Ambulatory Visit: Payer: Medicare HMO | Admitting: Endocrinology

## 2021-09-15 DIAGNOSIS — K5939 Other megacolon: Secondary | ICD-10-CM | POA: Diagnosis not present

## 2021-09-15 DIAGNOSIS — K6389 Other specified diseases of intestine: Secondary | ICD-10-CM | POA: Diagnosis not present

## 2021-09-15 DIAGNOSIS — N200 Calculus of kidney: Secondary | ICD-10-CM | POA: Diagnosis not present

## 2021-09-15 DIAGNOSIS — I700 Atherosclerosis of aorta: Secondary | ICD-10-CM | POA: Diagnosis not present

## 2021-09-23 ENCOUNTER — Other Ambulatory Visit: Payer: Self-pay | Admitting: Urology

## 2021-10-06 ENCOUNTER — Encounter (HOSPITAL_BASED_OUTPATIENT_CLINIC_OR_DEPARTMENT_OTHER): Payer: Self-pay | Admitting: Urology

## 2021-10-06 NOTE — Progress Notes (Signed)
Spoke w/ via phone for pre-op interview---pt spouse Danielle Gaines per pt request Lab needs dos----  I stat, ekg             Lab results------lov dr patel  nephrology 08-19-2021 on chart COVID test -----patient  spouse Danielle Gaines states asymptomatic no test needed Arrive at -------945 am 10-09-2021 NPO after MN NO Solid Food.  Clear liquids from MN until---845 am Med rec completed Medications to take morning of surgery -----lorazepam, eye drops Diabetic medication -----n/a Patient instructed no nail polish to be worn day of surgery Patient instructed to bring photo id and insurance card day of surgery Patient aware to have Driver (ride ) / caregiver    for 24 hours after surgery  spouse Danielle Gaines Patient Special Instructions -----none Pre-Op special Istructions -----none Patient verbalized understanding of instructions that were given at this phone interview. Patient denies shortness of breath, chest pain, fever, cough at this phone interview.   Echo 04-08-2015 epic

## 2021-10-07 DIAGNOSIS — Z961 Presence of intraocular lens: Secondary | ICD-10-CM | POA: Diagnosis not present

## 2021-10-07 DIAGNOSIS — N200 Calculus of kidney: Secondary | ICD-10-CM | POA: Diagnosis not present

## 2021-10-07 DIAGNOSIS — L718 Other rosacea: Secondary | ICD-10-CM | POA: Diagnosis not present

## 2021-10-07 DIAGNOSIS — H30033 Focal chorioretinal inflammation, peripheral, bilateral: Secondary | ICD-10-CM | POA: Diagnosis not present

## 2021-10-07 DIAGNOSIS — H0288A Meibomian gland dysfunction right eye, upper and lower eyelids: Secondary | ICD-10-CM | POA: Diagnosis not present

## 2021-10-07 DIAGNOSIS — H00011 Hordeolum externum right upper eyelid: Secondary | ICD-10-CM | POA: Diagnosis not present

## 2021-10-07 DIAGNOSIS — Z9889 Other specified postprocedural states: Secondary | ICD-10-CM | POA: Diagnosis not present

## 2021-10-07 DIAGNOSIS — H0288B Meibomian gland dysfunction left eye, upper and lower eyelids: Secondary | ICD-10-CM | POA: Diagnosis not present

## 2021-10-09 ENCOUNTER — Ambulatory Visit (HOSPITAL_BASED_OUTPATIENT_CLINIC_OR_DEPARTMENT_OTHER)
Admission: RE | Admit: 2021-10-09 | Discharge: 2021-10-09 | Disposition: A | Discharge status: Home / Self Care | Payer: Medicare HMO | Source: Ambulatory Visit | Attending: Urology | Admitting: Urology

## 2021-10-09 ENCOUNTER — Ambulatory Visit (HOSPITAL_BASED_OUTPATIENT_CLINIC_OR_DEPARTMENT_OTHER): Payer: Medicare HMO | Admitting: Anesthesiology

## 2021-10-09 ENCOUNTER — Encounter (HOSPITAL_BASED_OUTPATIENT_CLINIC_OR_DEPARTMENT_OTHER): Payer: Self-pay | Admitting: Urology

## 2021-10-09 ENCOUNTER — Encounter (HOSPITAL_BASED_OUTPATIENT_CLINIC_OR_DEPARTMENT_OTHER)
Admission: RE | Disposition: A | Discharge status: Home / Self Care | Payer: Self-pay | Source: Ambulatory Visit | Attending: Urology

## 2021-10-09 DIAGNOSIS — G43909 Migraine, unspecified, not intractable, without status migrainosus: Secondary | ICD-10-CM | POA: Diagnosis not present

## 2021-10-09 DIAGNOSIS — N200 Calculus of kidney: Secondary | ICD-10-CM | POA: Diagnosis not present

## 2021-10-09 DIAGNOSIS — K219 Gastro-esophageal reflux disease without esophagitis: Secondary | ICD-10-CM | POA: Diagnosis not present

## 2021-10-09 DIAGNOSIS — K5900 Constipation, unspecified: Secondary | ICD-10-CM | POA: Diagnosis not present

## 2021-10-09 HISTORY — DX: Anemia, unspecified: D64.9

## 2021-10-09 HISTORY — DX: Presence of external hearing-aid: Z97.4

## 2021-10-09 HISTORY — DX: Cellulitis and abscess of mouth: K12.2

## 2021-10-09 HISTORY — PX: CYSTOSCOPY WITH RETROGRADE PYELOGRAM, URETEROSCOPY AND STENT PLACEMENT: SHX5789

## 2021-10-09 HISTORY — DX: Hordeolum externum unspecified eye, unspecified eyelid: H00.019

## 2021-10-09 HISTORY — DX: Anxiety disorder, unspecified: F41.9

## 2021-10-09 HISTORY — DX: Presence of spectacles and contact lenses: Z97.3

## 2021-10-09 HISTORY — PX: HOLMIUM LASER APPLICATION: SHX5852

## 2021-10-09 LAB — POCT I-STAT, CHEM 8
BUN: 33 mg/dL — ABNORMAL HIGH (ref 8–23)
BUN: 29 mg/dL — ABNORMAL HIGH (ref 8–23)
Calcium, Ion: 1.3 mmol/L (ref 1.15–1.40)
Calcium, Ion: 1.31 mmol/L (ref 1.15–1.40)
Chloride: 115 mmol/L — ABNORMAL HIGH (ref 98–111)
Chloride: 114 mmol/L — ABNORMAL HIGH (ref 98–111)
Creatinine, Ser: 2.6 mg/dL — ABNORMAL HIGH (ref 0.44–1.00)
Creatinine, Ser: 2 mg/dL — ABNORMAL HIGH (ref 0.44–1.00)
Glucose, Bld: 99 mg/dL (ref 70–99)
Glucose, Bld: 115 mg/dL — ABNORMAL HIGH (ref 70–99)
HCT: 47 % — ABNORMAL HIGH (ref 36.0–46.0)
HCT: 44 % (ref 36.0–46.0)
Hemoglobin: 16 g/dL — ABNORMAL HIGH (ref 12.0–15.0)
Hemoglobin: 15 g/dL (ref 12.0–15.0)
Potassium: 3 mmol/L — ABNORMAL LOW (ref 3.5–5.1)
Potassium: 3.1 mmol/L — ABNORMAL LOW (ref 3.5–5.1)
Sodium: 138 mmol/L (ref 135–145)
Sodium: 140 mmol/L (ref 135–145)
TCO2: 12 mmol/L — ABNORMAL LOW (ref 22–32)
TCO2: 14 mmol/L — ABNORMAL LOW (ref 22–32)

## 2021-10-09 SURGERY — CYSTOURETEROSCOPY, WITH RETROGRADE PYELOGRAM AND STENT INSERTION
Anesthesia: General | Site: Renal | Laterality: Left

## 2021-10-09 MED ORDER — ONDANSETRON HCL 4 MG/2ML IJ SOLN
INTRAMUSCULAR | Status: AC
  Filled 2021-10-09: qty 2

## 2021-10-09 MED ORDER — PROPOFOL 10 MG/ML IV BOLUS
INTRAVENOUS | Status: DC | PRN
  Administered 2021-10-09: 110 mg via INTRAVENOUS

## 2021-10-09 MED ORDER — ACETAMINOPHEN 325 MG RE SUPP: 650.0000 mg | RECTAL | Status: DC | PRN

## 2021-10-09 MED ORDER — IOHEXOL 300 MG/ML  SOLN
INTRAMUSCULAR | Status: DC | PRN
  Administered 2021-10-09: 4 mL

## 2021-10-09 MED ORDER — SODIUM CHLORIDE 0.9 % IR SOLN
Status: DC | PRN
  Administered 2021-10-09: 3000 mL

## 2021-10-09 MED ORDER — ONDANSETRON HCL 4 MG/2ML IJ SOLN
INTRAMUSCULAR | Status: DC | PRN
  Administered 2021-10-09: 4 mg via INTRAVENOUS

## 2021-10-09 MED ORDER — MORPHINE SULFATE (PF) 4 MG/ML IV SOLN: 1.0000 mg | INTRAVENOUS | Status: DC | PRN

## 2021-10-09 MED ORDER — SODIUM CHLORIDE 0.9% FLUSH: 3.0000 mL | INTRAVENOUS | Status: DC | PRN

## 2021-10-09 MED ORDER — CEFAZOLIN SODIUM-DEXTROSE 2-4 GM/100ML-% IV SOLN
INTRAVENOUS | Status: AC
  Filled 2021-10-09: qty 100

## 2021-10-09 MED ORDER — SODIUM CHLORIDE 0.9 % IV SOLN: INTRAVENOUS | Status: DC

## 2021-10-09 MED ORDER — FENTANYL CITRATE (PF) 100 MCG/2ML IJ SOLN
INTRAMUSCULAR | Status: DC | PRN
  Administered 2021-10-09: 50 ug via INTRAVENOUS

## 2021-10-09 MED ORDER — PHENYLEPHRINE 40 MCG/ML (10ML) SYRINGE FOR IV PUSH (FOR BLOOD PRESSURE SUPPORT)
PREFILLED_SYRINGE | INTRAVENOUS | Status: DC | PRN
  Administered 2021-10-09 (×2): 120 ug via INTRAVENOUS

## 2021-10-09 MED ORDER — ONDANSETRON HCL 4 MG/2ML IJ SOLN: 4.0000 mg | Freq: Once | INTRAMUSCULAR | Status: DC | PRN

## 2021-10-09 MED ORDER — ACETAMINOPHEN 325 MG PO TABS: 650.0000 mg | ORAL_TABLET | ORAL | Status: DC | PRN

## 2021-10-09 MED ORDER — MIDAZOLAM HCL 2 MG/2ML IJ SOLN
INTRAMUSCULAR | Status: AC
  Filled 2021-10-09: qty 2

## 2021-10-09 MED ORDER — HYDROCODONE-ACETAMINOPHEN 5-325 MG PO TABS
1.0000 | ORAL_TABLET | Freq: Four times a day (QID) | ORAL | 0 refills | Status: DC | PRN
Start: 2021-10-09 — End: 2022-08-20

## 2021-10-09 MED ORDER — OXYCODONE HCL 5 MG PO TABS: 5.0000 mg | ORAL_TABLET | ORAL | Status: DC | PRN

## 2021-10-09 MED ORDER — CEFAZOLIN SODIUM-DEXTROSE 2-4 GM/100ML-% IV SOLN
2.0000 g | INTRAVENOUS | Status: AC
  Administered 2021-10-09: 2 g via INTRAVENOUS

## 2021-10-09 MED ORDER — LIDOCAINE 2% (20 MG/ML) 5 ML SYRINGE
INTRAMUSCULAR | Status: AC
  Filled 2021-10-09: qty 5

## 2021-10-09 MED ORDER — OXYCODONE HCL 5 MG PO TABS
5.0000 mg | ORAL_TABLET | Freq: Once | ORAL | Status: AC | PRN
  Administered 2021-10-09: 5 mg via ORAL

## 2021-10-09 MED ORDER — SODIUM CHLORIDE 0.9 % IV SOLN: 250.0000 mL | INTRAVENOUS | Status: DC | PRN

## 2021-10-09 MED ORDER — OXYCODONE HCL 5 MG PO TABS
ORAL_TABLET | ORAL | Status: AC
  Filled 2021-10-09: qty 1

## 2021-10-09 MED ORDER — PHENYLEPHRINE 40 MCG/ML (10ML) SYRINGE FOR IV PUSH (FOR BLOOD PRESSURE SUPPORT)
PREFILLED_SYRINGE | INTRAVENOUS | Status: AC
  Filled 2021-10-09: qty 10

## 2021-10-09 MED ORDER — LIDOCAINE 2% (20 MG/ML) 5 ML SYRINGE
INTRAMUSCULAR | Status: DC | PRN
  Administered 2021-10-09: 100 mg via INTRAVENOUS

## 2021-10-09 MED ORDER — PROPOFOL 10 MG/ML IV BOLUS
INTRAVENOUS | Status: AC
  Filled 2021-10-09: qty 20

## 2021-10-09 MED ORDER — DEXAMETHASONE SODIUM PHOSPHATE 10 MG/ML IJ SOLN
INTRAMUSCULAR | Status: DC | PRN
  Administered 2021-10-09: 10 mg via INTRAVENOUS

## 2021-10-09 MED ORDER — FENTANYL CITRATE (PF) 100 MCG/2ML IJ SOLN
INTRAMUSCULAR | Status: AC
  Filled 2021-10-09: qty 2

## 2021-10-09 MED ORDER — FENTANYL CITRATE (PF) 100 MCG/2ML IJ SOLN: 25.0000 ug | INTRAMUSCULAR | Status: DC | PRN

## 2021-10-09 MED ORDER — GLYCOPYRROLATE PF 0.2 MG/ML IJ SOSY
PREFILLED_SYRINGE | INTRAMUSCULAR | Status: DC | PRN
  Administered 2021-10-09: .2 mg via INTRAVENOUS

## 2021-10-09 MED ORDER — OXYCODONE HCL 5 MG/5ML PO SOLN: 5.0000 mg | Freq: Once | ORAL | Status: AC | PRN

## 2021-10-09 MED ORDER — DEXAMETHASONE SODIUM PHOSPHATE 10 MG/ML IJ SOLN
INTRAMUSCULAR | Status: AC
  Filled 2021-10-09: qty 1

## 2021-10-09 MED ORDER — ACETAMINOPHEN 10 MG/ML IV SOLN: 1000.0000 mg | Freq: Once | INTRAVENOUS | Status: DC | PRN

## 2021-10-09 MED ORDER — SODIUM CHLORIDE 0.9% FLUSH: 3.0000 mL | Freq: Two times a day (BID) | INTRAVENOUS | Status: DC

## 2021-10-09 MED ORDER — MIDAZOLAM HCL 5 MG/5ML IJ SOLN
INTRAMUSCULAR | Status: DC | PRN
  Administered 2021-10-09: 1 mg via INTRAVENOUS

## 2021-10-09 SURGICAL SUPPLY — 34 items
BAG DRAIN URO-CYSTO SKYTR STRL (DRAIN) ×2 IMPLANT
BAG DRN UROCATH (DRAIN) ×1
BASKET STONE 1.7 NGAGE (UROLOGICAL SUPPLIES) ×1 IMPLANT
BASKET ZERO TIP NITINOL 2.4FR (BASKET) IMPLANT
BSKT STON RTRVL ZERO TP 2.4FR (BASKET)
CATH URET 5FR 28IN CONE TIP (BALLOONS) ×2
CATH URET 5FR 28IN OPEN ENDED (CATHETERS) IMPLANT
CATH URET 5FR 70CM CONE TIP (BALLOONS) IMPLANT
CLOTH BEACON ORANGE TIMEOUT ST (SAFETY) ×2 IMPLANT
COVER DOME SNAP 22 D (MISCELLANEOUS) ×1 IMPLANT
ELECT REM PT RETURN 9FT ADLT (ELECTROSURGICAL)
ELECTRODE REM PT RTRN 9FT ADLT (ELECTROSURGICAL) IMPLANT
FIBER LASER FLEXIVA 365 (UROLOGICAL SUPPLIES) IMPLANT
GLOVE SURG NEOP MICRO LF SZ6.5 (GLOVE) ×1 IMPLANT
GLOVE SURG POLYISO LF SZ8 (GLOVE) ×2 IMPLANT
GLOVE SURG UNDER POLY LF SZ7 (GLOVE) ×2 IMPLANT
GOWN STRL REUS W/ TWL LRG LVL3 (GOWN DISPOSABLE) IMPLANT
GOWN STRL REUS W/TWL LRG LVL3 (GOWN DISPOSABLE) ×4
GOWN STRL REUS W/TWL XL LVL3 (GOWN DISPOSABLE) ×2 IMPLANT
GUIDEWIRE ANG ZIPWIRE 038X150 (WIRE) IMPLANT
GUIDEWIRE STR DUAL SENSOR (WIRE) ×2 IMPLANT
INFUSOR MANOMETER BAG 3000ML (MISCELLANEOUS) IMPLANT
IV NS IRRIG 3000ML ARTHROMATIC (IV SOLUTION) ×2 IMPLANT
KIT TURNOVER CYSTO (KITS) ×2 IMPLANT
MANIFOLD NEPTUNE II (INSTRUMENTS) ×2 IMPLANT
NS IRRIG 500ML POUR BTL (IV SOLUTION) ×1 IMPLANT
PACK CYSTO (CUSTOM PROCEDURE TRAY) ×2 IMPLANT
SHEATH URET ACCESS 12FR/35CM (UROLOGICAL SUPPLIES) ×1 IMPLANT
SHEATH URETERAL 12FRX28CM (UROLOGICAL SUPPLIES) ×1 IMPLANT
STENT URET 6FRX22 CONTOUR (STENTS) ×1 IMPLANT
TRACTIP FLEXIVA PULS ID 200XHI (Laser) IMPLANT
TRACTIP FLEXIVA PULSE ID 200 (Laser) ×2
TUBE CONNECTING 12X1/4 (SUCTIONS) ×2 IMPLANT
TUBING UROLOGY SET (TUBING) ×1 IMPLANT

## 2021-10-09 NOTE — Op Note (Signed)
Procedure: 1.  Cystoscopy with left retrograde pyelogram and interpretation. 2.  Left ureteroscopy with laser lithotripsy, stone extraction and insertion of left double-J stent. 3.  Application of fluoroscopy.  Preop diagnosis: Left lower pole stones.  Postop diagnosis: Same.  Surgeon: Dr. Irine Seal.  Anesthesia: General.  Specimen: Stones.  Drains: 6 French by 22 cm left contour double-J stent.  EBL: None.  Complications: None.  Indications: The patient is a 80 year old female who has a left lower pole stone that was originally treated with lithotripsy but only partially fragmented.  She is continue to have pain of uncertain etiology and would like to have completion of the procedure.  Procedure: She was taken operating room where she was given antibiotics.  General anesthetic was induced.  She was placed in lithotomy position and fitted with PAS hose.  Her perineum and genitalia were prepped with Betadine solution and she was draped in usual sterile fashion.  Cystoscopy was performed using a 21 [place] scope and 30 degree lens.  Examination showed a normal urethra.  The bladder wall was smooth and pale without tumors, stones or inflammation.  Ureteral orifices were unremarkable.  The left ureteral orifice was cannulated with 5 [place] open-ended catheter and half-strength Omnipaque was instilled for the retrograde pyelogram.  Left retrograde pyelogram demonstrated a normal ureter and intrarenal collecting system apart from a filling defect in the lower pole consistent with her stone.  A sensor wire was then advanced the kidney through the open-ended catheter and the open-ended catheter and cystoscope were removed.  The inner core of a 35 cm digital access sheath was passed over the wire was advanced in the proximal ureter where some resistance was encountered.  I then switched to a 28cm 12/14 [place] digital access sheath which was advanced over the wire and passed more easily into  the proximal ureter.  The inner core and wire were then removed.  Initially a single-lumen digital flexible ureteroscope was passed because of concern about the caliber of the proximal ureter however that was subsequently switched for a dual-lumen scope.  But using the single lumen scope and a 200 m laser fiber the stone was easily identified in the lower pole and engaged with the laser.  The settings were initially 0.3 J and 53 Hz on the left paddle and 1 J and 20 Hz on the right pedal but the left pedal energy was increased to 0.5 to improve fragmentation as the stone was rather hard.  The single-lumen scope was switched out for the dual-lumen scope and an engage basket was then used to remove all significant fragments which have been sufficiently fragmented to removed without further lithotripsy.  The remaining material in the kidney was just sand and grit and too small to retrieve with an engage basket, but the bulk of the stone had been removed.  Final inspection of the entire calyceal system identified 1 additional retrievable stone fragment the upper pole which was removed with the engage basket.  Following that no additional significant fragments were noted.  The scope was then removed over the wire and the ureter was inspected on retrieval.  No significant ureteral injury was identified.  The cystoscope was then reinserted over the wire and a 6 [place] by 22 cm contour double-J stent without tether was passed the kidney under fluoroscopic guidance.  Wire was removed, leaving a good coil in the kidney and a good coil in the bladder.  The bladder was drained and the cystoscope was removed.  She was taken down from lithotomy position, her anesthetic was reversed and she was moved to recovery in stable condition.  There were no complications.  She will be given the stone fragments to bring the office for analysis.  She will need stent removal at follow-up.

## 2021-10-09 NOTE — Anesthesia Preprocedure Evaluation (Signed)
Anesthesia Evaluation  Patient identified by MRN, date of birth, ID band Patient awake    Reviewed: Allergy & Precautions, NPO status , Patient's Chart, lab work & pertinent test results  Airway Mallampati: II  TM Distance: >3 FB Neck ROM: Full    Dental no notable dental hx.    Pulmonary neg pulmonary ROS,    Pulmonary exam normal breath sounds clear to auscultation       Cardiovascular negative cardio ROS Normal cardiovascular exam Rhythm:Regular Rate:Normal     Neuro/Psych Anxiety negative neurological ROS     GI/Hepatic Neg liver ROS, GERD  Medicated,  Endo/Other  negative endocrine ROS  Renal/GU Renal InsufficiencyRenal disease  negative genitourinary   Musculoskeletal negative musculoskeletal ROS (+)   Abdominal   Peds negative pediatric ROS (+)  Hematology negative hematology ROS (+)   Anesthesia Other Findings   Reproductive/Obstetrics negative OB ROS                             Anesthesia Physical Anesthesia Plan  ASA: 2  Anesthesia Plan: General   Post-op Pain Management:    Induction: Intravenous  PONV Risk Score and Plan: 3 and Ondansetron, Dexamethasone and Treatment may vary due to age or medical condition  Airway Management Planned: LMA  Additional Equipment:   Intra-op Plan:   Post-operative Plan: Extubation in OR  Informed Consent: I have reviewed the patients History and Physical, chart, labs and discussed the procedure including the risks, benefits and alternatives for the proposed anesthesia with the patient or authorized representative who has indicated his/her understanding and acceptance.     Dental advisory given  Plan Discussed with: CRNA and Surgeon  Anesthesia Plan Comments:         Anesthesia Quick Evaluation

## 2021-10-09 NOTE — Anesthesia Procedure Notes (Signed)
Procedure Name: LMA Insertion Date/Time: 10/09/2021 11:39 AM Performed by: Rogers Blocker, CRNA Pre-anesthesia Checklist: Patient identified, Emergency Drugs available, Suction available and Patient being monitored Patient Re-evaluated:Patient Re-evaluated prior to induction Oxygen Delivery Method: Circle System Utilized Preoxygenation: Pre-oxygenation with 100% oxygen Induction Type: IV induction Ventilation: Mask ventilation without difficulty LMA: LMA inserted LMA Size: 4.0 Number of attempts: 1 Airway Equipment and Method: Bite block Placement Confirmation: positive ETCO2 Tube secured with: Tape Dental Injury: Teeth and Oropharynx as per pre-operative assessment

## 2021-10-09 NOTE — OR Nursing (Signed)
1515 Call to Dr. Jeffie Pollock to report pt has no urge to void.  Pt  vitals stable.  Orders received to draw istat.  Istat results improvement over preop results . Pre op creatinine 2.6 new result creatinine 2.0. Darin Engels RN.

## 2021-10-09 NOTE — OR Nursing (Signed)
Bladder scanned for 0cc.  Scanned x 3.  Another nurse Danny Lawless scanned for [employer]. Darin Engels rn

## 2021-10-09 NOTE — Anesthesia Postprocedure Evaluation (Signed)
Anesthesia Post Note  Patient: Danielle Gaines  Procedure(s) Performed: CYSTOSCOPY WITH LEFT RETROGRADE URETEROSCOPY WITH HOLMIUM LASER AND STENT PLACEMENT (Left: Renal) HOLMIUM LASER APPLICATION (Left: Renal)     Patient location during evaluation: PACU Anesthesia Type: General Level of consciousness: awake and alert Pain management: pain level controlled Vital Signs Assessment: post-procedure vital signs reviewed and stable Respiratory status: spontaneous breathing, nonlabored ventilation, respiratory function stable and patient connected to nasal cannula oxygen Cardiovascular status: blood pressure returned to baseline and stable Postop Assessment: no apparent nausea or vomiting Anesthetic complications: no   No notable events documented.  Last Vitals:  Vitals:   10/09/21 1004  BP: 117/73  Resp: 14  Temp: (!) 36.4 C  SpO2: 100%    Last Pain:  Vitals:   10/09/21 1004  TempSrc: Oral  PainSc: 6                  Daisi Kentner S

## 2021-10-09 NOTE — Discharge Instructions (Addendum)
Please bring the stone fragments to the office.  You may use over the counter AZO per package instructions to help with stent irritation.    I have sent a few hydrocodone for more severe pain.     I will attempt to have the follow up appointment moved up so we can get the stent out in a week to 10 days.    [facility] [phone] Post Ureteroscopy With or Without Stent Instructions  Definitions:  Ureter: The duct that transports urine from the kidney to the bladder. Stent:   A plastic hollow tube that is placed into the ureter, from the kidney to the bladder to prevent the ureter from swelling shut.  GENERAL INSTRUCTIONS:  Despite the fact that no skin incisions were used, the area around the ureter and bladder is raw and irritated. The stent is a foreign body which will further irritate the bladder wall. This irritation is manifested by increased frequency of urination, both day and night, and by an increase in the urge to urinate. In some, the urge to urinate is present almost always. Sometimes the urge is strong enough that you may not be able to stop yourself from urinating. The only real cure is to remove the stent and then give time for the bladder wall to heal which can't be done until the danger of the ureter swelling shut has passed, which varies.  You may see some blood in your urine while the stent is in place and a few days afterwards. Do not be alarmed, even if the urine was clear for a while. Get off your feet and drink lots of fluids until clearing occurs. If you start to pass clots or don't improve, call us.  DIET: You may return to your normal diet immediately. Because of the raw surface of your bladder, alcohol, spicy foods, acid type foods and drinks with caffeine may cause irritation or frequency and should be used in moderation. To keep your urine flowing freely and to avoid constipation, drink plenty of fluids during the day ( 8-10 glasses ). Tip:  Avoid cranberry juice because it is very acidic.  ACTIVITY: Your physical activity doesn't need to be restricted. However, if you are very active, you may see some blood in your urine. We suggest that you reduce your activity under these circumstances until the bleeding has stopped.  BOWELS: It is important to keep your bowels regular during the postoperative period. Straining with bowel movements can cause bleeding. A bowel movement every other day is reasonable. Use a mild laxative if needed, such as Milk of Magnesia 2-3 tablespoons, or 2 Dulcolax tablets. Call if you continue to have problems. If you have been taking narcotics for pain, before, during or after your surgery, you may be constipated. Take a laxative if necessary.   MEDICATION: You should resume your pre-surgery medications unless told not to. In addition you will often be given an antibiotic to prevent infection. These should be taken as prescribed until the bottles are finished unless you are having an unusual reaction to one of the drugs.  PROBLEMS YOU SHOULD REPORT TO [place]: Fevers over 100.5 Fahrenheit. Heavy bleeding, or clots ( See above notes about blood in urine ). Inability to urinate. Drug reactions ( hives, rash, nausea, vomiting, diarrhea ). Severe burning or pain with urination that is not improving.  FOLLOW-UP: You will need a follow-up appointment to monitor your progress. Call for this appointment at the number listed above. Usually the first  appointment will be about three to fourteen days after your surgery.    Post Anesthesia Home Care Instructions  Activity: Get plenty of rest for the remainder of the day. A responsible individual must stay with you for 24 hours following the procedure.  For the next 24 hours, DO NOT: -Drive a car -Paediatric nurse -Drink alcoholic beverages -Take any medication unless instructed by your physician -Make any legal decisions or sign important papers.  Meals: Start  with liquid foods such as gelatin or soup. Progress to regular foods as tolerated. Avoid greasy, spicy, heavy foods. If nausea and/or vomiting occur, drink only clear liquids until the nausea and/or vomiting subsides. Call your physician if vomiting continues.  Special Instructions/Symptoms: Your throat may feel dry or sore from the anesthesia or the breathing tube placed in your throat during surgery. If this causes discomfort, gargle with warm salt water. The discomfort should disappear within 24 hours.

## 2021-10-09 NOTE — Transfer of Care (Signed)
Immediate Anesthesia Transfer of Care Note  Patient: Danielle Gaines  Procedure(s) Performed: CYSTOSCOPY WITH LEFT RETROGRADE URETEROSCOPY WITH HOLMIUM LASER AND STENT PLACEMENT (Left: Renal) HOLMIUM LASER APPLICATION (Left: Renal)  Patient Location: PACU  Anesthesia Type:General  Level of Consciousness: drowsy, patient cooperative and responds to stimulation  Airway & Oxygen Therapy: Patient Spontanous Breathing  Post-op Assessment: Report given to RN and Post -op Vital signs reviewed and stable  Post vital signs: Reviewed and stable  Last Vitals:  Vitals Value Taken Time  BP 124/72 10/09/21 1231  Temp    Pulse 81 10/09/21 1233  Resp 11 10/09/21 1233  SpO2 97 % 10/09/21 1233  Vitals shown include unvalidated device data.  Last Pain:  Vitals:   10/09/21 1004  TempSrc: Oral  PainSc: 6       Patients Stated Pain Goal: 3 (59/27/63 9432)  Complications: No notable events documented.

## 2021-10-09 NOTE — H&P (Signed)
I have kidney stones.  HPI: Danielle Gaines is a 80 year-old female established patient who is here for renal calculi.  09/15/2021: 80 year old female who underwent left-sided lithotripsy due to a 1 cm stone. For the past 2 weeks she has had persistent left-sided flank and back pain. This is not well controlled with tramadol. She denies fevers and chills. She has not passed any stone material. She denies gross hematuria.    Danielle Gaines is a 80 yo female who had a recent episode of right flank pain that has persisted for the last several months and had a CT in 4/22 that showed a 6mm calcification in the area of the right proximal ureter. There is no obstruction and on review of a KUB from 2020 and a CT from 2017 the calcification is stable and most consistent with a phlebolith. She had right RTG with stent by Dr. Louis Meckel in 2017 and the calcification was not in the ureter. There is a LLP stone that was 6 mm in 2017 and is now 63mm. It is non-obstructing. Her pain has been present for 2 months and it is variable but constant. The pain is in the right lower quadrant/groin area. She can have low back pain as well and her pain is aggravated by standing or prolonged sitting. She can have some pain on the left but it is not as bad. She has some fullness in the midabdominal area. 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 She has had no hematuria. She drinks 80-84oz of water daily but a low urine volume. She has loose frequent BM's. She is on topomax for her migraines.   The problem is on both sides. This is not her first kidney stone. She is currently having flank pain and back pain. She denies having groin pain, nausea, vomiting, fever, and chills. She has not caught a stone in her urine strainer since her symptoms began.   She has had eswl for treatment of her stones in the past.     ALLERGIES: No Allergies Sulfa Drugs    MEDICATIONS: Omeprazole 40 mg capsule,delayed release   Tamsulosin Hcl 0.4 mg capsule 1 capsule PO Daily  Cosopt  Estradiol  Folic Acid  Imitrex 109 mg tablet  Lasix 40 mg tablet  Lasix 40 mg tablet  Lidocaine 5 % ointment  Linzess  Patanol  Prolia 60 mg/ml syringe  Restasis Multidose 0.05 % drops  Retin-A  Sodium Bicarbonate  Topamax 100 mg tablet  Tylenol  Ubrelvy 100 mg tablet  Zenpep 15,000 unit-47,000 unit-63,000 unit capsule,delayed release     GU PSH: Cysto Remove Stent FB Sim - 2017 Cystoscopy Insert Stent - 2017 ESWL - 08/31/2021 Hysterectomy Unilat SO - 2017       PSH Notes: Cystoscopy With Insertion Of Ureteral Stent Right  , Cholecystotomy  , Colon Surgery with subtotal colectomy  , Appendectomy,  Ovarian Surgery,  Total Abdominal Hysterectomy   NON-GU PSH: Appendectomy - 2017 Cholecystectomy (open) - 2007 Hysterectomy - 1972 Incise Gallbladder - 2017     GU PMH: Flank Pain, The RUQ calcification is most consistent with a phlebolith and was not in the ureter on a retrograde in 2017 and remains otherwise unchanged. Her pain is most consistent with a musculoskeletal cause. - 08/24/2021, - 2017 Renal calculus, She has a slowly enlarging LLP stone that is now up to 47mm and is probably aggravated by the topomax, which she would really rather not stop. Since the stone has  grown progressively over the last 5 years, I think we need to consider treatment before it gets too big. I have discussed the options for treatment including ESWL, URS and PCNL. I think based on the stone appearance that it should fragment with ESWL but did explain that there is a 15% possibility of needing a secondary procedure. PCNL would have the highest stone free rate but with this particular stone the size is appropriate for ESWL or URS. I am going to get her set up for ESWL. I reviewed the risks of ESWL including bleeding, infection, injury to the kidney or adjacent structures, failure to fragment the stone, need for ancillary procedures,  thrombotic events, cardiac arrhythmias and sedation complications. - 08/24/2021, (Stable), - 2017, Nephrolithiasis, - 2017 Oth hydronephrosis - 2017      PMH Notes: Kidney failure   NON-GU PMH: Pyuria/other UA findings, She has WBC on UA and with the stone needing treatment, I will get a culture. - 08/24/2021 Anxiety, Anxiety and depression - 2017 Encounter for general adult medical examination without abnormal findings, Encounter for preventive health examination - 2017 Personal history of other diseases of the circulatory system, History of cardiac murmur - 2017 Personal history of other diseases of the musculoskeletal system and connective tissue, History of arthritis - 2017 Personal history of other specified conditions, History of heartburn - 2017 Arthritis Depression GERD    FAMILY HISTORY: 1 son - Other Cancer - Runs in Family cardiac disorder - Runs In Family Death In The Family Mother - Other heart - Mother kidney - Mother No Significant Family History - Runs in Family   SOCIAL HISTORY: Marital Status: Married Preferred Language: English; Ethnicity: Not Hispanic Or Latino; Race: White Current Smoking Status: Patient has never smoked.  Does not use smokeless tobacco. Does not use drugs. Does not drink caffeine. Has not had a blood transfusion.     Notes: Married, Alcohol use, Number of children, Never a smoker, Caffeine use   REVIEW OF SYSTEMS:    GU Review Female:   Patient reports frequent urination and hard to postpone urination. Patient denies burning /pain with urination, get up at night to urinate, leakage of urine, stream starts and stops, trouble starting your stream, have to strain to urinate, and being pregnant.  Gastrointestinal (Upper):   Patient reports nausea. Patient denies vomiting and indigestion/ heartburn.  Gastrointestinal (Lower):   Patient denies diarrhea and constipation.  Constitutional:   Patient reports fatigue. Patient denies fever, night  sweats, and weight loss.  Skin:   Patient denies skin rash/ lesion and itching.  Ears/ Nose/ Throat:   Patient denies sore throat and sinus problems.  Musculoskeletal:   Patient denies back pain and joint pain.  Neurological:   Patient denies headaches and dizziness.  Psychologic:   Patient denies depression and anxiety.   Notes: L flank pain Urgency not taking Flomax, unaware of rx     VITAL SIGNS:      09/15/2021 01:49 PM  BP 117/57 mmHg  Heart Rate 71 /min  Temperature 97.3 F / 36.2 C   GU PHYSICAL EXAMINATION:      Notes: Left-sided CVA tenderness.   MULTI-SYSTEM PHYSICAL EXAMINATION:    Constitutional: Well-nourished. No physical deformities. Normally developed. Good grooming.  Neck: Neck symmetrical, not swollen. Normal tracheal position.  Cardiovascular: Normal temperature, normal extremity pulses, no swelling, no varicosities.  Skin: No paleness, no jaundice, no cyanosis. No lesion, no ulcer, no rash.  Neurologic / Psychiatric: Oriented to time, oriented to  place, oriented to person. No depression, no anxiety, no agitation.     Complexity of Data:  Source Of History:  Patient  Records Review:   Previous Doctor Records, Previous Hospital Records, Previous Patient Records  Urine Test Review:   Urinalysis  X-Ray Review: KUB: Reviewed Films. Reviewed Report. Discussed With Patient.  C.T. Stone Protocol: Reviewed Films. Discussed With Patient.     09/15/21  Urinalysis  Urine Appearance Clear   Urine Color Yellow   Urine Glucose Neg mg/dL  Urine Bilirubin Neg mg/dL  Urine Ketones Neg mg/dL  Urine Specific Gravity 1.020   Urine Blood Neg ery/uL  Urine pH 5.5   Urine Protein Trace mg/dL  Urine Urobilinogen 0.2 mg/dL  Urine Nitrites Neg   Urine Leukocyte Esterase Trace leu/uL  Urine WBC/hpf 0 - 5/hpf   Urine RBC/hpf NS (Not Seen)   Urine Epithelial Cells 0 - 5/hpf   Urine Bacteria NS (Not Seen)   Urine Mucous Not Present   Urine Yeast NS (Not Seen)   Urine  Trichomonas Not Present   Urine Cystals NS (Not Seen)   Urine Casts NS (Not Seen)   Urine Sperm Not Present    PROCEDURES:         C.T. Urogram - [id number]      Patient confirmed No Neulasta OnPro Device.          KUB - [id number]  A single view of the abdomen is obtained. Bilateral renal shadows are visualized. Within the left renal shadow in the left lower pole previously noted opacity is no longer apparent. Tracing along the left ureter there is an opacity noted between the level of L4 and L5 as well as an opacity noted within the distal left ureter. These could be retained stone fragments or bowel content.      Patient confirmed No Neulasta OnPro Device.           Urinalysis w/Scope Dipstick Dipstick Cont'd Micro  Color: Yellow Bilirubin: Neg mg/dL WBC/hpf: 0 - 5/hpf  Appearance: Clear Ketones: Neg mg/dL RBC/hpf: NS (Not Seen)  Specific Gravity: 1.020 Blood: Neg ery/uL Bacteria: NS (Not Seen)  pH: 5.5 Protein: Trace mg/dL Cystals: NS (Not Seen)  Glucose: Neg mg/dL Urobilinogen: 0.2 mg/dL Casts: NS (Not Seen)    Nitrites: Neg Trichomonas: Not Present    Leukocyte Esterase: Trace leu/uL Mucous: Not Present      Epithelial Cells: 0 - 5/hpf      Yeast: NS (Not Seen)      Sperm: Not Present    Notes: microscopic not concentrated    ASSESSMENT:      ICD-10 Details  1 GU:   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 Stone Protocol Without I.V. Contrast  X-Ray Notes: History:  Hematuria: Yes/No  Patient to see MD after exam: Yes/No  Previous exam: CT / IVP/ US/ KUB/ None  When:  Where:  Diabetic: Yes/ No  BUN/ Creatinine:  Date of last BUN Creatinine:  Weight in pounds:  Allergy- IV Contrast: Yes/ No  Conflicting diabetic meds: Yes/ No  Diabetic  Meds:  Prior Authorization #: [employer]  Josem Kaufmann #[id number] Valid 09/15/21 thru 10/15/21            Schedule         Document Letter(s):  Created for Patient: Clinical Summary         Notes:   KUB was inconclusive today. Due to continued symptoms and lack of stone material passage, I advised CT imaging. CT scan was difficult to read due to previous bowel surgery. Final radiology read is pending. I did have on-call physician look over imaging. We were unable to appreciate opacities or hydronephrosis. Advised she continue the use of tamsulosin and this was given to her today and she may also use tramadol as needed for pain. We will follow-up with her regarding final radiology read. Looking at her CT scan however, left lower pole stone appears to be unchanged in size. She will likely need another procedure, due to the size and the location of the stone. As Dr. Jeffie Pollock had suggested, ureteroscopy or PCNL would be appropriate.

## 2021-10-12 ENCOUNTER — Encounter (HOSPITAL_BASED_OUTPATIENT_CLINIC_OR_DEPARTMENT_OTHER): Payer: Self-pay | Admitting: Urology

## 2021-10-13 ENCOUNTER — Ambulatory Visit: Payer: Medicare HMO | Admitting: Endocrinology

## 2021-10-28 DIAGNOSIS — N200 Calculus of kidney: Secondary | ICD-10-CM | POA: Diagnosis not present

## 2021-11-04 ENCOUNTER — Telehealth: Payer: Self-pay | Admitting: Endocrinology

## 2021-11-04 NOTE — Telephone Encounter (Signed)
Spoke with [name] and advised him that wife's next Prolia injection should be around 12/2021

## 2021-11-04 NOTE — Telephone Encounter (Signed)
Patient's husband [name] requests to be called at ph# [phone] re: When to schedule Patient's next Prolia injection

## 2021-11-05 ENCOUNTER — Ambulatory Visit: Payer: Medicare HMO | Admitting: Endocrinology

## 2021-11-24 DIAGNOSIS — H35342 Macular cyst, hole, or pseudohole, left eye: Secondary | ICD-10-CM | POA: Diagnosis not present

## 2021-11-24 DIAGNOSIS — Z9889 Other specified postprocedural states: Secondary | ICD-10-CM | POA: Diagnosis not present

## 2021-11-24 DIAGNOSIS — H18529 Epithelial (juvenile) corneal dystrophy, unspecified eye: Secondary | ICD-10-CM | POA: Diagnosis not present

## 2021-11-24 DIAGNOSIS — H3581 Retinal edema: Secondary | ICD-10-CM | POA: Diagnosis not present

## 2021-11-24 DIAGNOSIS — H44113 Panuveitis, bilateral: Secondary | ICD-10-CM | POA: Diagnosis not present

## 2021-11-24 DIAGNOSIS — H35371 Puckering of macula, right eye: Secondary | ICD-10-CM | POA: Diagnosis not present

## 2021-11-24 DIAGNOSIS — H30033 Focal chorioretinal inflammation, peripheral, bilateral: Secondary | ICD-10-CM | POA: Diagnosis not present

## 2021-11-24 DIAGNOSIS — Z961 Presence of intraocular lens: Secondary | ICD-10-CM | POA: Diagnosis not present

## 2021-11-24 DIAGNOSIS — H348332 Tributary (branch) retinal vein occlusion, bilateral, stable: Secondary | ICD-10-CM | POA: Diagnosis not present

## 2021-12-01 DIAGNOSIS — G43719 Chronic migraine without aura, intractable, without status migrainosus: Secondary | ICD-10-CM | POA: Diagnosis not present

## 2021-12-01 DIAGNOSIS — N1831 Chronic kidney disease, stage 3a: Secondary | ICD-10-CM | POA: Diagnosis not present

## 2021-12-08 DIAGNOSIS — H348332 Tributary (branch) retinal vein occlusion, bilateral, stable: Secondary | ICD-10-CM | POA: Diagnosis not present

## 2021-12-08 DIAGNOSIS — Z961 Presence of intraocular lens: Secondary | ICD-10-CM | POA: Diagnosis not present

## 2021-12-08 DIAGNOSIS — H35371 Puckering of macula, right eye: Secondary | ICD-10-CM | POA: Diagnosis not present

## 2021-12-08 DIAGNOSIS — H3581 Retinal edema: Secondary | ICD-10-CM | POA: Diagnosis not present

## 2021-12-08 DIAGNOSIS — H30033 Focal chorioretinal inflammation, peripheral, bilateral: Secondary | ICD-10-CM | POA: Diagnosis not present

## 2021-12-08 DIAGNOSIS — H18529 Epithelial (juvenile) corneal dystrophy, unspecified eye: Secondary | ICD-10-CM | POA: Diagnosis not present

## 2021-12-08 DIAGNOSIS — Z9889 Other specified postprocedural states: Secondary | ICD-10-CM | POA: Diagnosis not present

## 2021-12-08 DIAGNOSIS — H35342 Macular cyst, hole, or pseudohole, left eye: Secondary | ICD-10-CM | POA: Diagnosis not present

## 2021-12-08 DIAGNOSIS — H44113 Panuveitis, bilateral: Secondary | ICD-10-CM | POA: Diagnosis not present

## 2021-12-11 ENCOUNTER — Ambulatory Visit: Payer: Medicare HMO | Admitting: Endocrinology

## 2021-12-14 DIAGNOSIS — N1832 Chronic kidney disease, stage 3b: Secondary | ICD-10-CM | POA: Diagnosis not present

## 2021-12-18 ENCOUNTER — Ambulatory Visit: Payer: Medicare HMO | Admitting: Family Medicine

## 2021-12-21 ENCOUNTER — Encounter: Payer: Self-pay | Admitting: Family Medicine

## 2021-12-21 ENCOUNTER — Ambulatory Visit (INDEPENDENT_AMBULATORY_CARE_PROVIDER_SITE_OTHER): Payer: Medicare HMO | Admitting: Family Medicine

## 2021-12-21 VITALS — BP 110/68 | HR 73 | Temp 97.5°F | Ht 60.0 in | Wt 120.1 lb

## 2021-12-21 DIAGNOSIS — H3530 Unspecified macular degeneration: Secondary | ICD-10-CM | POA: Diagnosis not present

## 2021-12-21 DIAGNOSIS — H3581 Retinal edema: Secondary | ICD-10-CM | POA: Diagnosis not present

## 2021-12-21 DIAGNOSIS — R42 Dizziness and giddiness: Secondary | ICD-10-CM | POA: Diagnosis not present

## 2021-12-21 DIAGNOSIS — N1832 Chronic kidney disease, stage 3b: Secondary | ICD-10-CM

## 2021-12-21 DIAGNOSIS — G43809 Other migraine, not intractable, without status migrainosus: Secondary | ICD-10-CM

## 2021-12-21 NOTE — Patient Instructions (Signed)
[facility] bpv video  Half somersault maneuver by carol foster       Benign Positional Vertigo  Vertigo is the feeling that you or your surroundings are moving when they are not. Benign positional vertigo is the most common form of vertigo. The cause of this condition is not serious (is benign). This condition is triggered by certain movements and positions (is positional). This condition can be dangerous if it occurs while you are doing something that could endanger you or others, such as driving. What are the causes? In many cases, the cause of this condition is not known. It may be caused by a disturbance in an area of the inner ear that helps your brain to sense movement and balance. This disturbance can be caused by a viral infection (labyrinthitis), head injury, or repetitive motion. What increases the risk? This condition is more likely to develop in: Women. People who are 73 years of age or older.   What are the signs or symptoms? Symptoms of this condition usually happen when you move your head or your eyes in different directions. Symptoms may start suddenly, and they usually last for less than a minute. Symptoms may include: Loss of balance and falling. Feeling like you are spinning or moving. Feeling like your surroundings are spinning or moving. Nausea and vomiting. Blurred vision. Dizziness. Involuntary eye movement (nystagmus).   Symptoms can be mild and cause only slight annoyance, or they can be severe and interfere with daily life. Episodes of benign positional vertigo may return (recur) over time, and they may be triggered by certain movements. Symptoms may improve over time. How is this diagnosed? This condition is usually diagnosed by medical history and a physical exam of the head, neck, and ears. You may be referred to a health care provider who specializes in ear, nose, and throat (ENT) problems (otolaryngologist) or a provider who specializes in disorders of  the nervous system (neurologist). You may have additional testing, including: MRI. A CT scan. Eye movement tests. Your health care provider may ask you to change positions quickly while he or she watches you for symptoms of benign positional vertigo, such as nystagmus. Eye movement may be tested with an electronystagmogram (ENG), caloric stimulation, the Dix-Hallpike test, or the roll test. An electroencephalogram (EEG). This records electrical activity in your brain. Hearing tests.   How is this treated? Usually, your health care provider will treat this by moving your head in specific positions to adjust your inner ear back to normal. Surgery may be needed in severe cases, but this is rare. In some cases, benign positional vertigo may resolve on its own in 2-4 weeks. Follow these instructions at home: Safety Move slowly.Avoid sudden body or head movements. Avoid driving. Avoid operating heavy machinery. Avoid doing any tasks that would be dangerous to you or others if a vertigo episode would occur. If you have trouble walking or keeping your balance, try using a cane for stability. If you feel dizzy or unstable, sit down right away. Return to your normal activities as told by your health care provider. Ask your health care provider what activities are safe for you. General instructions Take over-the-counter and prescription medicines only as told by your health care provider. Avoid certain positions or movements as told by your health care provider. Drink enough fluid to keep your urine clear or pale yellow. Keep all follow-up visits as told by your health care provider. This is important. Contact a health care provider if: You  have a fever. Your condition gets worse or you develop new symptoms. Your family or friends notice any behavioral changes. Your nausea or vomiting gets worse. You have numbness or a pins and needles sensation. Get help right away if: You have difficulty  speaking or moving. You are always dizzy. You faint. You develop severe headaches. You have weakness in your legs or arms. You have changes in your hearing or vision. You develop a stiff neck. You develop sensitivity to light. This information is not intended to replace advice given to you by your health care provider. Make sure you discuss any questions you have with your health care provider. Document Released: 08/23/2006 Document Revised: 04/22/2016 Document Reviewed: 03/10/2015 Elsevier Interactive Patient Education  2018 [employer].    How to Perform the [place], [place] vertigo description and maneuver  The Epley maneuver is an exercise that relieves symptoms of vertigo. Vertigo is the feeling that you or your surroundings are moving when they are not. When you feel vertigo, you may feel like the room is spinning and have trouble walking. Dizziness is a little different than vertigo. When you are dizzy, you may feel unsteady or light-headed. You can do this maneuver at home whenever you have symptoms of vertigo. You can do it up to 3 times a day until your symptoms go away. Even though the Epley maneuver may relieve your vertigo for a few weeks, it is possible that your symptoms will return. This maneuver relieves vertigo, but it does not relieve dizziness. What are the risks? If it is done correctly, the Epley maneuver is considered safe. Sometimes it can lead to dizziness or nausea that goes away after a short time. If you develop other symptoms, such as changes in vision, weakness, or numbness, stop doing the maneuver and call your health care provider. How to perform the Epley maneuver Sit on the edge of a bed or table with your back straight and your legs extended or hanging over the edge of the bed or table. Turn your head halfway toward the affected ear or side. Lie backward quickly with your head turned until you are lying flat on your back. You may want to  position a pillow under your shoulders. Hold this position for 30 seconds. You may experience an attack of vertigo. This is normal. Turn your head to the opposite direction until your unaffected ear is facing the floor. Hold this position for 30 seconds. You may experience an attack of vertigo. This is normal. Hold this position until the vertigo stops. Turn your whole body to the same side as your head. Hold for another 30 seconds. Sit back up. You can repeat this exercise up to 3 times a day. Follow these instructions at home: After doing the Epley maneuver, you can return to your normal activities. Ask your health care provider if there is anything you should do at home to prevent vertigo. He or she may recommend that you: Keep your head raised (elevated) with two or more pillows while you sleep. Do not sleep on the side of your affected ear. Get up slowly from bed. Avoid sudden movements during the day. Avoid extreme head movement, like looking up or bending over. Contact a health care provider if: Your vertigo gets worse. You have other symptoms, including: Nausea. Vomiting. Headache. Get help right away if: You have vision changes. You have a severe or worsening headache or neck pain. You cannot stop vomiting. You have new  numbness or weakness in any part of your body. Summary Vertigo is the feeling that you or your surroundings are moving when they are not. The Epley maneuver is an exercise that relieves symptoms of vertigo. If the Epley maneuver is done correctly, it is considered safe. You can do it up to 3 times a day. This information is not intended to replace advice given to you by your health care provider. Make sure you discuss any questions you have with your health care provider. Document Released: 11/20/2013 Document Revised: 10/05/2016 Document Reviewed: 10/05/2016 Elsevier Interactive Patient Education  2017 [employer].

## 2021-12-21 NOTE — Progress Notes (Signed)
Danielle Gaines DOB: October 31, 1941 Encounter date: 12/21/2021  This is a 81 y.o. female who presents with Chief Complaint  Patient presents with   Dizziness    History of present illness: She brought kidney doc report - states she is now CKD 3b and is very worried about this. Has appointment next week. (Recent labs reviewed and scanned)  Has met with eye doc recently; discussion for new med treatment. She is very concerned about recommended treatment options.  It was suggested that she try CellCept at her last it was too worried about side effects and never tried this medication.  She does have it at home.  There is been discussion about trial of Humira.  She has read all the side effects with the immunosuppressive agents and is very worried about the potential for side effects with medication.  She has multiple eye conditions including peripheral focal chorioretinal inflammation of both eyes, retinal edema, macular hole (left eye), branch retinal vein occlusion bilaterally, macular pucker right eye, panuveitis bilateral.  Dizziness/spinning with walking/moving. Feels this every day. Gotten to point where it scares her (ie has 3 flights of stairs at home). States that she has felt this way for months. Initially thought vertigo from ear. Saw ENT and was told she had sinus infection. Got nasal spray from ENT, but this didn't help.   Does follow with neurology, Dr. Ninfa Meeker. They did cut back on topamax - states that this was to help with kidney function.her migraines have gotten worse. She is continuing to cut back. Has headache every day. She takes [place] daily; breaks in half and takes 2 tylenol with this when she takes it. That combination does seem to help with the headache.   She would like to see balance specialist, des[facility] at [facility].    When getting up has to be careful not to fall. Sometimes just won't get up because she knows she will fall. Husband will help her out if not feeling well. Some  days she walks better than others; just gets weak/fatigued. Drinks a lot of pedialyte.   Allergies  Allergen Reactions   Latanoprost     Other reaction(s): Other (See Comments) Severe burning.   Sulfa Antibiotics Swelling and Other (See Comments)    Reaction:  Unspecified swelling reaction    Current Meds  Medication Sig   acetaminophen (TYLENOL) 500 MG tablet Take 1,000 mg by mouth daily as needed for headache.   Apoaequorin (PREVAGEN PO) Take by mouth daily.   brimonidine (ALPHAGAN P) 0.1 % SOLN Apply to eye.   Calcium Carbonate (CALTRATE 600 PO) Take by mouth daily.   cycloSPORINE (RESTASIS) 0.05 % ophthalmic emulsion Place 1 drop into both eyes 2 (two) times daily.   denosumab (PROLIA) 60 MG/ML SOSY injection Inject 60 mg into the skin every 6 (six) months.   dorzolamide-timolol (COSOPT) 22.3-6.8 MG/ML ophthalmic solution Place 1 drop into both eyes 2 times daily.   Estradiol 10 MCG TABS vaginal tablet One tablet vaginally daily.   folic acid (FOLVITE) 1 MG tablet Take by mouth.   furosemide (LASIX) 40 MG tablet Take 1 tablet (40 mg total) by mouth daily. (from nephrology)   HYDROcodone-acetaminophen (NORCO/VICODIN) 5-325 MG tablet Take 1 tablet by mouth every 6 (six) hours as needed for severe pain.   LINZESS 290 MCG CAPS capsule Take 290 mcg by mouth daily before breakfast.    LORazepam (ATIVAN) 0.5 MG tablet Take 0.5 mg by mouth in the morning, at noon, and at bedtime. 1  in am 1 at midday 2 at hs   olopatadine (PATANOL) 0.1 % ophthalmic solution 1 drop 2 (two) times daily.   Probiotic Product (ALIGN PO) Take by mouth daily.   sodium bicarbonate 650 MG tablet TK 1 T PO BID   SUMAtriptan (IMITREX) 100 MG tablet Take 100 mg by mouth every 2 (two) hours as needed for migraine.    topiramate (TOPAMAX) 100 MG tablet Take 3 tablets at bedtime   traMADol (ULTRAM) 50 MG tablet Take 1-2 tablets (50-100 mg total) by mouth every 6 (six) hours as needed for moderate pain.   Travoprost, BAK  Free, (TRAVATAN) 0.004 % SOLN ophthalmic solution 1 drop at bedtime.   tretinoin (RETIN-A) 0.025 % cream APPLY A PEA SIZED AMOUNT TO FACE ONCE DAILY IN THE EVENING   UBRELVY 100 MG TABS    ZENPEP 15000 units CPEP Take 45,000 mg by mouth 2 (two) times daily.   [DISCONTINUED] Galcanezumab-gnlm (EMGALITY) 120 MG/ML SOAJ Inject into the skin.    Review of Systems  Constitutional:  Positive for fatigue. Negative for chills and fever.  Respiratory:  Negative for cough, chest tightness, shortness of breath and wheezing.   Cardiovascular:  Negative for chest pain, palpitations and leg swelling.   Objective:  BP 110/68 (BP Location: Left Arm, Patient Position: Sitting, Cuff Size: Normal)    Pulse 73    Temp (!) 97.5 F (36.4 C) (Oral)    Ht 5' (1.524 m)    Wt 120 lb 1.6 oz (54.5 kg)    SpO2 97%    BMI 23.46 kg/m   Weight: 120 lb 1.6 oz (54.5 kg)   BP Readings from Last 3 Encounters:  12/21/21 110/68  10/09/21 130/66  09/10/21 118/78   Wt Readings from Last 3 Encounters:  12/21/21 120 lb 1.6 oz (54.5 kg)  10/09/21 115 lb 6.4 oz (52.3 kg)  09/10/21 120 lb (54.4 kg)    Physical Exam Constitutional:      General: She is not in acute distress.    Appearance: She is well-developed.  Cardiovascular:     Rate and Rhythm: Normal rate and regular rhythm.     Heart sounds: Normal heart sounds. No murmur heard.   No friction rub.  Pulmonary:     Effort: Pulmonary effort is normal. No respiratory distress.     Breath sounds: Normal breath sounds. No wheezing or rales.  Musculoskeletal:     Right lower leg: No edema.     Left lower leg: No edema.  Neurological:     Mental Status: She is alert and oriented to person, place, and time.     Motor: No tremor or pronator drift.     Coordination: Romberg sign positive. Heel to [facility] Test abnormal. Impaired rapid alternating movements.     Gait: Gait abnormal (slow.patient is actively experiencing vertigo.) and tandem walk abnormal.  Psychiatric:         Behavior: Behavior normal.    Assessment/Plan   1. Vertigo She has followed with [facility] audiology in the past.  The provider that she wishes to see is an audiologist within the group that she has been seen in.  I have put in referral specifically for him.  She would like to try doing some balance therapy and exercises with him prior to initiating other evaluation.  She does regularly see neurology.  She did not mention her vertigo to them at her last visit.  If not improving with therapy through ENT/audiology, she can follow  back up with neurology. Although I did give her some information on exercises to help with inner ear, we discussed waiting for visit with audiology first to perform evaluation/exercises under supervision.  - Ambulatory referral to Audiology  2. Stage 3b chronic kidney disease ([place]) She is following regularly with nephrology.  Appointment is next week.  Labs have been scanned into the system.  Reassured her that people can stay at her present level of kidney function for years without difficulty.    3. Other migraine without status migrainosus, not intractable She is following with neurology. They are tapering topamax for renal function benefit. She has had more headaches as a result. Will need to follow closely with them for changes needed in med therapies.   4. Bilateral macular retinal edema Discussed proposed new medications from ophthalmology with the patient.  Although I am not an eye specialist, we discussed and reviewed her note from ophthalmology.  Per the documentation, it sounds that loss of vision is a real threat without intervention using one of the proposed categories of medication.  She does state that she does not want to lose her vision and states that she would do anything to maintain her vision.  We discussed that everybody handles medications differently, and just because there are side effects listed, does not mean that she will have all of those.   We also discussed that with immune suppressing agents sometimes it is some trial and error to find ones that work best with your system.  I encouraged her to keep communication open with ophthalmology through this process and encouraged her to follow their recommendations.  5. Macular degeneration, unspecified laterality, unspecified type See above.   Return if symptoms worsen or fail to improve.     Micheline Rough, MD

## 2021-12-22 NOTE — Telephone Encounter (Signed)
Verification of benefits initiated for 2023 [facility]

## 2021-12-23 DIAGNOSIS — D631 Anemia in chronic kidney disease: Secondary | ICD-10-CM | POA: Diagnosis not present

## 2021-12-23 DIAGNOSIS — N1832 Chronic kidney disease, stage 3b: Secondary | ICD-10-CM | POA: Diagnosis not present

## 2021-12-23 DIAGNOSIS — N2581 Secondary hyperparathyroidism of renal origin: Secondary | ICD-10-CM | POA: Diagnosis not present

## 2021-12-24 DIAGNOSIS — N200 Calculus of kidney: Secondary | ICD-10-CM | POA: Diagnosis not present

## 2021-12-29 NOTE — Telephone Encounter (Signed)
Pt ready for scheduling on or after 01/16/22  Out-of-pocket cost due at time of visit: $301  Primary: [employer] Prolia co-insurance: 20% (approximately $276) Admin fee co-insurance: 20% (approximately $25)  Secondary: n/a Prolia co-insurance:  Admin fee co-insurance:   Deductible: does not apply  Prior Auth: APPROVED PA# [id number] Valid: 05/16/20-11/28/22  ** This summary of benefits is an estimation of the patient's out-of-pocket cost. Exact cost may vary based on individual plan coverage.

## 2021-12-29 NOTE — Telephone Encounter (Signed)
Prior auth required for [employer] - approved  PA PROCESS DETAILS: Prior authorization is on file (Authorization # [id number]) and is valid from 05/16/2020 through 11/28/2022. Number of treatments covered No Limit. The prior authorization department can be contacted at [phone].

## 2022-01-05 DIAGNOSIS — H18529 Epithelial (juvenile) corneal dystrophy, unspecified eye: Secondary | ICD-10-CM | POA: Diagnosis not present

## 2022-01-05 DIAGNOSIS — H35371 Puckering of macula, right eye: Secondary | ICD-10-CM | POA: Diagnosis not present

## 2022-01-05 DIAGNOSIS — Z9889 Other specified postprocedural states: Secondary | ICD-10-CM | POA: Diagnosis not present

## 2022-01-05 DIAGNOSIS — H44113 Panuveitis, bilateral: Secondary | ICD-10-CM | POA: Diagnosis not present

## 2022-01-05 DIAGNOSIS — Z961 Presence of intraocular lens: Secondary | ICD-10-CM | POA: Diagnosis not present

## 2022-01-05 DIAGNOSIS — H348332 Tributary (branch) retinal vein occlusion, bilateral, stable: Secondary | ICD-10-CM | POA: Diagnosis not present

## 2022-01-05 DIAGNOSIS — H30033 Focal chorioretinal inflammation, peripheral, bilateral: Secondary | ICD-10-CM | POA: Diagnosis not present

## 2022-01-05 DIAGNOSIS — H35342 Macular cyst, hole, or pseudohole, left eye: Secondary | ICD-10-CM | POA: Diagnosis not present

## 2022-01-05 DIAGNOSIS — H3581 Retinal edema: Secondary | ICD-10-CM | POA: Diagnosis not present

## 2022-01-19 ENCOUNTER — Ambulatory Visit: Payer: Medicare HMO

## 2022-01-25 ENCOUNTER — Telehealth: Payer: Self-pay | Admitting: Family Medicine

## 2022-01-25 NOTE — Telephone Encounter (Signed)
Left message for patient to call back and schedule Medicare Annual Wellness Visit (AWV) either virtually or in office. Left  my Danielle Gaines number [phone]   Last AWV 08/18/17  please schedule at anytime with [facility] Nurse Health Advisor 1 or 2   This should be a 45 minute visit.

## 2022-01-25 NOTE — Telephone Encounter (Signed)
Spouse returned my call.  He stated they had a lot of people in their house and would call me back to schedule

## 2022-01-25 NOTE — Telephone Encounter (Signed)
error 

## 2022-02-03 ENCOUNTER — Ambulatory Visit: Payer: Medicare HMO

## 2022-02-04 NOTE — Telephone Encounter (Signed)
Appt 02/10/22 ?

## 2022-02-10 ENCOUNTER — Other Ambulatory Visit: Payer: Self-pay

## 2022-02-10 ENCOUNTER — Ambulatory Visit: Payer: Medicare HMO

## 2022-02-10 DIAGNOSIS — M810 Age-related osteoporosis without current pathological fracture: Secondary | ICD-10-CM

## 2022-02-10 MED ORDER — DENOSUMAB 60 MG/ML ~~LOC~~ SOSY
60.0000 mg | PREFILLED_SYRINGE | Freq: Once | SUBCUTANEOUS | Status: AC
  Administered 2022-02-10: 60 mg via SUBCUTANEOUS

## 2022-02-10 NOTE — Progress Notes (Signed)
Patient seen today for Prolia injection.  60mg/ml given in left arm subQ.  Patient tolerated well.  Follow up 6 months or as advised by provider. 

## 2022-02-11 NOTE — Telephone Encounter (Signed)
Last Prolia inj 02/10/22 ?Next Prolia inj due 08/14/22 ?

## 2022-02-23 ENCOUNTER — Ambulatory Visit: Payer: Medicare HMO | Admitting: Orthopaedic Surgery

## 2022-02-26 DIAGNOSIS — H5500 Unspecified nystagmus: Secondary | ICD-10-CM | POA: Diagnosis not present

## 2022-02-26 DIAGNOSIS — R42 Dizziness and giddiness: Secondary | ICD-10-CM | POA: Diagnosis not present

## 2022-03-10 ENCOUNTER — Encounter (HOSPITAL_COMMUNITY): Payer: Self-pay | Admitting: *Deleted

## 2022-03-10 ENCOUNTER — Other Ambulatory Visit: Payer: Self-pay

## 2022-03-10 ENCOUNTER — Ambulatory Visit (HOSPITAL_COMMUNITY)
Admission: EM | Admit: 2022-03-10 | Discharge: 2022-03-10 | Disposition: A | Discharge status: Home / Self Care | Payer: Medicare HMO | Attending: Family Medicine | Admitting: Family Medicine

## 2022-03-10 DIAGNOSIS — R112 Nausea with vomiting, unspecified: Secondary | ICD-10-CM

## 2022-03-10 DIAGNOSIS — R42 Dizziness and giddiness: Secondary | ICD-10-CM

## 2022-03-10 MED ORDER — ONDANSETRON 4 MG PO TBDP: 4.0000 mg | ORAL_TABLET | Freq: Three times a day (TID) | ORAL | 0 refills | Status: DC | PRN

## 2022-03-10 NOTE — ED Triage Notes (Signed)
Pt reports Sx's for 1 month . Pt reports vertigo and head ache. Pt reports the temples RT/LT hurt when touched. Pt has not been to her PCP fo rSX's. ?

## 2022-03-10 NOTE — Discharge Instructions (Addendum)
Ondansetron dissolved in the mouth every 8 hours as needed for nausea or vomiting. ? ?Please call your neurologist so they can evaluate your vertigo. ? ? ?

## 2022-03-10 NOTE — ED Notes (Signed)
This writer had to stop irrigation to RT ear due to pain. DR Windy Carina informed unable to remove ear wax.  ?

## 2022-03-10 NOTE — ED Provider Notes (Signed)
?[facility] ? ? ? ?CSN: [id number] ?Arrival date & time: 03/10/22  1352 ? ? ?  ? ?History   ?Chief Complaint ?Chief Complaint  ?Patient presents with  ? Dizziness  ? Headache  ? Otalgia  ?  Lt/RT  ? ? ?HPI ?DORANN DAVIDSON is a 81 y.o. female.  ? ? ?Dizziness ?Associated symptoms: headaches   ?Headache ?Associated symptoms: dizziness and ear pain   ?Otalgia ?Associated symptoms: headaches   ?Here for persistent vertigo with nausea that is been going on for at least 3 to 4 months.  She has been evaluated by her primary care office, and audiology and balance center.  I can see some of the testing in epic, and they are planning on doing more.  There may be a central component to this particular according to the report. ? ?She also has some headaches and bitemporal pain coming and going.  She reports a history of migraines, and sees a neurologist already this ?She is already taking meclizine persistently. ? ?She reports she saw an ENT 1-2 times, and was given flonase, and then steroids. After the steroids she improved a little, but then symptoms returned. She did not f/u with that doctor again. ? ?She states her PCP is moving away, and she doesn't want to see anybody else in that practice until the new doctor comes. ? ? ?Past Medical History:  ?Diagnosis Date  ? Anemia   ? Anxiety   ? Arthritis   ? Benzodiazepine dependence ([place])   ? Carotid arterial disease ([place])   ? mild  ? Chronic kidney disease (CKD), stage III (moderate) ([place]) 08/01/2018  ? -seeing  Nephrology, Dr. 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 Chronic pain   ? managed by pain clinic  ? Colon abnormality   ? didn't work right so part of it was removed at [facility]  ? Depression   ? managed by Dr. Toy Care  ? Diastolic heart failure ([place])   ? mild on echo 2016 no cardiologist  ? GERD (gastroesophageal reflux disease)   ? IBS (irritable bowel syndrome)   ? sees Dr. Earlean Shawl  ? Macular degeneration   ? goes to [facility] for this   ? Migraine   ? managed by neurologist  ? Osteoporosis   ? Sty   ? right eye  ? Uvulitis   ? Vertigo   ? seeing  dr bates for lov 08-11-2021 epic  ? Wears glasses   ? for reading  ? Wears hearing aid in both ears   ? ? ?Patient Active Problem List  ? Diagnosis Date Noted  ? Contact dermatitis 09/10/2021  ? Hand eczema 09/10/2021  ? Elective procedure for unacceptable cosmetic appearance 09/10/2021  ? Lipoma 09/10/2021  ? Peripheral venous insufficiency 09/10/2021  ? Pruritus, unspecified 09/10/2021  ? Low back pain 07/16/2021  ? Hip pain, bilateral 06/11/2021  ? Myalgia 04/08/2021  ? Vasomotor rhinitis 11/04/2020  ? Referred otalgia of both ears 07/08/2020  ? Right ear pain 05/09/2020  ? Bilateral impacted cerumen 09/13/2019  ? Excessive sweating 06/13/2019  ? Anterior epistaxis 08/14/2018  ? Aphthous ulcer 08/14/2018  ? Dysfunction of both eustachian tubes 08/14/2018  ? Recurrent epistaxis 08/14/2018  ? Chronic kidney disease (CKD), stage III (moderate) ([place]) 08/01/2018  ? Anemia, chronic renal failure 08/01/2018  ? Episcleritis of left eye 02/23/2018  ? New daily persistent headache 01/10/2018  ? Temporal arteritis ([place]) 01/10/2018  ? Bilateral temporomandibular joint pain 01/10/2018  ?  Constipation 10/18/2017  ? 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 Hearing loss 09/01/2017  ? Tremor of both hands 09/01/2017  ? Dizziness 09/01/2017  ? Panuveitis of both eyes 02/01/2017  ? Chronic pain 11/19/2016  ? Orthostatic hypotension 11/19/2016  ? Retinal edema 01/13/2016  ? Epiretinal membrane (ERM) of right eye 12/30/2015  ? 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 Osteoporosis 07/24/2014  ? Cystoid macular edema, right eye 07/05/2014  ? 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 Migraine - followed by Dr. Sima Matas in Neurology 05/24/2014  ? Anxiety and depression - managed at [facility] 05/24/2014  ? GERD (gastroesophageal reflux disease)  05/24/2014  ? Macular degeneration - goes to [facility] for this 05/24/2014  ? Central retinal vein occlusion 12/07/2013  ? Cystoid macular edema of left eye 12/07/2013  ? 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 Esotropia 11/10/2012  ? Pseudophakia 11/10/2012  ? Status post LASIK surgery 11/10/2012  ? 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 Chronic migraine without aura 09/17/2012  ? ? ?Past Surgical History:  ?Procedure Laterality Date  ? ABDOMINAL ADHESION SURGERY    ? ABDOMINAL HYSTERECTOMY  1972  ? APPENDECTOMY    ? CHOLECYSTECTOMY  1996  ? colonscopy  1989  ? CYSTOSCOPY W/ URETERAL STENT PLACEMENT Right 04/27/2016  ? Procedure: CYSTOSCOPY WITH RETROGRADE PYELOGRAM/ RIGHT URETERAL STENT PLACEMENT;  Surgeon: Ardis Hughs, MD;  Location: [facility];  Service: Urology;  Laterality: Right;  ? CYSTOSCOPY WITH RETROGRADE PYELOGRAM, URETEROSCOPY AND STENT PLACEMENT Left 10/09/2021  ? Procedure: CYSTOSCOPY WITH LEFT RETROGRADE URETEROSCOPY WITH HOLMIUM LASER AND STENT PLACEMENT;  Surgeon: Irine Seal, MD;  Location[facility];  Service: Urology;  Laterality: Left;  ? ESOPHAGOGASTRODUODENOSCOPY    ? 2002, 2011, 2014, 2021  ? EXTRACORPOREAL SHOCK WAVE LITHOTRIPSY Left 08/31/2021  ? Procedure: LEFT EXTRACORPOREAL SHOCK WAVE LITHOTRIPSY (ESWL);  Surgeon: Ardis Hughs, MD;  Location[facility];  Service: Urology;  Laterality: Left;  ? [place], 2006, 2011, 2021  ? HOLMIUM LASER APPLICATION Left 57/11/7791  ? Procedure: HOLMIUM LASER APPLICATION;  Surgeon: Irine Seal, MD;  Location: [facility];  Service: Urology;  Laterality: Left;  ? lasix eye surgery    ? ou  ? MEMBRANECTOMY Left 09/21/2013  ? OVARIAN CYST SURGERY    ? PARS PLANA VITRECTOMY Left 09/21/2013  ? SMALL INTESTINE SURGERY    ? SUBTOTAL COLECTOMY    ? 1991, 1994, 2006, 2011  ? tumor removal right shoulder  06/22/2021   ? ? ?OB History   ? ? Gravida  ?1  ? Para  ?1  ? Term  ?0  ? Preterm  ?0  ? AB  ?0  ? Living  ?1  ?  ? ? SAB  ?0  ? IAB  ?0  ? Ectopic  ?0  ? Multiple  ?0  ? Live Births  ?0  ?   ?  ?  ? ? ? ?Home Medications   ? ?Prior to Admission medications   ?Medication Sig Start Date End Date Taking? Authorizing Provider  ?ondansetron (ZOFRAN-ODT) 4 MG disintegrating tablet Take 1 tablet (4 mg total) by mouth every 8 (eight) hours as needed for nausea or vomiting. 03/10/22  Yes Oakleigh Hesketh, Gwenlyn Perking, MD  ?acetaminophen (TYLENOL) 500 MG tablet Take 1,000 mg by mouth daily as needed for headache.    [provider]  ?Apoaequorin (PREVAGEN PO) Take by mouth daily.    [provider]  ?brimonidine (ALPHAGAN P) 0.1 % SOLN Apply to eye. 06/18/21   [provider]  ?Calcium Carbonate (CALTRATE 600 PO) Take by mouth daily.    [provider]  ?cycloSPORINE (RESTASIS) 0.05 % ophthalmic emulsion Place 1 drop into both eyes 2 (two) times daily. 10/09/19   [provider]  ?denosumab (PROLIA) 60 MG/ML SOSY injection Inject 60 mg into the skin every 6 (six) months.    [provider]  ?dorzolamide-timolol (COSOPT) 22.3-6.8 MG/ML ophthalmic solution Place 1 drop into both eyes 2 times daily. 10/09/19   [provider]  ?Estradiol 10 MCG TABS vaginal tablet One tablet vaginally daily. 09/04/20   Salvadore Dom, MD  ?folic acid (FOLVITE) 1 MG tablet Take by mouth. 12/21/19   [provider]  ?furosemide (LASIX) 40 MG tablet Take 1 tablet (40 mg total) by mouth daily. (from nephrology) 06/28/19   Caren Macadam, MD  ?HYDROcodone-acetaminophen (NORCO/VICODIN) 5-325 MG tablet Take 1 tablet by mouth every 6 (six) hours as needed for severe pain. 10/09/21 10/09/22  Irine Seal, MD  ?Rolan Lipa 290 MCG CAPS capsule Take 290 mcg by mouth daily before breakfast.     [provider]  ?LORazepam (ATIVAN) 0.5 MG tablet Take 0.5 mg by mouth in the morning, at noon, and at  bedtime. 1 in am ?1 at midday ?2 at hs    [provider]  ?olopatadine (PATANOL) 0.1 % ophthalmic solution 1 drop 2 (two) times daily.    [provider]  ?Probiotic Product (ALIGN PO) Take by

## 2022-03-11 ENCOUNTER — Telehealth: Payer: Self-pay | Admitting: Family Medicine

## 2022-03-11 NOTE — Telephone Encounter (Signed)
Spoke to spouse to schedule Medicare Annual Wellness Visit (AWV) either virtually or in office. Left  my Herbie Drape number [phone] ? ? ?He will check calendar and call back to schedule ? ?Last AWV ;08/18/17 ? please schedule at anytime with [facility] Nurse Health Advisor 1 or 2 ? ? ? ?

## 2022-03-16 DIAGNOSIS — H44113 Panuveitis, bilateral: Secondary | ICD-10-CM | POA: Diagnosis not present

## 2022-03-16 DIAGNOSIS — Z9889 Other specified postprocedural states: Secondary | ICD-10-CM | POA: Diagnosis not present

## 2022-03-16 DIAGNOSIS — H35371 Puckering of macula, right eye: Secondary | ICD-10-CM | POA: Diagnosis not present

## 2022-03-16 DIAGNOSIS — H30033 Focal chorioretinal inflammation, peripheral, bilateral: Secondary | ICD-10-CM | POA: Diagnosis not present

## 2022-03-16 DIAGNOSIS — H35342 Macular cyst, hole, or pseudohole, left eye: Secondary | ICD-10-CM | POA: Diagnosis not present

## 2022-03-16 DIAGNOSIS — H18529 Epithelial (juvenile) corneal dystrophy, unspecified eye: Secondary | ICD-10-CM | POA: Diagnosis not present

## 2022-03-16 DIAGNOSIS — H348332 Tributary (branch) retinal vein occlusion, bilateral, stable: Secondary | ICD-10-CM | POA: Diagnosis not present

## 2022-03-16 DIAGNOSIS — Z961 Presence of intraocular lens: Secondary | ICD-10-CM | POA: Diagnosis not present

## 2022-03-16 DIAGNOSIS — H3581 Retinal edema: Secondary | ICD-10-CM | POA: Diagnosis not present

## 2022-03-23 ENCOUNTER — Ambulatory Visit (INDEPENDENT_AMBULATORY_CARE_PROVIDER_SITE_OTHER): Payer: Medicare HMO

## 2022-03-23 ENCOUNTER — Ambulatory Visit: Payer: Medicare HMO | Admitting: Orthopaedic Surgery

## 2022-03-23 ENCOUNTER — Encounter: Payer: Self-pay | Admitting: Orthopaedic Surgery

## 2022-03-23 DIAGNOSIS — G8929 Other chronic pain: Secondary | ICD-10-CM

## 2022-03-23 DIAGNOSIS — M542 Cervicalgia: Secondary | ICD-10-CM

## 2022-03-23 DIAGNOSIS — M545 Low back pain, unspecified: Secondary | ICD-10-CM

## 2022-03-23 DIAGNOSIS — M47812 Spondylosis without myelopathy or radiculopathy, cervical region: Secondary | ICD-10-CM | POA: Diagnosis not present

## 2022-03-23 NOTE — Progress Notes (Signed)
? ?Office Visit Note ?  ?Patient: Danielle Gaines           ?Date of Birth: Mar 30, 1941           ?MRN: [id number] ?Visit Date: 03/23/2022 ?             ?Requested by: Caren Macadam, MD ?[address] ?[place],  [place] [address] ?PCP: Caren Macadam, MD ? ? ?Assessment & Plan: ?Visit Diagnoses:  ?1. Neck pain   ?2. Chronic bilateral low back pain without sciatica   ?3. Spondylosis of cervical region without myelopathy or radiculopathy   ? ? ?Plan: Danielle Gaines is accompanied by her husband and is evaluated for chronic problems with the cervical and lumbar spine.  Both areas are "stiff" but not associated with any referred pain to either upper or lower extremity.  She has had a prior MRI scan of the lumbar spine last year that demonstrated some degenerative changes but no evidence of stenosis.  We had suggested a course of physical therapy but she preferred to do the exercises on her own.  She has not had any injury or trauma.  Presently she is having more trouble with her cervical spine with considerable stiffness.  She has diffuse and advanced degenerative changes throughout the cervical spine beginning about C3.  Long discussion regarding all of the above.  I think a course of physical therapy would be very helpful she is willing to perform these at [facility] facility.  We will set this up.  Like to see her back in 4 to 6 weeks if no improvement consider an MRI scan of the cervical spine.  She could be a candidate for facet injections.  She also notes that on occasion she has had some numbness and tingling in her hands.  I think she has carpal tunnel as she does have a positive Tinel's over the median nerve with some intrinsic atrophy.  I do not think this is related to her neck.  It presently is not a significant problem so we will evaluate that at some point in the future. ? ?Follow-Up Instructions: Return in about 6 weeks (around 05/04/2022).  ? ?Orders:  ?Orders Placed This Encounter   ?Procedures  ? XR Lumbar Spine 2-3 Views  ? XR Cervical Spine 2 or 3 views  ? Ambulatory referral to Physical Therapy  ? ?No orders of the defined types were placed in this encounter. ? ? ? ? Procedures: ?No procedures performed ? ? ?Clinical Data: ?No additional findings. ? ? ?Subjective: ?Chief Complaint  ?Patient presents with  ? Lower Back - Pain  ? Neck - Pain  ?Patient presents today for lower back and neck pain. She was here last year for lower back pain. It was recommended for her to try physical therapy, but she states that she never did it. She has pain in her neck that is worse with trying to turn her head. The left side is worse. She does have some numbness in her hands, worse in the right hand. She takes Tylenol as needed.  Numbness seems to be worse when she drives a car when she is sleeping at night.  She cannot make her "arms hurt when I move my neck".  She has been evaluated for back problems in the past with an MRI scan 2022 which I reviewed with her.  She does have a history of chronic renal disease and is unable to take NSAIDs ? ?HPI ? ?Review of  Systems ? ? ?Objective: ?Vital Signs: There were no vitals taken for this visit. ? ?Physical Exam ?Constitutional:   ?   Appearance: She is well-developed.  ?Pulmonary:  ?   Effort: Pulmonary effort is normal.  ?Skin: ?   General: Skin is warm and dry.  ?Neurological:  ?   Mental Status: She is alert and oriented to person, place, and time.  ?Psychiatric:     ?   Behavior: Behavior normal.  ? ? ?Ortho Exam awake alert and oriented x3.  Comfortable sitting.  Does wear hearing aids.  Has limited range of motion of the cervical spine.  Was able to barely touch her chin to her chest but only about 30% of neck extension from a neutral position.  Today she only had about 40% of motion to the right to the left and rotation.  No maneuver of her neck created shoulder or upper extremity pain.  Good upper extremity strength.  Does have positive Tinel over the  median nerve at both wrists with some intrinsic atrophy.  She was able to oppose her thumb to little finger. ? ?Straight leg raise negative.  Reflexes appear to be symmetrical to both upper and lower extremities.  Very minimal percussible tenderness to lower lumbar spine.  No pain in the upper lumbar spine or thoracic area.  No flank pain. ? ?Specialty Comments:  ?No specialty comments available. ? ?Imaging: ?XR Cervical Spine 2 or 3 views ? ?Result Date: 03/23/2022 ?Films of the cervical spine were obtained in 2 projections.  There is some mild straightening of the normal lordotic curve but no evidence of scoliosis or listhesis.  There are diffuse degenerative changes throughout the lower cervical spine with degenerative disc changes and narrowing as well as facet sclerosis.  No acute changes.  Majority of the changes in the lower cervical spine but degenerative changes began about C3 and extend through C7 ? ?XR Lumbar Spine 2-3 Views ? ?Result Date: 03/23/2022 ?Films lumbar spine obtained in 2 projections i.e. AP and lateral.  No listhesis.  There are some degenerative changes at the facet joints and disc spaces at L1-2.  No evidence of compression fracture.  Facet changes are also identified at L5-S1.  The spaces appear to be well-maintained.  Majority of patient's pain is in the lower lumbar spine.  ? ? ?PMFS History: ?Patient Active Problem List  ? Diagnosis Date Noted  ? 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  ? Contact dermatitis 09/10/2021  ? Hand eczema 09/10/2021  ? Elective procedure for unacceptable cosmetic appearance 09/10/2021  ? Lipoma 09/10/2021  ? Peripheral venous insufficiency 09/10/2021  ? Pruritus, unspecified 09/10/2021  ? Low back pain 07/16/2021  ? Hip pain, bilateral 06/11/2021  ? Myalgia 04/08/2021  ? Vasomotor rhinitis 11/04/2020  ? Referred otalgia of both ears 07/08/2020  ? Right ear pain 05/09/2020  ? Bilateral impacted cerumen 09/13/2019  ? Excessive sweating 06/13/2019  ? Anterior  epistaxis 08/14/2018  ? Aphthous ulcer 08/14/2018  ? Dysfunction of both eustachian tubes 08/14/2018  ? Recurrent epistaxis 08/14/2018  ? Chronic kidney disease (CKD), stage III (moderate) ([place]) 08/01/2018  ? Anemia, chronic renal failure 08/01/2018  ? Episcleritis of left eye 02/23/2018  ? New daily persistent headache 01/10/2018  ? Temporal arteritis ([place]) 01/10/2018  ? Bilateral temporomandibular joint pain 01/10/2018  ? Constipation 10/18/2017  ? 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 Hearing loss 09/01/2017  ? Tremor of both hands 09/01/2017  ? Dizziness 09/01/2017  ? Panuveitis of both  eyes 02/01/2017  ? Chronic pain 11/19/2016  ? Orthostatic hypotension 11/19/2016  ? Retinal edema 01/13/2016  ? Epiretinal membrane (ERM) of right eye 12/30/2015  ? MDD (major depressive disorder), recurrent severe, without psychosis ([place]) 04/09/2015  ? Osteoporosis 07/24/2014  ? Cystoid macular edema, right eye 07/05/2014  ? 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 Migraine - followed by Dr. Sima Matas in Neurology 05/24/2014  ? Anxiety and depression - managed at [facility] 05/24/2014  ? GERD (gastroesophageal reflux disease) 05/24/2014  ? Macular degeneration - goes to [facility] for this 05/24/2014  ? Central retinal vein occlusion 12/07/2013  ? Cystoid macular edema of left eye 12/07/2013  ? 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 Esotropia 11/10/2012  ? Pseudophakia 11/10/2012  ? Status post LASIK surgery 11/10/2012  ? 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 Chronic migraine without aura 09/17/2012  ? ?Past Medical History:  ?Diagnosis Date  ? Anemia   ? Anxiety   ? Arthritis   ? Benzodiazepine dependence ([place])   ? Carotid arterial disease ([place])   ? mild  ? Chronic kidney disease (CKD), stage III (moderate) ([place]) 08/01/2018  ? -seeing  Nephrology, Dr. 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 Chronic pain   ? managed  by pain clinic  ? Colon abnormality   ? didn't work right so part of it was removed at [facility]  ? Depression   ? managed by Dr. Toy Care  ? Diastolic heart failure ([place])   ? mild on echo 2016 no cardiologist

## 2022-03-31 ENCOUNTER — Ambulatory Visit: Payer: Medicare HMO

## 2022-04-02 ENCOUNTER — Telehealth: Payer: Self-pay | Admitting: Family Medicine

## 2022-04-02 ENCOUNTER — Telehealth: Payer: Self-pay

## 2022-04-02 NOTE — Telephone Encounter (Signed)
--  Call states his wife had a fall yesterday, she is ?having a lot of dizziness and lightheadedness. This ?began about a month or so ago. She was taken to UC ?and they didn't do anything for her and she still has the ?dizzy spells. She was taken to UC about 2 weeks ago. ?They washed her ears and some water got in her ear ?and she still feels like it is in her. ? ?04/01/2022 5:02:42 PM Go to ED Now (or PCP triage) [facility] [place], [place], [place] ? ?Comments ?User: Jodelle Green, RN Date/Time Eilene Ghazi Time): 04/01/2022 4:51:02 PM ?Hx of migraines, ?User: Jodelle Green, RN Date/Time Eilene Ghazi Time): 04/01/2022 5:06:59 PM ?Attempted to call back line and office before 5 but was not able to get through to anyone in the office. Husband ?states wife will not go to seek care tonight because she had a horrible experience in the Urgent Care recently and if ?she cannot see her PCP, she does not want to go anywhere. Encouraged him to take her in tonight. ? ?Referrals ?GO TO FACILITY REFUSED ? ?04/02/22 1005 - LVM for pt to call office to schedule appt with any available provider here or in [facility] system.  ?Of note: pt was seen in ED on 03/10/22, see note below: ?She wanted to know if I could do anything stronger for the vertigo or the nausea. I will send in some zofran for her. ?I asked her to please call her neurologist so they can evaluate her. Discussed she may need imaging of her brain/head. ?Ear lavage done to see if will give her any benefit ? ?Pt has neurology appt scheduled on 05/18/22 ? ?

## 2022-04-02 NOTE — Telephone Encounter (Signed)
Pt husband came to the office looking to see if Dr. had some available appts. Let him know as of now there are no appts available; offered to sched her with other providers and he stated that she refused. He took her to urgent care and he believes they made it worst. He stated he will call back if pt changes her mind.  ? ?Please advise.  ?

## 2022-04-02 NOTE — Telephone Encounter (Signed)
Spoke with Danielle Gaines and offered an appt with Dr Martinique on Monday morning as PCP does not have any openings soon.  Danielle Gaines declined as the appt was too early.  Another appt was offered and scheduled for 5/9 at 3pm. ?

## 2022-04-05 NOTE — Progress Notes (Signed)
? ?Chief Complaint  ?Patient presents with  ? Follow-up  ?  From urgent care; had ear lavage in right ear, dizziness became worse after that. Had immediate pain once they started cleaning the right ear. They stopped & said pt needed to see an ENT; dizziness has gotten worse since then. UC visit was 03/10/22.  ? ?HPI: ?Danielle Gaines is a 81 y.o. female, who is here today with her husband to follow on recent urgent care visit. Her PCP is not available at this time. ?She has multiple complaints today. ? ?She was evaluated in acute care for cerumen impaction as descried above. ?"Cannot walk straight." ?Spinning sensation when she turns head in bed, with certain movements, and getting up. ?Bilateral tinnitus, intermittent for years. ?Bitemporal pressure headache, hx of migraine headaches and TMJ joint pain.. ? ?No associated visual changes,focal weakness,or MS changes. ? ?She was seen at [facility] on 03/10/22 for vertigo, nausea and vomiting.  ?Reports hx of vertigo for years. ?She has not tried medication before. ? ?She also mentions that she has had intermittent fever for the past couple months. ?Temp 99-101 F. Last temp 100 a couple days ago. ?Chills "all the time." ?Last time she took Tylenol was 3 hours ago. ?No sick contact or recent travel. ?Non productive cough for a few weeks. No associated SOB or wheezing. ? ?Lab Results  ?Component Value Date  ? TSH 2.350 09/03/2020  ? ?She is also concerned about her renal function. ?Decreased urination and back pain. ?Has voided 4 times today, which is less than her usual. ?She has hx of back pain and OA, follows with ortho. ? ?CKD III: Denies dysuria,foam in urina, or gross hematuria. ?Negative for PND,orthopnea,or edema ?Had labs done on 03/16/22. ?Sodium 135 - 146 MMOL/L 137    ?Potassium 3.5 - 5.3 MMOL/L 3.6  NO VISIBLE HEMOLYSIS  ?Chloride 98 - 110 MMOL/L 108    ?CO2 21 - 31 MMOL/L 18 Low     ?BUN 8 - 24 MG/DL 27 High     ?Glucose 70 - 99 MG/DL 96  Patients taking  eltrombopag at doses >/= 100 mg daily may show falsely elevated values of 10% or greater.  ?Creatinine 0.60 - 1.20 MG/DL 1.57 High     ?Calcium 8.5 - 10.5 MG/DL 9.6    ?Total Protein 6.4 - 8.9 G/DL 8.4    ? ?WBC 4.4 - 11.0 x 10*3/uL 6.5   ?RBC 4.10 - 5.10 x 10*6/uL 4.16   ?Hemoglobin 12.3 - 15.3 G/DL 14.7   ?Hematocrit 35.9 - 44.6 % 43.9   ?MCV 80.0 - 96.0 FL 105.6 High    ?MCH 27.5 - 33.2 PG 35.3 High    ?MCHC 33.0 - 37.0 G/DL 33.5   ?RDW 12.3 - 17.0 % 13.1   ?Platelets 150 - 450 X 10*3/uL 222   ?MPV 6.8 - 10.2 FL 9.7   ?Neutrophil % % 53   ?Lymphocyte % % 39   ?Monocyte % % 6   ?Eosinophil % % 1   ?Basophil % % 1   ?Neutrophil Absolute 1.8 - 7.8 x 10*3/uL 3.4   ?Lymphocyte Absolute 1.0 - 4.8 x 10*3/uL 2.5   ?Monocyte Absolute 0.0 - 0.8 x 10*3/uL 0.4   ?Eosinophil Absolute 0.0 - 0.5 x 10*3/uL 0.1   ?Basophil Absolute 0.0 - 0.2 x 10*3/uL 0.0   ?nRBC <=0 x 10*3/uL 0   ? ?Review of Systems  ?Constitutional:  Positive for appetite change, chills, fatigue and fever.  ?HENT:  Positive for ear pain and hearing loss. Negative for ear discharge and sore throat.   ?Respiratory:  Positive for cough. Negative for shortness of breath and wheezing.   ?Cardiovascular:  Negative for chest pain, palpitations and leg swelling.  ?Gastrointestinal:  Positive for abdominal pain, nausea and vomiting.  ?Neurological:  Negative for syncope and facial asymmetry.  ?Psychiatric/Behavioral:  Negative for hallucinations. The patient is nervous/anxious.   ?Rest see pertinent positives and negatives per HPI. ? ?Current Outpatient Medications on File Prior to Visit  ?Medication Sig Dispense Refill  ? acetaminophen (TYLENOL) 500 MG tablet Take 1,000 mg by mouth daily as needed for headache.    ? Apoaequorin (PREVAGEN PO) Take by mouth daily.    ? brimonidine (ALPHAGAN P) 0.1 % SOLN Apply to eye.    ? Calcium Carbonate (CALTRATE 600 PO) Take by mouth daily.    ? cycloSPORINE (RESTASIS) 0.05 % ophthalmic emulsion Place 1 drop into both eyes 2 (two)  times daily.    ? denosumab (PROLIA) 60 MG/ML SOSY injection Inject 60 mg into the skin every 6 (six) months.    ? dorzolamide-timolol (COSOPT) 22.3-6.8 MG/ML ophthalmic solution Place 1 drop into both eyes 2 times daily.    ? Estradiol 10 MCG TABS vaginal tablet One tablet vaginally daily. 24 tablet 3  ? folic acid (FOLVITE) 1 MG tablet Take by mouth.    ? furosemide (LASIX) 40 MG tablet Take 1 tablet (40 mg total) by mouth daily. (from nephrology) 30 tablet 3  ? HYDROcodone-acetaminophen (NORCO/VICODIN) 5-325 MG tablet Take 1 tablet by mouth every 6 (six) hours as needed for severe pain. 10 tablet 0  ? LINZESS 290 MCG CAPS capsule Take 290 mcg by mouth daily before breakfast.   0  ? LORazepam (ATIVAN) 0.5 MG tablet Take 0.5 mg by mouth in the morning, at noon, and at bedtime. 1 in am ?1 at midday ?2 at hs    ? olopatadine (PATANOL) 0.1 % ophthalmic solution 1 drop 2 (two) times daily.    ? ondansetron (ZOFRAN-ODT) 4 MG disintegrating tablet Take 1 tablet (4 mg total) by mouth every 8 (eight) hours as needed for nausea or vomiting. 10 tablet 0  ? Probiotic Product (ALIGN PO) Take by mouth daily.    ? sodium bicarbonate 650 MG tablet TK 1 T PO BID  9  ? topiramate (TOPAMAX) 100 MG tablet Take 3 tablets at bedtime    ? traMADol (ULTRAM) 50 MG tablet Take 1-2 tablets (50-100 mg total) by mouth every 6 (six) hours as needed for moderate pain. 15 tablet 0  ? Travoprost, BAK Free, (TRAVATAN) 0.004 % SOLN ophthalmic solution 1 drop at bedtime.    ? tretinoin (RETIN-A) 0.025 % cream APPLY A PEA SIZED AMOUNT TO FACE ONCE DAILY IN THE EVENING    ? UBRELVY 100 MG TABS     ? ZENPEP 15000 units CPEP Take 45,000 mg by mouth 2 (two) times daily.  0  ? SUMAtriptan (IMITREX) 100 MG tablet Take 100 mg by mouth every 2 (two) hours as needed for migraine.  (Patient not taking: Reported on 04/06/2022)    ? ?No current facility-administered medications on file prior to visit.  ? ?Past Medical History:  ?Diagnosis Date  ? Anemia   ?  Anxiety   ? Arthritis   ? Benzodiazepine dependence ([place])   ? Carotid arterial disease ([place])   ? mild  ? Chronic kidney disease (CKD), stage III (moderate) ([place]) 08/01/2018  ? -seeing  Nephrology,  Dr. 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 Chronic pain   ? managed by pain clinic  ? Colon abnormality   ? didn't work right so part of it was removed at [facility]  ? Depression   ? managed by Dr. Toy Care  ? Diastolic heart failure ([place])   ? mild on echo 2016 no cardiologist  ? GERD (gastroesophageal reflux disease)   ? IBS (irritable bowel syndrome)   ? sees Dr. Earlean Shawl  ? Macular degeneration   ? goes to [facility] for this  ? Migraine   ? managed by neurologist  ? Osteoporosis   ? Sty   ? right eye  ? Uvulitis   ? Vertigo   ? seeing  dr bates for lov 08-11-2021 epic  ? Wears glasses   ? for reading  ? Wears hearing aid in both ears   ? ?Allergies  ?Allergen Reactions  ? Elemental Sulfur Other (See Comments)  ? Latanoprost   ?  Other reaction(s): Other (See Comments) ?Severe burning.  ? Sulfa Antibiotics Swelling and Other (See Comments)  ?  Reaction:  Unspecified swelling reaction   ? ? ?Social History  ? ?Socioeconomic History  ? Marital status: Married  ?  Spouse name: Not on file  ? Number of children: Not on file  ? Years of education: Not on file  ? Highest education level: Not on file  ?Occupational History  ? Not on file  ?Tobacco Use  ? Smoking status: Never  ? Smokeless tobacco: Never  ?Vaping Use  ? Vaping Use: Never used  ?Substance and Sexual Activity  ? Alcohol use: No  ? Drug use: No  ? Sexual activity: Not Currently  ?  Birth control/protection: Post-menopausal  ?Other Topics Concern  ? Not on file  ?Social History Narrative  ? Work or School: none  ?   ? Home Situation: lives with husband  ?   ? Spiritual Beliefs:   ?   ? Lifestyle: no regular exercise; diet is ok  ?   ?   ? ?Social Determinants of Health  ? ?Financial Resource Strain: Not on file  ?Food Insecurity: Not on file   ?Transportation Needs: Not on file  ?Physical Activity: Not on file  ?Stress: Not on file  ?Social Connections: Not on file  ? ?Vitals:  ? 04/06/22 1442  ?BP: 120/70  ?Pulse: 86  ?Resp: 16  ?Temp: 98.1 ?F (36.7 ?C)  ?S

## 2022-04-06 ENCOUNTER — Encounter: Payer: Self-pay | Admitting: Family Medicine

## 2022-04-06 ENCOUNTER — Ambulatory Visit (INDEPENDENT_AMBULATORY_CARE_PROVIDER_SITE_OTHER): Payer: Medicare HMO

## 2022-04-06 ENCOUNTER — Ambulatory Visit (INDEPENDENT_AMBULATORY_CARE_PROVIDER_SITE_OTHER): Payer: Medicare HMO | Admitting: Family Medicine

## 2022-04-06 ENCOUNTER — Telehealth: Payer: Self-pay | Admitting: Family Medicine

## 2022-04-06 VITALS — BP 120/70 | HR 86 | Temp 98.1°F | Resp 16 | Ht 60.0 in | Wt 116.4 lb

## 2022-04-06 DIAGNOSIS — R42 Dizziness and giddiness: Secondary | ICD-10-CM | POA: Diagnosis not present

## 2022-04-06 DIAGNOSIS — R109 Unspecified abdominal pain: Secondary | ICD-10-CM

## 2022-04-06 DIAGNOSIS — R509 Fever, unspecified: Secondary | ICD-10-CM

## 2022-04-06 DIAGNOSIS — N1832 Chronic kidney disease, stage 3b: Secondary | ICD-10-CM | POA: Diagnosis not present

## 2022-04-06 DIAGNOSIS — R112 Nausea with vomiting, unspecified: Secondary | ICD-10-CM

## 2022-04-06 DIAGNOSIS — R059 Cough, unspecified: Secondary | ICD-10-CM | POA: Diagnosis not present

## 2022-04-06 MED ORDER — MECLIZINE HCL 25 MG PO TABS: 12.5000 mg | ORAL_TABLET | Freq: Two times a day (BID) | ORAL | 0 refills | Status: AC | PRN

## 2022-04-06 NOTE — Patient Instructions (Addendum)
A few things to remember from today's visit: ? ? ?Nausea and vomiting in adult - Plan: Basic metabolic panel, CBC with Differential/Platelet ? ?Dizziness, nonspecific - Plan: meclizine (ANTIVERT) 25 MG tablet ? ?Persistent fever - Plan: Sedimentation rate, C-reactive protein, DG Chest 2 View, Urinalysis with Culture Reflex, TSH ? ?If you need refills please call your pharmacy. ?Do not use My Chart to request refills or for acute issues that need immediate attention. ? Monitor temp and bring readings with you for next visit. ?Caution with meclizine because it may cause drowsiness. ?Fall precautions. ? ? ?Vertigo ?Vertigo is the feeling that you or the things around you are moving when they are not. This feeling can come and go at any time. Vertigo often goes away on its own. This condition can be dangerous if it happens when you are doing activities like driving or working with machines. ?Your doctor will do tests to find the cause of your vertigo. These tests will also help your doctor decide on the best treatment for you. ?Follow these instructions at home: ?Eating and drinking ? ?  ? ?Drink enough fluid to keep your pee (urine) pale yellow. ?Do not drink alcohol. ?Activity ?Return to your normal activities when your doctor says that it is safe. ?In the morning, first sit up on the side of the bed. When you feel okay, stand slowly while you hold onto something until you know that your balance is fine. ?Move slowly. Avoid sudden body or head movements or certain positions, as told by your doctor. ?Use a cane if you have trouble standing or walking. ?Sit down right away if you feel dizzy. ?Avoid doing any tasks or activities that can cause danger to you or others if you get dizzy. ?Avoid bending down if you feel dizzy. Place items in your home so that they are easy for you to reach without bending or leaning over. ?Do not drive or use machinery if you feel dizzy. ?General instructions ?Take over-the-counter and  prescription medicines only as told by your doctor. ?Keep all follow-up visits. ?Contact a doctor if: ?Your medicine does not help your vertigo. ?Your problems get worse or you have new symptoms. ?You have a fever. ?You feel like you may vomit (nauseous), or this feeling gets worse. ?You start to vomit. ?Your family or friends see changes in how you act. ?You lose feeling (have numbness) in part of your body. ?You feel prickling and tingling in a part of your body. ?Get help right away if: ?You are always dizzy. ?You faint. ?You get very bad headaches. ?You get a stiff neck. ?Bright light starts to bother you. ?You have trouble moving or talking. ?You feel weak in your hands, arms, or legs. ?You have changes in your hearing or in how you see (vision). ?These symptoms may be an emergency. Get help right away. Call your local emergency services (911 in the [place] ?Do not wait to see if the symptoms will go away. ?Do not drive yourself to the hospital. ?Summary ?Vertigo is the feeling that you or the things around you are moving when they are not. ?Your doctor will do tests to find the cause of your vertigo. ?You may be told to avoid some tasks, positions, or movements. ?Contact a doctor if your medicine is not helping, or if you have a fever, new symptoms, or a change in how you act. ?Get help right away if you get very bad headaches, or if you have changes in how you  speak, hear, or see. ?This information is not intended to replace advice given to you by your health care provider. Make sure you discuss any questions you have with your health care provider. ?Document Revised: 10/15/2020 Document Reviewed: 10/15/2020 ?Elsevier Patient Education ? [employer]. ? ?Please be sure medication list is accurate. ?If a new problem present, please set up appointment sooner than planned today. ? ? ? ? ? ? ? ?

## 2022-04-06 NOTE — Telephone Encounter (Signed)
Danielle Gaines (pt husband) wanted to speak to [facility]; I let him know that she was rooming pts; he stated that he just wanted to talk to her about something personal and that he will be back tomorrow. I let him know that I will send a msg back.  ? ?Please advise.  ?

## 2022-04-07 LAB — CBC WITH DIFFERENTIAL/PLATELET
Basophils Absolute: 0.1 10*3/uL (ref 0.0–0.1)
Basophils Relative: 1.1 % (ref 0.0–3.0)
Eosinophils Absolute: 0 10*3/uL (ref 0.0–0.7)
Eosinophils Relative: 0.3 % (ref 0.0–5.0)
HCT: 44.2 % (ref 36.0–46.0)
Hemoglobin: 14.6 g/dL (ref 12.0–15.0)
Lymphocytes Relative: 34.7 % (ref 12.0–46.0)
Lymphs Abs: 2.7 10*3/uL (ref 0.7–4.0)
MCHC: 33.1 g/dL (ref 30.0–36.0)
MCV: 104.6 fl — ABNORMAL HIGH (ref 78.0–100.0)
Monocytes Absolute: 0.5 10*3/uL (ref 0.1–1.0)
Monocytes Relative: 6.6 % (ref 3.0–12.0)
Neutro Abs: 4.4 10*3/uL (ref 1.4–7.7)
Neutrophils Relative %: 57.3 % (ref 43.0–77.0)
Platelets: 236 10*3/uL (ref 150.0–400.0)
RBC: 4.23 Mil/uL (ref 3.87–5.11)
RDW: 12.9 % (ref 11.5–15.5)
WBC: 7.7 10*3/uL (ref 4.0–10.5)

## 2022-04-07 LAB — BASIC METABOLIC PANEL
BUN: 42 mg/dL — ABNORMAL HIGH (ref 6–23)
CO2: 15 mEq/L — ABNORMAL LOW (ref 19–32)
Calcium: 11 mg/dL — ABNORMAL HIGH (ref 8.4–10.5)
Chloride: 105 mEq/L (ref 96–112)
Creatinine, Ser: 2.53 mg/dL — ABNORMAL HIGH (ref 0.40–1.20)
GFR: 17.69 mL/min — ABNORMAL LOW (ref >60.00)
Glucose, Bld: 105 mg/dL — ABNORMAL HIGH (ref 70–99)
Potassium: 3.4 mEq/L — ABNORMAL LOW (ref 3.5–5.1)
Sodium: 134 mEq/L — ABNORMAL LOW (ref 135–145)

## 2022-04-07 LAB — C-REACTIVE PROTEIN: CRP: <1 mg/dL (ref 0.5–20.0)

## 2022-04-07 LAB — TSH: TSH: 1.83 u[IU]/mL (ref 0.35–5.50)

## 2022-04-07 LAB — SEDIMENTATION RATE: Sed Rate: 11 mm/hr (ref 0–30)

## 2022-04-07 NOTE — Telephone Encounter (Signed)
Left a message for the patient's husband to return my call.  ?

## 2022-04-08 ENCOUNTER — Encounter: Payer: Self-pay | Admitting: Family Medicine

## 2022-04-08 ENCOUNTER — Ambulatory Visit: Payer: Medicare HMO | Admitting: Endocrinology

## 2022-04-08 NOTE — Telephone Encounter (Signed)
Pt husband [name] is returning your call ?

## 2022-04-09 LAB — URINE CULTURE
MICRO NUMBER:: 13373146
SPECIMEN QUALITY:: ADEQUATE

## 2022-04-09 LAB — URINALYSIS W MICROSCOPIC + REFLEX CULTURE
Bacteria, UA: NONE SEEN /HPF
Bilirubin Urine: NEGATIVE
Glucose, UA: NEGATIVE
Hgb urine dipstick: NEGATIVE
Hyaline Cast: NONE SEEN /LPF
Ketones, ur: NEGATIVE
Nitrites, Initial: NEGATIVE
RBC / HPF: NONE SEEN /HPF (ref 0–2)
Specific Gravity, Urine: 1.017 (ref 1.001–1.035)
pH: 6 (ref 5.0–8.0)

## 2022-04-09 LAB — CULTURE INDICATED

## 2022-04-12 NOTE — Telephone Encounter (Signed)
Ok for referral?

## 2022-04-12 NOTE — Telephone Encounter (Signed)
Spoke with Mr Vicencio and he stated the patient would like a referral to Dr Fuller Plan due to recurrent abdominal pain as her current GI, Dr Earlean Shawl is retiring and does not have any openings.  Message sent to PCP. ?

## 2022-04-12 NOTE — Telephone Encounter (Signed)
Spoke with Danielle Gaines, informed him the referral was placed as below and someone will contact him with appt info. ?

## 2022-04-20 ENCOUNTER — Ambulatory Visit: Payer: Medicare HMO | Admitting: Family Medicine

## 2022-04-23 NOTE — Progress Notes (Unsigned)
HPI: Danielle Gaines is a 81 y.o. female with hx of orthostatic hypotension,chronic headaches,CKD, hearing loss,depression, and chronic pain here today with her husband to follow on recent visit. She was seen on 04/06/22 when she had several complaints, including dizziness, nausea, vomiting, and intermittent fever for the past couple months. Blood work was not suggestive of a serious infectious or inflammatory process.  Lab Results  Component Value Date   ESRSEDRATE 11 04/06/2022   Lab Results  Component Value Date   CRP <1.0 04/06/2022   Lab Results  Component Value Date   WBC 7.7 04/06/2022   HGB 14.6 04/06/2022   HCT 44.2 04/06/2022   MCV 104.6 (H) 04/06/2022   PLT 236.0 04/06/2022   Lab Results  Component Value Date   ALT 14 09/03/2020   AST 16 09/03/2020   ALKPHOS 57 09/03/2020   BILITOT <0.2 09/03/2020   Recommended meclizine 25 mg daily as needed. She is reporting a great improvement in dizziness. Denies side effects from meclizine. Vomiting has resolved. Still mildly nauseated but "a lot" better.  She is still having problems with her ears, fullness sensation. She has an appointment with ENT in a few days.  CKD III: She follows with nephrologist, last visit I see in 07/2021. Last e GFR 17 (04/06/22), she was instructed to increased fluid intake and have BMP repeated a week later. Denies gross hematuria or foam in urine. She does not feel like she is urinating as much as she should because she drinks a lot of fluids. Water and Pedialyte.  Lab Results  Component Value Date   CREATININE 2.53 (H) 04/06/2022   BUN 42 (H) 04/06/2022   NA 134 (L) 04/06/2022   K 3.4 (L) 04/06/2022   CL 105 04/06/2022   CO2 15 (L) 04/06/2022   Urine culture :  Enterococcus faecalis Abnormal    Comment: 10,000-49,000 CFU/mL of Enterococcus faecalis   She completed treatment with amoxicillin. She has not had dysuria since her last visit but reports having intermittent pain with  urination for a while. She follows with urologist.  Fever has resolved. Chronic abdominal pain, no changes. She follows with gastroenterologist.  Review of Systems  Constitutional:  Positive for appetite change (She is eating better) and fatigue. Negative for chills and fever.  HENT:  Negative for mouth sores, nosebleeds and sore throat.   Eyes:  Negative for redness and visual disturbance.  Respiratory:  Negative for cough, shortness of breath and wheezing.   Cardiovascular:  Negative for chest pain, palpitations and leg swelling.  Gastrointestinal:        Negative for changes in bowel habits.  Skin:  Negative for rash.  Neurological:  Negative for syncope, facial asymmetry and weakness.  Psychiatric/Behavioral:  Negative for confusion. The patient is nervous/anxious.   Rest see pertinent positives and negatives per HPI.  Current Outpatient Medications on File Prior to Visit  Medication Sig Dispense Refill   acetaminophen (TYLENOL) 500 MG tablet Take 1,000 mg by mouth daily as needed for headache.     Apoaequorin (PREVAGEN PO) Take by mouth daily.     brimonidine (ALPHAGAN P) 0.1 % SOLN Apply to eye.     Calcium Carbonate (CALTRATE 600 PO) Take by mouth daily.     cycloSPORINE (RESTASIS) 0.05 % ophthalmic emulsion Place 1 drop into both eyes 2 (two) times daily.     denosumab (PROLIA) 60 MG/ML SOSY injection Inject 60 mg into the skin every 6 (six) months.  dorzolamide-timolol (COSOPT) 22.3-6.8 MG/ML ophthalmic solution Place 1 drop into both eyes 2 times daily.     Estradiol 10 MCG TABS vaginal tablet One tablet vaginally daily. 24 tablet 3   folic acid (FOLVITE) 1 MG tablet Take by mouth.     furosemide (LASIX) 40 MG tablet Take 1 tablet (40 mg total) by mouth daily. (from nephrology) 30 tablet 3   HYDROcodone-acetaminophen (NORCO/VICODIN) 5-325 MG tablet Take 1 tablet by mouth every 6 (six) hours as needed for severe pain. 10 tablet 0   LINZESS 290 MCG CAPS capsule Take 290  mcg by mouth daily before breakfast.   0   LORazepam (ATIVAN) 0.5 MG tablet Take 0.5 mg by mouth in the morning, at noon, and at bedtime. 1 in am 1 at midday 2 at hs     olopatadine (PATANOL) 0.1 % ophthalmic solution 1 drop 2 (two) times daily.     ondansetron (ZOFRAN-ODT) 4 MG disintegrating tablet Take 1 tablet (4 mg total) by mouth every 8 (eight) hours as needed for nausea or vomiting. 10 tablet 0   Probiotic Product (ALIGN PO) Take by mouth daily.     sodium bicarbonate 650 MG tablet TK 1 T PO BID  9   SUMAtriptan (IMITREX) 100 MG tablet Take 100 mg by mouth every 2 (two) hours as needed for migraine.     topiramate (TOPAMAX) 100 MG tablet Take 3 tablets at bedtime     traMADol (ULTRAM) 50 MG tablet Take 1-2 tablets (50-100 mg total) by mouth every 6 (six) hours as needed for moderate pain. 15 tablet 0   Travoprost, BAK Free, (TRAVATAN) 0.004 % SOLN ophthalmic solution 1 drop at bedtime.     tretinoin (RETIN-A) 0.025 % cream APPLY A PEA SIZED AMOUNT TO FACE ONCE DAILY IN THE EVENING     UBRELVY 100 MG TABS      ZENPEP 15000 units CPEP Take 45,000 mg by mouth 2 (two) times daily.  0   No current facility-administered medications on file prior to visit.    Past Medical History:  Diagnosis Date   Anemia    Anxiety    Arthritis    Benzodiazepine dependence (HCC)    Carotid arterial disease (HCC)    mild   Chronic kidney disease (CKD), stage III (moderate) ([place]) 08/01/2018   -seeing  Nephrology, Dr. 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 Toy Care   Diastolic heart failure [facility])    mild on echo 2016 no cardiologist   GERD (gastroesophageal reflux disease)    IBS (irritable bowel syndrome)    sees Dr. Earlean Shawl   Macular degeneration    goes to [facility] for this   Migraine    managed by neurologist    Osteoporosis    Sty    right eye   Uvulitis    Vertigo    seeing  dr bates for [facility] 08-11-2021 epic   Wears glasses    for reading   Wears hearing aid in both ears    Allergies  Allergen Reactions   Elemental Sulfur Other (See Comments)   Latanoprost     Other reaction(s): Other (See Comments) Severe burning.   Sulfa Antibiotics Swelling and Other (See Comments)    Reaction:  Unspecified swelling reaction  Social History   Socioeconomic History   Marital status: Married    Spouse name: Not on file   Number of children: Not on file   Years of education: Not on file   Highest education level: Not on file  Occupational History   Not on file  Tobacco Use   Smoking status: Never   Smokeless tobacco: Never  Vaping Use   Vaping Use: Never used  Substance and Sexual Activity   Alcohol use: No   Drug use: No   Sexual activity: Not Currently    Birth control/protection: Post-menopausal  Other Topics Concern   Not on file  Social History Narrative   Work or School: none      Home Situation: lives with husband      Spiritual Beliefs:       Lifestyle: no regular exercise; diet is ok         Social Determinants of Radio broadcast assistant Strain: Not on file  Food Insecurity: Not on file  Transportation Needs: Not on file  Physical Activity: Not on file  Stress: Not on file  Social Connections: Not on file   Vitals:   04/27/22 1223  BP: 118/70  Pulse: 78  Resp: 16  SpO2: 97%   Wt Readings from Last 3 Encounters:  04/27/22 120 lb 4 oz (54.5 kg)  04/06/22 116 lb 6 oz (52.8 kg)  12/21/21 120 lb 1.6 oz (54.5 kg)   Body mass index is 23.48 kg/m.  Physical Exam Vitals and nursing note reviewed.  Constitutional:      General: She is not in acute distress.    Appearance: She is well-developed.  HENT:     Head: Normocephalic and atraumatic.     Ears:     Comments: Hearing aids on.    Mouth/Throat:     Mouth: Mucous membranes are dry.     Pharynx:  Oropharynx is clear.  Eyes:     Conjunctiva/sclera: Conjunctivae normal.  Cardiovascular:     Rate and Rhythm: Normal rate and regular rhythm.     Pulses:          Dorsalis pedis pulses are 2+ on the right side and 2+ on the left side.     Heart sounds: No murmur heard. Pulmonary:     Effort: Pulmonary effort is normal. No respiratory distress.     Breath sounds: Normal breath sounds.  Abdominal:     Palpations: Abdomen is soft. There is no hepatomegaly or mass.     Tenderness: There is generalized abdominal tenderness (Mild). There is no guarding or rebound.  Musculoskeletal:     Right lower leg: No edema.     Left lower leg: No edema.  Lymphadenopathy:     Cervical: No cervical adenopathy.  Skin:    General: Skin is warm.     Findings: No erythema or rash.  Neurological:     General: No focal deficit present.     Mental Status: She is alert and oriented to person, place, and time.     Cranial Nerves: No cranial nerve deficit.     Comments: Stable gait, not assisted.  Psychiatric:        Mood and Affect: Mood is anxious.     Comments: Well groomed, good eye contact.   ASSESSMENT AND PLAN:  Ms.Tyanna was seen today for follow-up.  Diagnoses and all orders for this visit: Orders Placed This Encounter  Procedures   Basic metabolic panel   Lab  Results  Component Value Date   CREATININE 1.41 (H) 04/27/2022   BUN 23 04/27/2022   NA 138 04/27/2022   K 3.6 04/27/2022   CL 112 04/27/2022   CO2 17 (L) 04/27/2022   Stage 3 chronic kidney disease, unspecified whether stage 3a or 3b CKD ([place]) 04/06/22 AKI due to dehydration most likely. BMP was not repeated as recommended. She has been able to increase fluid intake. Husband is calling to arrange appt with nephrologist.  Dizziness, nonspecific Greatly improved. We discussed some side effects of Meclizine, including drowsiness and risk for falls. Continue Meclizine 25 mg 1/2-1 tab daily prn. Fall precautions discussed. She  has appt with ENT this Thursday.  -     meclizine (ANTIVERT) 25 MG tablet; Take 0.5-1 tablets (12.5-25 mg total) by mouth daily as needed for dizziness.  Nausea without vomiting Vomiting has resolved. Nausea has improved. Continue adequate hydration. Continue following with GI, arrange appt if problem gets worse.  Ear fullness, bilateral Instructed to keep appt with ENT.  I spent a total of 40 minutes in both face to face and non face to face activities for this visit on the date of this encounter. During this time history was obtained and documented, examination was performed, prior labs reviewed, and assessment/plan discussed. We reviewed last visit with nephrologist, she was supposed to follow in 11/2021.  Recurrent urinary symptoms, she follows frequently with urologist.  Return if symptoms worsen or fail to improve.  Karianne Nogueira G. [place], MD  [facility]. [place] office.

## 2022-04-27 ENCOUNTER — Encounter: Payer: Self-pay | Admitting: Family Medicine

## 2022-04-27 ENCOUNTER — Ambulatory Visit (INDEPENDENT_AMBULATORY_CARE_PROVIDER_SITE_OTHER): Payer: Medicare HMO | Admitting: Family Medicine

## 2022-04-27 VITALS — BP 118/70 | HR 78 | Resp 16 | Ht 60.0 in | Wt 120.2 lb

## 2022-04-27 DIAGNOSIS — H938X3 Other specified disorders of ear, bilateral: Secondary | ICD-10-CM

## 2022-04-27 DIAGNOSIS — N183 Chronic kidney disease, stage 3 unspecified: Secondary | ICD-10-CM | POA: Diagnosis not present

## 2022-04-27 DIAGNOSIS — R42 Dizziness and giddiness: Secondary | ICD-10-CM | POA: Diagnosis not present

## 2022-04-27 DIAGNOSIS — R110 Nausea: Secondary | ICD-10-CM

## 2022-04-27 LAB — BASIC METABOLIC PANEL
BUN: 23 mg/dL (ref 6–23)
CO2: 17 mEq/L — ABNORMAL LOW (ref 19–32)
Calcium: 9.6 mg/dL (ref 8.4–10.5)
Chloride: 112 mEq/L (ref 96–112)
Creatinine, Ser: 1.41 mg/dL — ABNORMAL HIGH (ref 0.40–1.20)
GFR: 35.67 mL/min — ABNORMAL LOW (ref >60.00)
Glucose, Bld: 99 mg/dL (ref 70–99)
Potassium: 3.6 mEq/L (ref 3.5–5.1)
Sodium: 138 mEq/L (ref 135–145)

## 2022-04-27 MED ORDER — MECLIZINE HCL 25 MG PO TABS: 12.5000 mg | ORAL_TABLET | Freq: Every day | ORAL | 0 refills | Status: DC | PRN

## 2022-04-27 NOTE — Patient Instructions (Addendum)
A few things to remember from today's visit:  Stage 3 chronic kidney disease, unspecified whether stage 3a or 3b CKD ([place]) - Plan: Basic metabolic panel, Basic metabolic panel  Dizziness, nonspecific - Plan: meclizine (ANTIVERT) 25 MG tablet  Nausea without vomiting  If you need refills please call your pharmacy. Do not use My Chart to request refills or for acute issues that need immediate attention.   Please be sure medication list is accurate. If a new problem present, please set up appointment sooner than planned today.  Please call your nephrologist's office to arrange follow up appt. Keep appt with ENT. Follow with gastroenterologist if nausea is persistent or if vomiting is back. Hold on Furosemide for now. Compression stockings will help with leg swelling.

## 2022-04-29 DIAGNOSIS — H6123 Impacted cerumen, bilateral: Secondary | ICD-10-CM | POA: Diagnosis not present

## 2022-05-03 ENCOUNTER — Telehealth: Payer: Self-pay | Admitting: Gastroenterology

## 2022-05-03 NOTE — Telephone Encounter (Signed)
Hi Dr. Fuller Plan,  We received records on this patient who is requesting a transfer of care to you as her former GI doc, Dr. Earlean Shawl, has retired.  Her records are being sent to you for review.  Please let me know if you approve her transfer and how you would like to proceed.    Thank you.

## 2022-05-04 NOTE — Telephone Encounter (Signed)
Request received to transfer GI care from outside practice to [facility].  We appreciate the interest in our practice however, due to capacity and high demand from patients without established GI practices we cannot accommodate this transfer.

## 2022-05-11 ENCOUNTER — Ambulatory Visit: Payer: Medicare HMO | Admitting: Orthopaedic Surgery

## 2022-05-11 DIAGNOSIS — H30033 Focal chorioretinal inflammation, peripheral, bilateral: Secondary | ICD-10-CM | POA: Diagnosis not present

## 2022-05-11 DIAGNOSIS — H44113 Panuveitis, bilateral: Secondary | ICD-10-CM | POA: Diagnosis not present

## 2022-05-11 DIAGNOSIS — H35371 Puckering of macula, right eye: Secondary | ICD-10-CM | POA: Diagnosis not present

## 2022-05-11 DIAGNOSIS — H348332 Tributary (branch) retinal vein occlusion, bilateral, stable: Secondary | ICD-10-CM | POA: Diagnosis not present

## 2022-05-11 DIAGNOSIS — H35342 Macular cyst, hole, or pseudohole, left eye: Secondary | ICD-10-CM | POA: Diagnosis not present

## 2022-05-11 DIAGNOSIS — H3581 Retinal edema: Secondary | ICD-10-CM | POA: Diagnosis not present

## 2022-05-11 DIAGNOSIS — Z961 Presence of intraocular lens: Secondary | ICD-10-CM | POA: Diagnosis not present

## 2022-05-11 DIAGNOSIS — Z9889 Other specified postprocedural states: Secondary | ICD-10-CM | POA: Diagnosis not present

## 2022-05-11 DIAGNOSIS — H18529 Epithelial (juvenile) corneal dystrophy, unspecified eye: Secondary | ICD-10-CM | POA: Diagnosis not present

## 2022-05-14 ENCOUNTER — Telehealth: Payer: Self-pay | Admitting: Family Medicine

## 2022-05-14 NOTE — Telephone Encounter (Signed)
Left message for patient to call back and schedule Medicare Annual Wellness Visit (AWV) either virtually or in office. Left  my jabber number [phone]   Last AWV ;08/18/17  please schedule at anytime with [facility] Nurse Health Advisor 1 or 2    

## 2022-05-14 NOTE — Telephone Encounter (Signed)
Danielle Gaines (pt husband) called and stated that pt needs to get some referrals out and wanted to see who [name] will be established with now that [name] is gone.   I let him know that "Dr. Marcell Anger will be the new Dr. that will be starting mid-July. However we have Dr. Justin Mend right now as a stand in." Danielle Gaines stated that [name] would rather wait for Dr. Marcell Anger and will call back later this month to see if her sched is available.   FYI.

## 2022-05-25 DIAGNOSIS — H35342 Macular cyst, hole, or pseudohole, left eye: Secondary | ICD-10-CM | POA: Diagnosis not present

## 2022-05-25 DIAGNOSIS — H44113 Panuveitis, bilateral: Secondary | ICD-10-CM | POA: Diagnosis not present

## 2022-05-25 DIAGNOSIS — H18529 Epithelial (juvenile) corneal dystrophy, unspecified eye: Secondary | ICD-10-CM | POA: Diagnosis not present

## 2022-05-25 DIAGNOSIS — Z9889 Other specified postprocedural states: Secondary | ICD-10-CM | POA: Diagnosis not present

## 2022-05-25 DIAGNOSIS — H30033 Focal chorioretinal inflammation, peripheral, bilateral: Secondary | ICD-10-CM | POA: Diagnosis not present

## 2022-05-25 DIAGNOSIS — H35371 Puckering of macula, right eye: Secondary | ICD-10-CM | POA: Diagnosis not present

## 2022-05-25 DIAGNOSIS — H348332 Tributary (branch) retinal vein occlusion, bilateral, stable: Secondary | ICD-10-CM | POA: Diagnosis not present

## 2022-05-25 DIAGNOSIS — Z961 Presence of intraocular lens: Secondary | ICD-10-CM | POA: Diagnosis not present

## 2022-05-25 DIAGNOSIS — H3581 Retinal edema: Secondary | ICD-10-CM | POA: Diagnosis not present

## 2022-05-26 ENCOUNTER — Other Ambulatory Visit: Payer: Self-pay | Admitting: Family Medicine

## 2022-05-26 DIAGNOSIS — R42 Dizziness and giddiness: Secondary | ICD-10-CM

## 2022-06-02 DIAGNOSIS — N1831 Chronic kidney disease, stage 3a: Secondary | ICD-10-CM | POA: Diagnosis not present

## 2022-06-02 DIAGNOSIS — G43719 Chronic migraine without aura, intractable, without status migrainosus: Secondary | ICD-10-CM | POA: Diagnosis not present

## 2022-06-02 DIAGNOSIS — N200 Calculus of kidney: Secondary | ICD-10-CM | POA: Diagnosis not present

## 2022-06-03 ENCOUNTER — Ambulatory Visit: Payer: Medicare HMO | Admitting: Orthopaedic Surgery

## 2022-06-09 ENCOUNTER — Telehealth: Payer: Self-pay | Admitting: Family Medicine

## 2022-06-09 ENCOUNTER — Ambulatory Visit: Payer: Medicare HMO | Admitting: Physical Therapy

## 2022-06-09 NOTE — Telephone Encounter (Signed)
Left message for patient to call back and schedule Medicare Annual Wellness Visit (AWV) either virtually or in office. Left  my Herbie Drape number [phone]   Last AWV ;08/18/17  please schedule at anytime with [facility] Nurse Health Advisor 1 or 2

## 2022-06-15 ENCOUNTER — Telehealth: Payer: Self-pay | Admitting: Family Medicine

## 2022-06-15 NOTE — Telephone Encounter (Signed)
Patient husband stopped by yesterday evening to see if patient could begin taking furosemide (LASIX) 40 MG tablet again, as she is swelling in her legs. I advised husband that she would need an appointment, per CMA, as the last time she seen Dr.Jordan it was May and they would need to make sure there was nothing else going on. Husband attempted to call wife to discuss scheduling an appointment but could not reach her, stated they would call back to schedule and to send the message back in the meantime.      Please advise

## 2022-06-15 NOTE — Telephone Encounter (Signed)
I called and spoke with patient. She is aware of message below & verbalized understanding.

## 2022-06-15 NOTE — Telephone Encounter (Signed)
It is ok, I will be glad to see her while she is establishing with new PCP. She can also address this problem with her nephrologist. For now recommend wearing compression stockings. Thanks, BJ

## 2022-06-17 ENCOUNTER — Ambulatory Visit: Payer: Medicare HMO | Admitting: Physical Therapy

## 2022-06-17 DIAGNOSIS — K6389 Other specified diseases of intestine: Secondary | ICD-10-CM | POA: Diagnosis not present

## 2022-06-17 DIAGNOSIS — R109 Unspecified abdominal pain: Secondary | ICD-10-CM | POA: Diagnosis not present

## 2022-06-17 DIAGNOSIS — N952 Postmenopausal atrophic vaginitis: Secondary | ICD-10-CM | POA: Diagnosis not present

## 2022-06-17 DIAGNOSIS — N200 Calculus of kidney: Secondary | ICD-10-CM | POA: Diagnosis not present

## 2022-06-17 DIAGNOSIS — K5939 Other megacolon: Secondary | ICD-10-CM | POA: Diagnosis not present

## 2022-06-17 DIAGNOSIS — I700 Atherosclerosis of aorta: Secondary | ICD-10-CM | POA: Diagnosis not present

## 2022-06-29 NOTE — Telephone Encounter (Signed)
Prolia VOB initiated via [email or web address]  Last Prolia inj 02/10/22 Next Prolia inj due 08/14/22   Prior Auth: APPROVED PA# [id number] Valid: 05/16/20-11/28/22

## 2022-07-02 DIAGNOSIS — N1832 Chronic kidney disease, stage 3b: Secondary | ICD-10-CM | POA: Diagnosis not present

## 2022-07-02 DIAGNOSIS — H353111 Nonexudative age-related macular degeneration, right eye, early dry stage: Secondary | ICD-10-CM | POA: Diagnosis not present

## 2022-07-02 DIAGNOSIS — N390 Urinary tract infection, site not specified: Secondary | ICD-10-CM | POA: Diagnosis not present

## 2022-07-02 DIAGNOSIS — N189 Chronic kidney disease, unspecified: Secondary | ICD-10-CM | POA: Diagnosis not present

## 2022-07-03 DIAGNOSIS — H30033 Focal chorioretinal inflammation, peripheral, bilateral: Secondary | ICD-10-CM | POA: Diagnosis not present

## 2022-07-06 ENCOUNTER — Ambulatory Visit: Payer: Medicare HMO | Admitting: Physical Therapy

## 2022-07-08 DIAGNOSIS — N2581 Secondary hyperparathyroidism of renal origin: Secondary | ICD-10-CM | POA: Diagnosis not present

## 2022-07-08 DIAGNOSIS — N1831 Chronic kidney disease, stage 3a: Secondary | ICD-10-CM | POA: Diagnosis not present

## 2022-07-08 DIAGNOSIS — D631 Anemia in chronic kidney disease: Secondary | ICD-10-CM | POA: Diagnosis not present

## 2022-07-08 NOTE — Telephone Encounter (Signed)
Prior auth required for PROLIA ? ?PA PROCESS DETAILS: PA is required. PA can be initiated by calling [phone] or online at ?[email or web address] ? ?

## 2022-07-13 ENCOUNTER — Telehealth: Payer: Self-pay | Admitting: Family Medicine

## 2022-07-13 NOTE — Telephone Encounter (Signed)
Spoke with spouse to schedule awv.  He stated patient needed an appointment with provider.  I transferred to office and told him I would call back to schedule AWV

## 2022-07-13 NOTE — Telephone Encounter (Signed)
Pt ready for scheduling on or after 08/14/22   Out-of-pocket cost due at time of visit: $301   Primary: [employer] Prolia co-insurance: 20% (approximately $276) Admin fee co-insurance: 20% (approximately $25)   Secondary: n/a Prolia co-insurance:  Admin fee co-insurance:    Deductible: does not apply   Prior Auth: APPROVED PA# [id number] Valid: 05/16/20-11/28/22   ** This summary of benefits is an estimation of the patient's out-of-pocket cost. Exact cost may vary based on individual plan coverage.

## 2022-07-22 ENCOUNTER — Ambulatory Visit: Payer: Medicare HMO | Admitting: Physical Therapy

## 2022-07-26 ENCOUNTER — Telehealth: Payer: Self-pay | Admitting: Family Medicine

## 2022-07-26 NOTE — Telephone Encounter (Signed)
Left message for patient to call back and schedule Medicare Annual Wellness Visit (AWV) either virtually or in office. Left  my Danielle Gaines number [phone]   Last AWV9/20/18 ; please schedule at anytime with [facility] Nurse Health Advisor 1 or 2

## 2022-07-27 DIAGNOSIS — Z9889 Other specified postprocedural states: Secondary | ICD-10-CM | POA: Diagnosis not present

## 2022-07-27 DIAGNOSIS — H30033 Focal chorioretinal inflammation, peripheral, bilateral: Secondary | ICD-10-CM | POA: Diagnosis not present

## 2022-07-27 DIAGNOSIS — H3581 Retinal edema: Secondary | ICD-10-CM | POA: Diagnosis not present

## 2022-07-27 DIAGNOSIS — H18529 Epithelial (juvenile) corneal dystrophy, unspecified eye: Secondary | ICD-10-CM | POA: Diagnosis not present

## 2022-07-27 DIAGNOSIS — H35371 Puckering of macula, right eye: Secondary | ICD-10-CM | POA: Diagnosis not present

## 2022-07-27 DIAGNOSIS — H35342 Macular cyst, hole, or pseudohole, left eye: Secondary | ICD-10-CM | POA: Diagnosis not present

## 2022-07-27 DIAGNOSIS — H348332 Tributary (branch) retinal vein occlusion, bilateral, stable: Secondary | ICD-10-CM | POA: Diagnosis not present

## 2022-07-27 DIAGNOSIS — H44113 Panuveitis, bilateral: Secondary | ICD-10-CM | POA: Diagnosis not present

## 2022-07-27 DIAGNOSIS — Z961 Presence of intraocular lens: Secondary | ICD-10-CM | POA: Diagnosis not present

## 2022-08-10 ENCOUNTER — Other Ambulatory Visit: Payer: Self-pay | Admitting: Family Medicine

## 2022-08-10 DIAGNOSIS — Z1231 Encounter for screening mammogram for malignant neoplasm of breast: Secondary | ICD-10-CM

## 2022-08-11 DIAGNOSIS — H401131 Primary open-angle glaucoma, bilateral, mild stage: Secondary | ICD-10-CM | POA: Diagnosis not present

## 2022-08-11 DIAGNOSIS — H353212 Exudative age-related macular degeneration, right eye, with inactive choroidal neovascularization: Secondary | ICD-10-CM | POA: Diagnosis not present

## 2022-08-11 DIAGNOSIS — H353221 Exudative age-related macular degeneration, left eye, with active choroidal neovascularization: Secondary | ICD-10-CM | POA: Diagnosis not present

## 2022-08-17 ENCOUNTER — Encounter: Payer: Self-pay | Admitting: Family Medicine

## 2022-08-17 ENCOUNTER — Ambulatory Visit (INDEPENDENT_AMBULATORY_CARE_PROVIDER_SITE_OTHER): Payer: Medicare HMO

## 2022-08-17 ENCOUNTER — Ambulatory Visit: Payer: Medicare HMO

## 2022-08-17 ENCOUNTER — Ambulatory Visit (INDEPENDENT_AMBULATORY_CARE_PROVIDER_SITE_OTHER): Payer: Medicare HMO | Admitting: Family Medicine

## 2022-08-17 VITALS — BP 112/78 | HR 65 | Temp 98.0°F | Ht 60.0 in | Wt 120.2 lb

## 2022-08-17 DIAGNOSIS — R42 Dizziness and giddiness: Secondary | ICD-10-CM | POA: Diagnosis not present

## 2022-08-17 DIAGNOSIS — M810 Age-related osteoporosis without current pathological fracture: Secondary | ICD-10-CM | POA: Diagnosis not present

## 2022-08-17 DIAGNOSIS — Z23 Encounter for immunization: Secondary | ICD-10-CM

## 2022-08-17 MED ORDER — DENOSUMAB 60 MG/ML ~~LOC~~ SOSY
60.0000 mg | PREFILLED_SYRINGE | Freq: Once | SUBCUTANEOUS | Status: AC
  Administered 2022-08-17: 60 mg via SUBCUTANEOUS

## 2022-08-17 MED ORDER — MECLIZINE HCL 25 MG PO TABS: 25.0000 mg | ORAL_TABLET | Freq: Three times a day (TID) | ORAL | 2 refills | Status: DC | PRN

## 2022-08-17 NOTE — Progress Notes (Signed)
Patient seen today for Prolia injection.  60mg/ml given in left arm subQ.  Patient tolerated well.  Follow up 6 months or as advised by provider.Patient verified name, DOB and provided verbal consent prior to administration. 

## 2022-08-17 NOTE — Patient Instructions (Addendum)
Dr. Earlean Shawl recommended: Dr. Oren Beckmann, Dr. Mallie Darting, or Dr. Derrek Monaco at [facility]. Please call to schedule you visit.

## 2022-08-17 NOTE — Progress Notes (Signed)
Established Patient Office Visit  Subjective   Patient ID: Danielle Gaines, female    DOB: 22-Feb-1941  Age: 81 y.o. MRN: [id number]  Chief Complaint  Patient presents with   Establish Care    Pt is here for transition of care visit.  Patient reports that she is seeing the ophthalmologist for macular degeneration and glaucoma, states she is using the eye drops that are prescribed, states she even saw a second opinion, states that her right eye is burning some. She is being managed by her ophthalmologist frequently.   Migraines-- pt sees Neurology at [facility], last visit was July 2023. Is currently on Ubrelvy, states she has good control of her migraines with her medications.   Patient reports she would like a referral for a new GI physician, states her previous GI specialist retired. I reviewed the notes from the previous GI doctor and gave her the names of who he recommended for her.  Patient is reporting a recurrent symptom of dizziness/vertigo. She reports that this comes and goes for her, states she is prescribed meclizine which works well for her, she is only taking this as needed, not every day, only once in a while. I reviewed her medication list. I advised her to use caution when taking both her ativan and the meclizine together. I reviewed her last set of labs and her MCV was elevated on her bloodwork, however last B12 level was WNL.   CKD stage 3-- is following with nephrology, last visit was 07/2021. her last set of blood work was reviewed and her Cr was 1.41 which is at her baseline. She reports she is urinating well, no issues emptying her bladder.   Current Outpatient Medications  Medication Instructions   acetaminophen (TYLENOL) 1,000 mg, Oral, Daily PRN   Apoaequorin (PREVAGEN PO) Oral, Daily   brimonidine (ALPHAGAN P) 0.1 % SOLN Ophthalmic   Calcium Carbonate (CALTRATE 600 PO) Oral, Daily   citalopram (CELEXA) 40 mg, Oral, Daily   cycloSPORINE (RESTASIS) 0.05 % ophthalmic  emulsion 1 drop, Both Eyes, 2 times daily   denosumab (PROLIA) 60 mg, Subcutaneous, Every 6 months   dorzolamidel-timolol (COSOPT) 22.3-6.8 MG/ML SOLN ophthalmic solution 1 drop, 2 times daily   estradiol (ESTRACE) 0.1 MG/GM vaginal cream Vaginal   folic acid (FOLVITE) 1 MG tablet Oral   furosemide (LASIX) 20 mg, Oral, Daily   Linzess 290 mcg, Oral, Daily before breakfast   LORazepam (ATIVAN) 0.5 mg, Oral, 3 times daily, 1 in am<BR>1 at midday<BR>2 at hs   meclizine (ANTIVERT) 25 mg, Oral, 3 times daily PRN   olopatadine (PATANOL) 0.1 % ophthalmic solution 1 drop, 2 times daily   PRESCRIPTION MEDICATION Serum eye drops (given by provider at [facility])-one drop in each eye 4 times daily   Probiotic Product (ALIGN PO) Oral, Daily   QULIPTA 60 MG TABS 1 tablet, Oral, Daily   Qulipta 60 mg, Oral, Daily   tamsulosin (FLOMAX) 0.4 mg, Oral, Daily at bedtime   Travoprost, BAK Free, (TRAVATAN) 0.004 % SOLN ophthalmic solution 1 drop, Daily at bedtime   tretinoin (RETIN-A) 0.025 % cream APPLY A PEA SIZED AMOUNT TO FACE ONCE DAILY IN THE EVENING   UBRELVY 100 MG TABS No dose, route, or frequency recorded.   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   Contact dermatitis 09/10/2021   Hand eczema 09/10/2021   Elective  procedure for unacceptable cosmetic appearance 09/10/2021   Lipoma 09/10/2021   Peripheral venous insufficiency 09/10/2021   Pruritus, unspecified 09/10/2021   Low back pain 07/16/2021   Hip pain, bilateral 06/11/2021   Myalgia 04/08/2021   Vasomotor rhinitis 11/04/2020   Referred otalgia of both ears 07/08/2020   Right ear pain 05/09/2020   Bilateral impacted cerumen 09/13/2019   Excessive sweating 06/13/2019   Anterior epistaxis 08/14/2018   Aphthous ulcer 08/14/2018   Dysfunction of both eustachian tubes 08/14/2018   Recurrent epistaxis 08/14/2018   Chronic kidney  disease (CKD), stage III (moderate) ([place]) 08/01/2018   Anemia, chronic renal failure 08/01/2018   Episcleritis of left eye 02/23/2018   New daily persistent headache 01/10/2018   Temporal arteritis ([place]) 01/10/2018   Bilateral temporomandibular joint pain 01/10/2018   Constipation 10/18/2017   Sensorineural hearing loss (SNHL), bilateral 09/04/2017   Hearing loss 09/01/2017   Tremor of both hands 09/01/2017   Dizziness 09/01/2017   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     Objective:     BP 112/78 (BP Location: Left Arm, Patient Position: Sitting, Cuff Size: Normal)   Pulse 65   Temp 98 F (36.7 C) (Oral)   Ht 5' (1.524 m)   Wt 120 lb 3.2 oz (54.5 kg)   SpO2 98%   BMI 23.47 kg/m    Physical Exam Vitals reviewed.  Constitutional:      Appearance: Normal appearance. She is well-groomed and normal weight.  HENT:     Head: Normocephalic and atraumatic.  Cardiovascular:      Rate and Rhythm: Normal rate and regular rhythm.     Pulses: Normal pulses.     Heart sounds: S1 normal and S2 normal.  Pulmonary:     Effort: Pulmonary effort is normal.     Breath sounds: Normal breath sounds and air entry.  Abdominal:     General: Abdomen is flat. Bowel sounds are normal.  Musculoskeletal:        General: Normal range of motion.     Cervical back: Normal range of motion and neck supple.     Right lower leg: No edema.     Left lower leg: No edema.  Skin:    General: Skin is warm and dry.  Neurological:     Mental Status: She is alert and oriented to person, place, and time. Mental status is at baseline.     Gait: Gait is intact.  Psychiatric:        Mood and Affect: Mood  and affect normal.        Speech: Speech normal.        Behavior: Behavior normal.        Judgment: Judgment normal.      No results found for any visits on 08/17/22.  Last metabolic panel Lab Results  Component Value Date   GLUCOSE 99 04/27/2022   NA 138 04/27/2022   K 3.6 04/27/2022   CL 112 04/27/2022   CO2 17 (L) 04/27/2022   BUN 23 04/27/2022   CREATININE 1.41 (H) 04/27/2022   GFRNONAA 32 (L) 09/03/2020   CALCIUM 9.6 04/27/2022   PROT 8.1 09/03/2020   ALBUMIN 4.9 (H) 09/03/2020   LABGLOB 3.2 09/03/2020   AGRATIO 1.5 09/03/2020   BILITOT <0.2 09/03/2020   ALKPHOS 57 09/03/2020   AST 16 09/03/2020   ALT 14 09/03/2020   ANIONGAP 6 06/29/2018      The ASCVD Risk score (Arnett DK, et al., 2019) failed to calculate for the following reasons:   Cannot find a previous HDL lab   Cannot find a previous total cholesterol lab    Assessment & Plan:   Problem List Items Addressed This Visit       Other   Dizziness - Primary    Intermittent symptoms, chronic and on going, she does get improvement with PRN meclizine but she is technically considered high risk due to her age and concomitant use of daily ativan (rx'd by a different physician). I recommend she use the  meclizine sparingly to reduce her risk of disorientation and falls.       Relevant Medications   meclizine (ANTIVERT) 25 MG tablet   Other Visit Diagnoses     Immunization due       Relevant Orders   Flu Vaccine QUAD High Dose(Fluad) (Completed)       Return in about 1 year (around 08/18/2023) for Annual physical exam.    Farrel Conners, MD

## 2022-08-20 NOTE — Assessment & Plan Note (Addendum)
Intermittent symptoms, chronic and on going, she does get improvement with PRN meclizine but she is technically considered high risk due to her age and concomitant use of daily ativan (rx'd by a different physician). I recommend she use the meclizine sparingly to reduce her risk of disorientation and falls.

## 2022-09-08 DIAGNOSIS — H353221 Exudative age-related macular degeneration, left eye, with active choroidal neovascularization: Secondary | ICD-10-CM | POA: Diagnosis not present

## 2022-09-15 ENCOUNTER — Ambulatory Visit: Payer: Medicare HMO

## 2022-09-22 ENCOUNTER — Ambulatory Visit: Payer: Medicare HMO | Admitting: Obstetrics and Gynecology

## 2022-09-22 DIAGNOSIS — H40051 Ocular hypertension, right eye: Secondary | ICD-10-CM | POA: Diagnosis not present

## 2022-09-23 DIAGNOSIS — H04123 Dry eye syndrome of bilateral lacrimal glands: Secondary | ICD-10-CM | POA: Diagnosis not present

## 2022-09-24 NOTE — Telephone Encounter (Signed)
Last Prolia inj 08/17/22 Next Prolia inj due 02/16/23

## 2022-09-28 DIAGNOSIS — H04129 Dry eye syndrome of unspecified lacrimal gland: Secondary | ICD-10-CM | POA: Diagnosis not present

## 2022-09-28 DIAGNOSIS — H40051 Ocular hypertension, right eye: Secondary | ICD-10-CM | POA: Diagnosis not present

## 2022-10-06 DIAGNOSIS — H353221 Exudative age-related macular degeneration, left eye, with active choroidal neovascularization: Secondary | ICD-10-CM | POA: Diagnosis not present

## 2022-10-20 NOTE — Progress Notes (Signed)
80 y.o. G1P0001 Married White or Caucasian Not Hispanic or Latino female here for annual exam.  She has a bump near her anus. She states that the estrace cream that the urologist gave her causes her to have hot flashes, so she isn't using it.  H/O hysterectomy.   She states that she feels like her vagina/vulva is raw. Not sexually active secondary to discomfort.  No vaginal discharge. Occasional external itching.    She has a h/o a partial colectomy, some fecal incontinence of loose stool. She goes from constipation to loose stools. Wears a pad. She has a lot of staining. She tried metamucil 1 x a day and it caused worsening constipation.   She has mixed incontinence, leaks small amounts daily, wears pads. She had kidney stones removed last year.   No LMP recorded. Patient has had a hysterectomy.          Sexually active: No.  The current method of family planning is status post hysterectomy.    Exercising: No.  The patient does not participate in regular exercise at present. She is having vertigo  Smoker:  no  Health Maintenance: Pap:  years ago h/o hysterectomy  History of abnormal Pap:  no MMG:  09/14/21 density C Bi-rads 1 neg, scheduled  BMD:   08-18-20 osteoporosis-- on Prolia, managed by Endocrinology Colonoscopy: UTD per patient  TDaP:  2016 Gardasil: no   reports that she has never smoked. She has never used smokeless tobacco. She reports that she does not drink alcohol and does not use drugs. She has one son, 4 step children, 6 grandchildren, 4 great grandchildren.   Past Medical History:  Diagnosis Date   Anemia    Anxiety    Arthritis    Benzodiazepine dependence (HCC)    Carotid arterial disease (HCC)    mild   Chronic kidney disease (CKD), stage III (moderate) ([place]) 08/01/2018   -seeing  Nephrology, Dr. 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 Toy Care   Diastolic heart failure [facility])    mild on echo 2016 no cardiologist   GERD (gastroesophageal reflux disease)    IBS (irritable bowel syndrome)    sees Dr. Earlean Shawl   Macular degeneration    goes to [facility] for this   Migraine    managed by neurologist   Osteoporosis    Sty    right eye   Uvulitis    Vertigo    seeing  dr bates for [facility] 08-11-2021 epic   Wears glasses    for reading   Wears hearing aid in both ears     Past Surgical History:  Procedure Laterality Date   [place] Right 04/27/2016   Procedure: CYSTOSCOPY WITH RETROGRADE PYELOGRAM/ RIGHT URETERAL STENT PLACEMENT;  Surgeon: Ardis Hughs, MD;  Location: [facility];  Service: Urology;  Laterality: Right;   CYSTOSCOPY WITH RETROGRADE PYELOGRAM, URETEROSCOPY AND STENT PLACEMENT Left 10/09/2021   Procedure: CYSTOSCOPY WITH LEFT RETROGRADE URETEROSCOPY WITH HOLMIUM LASER AND STENT PLACEMENT;  Surgeon: Irine Seal, MD;  Location: [facility];  Service: Urology;  Laterality:  Left;   ESOPHAGOGASTRODUODENOSCOPY     2002, 2011, 2014, 2021   EXTRACORPOREAL SHOCK WAVE LITHOTRIPSY Left 08/31/2021   Procedure: LEFT EXTRACORPOREAL SHOCK WAVE LITHOTRIPSY (ESWL);  Surgeon: Ardis Hughs, MD;  Location: [facility];  Service: Urology;  Laterality: Left;   [place], 2006, 2011, 2021   HOLMIUM LASER APPLICATION Left 41/93/7902   Procedure: HOLMIUM LASER APPLICATION;  Surgeon: Irine Seal, MD;  Location: [facility];  Service: Urology;  Laterality: Left;   lasix eye surgery     ou   MEMBRANECTOMY Left 09/21/2013   OVARIAN CYST SURGERY     PARS PLANA VITRECTOMY Left 09/21/2013   SMALL INTESTINE SURGERY     SUBTOTAL COLECTOMY     1991, 1994,  2006, 2011   tumor removal right shoulder  06/22/2021    Current Outpatient Medications  Medication Sig Dispense Refill   acetaminophen (TYLENOL) 500 MG tablet Take 1,000 mg by mouth daily as needed for headache.     Apoaequorin (PREVAGEN PO) Take by mouth daily.     Atogepant (QULIPTA) 60 MG TABS Take 60 mg by mouth daily.     brimonidine (ALPHAGAN P) 0.1 % SOLN Apply to eye.     Calcium Carbonate (CALTRATE 600 PO) Take by mouth daily.     citalopram (CELEXA) 40 MG tablet Take 40 mg by mouth daily.     cycloSPORINE (RESTASIS) 0.05 % ophthalmic emulsion Place 1 drop into both eyes 2 (two) times daily.     denosumab (PROLIA) 60 MG/ML SOSY injection Inject 60 mg into the skin every 6 (six) months.     dorzolamidel-timolol (COSOPT) 22.3-6.8 MG/ML SOLN ophthalmic solution 1 drop 2 (two) times daily.     estradiol (ESTRACE) 0.1 MG/GM vaginal cream Place vaginally.     folic acid (FOLVITE) 1 MG tablet Take by mouth.     furosemide (LASIX) 20 MG tablet Take 20 mg by mouth daily.     LINZESS 290 MCG CAPS capsule Take 290 mcg by mouth daily before breakfast.   0   LORazepam (ATIVAN) 0.5 MG tablet Take 0.5 mg by mouth in the morning, at noon, and at bedtime. 1 in am 1 at midday 2 at hs     meclizine (ANTIVERT) 25 MG tablet Take 1 tablet (25 mg total) by mouth 3 (three) times daily as needed for dizziness. 30 tablet 2   olopatadine (PATANOL) 0.1 % ophthalmic solution 1 drop 2 (two) times daily.     PRESCRIPTION MEDICATION Serum eye drops (given by provider at [facility])-one drop in each eye 4 times daily     Probiotic Product (ALIGN PO) Take by mouth daily.     QULIPTA 60 MG TABS Take 1 tablet by mouth daily.     tamsulosin (FLOMAX) 0.4 MG CAPS capsule Take 0.4 mg by mouth at bedtime.     Travoprost, BAK Free, (TRAVATAN) 0.004 % SOLN ophthalmic solution 1 drop at bedtime.     tretinoin (RETIN-A) 0.025 % cream APPLY A PEA SIZED AMOUNT TO FACE ONCE DAILY IN THE EVENING     UBRELVY 100 MG TABS       ZENPEP 15000 units CPEP Take 45,000 mg by mouth 2 (two) times daily.  0   No current facility-administered medications for this visit.    Family History  Problem Relation Age of Onset   Kidney disease Mother    Heart disease Mother    Osteoporosis Mother  Heart Problems Sister        pacemaker   Cancer Maternal Aunt        intestinal and liver cancer   Stroke Maternal Grandmother    Heart disease Maternal Grandmother    Stroke Other    Depression Other        everyone   Hypercalcemia Neg Hx    Breast cancer Neg Hx     Review of Systems  All other systems reviewed and are negative.   Exam:   BP 122/68   Pulse 76   Ht '5\' 1"'$  (1.549 m)   Wt 122 lb (55.3 kg)   SpO2 100%   BMI 23.05 kg/m   Weight change: '@WEIGHTCHANGE'$ @ Height:   Height: '5\' 1"'$  (154.9 cm)  Ht Readings from Last 3 Encounters:  10/28/22 '5\' 1"'$  (1.549 m)  08/17/22 5' (1.524 m)  04/27/22 5' (1.524 m)    General appearance: alert, cooperative and appears stated age Head: Normocephalic, without obvious abnormality, atraumatic Neck: no adenopathy, supple, symmetrical, trachea midline and thyroid normal to inspection and palpation Breasts: normal appearance, no masses or tenderness Abdomen: soft, non-tender; non distended,  no masses,  no organomegaly Extremities: extremities normal, atraumatic, no cyanosis or edema Skin: Skin color, texture, turgor normal. No rashes or lesions Lymph nodes: Cervical, supraclavicular, and axillary nodes normal. No abnormal inguinal nodes palpated Neurologic: Grossly normal   Pelvic: External genitalia:  no lesions, minimal erythema, no lesions              Urethra:  normal appearing urethra with no masses, tenderness or lesions              Bartholins and Skenes: normal                 Vagina: atrophic  appearing vagina with no discharge, no lesions              Cervix: absent               Bimanual Exam:  Uterus:  uterus absent              Adnexa: no mass, fullness,  tenderness               Rectovaginal: Confirms               Anus:  normal sphincter tone, no lesions, + anal tags   1. Encounter for breast and pelvic examination Discussed breast self exam Discussed calcium and vit D intake No pap needed Mammogram scheduled She will check if she needs another colonoscopy  2. Vulvovaginitis She has some vaginal atrophy, but her main area of concern is her vulva. Minimal erythema. Suspect she is irritated from her incontinence - WET PREP FOR TRICH, YEAST, CLUE - betamethasone valerate ointment (VALISONE) 0.1 %; Use a pea sized amount 2 x a day for up to one week. Not for daily long term use. You can use a pea sized amount topically 1-2 x a week as needed.  Dispense: 30 g; Refill: 1 -Discussed vulvar skin care, information given. Don't stay in wet clothes or pads, use Vaseline.   3. Mixed incontinence New, will r/o UTI. If she doesn't have a UTI we discussed referral for pelvic floor PT - Urinalysis, Complete - Urine Culture  4. Incontinence of feces, unspecified fecal incontinence type She goes from constipation to loose stools, only incontinent of loose stools. Got constipation from metamucil -discussed PT

## 2022-10-28 ENCOUNTER — Ambulatory Visit (INDEPENDENT_AMBULATORY_CARE_PROVIDER_SITE_OTHER): Payer: Medicare HMO | Admitting: Obstetrics and Gynecology

## 2022-10-28 ENCOUNTER — Encounter: Payer: Self-pay | Admitting: Obstetrics and Gynecology

## 2022-10-28 VITALS — BP 122/68 | HR 76 | Ht 61.0 in | Wt 122.0 lb

## 2022-10-28 DIAGNOSIS — N3946 Mixed incontinence: Secondary | ICD-10-CM

## 2022-10-28 DIAGNOSIS — N760 Acute vaginitis: Secondary | ICD-10-CM

## 2022-10-28 DIAGNOSIS — R159 Full incontinence of feces: Secondary | ICD-10-CM | POA: Diagnosis not present

## 2022-10-28 DIAGNOSIS — Z01419 Encounter for gynecological examination (general) (routine) without abnormal findings: Secondary | ICD-10-CM | POA: Diagnosis not present

## 2022-10-28 LAB — URINALYSIS, COMPLETE
Bacteria, UA: NONE SEEN /HPF
Bilirubin Urine: NEGATIVE
Glucose, UA: NEGATIVE
Hgb urine dipstick: NEGATIVE
Hyaline Cast: NONE SEEN /LPF
Ketones, ur: NEGATIVE
Leukocytes,Ua: NEGATIVE
Nitrite: NEGATIVE
Specific Gravity, Urine: 1.01 (ref 1.001–1.035)
pH: 6 (ref 5.0–8.0)

## 2022-10-28 LAB — WET PREP FOR TRICH, YEAST, CLUE

## 2022-10-28 MED ORDER — BETAMETHASONE VALERATE 0.1 % EX OINT: TOPICAL_OINTMENT | CUTANEOUS | 1 refills | Status: DC

## 2022-10-28 NOTE — Patient Instructions (Signed)

## 2022-10-29 LAB — URINE CULTURE
MICRO NUMBER:: 14252297
SPECIMEN QUALITY:: ADEQUATE

## 2022-11-02 ENCOUNTER — Telehealth: Payer: Self-pay | Admitting: Physical Medicine and Rehabilitation

## 2022-11-02 NOTE — Telephone Encounter (Signed)
Spoke with patient's husband and he stated the patient is having right hip and groin pain. Informed him that because it has been over a year, she would need to come in for an ov. Scheduled for 11/09/22

## 2022-11-02 NOTE — Telephone Encounter (Signed)
Pt needs an appt for Dr. Ernestina Patches. Please call to schedule..[phone]

## 2022-11-03 DIAGNOSIS — H353221 Exudative age-related macular degeneration, left eye, with active choroidal neovascularization: Secondary | ICD-10-CM | POA: Diagnosis not present

## 2022-11-04 ENCOUNTER — Other Ambulatory Visit: Payer: Self-pay | Admitting: Physical Medicine and Rehabilitation

## 2022-11-04 ENCOUNTER — Telehealth: Payer: Self-pay | Admitting: Physical Medicine and Rehabilitation

## 2022-11-04 DIAGNOSIS — M25551 Pain in right hip: Secondary | ICD-10-CM

## 2022-11-05 ENCOUNTER — Telehealth: Payer: Self-pay | Admitting: Physical Medicine and Rehabilitation

## 2022-11-05 NOTE — Telephone Encounter (Signed)
Pt's husband called to set an appt for pt. He called yesterday and waiting for call. Pt phone number is [phone].

## 2022-11-08 NOTE — Telephone Encounter (Signed)
Spoke with patient's husband and rescheduled for 11/16/22

## 2022-11-09 ENCOUNTER — Telehealth: Payer: Self-pay | Admitting: Physical Medicine and Rehabilitation

## 2022-11-09 ENCOUNTER — Ambulatory Visit: Payer: Medicare HMO | Admitting: Physical Medicine and Rehabilitation

## 2022-11-09 ENCOUNTER — Ambulatory Visit: Payer: Medicare HMO | Admitting: Internal Medicine

## 2022-11-09 NOTE — Telephone Encounter (Signed)
Spoke with patient's husband and he stated he had the date wrong and the current date and time is fine

## 2022-11-09 NOTE — Telephone Encounter (Signed)
Pt's husband [name] called asking if Dr Ernestina Patches had a later time appt on that day for pt. Please call pt's husband at [phone]

## 2022-11-12 IMAGING — MR MR LUMBAR SPINE W/O CM
4 of 5 series · 26 of 48 positions shown · non-contrast
Comparison: None.

CLINICAL DATA: Low back pain, > 6 wks. Bilateral hip pain

EXAM:
MRI LUMBAR SPINE WITHOUT CONTRAST
TECHNIQUE: Multiplanar, multisequence MR imaging of the lumbar spine was
performed. No intravenous contrast was administered.

[Series 2: T2 · sagittal · 4.0mm · 0.53mm/px · 6 of 15 slices shown (1 of 2)]
[im 1/15]
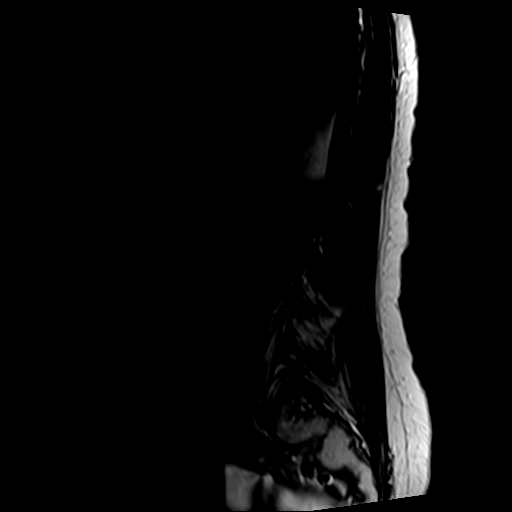
[im 3/15]
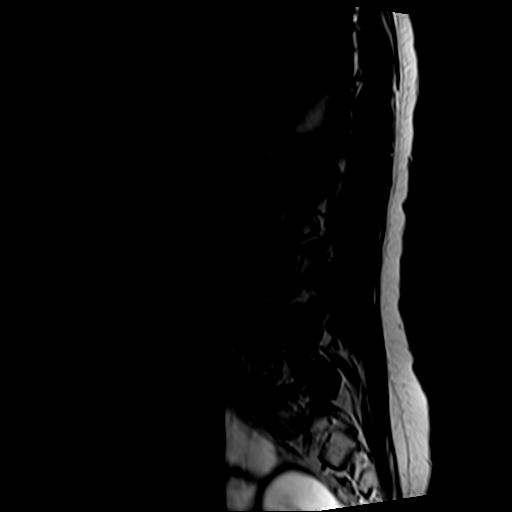
[im 6/15]
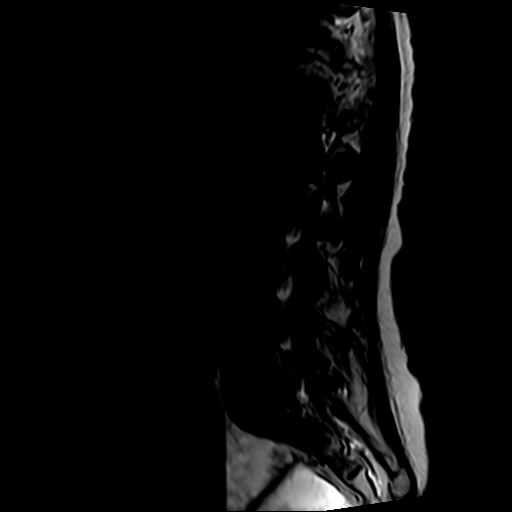
[im 9/15]
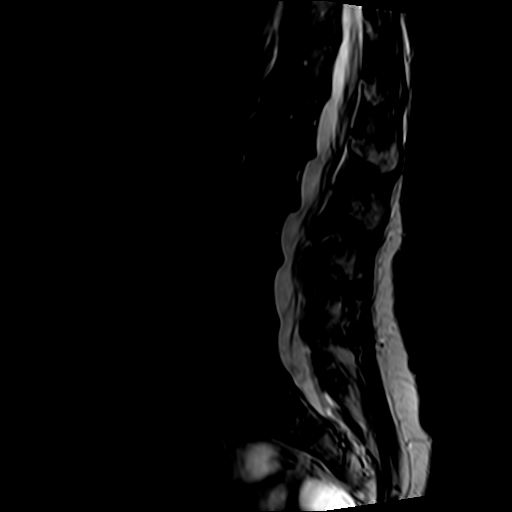
[im 12/15]
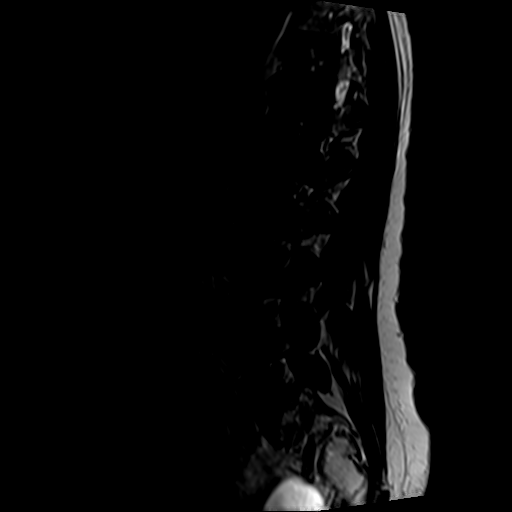
[im 15/15]
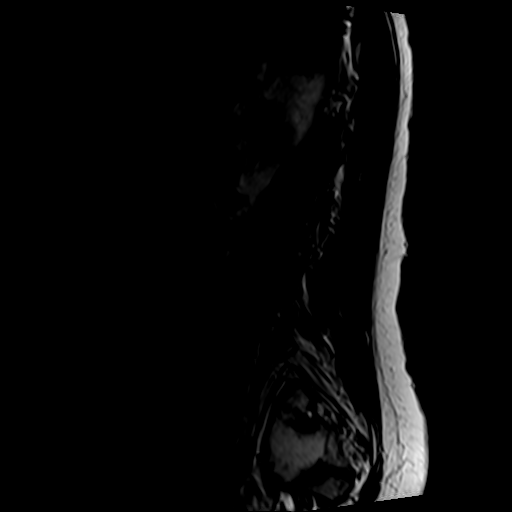

[Series 4: T1 · sagittal · 4.0mm · 0.53mm/px · 6 of 15 slices shown (1 of 2)]
[im 1/15]
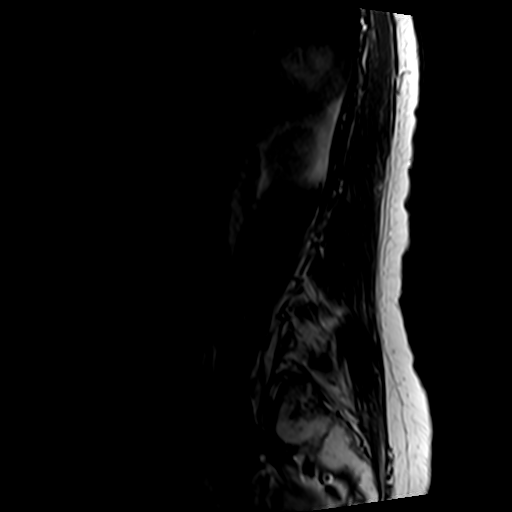
[im 3/15]
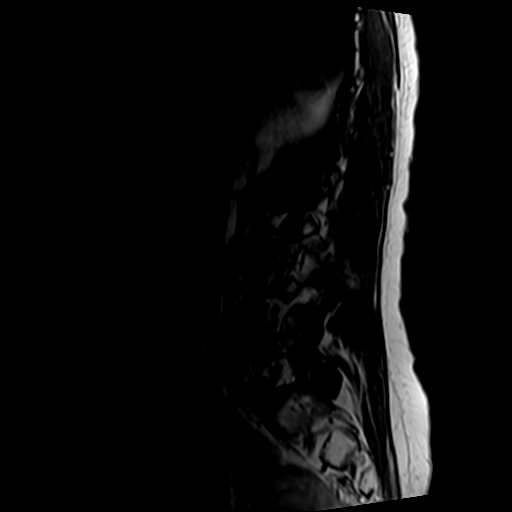
[im 6/15]
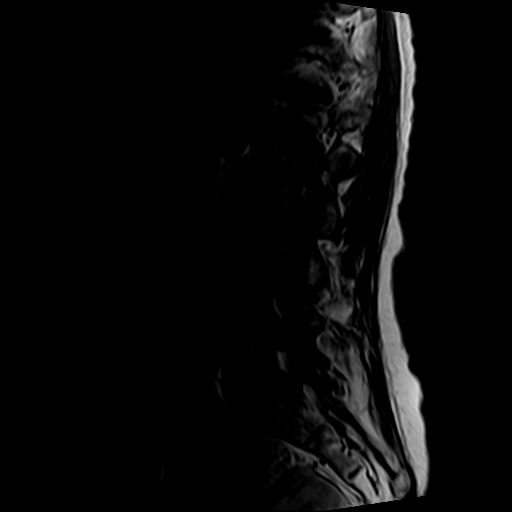
[im 9/15]
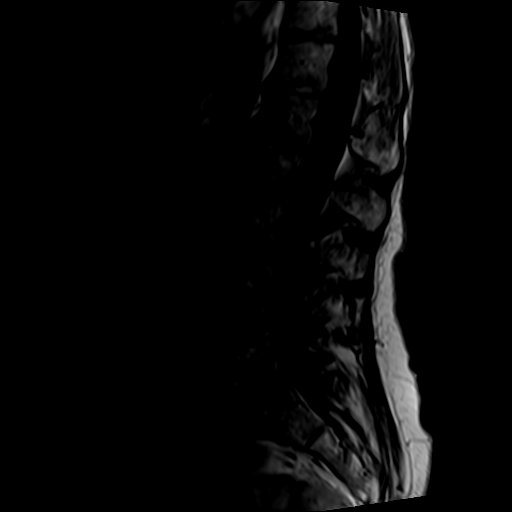
[im 12/15]
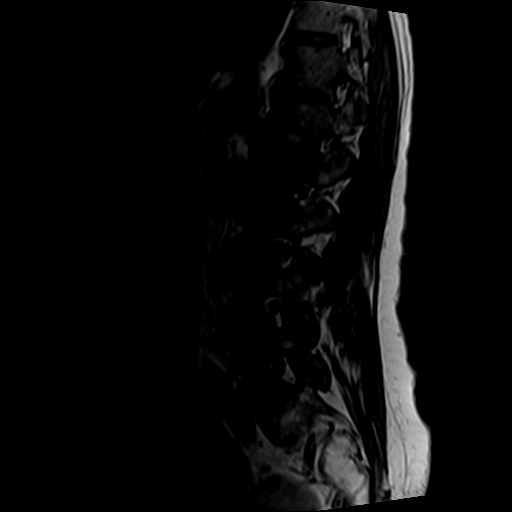
[im 15/15]
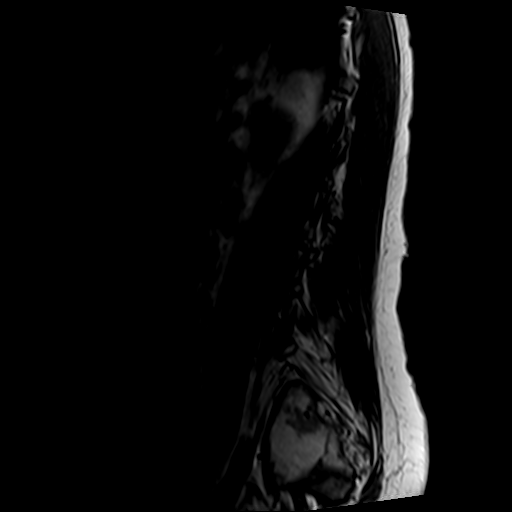

[Series 5: T2 · axial · 4.0mm · 0.70mm/px · z∈[-69,+133]mm · 9 of 35 slices shown (2 of 2)]
[im 1/35]
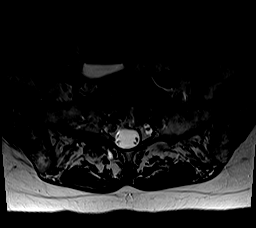
[im 5/35]
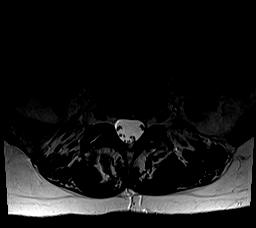
[im 10/35]
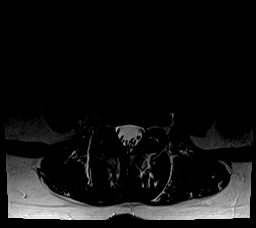
[im 15/35]
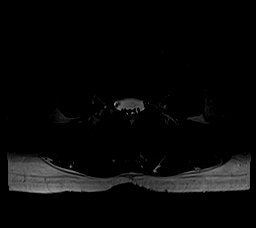
[im 18/35]
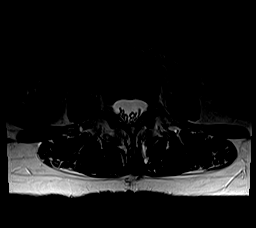
[im 20/35]
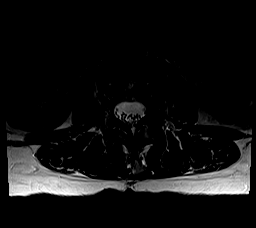
[im 25/35]
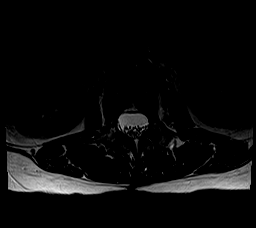
[im 30/35]
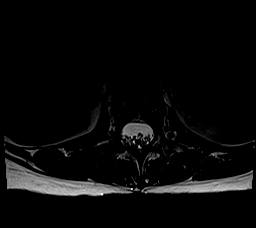
[im 35/35]
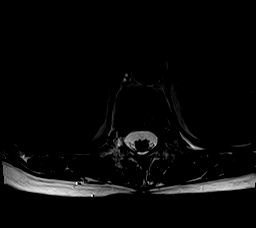

[Series 6: T1 · axial · 4.0mm · 0.35mm/px · z∈[-69,+107]mm · 5 of 35 slices shown (2 of 2)]
[im 1/35]
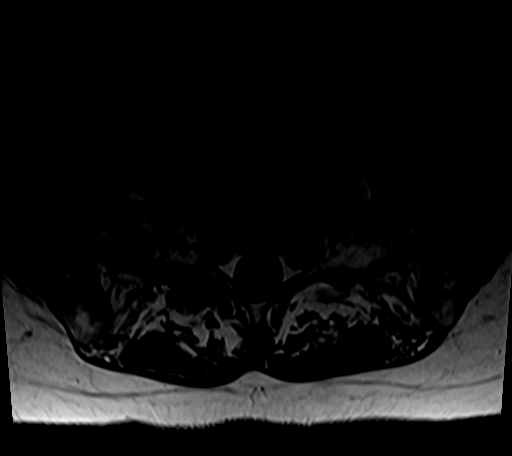
[im 5/35]
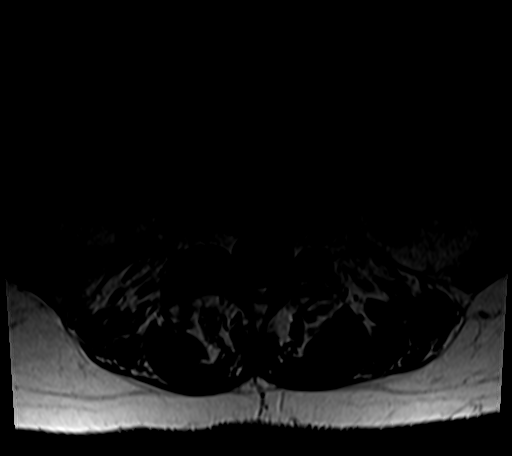
[im 10/35]
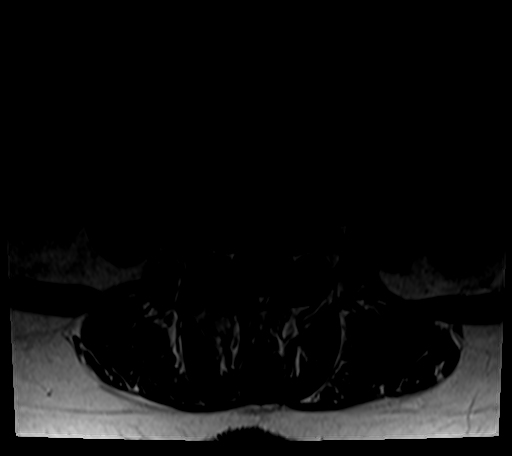
[im 18/35]
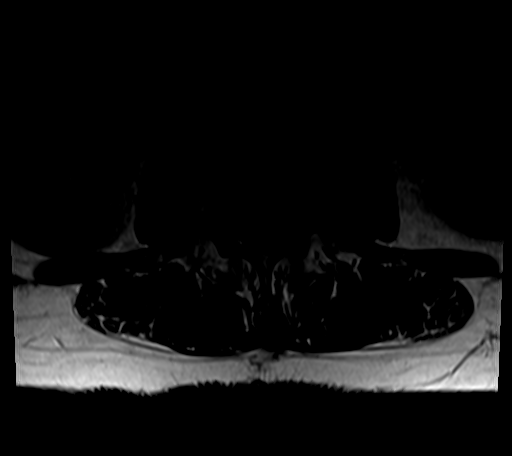
[im 30/35]
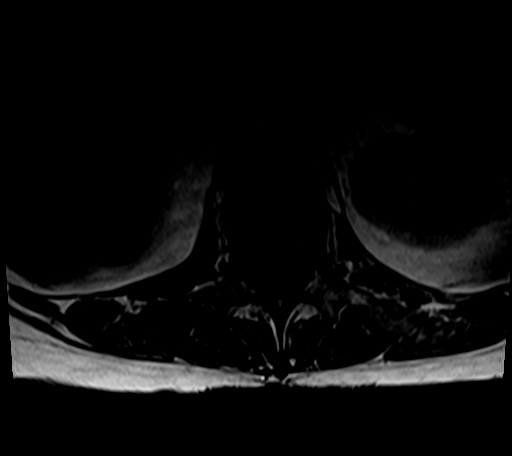

[26 of 48 positions shown; findings below may reference images not displayed]

FINDINGS: Segmentation:  Standard.

Alignment:  CT small retrolisthesis of L1 over L2 and L2 over L3.

Vertebrae:  No fracture, evidence of discitis, or bone lesion.

Conus medullaris and cauda equina: Conus extends to the L1 level.
Conus and cauda equina appear normal.

Paraspinal and other soft tissues: Negative.

Disc levels:

T12-L1: No spinal canal or neural foraminal stenosis.

L1-2: Disc bulge and mild facet degenerative changes resulting in
mild bilateral neural foraminal narrowing. No significant spinal
canal stenosis.

L2-3: Disc bulge and mild facet degenerative changes resulting in
mild bilateral foraminal narrowing. No spinal canal or neural
foraminal stenosis.

L3-4: Disc bulge and mild facet degenerative changes without
significant spinal canal or neural foraminal stenosis.

L4-5: Disc bulge and mild facet degenerative changes without
significant spinal canal or neural foraminal stenosis.

L5-S1: Moderate right and mild left facet degenerative changes. No
spinal canal or neural foraminal stenosis.
IMPRESSION: Mild degenerative changes of the lumbar spine with mild bilateral
neural foraminal narrowing at L1-2 and L2-3. No significant spinal
canal stenosis at any level.

## 2022-11-16 ENCOUNTER — Ambulatory Visit: Payer: Medicare HMO | Admitting: Physical Medicine and Rehabilitation

## 2022-11-16 DIAGNOSIS — H401133 Primary open-angle glaucoma, bilateral, severe stage: Secondary | ICD-10-CM | POA: Diagnosis not present

## 2022-11-16 DIAGNOSIS — H353221 Exudative age-related macular degeneration, left eye, with active choroidal neovascularization: Secondary | ICD-10-CM | POA: Diagnosis not present

## 2022-11-17 ENCOUNTER — Ambulatory Visit: Payer: Self-pay

## 2022-11-17 ENCOUNTER — Ambulatory Visit (INDEPENDENT_AMBULATORY_CARE_PROVIDER_SITE_OTHER): Payer: Medicare HMO | Admitting: Physical Medicine and Rehabilitation

## 2022-11-17 DIAGNOSIS — M25551 Pain in right hip: Secondary | ICD-10-CM

## 2022-11-17 NOTE — Progress Notes (Signed)
Functional Pain Scale - descriptive words and definitions  Unmanageable (7)  Pain interferes with normal ADL's/nothing seems to help/sleep is very difficult/active distractions are very difficult to concentrate on. Severe range order  Average Pain 7   +Driver, -BT, -Dye Allergies.  Right hip pain

## 2022-11-17 NOTE — Progress Notes (Signed)
   Danielle Gaines - 81 y.o. female MRN [id number]  Date of birth: Oct 08, 1941  Office Visit Note: Visit Date: 11/17/2022 PCP: Farrel Conners, MD Referred by: Farrel Conners, MD  Subjective: Chief Complaint  Patient presents with   Right Hip - Pain   HPI:  Danielle Gaines is a 81 y.o. female who comes in today for planned repeat Right anesthetic hip arthrogram with fluoroscopic guidance.  The patient has failed conservative care including home exercise, medications, time and activity modification. Prior injection gave more than 50% relief for several months. This injection will be diagnostic and hopefully therapeutic.  Please see requesting physician notes for further details and justification.  Referring: Dr. Joni Fears   ROS Otherwise per HPI.  Assessment & Plan: Visit Diagnoses:    ICD-10-CM   1. Pain in right hip  M25.551 Large Joint Inj: R hip joint    XR C-ARM NO REPORT      Plan: No additional findings.   Meds & Orders: No orders of the defined types were placed in this encounter.   Orders Placed This Encounter  Procedures   Large Joint Inj: R hip joint   XR C-ARM NO REPORT    Follow-up: No follow-ups on file.   Procedures: Large Joint Inj: R hip joint on 11/17/2022 3:28 PM Indications: diagnostic evaluation and pain Details: 22 G 3.5 in needle, fluoroscopy-guided anterior approach  Arthrogram: No  Medications: 4 mL bupivacaine 0.25 %; 40 mg triamcinolone acetonide 40 MG/ML Outcome: tolerated well, no immediate complications  There was excellent flow of contrast producing a partial arthrogram of the hip. The patient did have relief of symptoms during the anesthetic phase of the injection. Procedure, treatment alternatives, risks and benefits explained, specific risks discussed. Consent was given by the patient. Immediately prior to procedure a time out was called to verify the correct patient, procedure, equipment, support staff and site/side marked as  required. Patient was prepped and draped in the usual sterile fashion.          Clinical History: No specialty comments available.     Objective:  VS:  HT:    WT:   BMI:     BP:   HR: bpm  TEMP: ( )  RESP:  Physical Exam   Imaging: No results found.

## 2022-11-27 MED ORDER — TRIAMCINOLONE ACETONIDE 40 MG/ML IJ SUSP
40.0000 mg | INTRAMUSCULAR | Status: AC | PRN
  Administered 2022-11-17: 40 mg via INTRA_ARTICULAR

## 2022-11-27 MED ORDER — BUPIVACAINE HCL 0.25 % IJ SOLN
4.0000 mL | INTRAMUSCULAR | Status: AC | PRN
  Administered 2022-11-17: 4 mL via INTRA_ARTICULAR

## 2022-11-30 ENCOUNTER — Ambulatory Visit: Payer: Medicare HMO

## 2022-12-01 DIAGNOSIS — H401131 Primary open-angle glaucoma, bilateral, mild stage: Secondary | ICD-10-CM | POA: Diagnosis not present

## 2022-12-01 DIAGNOSIS — H02052 Trichiasis without entropian right lower eyelid: Secondary | ICD-10-CM | POA: Diagnosis not present

## 2022-12-01 DIAGNOSIS — H353221 Exudative age-related macular degeneration, left eye, with active choroidal neovascularization: Secondary | ICD-10-CM | POA: Diagnosis not present

## 2022-12-01 DIAGNOSIS — H353212 Exudative age-related macular degeneration, right eye, with inactive choroidal neovascularization: Secondary | ICD-10-CM | POA: Diagnosis not present

## 2022-12-14 DIAGNOSIS — H401133 Primary open-angle glaucoma, bilateral, severe stage: Secondary | ICD-10-CM | POA: Diagnosis not present

## 2022-12-14 DIAGNOSIS — H353221 Exudative age-related macular degeneration, left eye, with active choroidal neovascularization: Secondary | ICD-10-CM | POA: Diagnosis not present

## 2022-12-17 ENCOUNTER — Ambulatory Visit: Payer: Medicare HMO

## 2022-12-27 DIAGNOSIS — H401113 Primary open-angle glaucoma, right eye, severe stage: Secondary | ICD-10-CM | POA: Diagnosis not present

## 2022-12-27 DIAGNOSIS — H409 Unspecified glaucoma: Secondary | ICD-10-CM | POA: Diagnosis not present

## 2022-12-28 ENCOUNTER — Ambulatory Visit: Payer: Medicare HMO

## 2022-12-29 DIAGNOSIS — H353221 Exudative age-related macular degeneration, left eye, with active choroidal neovascularization: Secondary | ICD-10-CM | POA: Diagnosis not present

## 2022-12-31 NOTE — Telephone Encounter (Signed)
Prolia VOB initiated via [email or web address]  Last Prolia inj 08/17/22 Next Prolia inj due 02/16/23

## 2023-01-14 DIAGNOSIS — N1831 Chronic kidney disease, stage 3a: Secondary | ICD-10-CM | POA: Diagnosis not present

## 2023-01-18 ENCOUNTER — Ambulatory Visit: Payer: Medicare HMO

## 2023-01-20 DIAGNOSIS — N1831 Chronic kidney disease, stage 3a: Secondary | ICD-10-CM | POA: Diagnosis not present

## 2023-01-20 DIAGNOSIS — N2581 Secondary hyperparathyroidism of renal origin: Secondary | ICD-10-CM | POA: Diagnosis not present

## 2023-01-20 DIAGNOSIS — D631 Anemia in chronic kidney disease: Secondary | ICD-10-CM | POA: Diagnosis not present

## 2023-01-24 NOTE — Telephone Encounter (Signed)
Prior auth required for PROLIA ? ?PA PROCESS DETAILS: PA is required. PA can be initiated by calling [phone] or online at ?[email or web address] ? ?

## 2023-01-26 DIAGNOSIS — H353221 Exudative age-related macular degeneration, left eye, with active choroidal neovascularization: Secondary | ICD-10-CM | POA: Diagnosis not present

## 2023-02-07 ENCOUNTER — Telehealth: Payer: Self-pay | Admitting: Family Medicine

## 2023-02-07 NOTE — Telephone Encounter (Signed)
Called patient to schedule Medicare Annual Wellness Visit (AWV). Left message for patient to call back and schedule Medicare Annual Wellness Visit (AWV).  Last date of AWV: 08/18/17  Please schedule an appointment at any time with [facility] or [employer].  If any questions, please contact me at [phone].  Thank you ,  Barkley Boards AWV direct phone # [phone]

## 2023-02-07 NOTE — Telephone Encounter (Signed)
Prior Authorization initiated for PROLIA via [email or web address] KEY: [id number]

## 2023-02-10 NOTE — Telephone Encounter (Signed)
APPROVED  [employer] PA Case ID #: [id number] Valid: 05/16/20-11/29/23

## 2023-02-10 NOTE — Telephone Encounter (Signed)
Pt ready for scheduling on or after 02/16/23  Out-of-pocket cost due at time of visit: $327  Primary: [employer] Gold Plus HMO Prolia co-insurance: 20% (approximately $302) Admin fee co-insurance: 20% (approximately $25)  Deductible: does not apply  Prior Auth: APPROVED PA Case ID #: [id number] Valid: 05/16/20-11/29/23  Secondary: n/a Prolia co-insurance:  Admin fee co-insurance:   Deductible:   Prior Auth:  PA# Valid:   ** This summary of benefits is an estimation of the patient's out-of-pocket cost. Exact cost may vary based on individual plan coverage.

## 2023-02-15 ENCOUNTER — Ambulatory Visit: Payer: Medicare HMO

## 2023-03-07 ENCOUNTER — Other Ambulatory Visit: Payer: Self-pay | Admitting: Family Medicine

## 2023-03-07 DIAGNOSIS — R42 Dizziness and giddiness: Secondary | ICD-10-CM

## 2023-03-09 DIAGNOSIS — H353231 Exudative age-related macular degeneration, bilateral, with active choroidal neovascularization: Secondary | ICD-10-CM | POA: Diagnosis not present

## 2023-03-09 DIAGNOSIS — Z09 Encounter for follow-up examination after completed treatment for conditions other than malignant neoplasm: Secondary | ICD-10-CM | POA: Diagnosis not present

## 2023-03-16 NOTE — Telephone Encounter (Signed)
LM x1 for patient to call office to schedule next Prolia injection.

## 2023-03-18 NOTE — Telephone Encounter (Signed)
Patient is scheduled for 03/29/2023 at 1:30 PM for next Prolia injection.

## 2023-03-29 ENCOUNTER — Ambulatory Visit: Payer: Medicare HMO

## 2023-03-30 ENCOUNTER — Ambulatory Visit: Payer: Medicare HMO

## 2023-04-05 ENCOUNTER — Ambulatory Visit
Admission: RE | Admit: 2023-04-05 | Discharge: 2023-04-05 | Disposition: A | Discharge status: Home / Self Care | Payer: Medicare HMO | Source: Ambulatory Visit | Attending: Family Medicine | Admitting: Family Medicine

## 2023-04-05 DIAGNOSIS — Z1231 Encounter for screening mammogram for malignant neoplasm of breast: Secondary | ICD-10-CM

## 2023-04-06 ENCOUNTER — Telehealth: Payer: Self-pay | Admitting: Family Medicine

## 2023-04-06 NOTE — Telephone Encounter (Signed)
Called patient to schedule Medicare Annual Wellness Visit (AWV). Left message for patient to call back and schedule Medicare Annual Wellness Visit (AWV).  Last date of AWV: 08/18/17  Please schedule an appointment at any time with [facility] or [employer].  If any questions, please contact me at [phone].  Thank you ,  Rudell Cobb AWV direct phone # [phone]

## 2023-04-12 ENCOUNTER — Ambulatory Visit: Payer: Medicare HMO

## 2023-04-12 VITALS — BP 120/70 | HR 83 | Ht 61.0 in | Wt 137.0 lb

## 2023-04-12 DIAGNOSIS — J300 Vasomotor rhinitis: Secondary | ICD-10-CM | POA: Diagnosis not present

## 2023-04-12 DIAGNOSIS — M810 Age-related osteoporosis without current pathological fracture: Secondary | ICD-10-CM

## 2023-04-12 DIAGNOSIS — H903 Sensorineural hearing loss, bilateral: Secondary | ICD-10-CM | POA: Diagnosis not present

## 2023-04-12 DIAGNOSIS — H938X3 Other specified disorders of ear, bilateral: Secondary | ICD-10-CM | POA: Diagnosis not present

## 2023-04-12 MED ORDER — DENOSUMAB 60 MG/ML ~~LOC~~ SOSY
60.0000 mg | PREFILLED_SYRINGE | Freq: Once | SUBCUTANEOUS | Status: AC
  Administered 2023-04-12: 60 mg via SUBCUTANEOUS

## 2023-04-12 NOTE — Progress Notes (Signed)
Patient verbally confirmed name, date of birth, and correct medication to be administered. Prolia injection administered and pt tolerated well.  

## 2023-04-13 ENCOUNTER — Telehealth: Payer: Self-pay | Admitting: Physical Medicine and Rehabilitation

## 2023-04-13 DIAGNOSIS — H401133 Primary open-angle glaucoma, bilateral, severe stage: Secondary | ICD-10-CM | POA: Diagnosis not present

## 2023-04-13 DIAGNOSIS — H353231 Exudative age-related macular degeneration, bilateral, with active choroidal neovascularization: Secondary | ICD-10-CM | POA: Diagnosis not present

## 2023-04-13 DIAGNOSIS — M25551 Pain in right hip: Secondary | ICD-10-CM

## 2023-04-13 NOTE — Telephone Encounter (Signed)
Patient asking for an injection in her back. Please advise

## 2023-04-14 ENCOUNTER — Other Ambulatory Visit: Payer: Self-pay | Admitting: Physical Medicine and Rehabilitation

## 2023-04-14 ENCOUNTER — Telehealth: Payer: Self-pay | Admitting: Physical Medicine and Rehabilitation

## 2023-04-14 NOTE — Telephone Encounter (Signed)
Patient's husband Dorene Sorrow called needing to schedule an appointment for his wife with Dr. Alvester Morin for her hip and groin. The number to contact Dorene Sorrow is [phone]

## 2023-04-14 NOTE — Telephone Encounter (Signed)
Spoke with patient's husband and he stated the patient is having the same type of pain as before in the right hip. They are requesting another injection. No new falls, accidents or injuries. Please advise

## 2023-04-14 NOTE — Telephone Encounter (Signed)
See previous encounter

## 2023-04-15 NOTE — Telephone Encounter (Signed)
Repeat right hip injection placed

## 2023-04-15 NOTE — Addendum Note (Signed)
Addended by: Sharlet Salina on: 04/15/2023 01:21 PM   Modules accepted: Orders

## 2023-04-20 ENCOUNTER — Ambulatory Visit: Payer: Medicare HMO | Admitting: Physical Medicine and Rehabilitation

## 2023-04-21 ENCOUNTER — Telehealth: Payer: Self-pay | Admitting: Physical Medicine and Rehabilitation

## 2023-04-21 NOTE — Telephone Encounter (Signed)
Pt husband called to schedule appt for back injection. Please call pt at [phone].

## 2023-04-22 DIAGNOSIS — H353231 Exudative age-related macular degeneration, bilateral, with active choroidal neovascularization: Secondary | ICD-10-CM | POA: Diagnosis not present

## 2023-04-22 DIAGNOSIS — H401133 Primary open-angle glaucoma, bilateral, severe stage: Secondary | ICD-10-CM | POA: Diagnosis not present

## 2023-04-22 DIAGNOSIS — H04123 Dry eye syndrome of bilateral lacrimal glands: Secondary | ICD-10-CM | POA: Diagnosis not present

## 2023-04-22 NOTE — Telephone Encounter (Signed)
LVM to return call to reschedule injection 

## 2023-04-26 ENCOUNTER — Telehealth: Payer: Self-pay | Admitting: Physical Medicine and Rehabilitation

## 2023-04-26 NOTE — Telephone Encounter (Signed)
Patient asking for an injection for her back. Please advise

## 2023-04-26 NOTE — Telephone Encounter (Signed)
Spoke with patient's husband and rescheduled injection for 05/03/23.

## 2023-04-29 NOTE — Telephone Encounter (Signed)
Last Prolia inj 04/12/23 Next Prolia inj due 10/14/23 

## 2023-05-03 ENCOUNTER — Ambulatory Visit (INDEPENDENT_AMBULATORY_CARE_PROVIDER_SITE_OTHER): Payer: Medicare HMO | Admitting: Physical Medicine and Rehabilitation

## 2023-05-03 ENCOUNTER — Other Ambulatory Visit: Payer: Self-pay

## 2023-05-03 DIAGNOSIS — M25551 Pain in right hip: Secondary | ICD-10-CM

## 2023-05-03 NOTE — Progress Notes (Unsigned)
Functional Pain Scale - descriptive words and definitions  Unmanageable (7)  Pain interferes with normal ADL's/nothing seems to help/sleep is very difficult/active distractions are very difficult to concentrate on. Severe range order  Average Pain 8   +Driver, -BT, -Dye Allergies.  Pain in right hip

## 2023-05-03 NOTE — Progress Notes (Unsigned)
   Danielle Gaines - 82 y.o. female MRN [id number]  Date of birth: 28-Jul-1941  Office Visit Note: Visit Date: 05/03/2023 PCP: Karie Georges, MD Referred by: Karie Georges, MD  Subjective: No chief complaint on file.  HPI:  Danielle Gaines is a 82 y.o. female who comes in todayHPI ROS Otherwise per HPI.  Assessment & Plan: Visit Diagnoses: No diagnosis found.  Plan: No additional findings.   Meds & Orders: No orders of the defined types were placed in this encounter.  No orders of the defined types were placed in this encounter.   Follow-up: No follow-ups on file.   Procedures: Large Joint Inj: R hip joint on 05/03/2023 1:17 PM Indications: diagnostic evaluation and pain Details: 22 G 3.5 in needle, fluoroscopy-guided anterior approach  Arthrogram: No  Medications: 4 mL bupivacaine 0.25 %; 40 mg triamcinolone acetonide 40 MG/ML Outcome: tolerated well, no immediate complications  There was excellent flow of contrast producing a partial arthrogram of the hip. The patient did have relief of symptoms during the anesthetic phase of the injection. Procedure, treatment alternatives, risks and benefits explained, specific risks discussed. Consent was given by the patient. Immediately prior to procedure a time out was called to verify the correct patient, procedure, equipment, support staff and site/side marked as required. Patient was prepped and draped in the usual sterile fashion.          Clinical History: No specialty comments available.     Objective:  VS:  HT:    WT:   BMI:     BP:   HR: bpm  TEMP: ( )  RESP:  Physical Exam   Imaging: No results found.

## 2023-05-04 DIAGNOSIS — N200 Calculus of kidney: Secondary | ICD-10-CM | POA: Diagnosis not present

## 2023-05-04 DIAGNOSIS — R109 Unspecified abdominal pain: Secondary | ICD-10-CM | POA: Diagnosis not present

## 2023-05-04 MED ORDER — BUPIVACAINE HCL 0.25 % IJ SOLN
4.0000 mL | INTRAMUSCULAR | Status: AC | PRN
Start: 2023-05-03 — End: 2023-05-03
  Administered 2023-05-03: 4 mL via INTRA_ARTICULAR

## 2023-05-04 MED ORDER — TRIAMCINOLONE ACETONIDE 40 MG/ML IJ SUSP
40.0000 mg | INTRAMUSCULAR | Status: AC | PRN
Start: 2023-05-03 — End: 2023-05-03
  Administered 2023-05-03: 40 mg via INTRA_ARTICULAR

## 2023-05-05 ENCOUNTER — Ambulatory Visit: Payer: Medicare HMO | Admitting: Physical Medicine and Rehabilitation

## 2023-05-10 DIAGNOSIS — H353231 Exudative age-related macular degeneration, bilateral, with active choroidal neovascularization: Secondary | ICD-10-CM | POA: Diagnosis not present

## 2023-05-10 DIAGNOSIS — H401133 Primary open-angle glaucoma, bilateral, severe stage: Secondary | ICD-10-CM | POA: Diagnosis not present

## 2023-05-10 DIAGNOSIS — H04123 Dry eye syndrome of bilateral lacrimal glands: Secondary | ICD-10-CM | POA: Diagnosis not present

## 2023-05-17 ENCOUNTER — Ambulatory Visit (INDEPENDENT_AMBULATORY_CARE_PROVIDER_SITE_OTHER): Payer: Medicare HMO | Admitting: Family Medicine

## 2023-05-17 VITALS — BP 132/78 | HR 85 | Temp 97.9°F | Ht 61.0 in | Wt 141.9 lb

## 2023-05-17 DIAGNOSIS — R110 Nausea: Secondary | ICD-10-CM

## 2023-05-17 DIAGNOSIS — R1084 Generalized abdominal pain: Secondary | ICD-10-CM

## 2023-05-17 DIAGNOSIS — E782 Mixed hyperlipidemia: Secondary | ICD-10-CM

## 2023-05-17 DIAGNOSIS — R0609 Other forms of dyspnea: Secondary | ICD-10-CM | POA: Diagnosis not present

## 2023-05-17 DIAGNOSIS — K219 Gastro-esophageal reflux disease without esophagitis: Secondary | ICD-10-CM

## 2023-05-17 MED ORDER — ONDANSETRON HCL 4 MG PO TABS: 4.0000 mg | ORAL_TABLET | Freq: Three times a day (TID) | ORAL | 1 refills | Status: DC | PRN

## 2023-05-17 MED ORDER — PANTOPRAZOLE SODIUM 40 MG PO TBEC
40.0000 mg | DELAYED_RELEASE_TABLET | Freq: Every day | ORAL | 2 refills | Status: DC
Start: 2023-05-17 — End: 2023-11-08

## 2023-05-17 NOTE — Progress Notes (Unsigned)
Acute Office Visit  Subjective:     Patient ID: Danielle Gaines, female    DOB: 12/11/1940, 82 y.o.   MRN: [id number]  Chief Complaint  Patient presents with   Back Pain   Abdominal Pain    Patient complains of abdominal pain, x2 weeks, Patient reports no fever but some diarrhea    Back Pain Associated symptoms include abdominal pain.  Abdominal Pain   Patient is in today for abdominal pain and chest pain. She reports that she feels like she is going to "burst" she gets short winded as well. States that it is worse when she eats or drinks something. She gest very bloated feeling. Feels like it is coming back up unto her throat. Has been taking a lot of TUMS for the past few weeks, states she continues to take Zenpep four times a day with her meals due to the pancreatic insufficiency. There is a considerable amount of nausea. 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   Dr. Jola Babinski will be her new GI doctor. She has an appt with her in August.   Review of Systems  Gastrointestinal:  Positive for abdominal pain.  Musculoskeletal:  Positive for back pain.  All other systems reviewed and are negative.  Current Outpatient Medications  Medication Instructions   acetaminophen (TYLENOL) 1,000 mg, Oral, Daily PRN   Apoaequorin (PREVAGEN PO) Oral, Daily   betamethasone valerate ointment (VALISONE) 0.1 % Use a pea sized amount 2 x a day for up to one week. Not for daily long term use. You can use a pea sized amount topically 1-2 x a week as needed.   brimonidine (ALPHAGAN P) 0.1 % SOLN Ophthalmic   cycloSPORINE (RESTASIS) 0.05 % ophthalmic emulsion 1 drop, Both Eyes, 2 times  daily   denosumab (PROLIA) 60 mg, Subcutaneous, Every 6 months   dorzolamidel-timolol (COSOPT) 22.3-6.8 MG/ML SOLN ophthalmic solution 1 drop, 2 times daily   estradiol (ESTRACE) 0.1 MG/GM vaginal cream Place vaginally.   folic acid (FOLVITE) 1 MG tablet Oral   furosemide (LASIX) 20 mg, Oral, Daily   Linzess 290 mcg, Oral, Daily before breakfast   LORazepam (ATIVAN) 0.5 mg, Oral, 3 times daily, 1 in am<BR>1 at midday<BR>2 at hs   meclizine (ANTIVERT) 25 MG tablet TAKE 1 TABLET(25 MG) BY MOUTH THREE TIMES DAILY AS NEEDED FOR DIZZINESS   neomycin-polymyxin b-dexamethasone (MAXITROL) 3.5-10000-0.1 OINT    olopatadine (PATANOL) 0.1 % ophthalmic solution 1 drop, 2 times daily   pantoprazole (PROTONIX) 40 mg, Oral, Daily   Probiotic Product (ALIGN PO) Oral, Daily   QULIPTA 60 MG TABS 1 tablet, Oral, Daily   Qulipta 60 mg, Oral, Daily   tamsulosin (FLOMAX) 0.4 mg, Oral, Daily at bedtime   Travoprost, BAK Free, (TRAVATAN) 0.004 % SOLN ophthalmic solution 1 drop, Daily at bedtime   tretinoin (RETIN-A) 0.025 % cream APPLY A PEA SIZED AMOUNT TO FACE ONCE DAILY IN THE EVENING   UBRELVY 100 MG TABS No dose, route, or frequency recorded.   ZENPEP 15000 units CPEP 45,000 mg, Oral, 2 times daily        Objective:    BP 132/78 (BP Location: Left Arm, Patient  Position: Sitting, Cuff Size: Normal)   Pulse 85   Temp 97.9 F (36.6 C) (Oral)   Ht 5\' 1"  (1.549 m)   Wt 141 lb 14.4 oz (64.4 kg)   SpO2 98%   BMI 26.81 kg/m    Physical Exam Vitals reviewed.  Constitutional:      Appearance: Normal appearance. She is well-groomed and normal weight.  Eyes:     Conjunctiva/sclera: Conjunctivae normal.  Cardiovascular:     Rate and Rhythm: Normal rate and regular rhythm.     Pulses: Normal pulses.     Heart sounds: S1 normal and S2 normal.  Pulmonary:     Effort: Pulmonary effort is normal.     Breath sounds: Normal breath sounds and air entry.  Abdominal:     General: Abdomen is flat. Bowel  sounds are normal. There is no distension.     Palpations: Abdomen is soft.     Tenderness: There is abdominal tenderness in the right lower quadrant, epigastric area and left lower quadrant. There is no guarding or rebound.  Musculoskeletal:     Right lower leg: No edema.     Left lower leg: No edema.  Neurological:     Mental Status: She is alert and oriented to person, place, and time. Mental status is at baseline.     Gait: Gait is intact.  Psychiatric:        Mood and Affect: Mood and affect normal.        Speech: Speech normal.        Behavior: Behavior normal.        Judgment: Judgment normal.     No results found for any visits on 05/17/23.      Assessment & Plan:   Problem List Items Addressed This Visit       Unprioritized   GERD (gastroesophageal reflux disease)   Relevant Medications   pantoprazole (PROTONIX) 40 MG tablet   Other Visit Diagnoses     Generalized abdominal pain    -  Primary   Relevant Orders   Unclear etiology, it could be from abdominal adhesions from her multiple previous surgeries, there is no acute weight loss, no blood in stool, no vomiting. No distension of the abdomen on exam. I will order pantoprazole and also PRN ondansetron 4 mg every 8 hours as needed for nausea. Will obtain labs and order a CT without contrast of the abd/pelvis to look at the bowel gas pattern and to rule out any obstructions. Checking lipase also.   CMP   Lipase   CT Abdomen Pelvis Wo Contrast   Nausea       Relevant Orders   CT Abdomen Pelvis Wo Contrast   Dyspnea on exertion       Relevant Orders   It is concerning that she is having dyspnea with exertion and decreased exercise tolerance. It might be related to the abdominal symptoms however it could also be a separate issue. Or a change in cardiac function could be causing her abdominal symptoms as well. I will sent her to cardiology for a complete work up to rule out any cardiac pathology.   Lipid Panel    Ambulatory referral to Cardiology       Meds ordered this encounter  Medications   pantoprazole (PROTONIX) 40 MG tablet    Sig: Take 1 tablet (40 mg total) by mouth daily.    Dispense:  30 tablet    Refill:  2    No follow-ups  on file.  Karie Georges, MD

## 2023-05-18 ENCOUNTER — Telehealth: Payer: Self-pay | Admitting: Family Medicine

## 2023-05-18 DIAGNOSIS — H353231 Exudative age-related macular degeneration, bilateral, with active choroidal neovascularization: Secondary | ICD-10-CM | POA: Diagnosis not present

## 2023-05-18 LAB — COMPREHENSIVE METABOLIC PANEL
ALT: 29 U/L (ref 0–35)
AST: 20 U/L (ref 0–37)
Albumin: 4.3 g/dL (ref 3.5–5.2)
Alkaline Phosphatase: 51 U/L (ref 39–117)
BUN: 28 mg/dL — ABNORMAL HIGH (ref 6–23)
CO2: 26 mEq/L (ref 19–32)
Calcium: 9.6 mg/dL (ref 8.4–10.5)
Chloride: 100 mEq/L (ref 96–112)
Creatinine, Ser: 1.29 mg/dL — ABNORMAL HIGH (ref 0.40–1.20)
GFR: 39.4 mL/min — ABNORMAL LOW (ref >60.00)
Glucose, Bld: 87 mg/dL (ref 70–99)
Potassium: 4.4 mEq/L (ref 3.5–5.1)
Sodium: 136 mEq/L (ref 135–145)
Total Bilirubin: 0.5 mg/dL (ref 0.2–1.2)
Total Protein: 7.9 g/dL (ref 6.0–8.3)

## 2023-05-18 LAB — LIPID PANEL
Cholesterol: 276 mg/dL — ABNORMAL HIGH (ref 0–200)
HDL: 52.9 mg/dL (ref >39.00)
NonHDL: 222.78
Total CHOL/HDL Ratio: 5
Triglycerides: 316 mg/dL — ABNORMAL HIGH (ref 0.0–149.0)
VLDL: 63.2 mg/dL — ABNORMAL HIGH (ref 0.0–40.0)

## 2023-05-18 LAB — LDL CHOLESTEROL, DIRECT: Direct LDL: 156 mg/dL

## 2023-05-18 LAB — LIPASE: Lipase: 45 U/L (ref 11.0–59.0)

## 2023-05-18 NOTE — Telephone Encounter (Signed)
Pt husband is calling and would like blood work results

## 2023-05-19 NOTE — Telephone Encounter (Signed)
Attempted to call the patient but unable to leave a message

## 2023-05-19 NOTE — Telephone Encounter (Signed)
Sent in results management

## 2023-05-19 NOTE — Telephone Encounter (Signed)
Husband is aware of lab results. Copy placed up front per husband's request.

## 2023-05-20 MED ORDER — ATORVASTATIN CALCIUM 20 MG PO TABS
20.0000 mg | ORAL_TABLET | Freq: Every day | ORAL | 1 refills | Status: DC
Start: 2023-05-20 — End: 2023-11-09

## 2023-05-20 NOTE — Progress Notes (Signed)
Called in atorvastatin for her, ok to close

## 2023-05-20 NOTE — Addendum Note (Signed)
Addended by: Karie Georges on: 05/20/2023 10:38 AM   Modules accepted: Orders

## 2023-05-27 DIAGNOSIS — N1831 Chronic kidney disease, stage 3a: Secondary | ICD-10-CM | POA: Diagnosis not present

## 2023-06-01 DIAGNOSIS — N1831 Chronic kidney disease, stage 3a: Secondary | ICD-10-CM | POA: Diagnosis not present

## 2023-06-01 DIAGNOSIS — N2581 Secondary hyperparathyroidism of renal origin: Secondary | ICD-10-CM | POA: Diagnosis not present

## 2023-06-01 DIAGNOSIS — K589 Irritable bowel syndrome without diarrhea: Secondary | ICD-10-CM | POA: Diagnosis not present

## 2023-06-01 DIAGNOSIS — D631 Anemia in chronic kidney disease: Secondary | ICD-10-CM | POA: Diagnosis not present

## 2023-06-13 ENCOUNTER — Ambulatory Visit
Admission: RE | Admit: 2023-06-13 | Discharge: 2023-06-13 | Disposition: A | Discharge status: Home / Self Care | Payer: Medicare HMO | Source: Ambulatory Visit | Attending: Family Medicine | Admitting: Family Medicine

## 2023-06-13 DIAGNOSIS — I700 Atherosclerosis of aorta: Secondary | ICD-10-CM | POA: Diagnosis not present

## 2023-06-13 DIAGNOSIS — N200 Calculus of kidney: Secondary | ICD-10-CM | POA: Diagnosis not present

## 2023-06-13 DIAGNOSIS — R1084 Generalized abdominal pain: Secondary | ICD-10-CM

## 2023-06-13 DIAGNOSIS — R110 Nausea: Secondary | ICD-10-CM

## 2023-06-14 ENCOUNTER — Telehealth: Payer: Self-pay | Admitting: Family Medicine

## 2023-06-14 DIAGNOSIS — H353231 Exudative age-related macular degeneration, bilateral, with active choroidal neovascularization: Secondary | ICD-10-CM | POA: Diagnosis not present

## 2023-06-14 DIAGNOSIS — H04123 Dry eye syndrome of bilateral lacrimal glands: Secondary | ICD-10-CM | POA: Diagnosis not present

## 2023-06-14 DIAGNOSIS — H401133 Primary open-angle glaucoma, bilateral, severe stage: Secondary | ICD-10-CM | POA: Diagnosis not present

## 2023-06-14 NOTE — Telephone Encounter (Signed)
Left a detailed message with the information below at the patient's husband's cell number.

## 2023-06-14 NOTE — Telephone Encounter (Signed)
Pt husband is calling and would like ct scan results

## 2023-06-14 NOTE — Telephone Encounter (Signed)
Please advise that the results have not been read yet by the radiologist and I will let him know as soon as I receive them.

## 2023-06-17 NOTE — Telephone Encounter (Signed)
Pt husband is calling checking on the status of ct

## 2023-06-17 NOTE — Telephone Encounter (Signed)
This also has not been read yet--- not sure what's going on with the radiology reports-- should we call over the [facility] to see what's going on?

## 2023-06-17 NOTE — Telephone Encounter (Signed)
Spoke with Diane at [facility] and she stated there is a 1 week turnaround time for the radiologists at this time.  I called the patient's husband and informed him of this.  PCP also informed.

## 2023-06-24 DIAGNOSIS — Z0100 Encounter for examination of eyes and vision without abnormal findings: Secondary | ICD-10-CM | POA: Diagnosis not present

## 2023-06-24 DIAGNOSIS — H353221 Exudative age-related macular degeneration, left eye, with active choroidal neovascularization: Secondary | ICD-10-CM | POA: Diagnosis not present

## 2023-07-06 DIAGNOSIS — R1310 Dysphagia, unspecified: Secondary | ICD-10-CM | POA: Diagnosis not present

## 2023-07-06 DIAGNOSIS — R151 Fecal smearing: Secondary | ICD-10-CM | POA: Diagnosis not present

## 2023-07-06 DIAGNOSIS — K599 Functional intestinal disorder, unspecified: Secondary | ICD-10-CM | POA: Diagnosis not present

## 2023-07-06 DIAGNOSIS — K8681 Exocrine pancreatic insufficiency: Secondary | ICD-10-CM | POA: Diagnosis not present

## 2023-07-06 DIAGNOSIS — K5901 Slow transit constipation: Secondary | ICD-10-CM | POA: Diagnosis not present

## 2023-07-06 DIAGNOSIS — K660 Peritoneal adhesions (postprocedural) (postinfection): Secondary | ICD-10-CM | POA: Diagnosis not present

## 2023-07-06 DIAGNOSIS — K219 Gastro-esophageal reflux disease without esophagitis: Secondary | ICD-10-CM | POA: Diagnosis not present

## 2023-07-06 DIAGNOSIS — R1012 Left upper quadrant pain: Secondary | ICD-10-CM | POA: Diagnosis not present

## 2023-07-06 DIAGNOSIS — R1011 Right upper quadrant pain: Secondary | ICD-10-CM | POA: Diagnosis not present

## 2023-07-13 DIAGNOSIS — H401133 Primary open-angle glaucoma, bilateral, severe stage: Secondary | ICD-10-CM | POA: Diagnosis not present

## 2023-07-13 DIAGNOSIS — H353231 Exudative age-related macular degeneration, bilateral, with active choroidal neovascularization: Secondary | ICD-10-CM | POA: Diagnosis not present

## 2023-07-13 DIAGNOSIS — H04123 Dry eye syndrome of bilateral lacrimal glands: Secondary | ICD-10-CM | POA: Diagnosis not present

## 2023-07-27 DIAGNOSIS — H353231 Exudative age-related macular degeneration, bilateral, with active choroidal neovascularization: Secondary | ICD-10-CM | POA: Diagnosis not present

## 2023-08-02 DIAGNOSIS — H353231 Exudative age-related macular degeneration, bilateral, with active choroidal neovascularization: Secondary | ICD-10-CM | POA: Diagnosis not present

## 2023-08-04 DIAGNOSIS — K319 Disease of stomach and duodenum, unspecified: Secondary | ICD-10-CM | POA: Diagnosis not present

## 2023-08-04 DIAGNOSIS — K219 Gastro-esophageal reflux disease without esophagitis: Secondary | ICD-10-CM | POA: Diagnosis not present

## 2023-08-04 DIAGNOSIS — R1310 Dysphagia, unspecified: Secondary | ICD-10-CM | POA: Diagnosis not present

## 2023-08-04 DIAGNOSIS — Z8711 Personal history of peptic ulcer disease: Secondary | ICD-10-CM | POA: Diagnosis not present

## 2023-08-04 DIAGNOSIS — K3189 Other diseases of stomach and duodenum: Secondary | ICD-10-CM | POA: Diagnosis not present

## 2023-08-31 ENCOUNTER — Ambulatory Visit: Payer: Medicare HMO | Attending: Cardiovascular Disease | Admitting: Cardiovascular Disease

## 2023-08-31 ENCOUNTER — Encounter: Payer: Self-pay | Admitting: Cardiovascular Disease

## 2023-08-31 VITALS — BP 118/74 | HR 63 | Ht 61.0 in | Wt 140.8 lb

## 2023-08-31 DIAGNOSIS — K222 Esophageal obstruction: Secondary | ICD-10-CM

## 2023-08-31 DIAGNOSIS — H3530 Unspecified macular degeneration: Secondary | ICD-10-CM

## 2023-08-31 DIAGNOSIS — K219 Gastro-esophageal reflux disease without esophagitis: Secondary | ICD-10-CM | POA: Diagnosis not present

## 2023-08-31 DIAGNOSIS — R0609 Other forms of dyspnea: Secondary | ICD-10-CM

## 2023-08-31 DIAGNOSIS — R0789 Other chest pain: Secondary | ICD-10-CM

## 2023-08-31 DIAGNOSIS — M940 Chondrocostal junction syndrome [Tietze]: Secondary | ICD-10-CM

## 2023-08-31 DIAGNOSIS — E782 Mixed hyperlipidemia: Secondary | ICD-10-CM | POA: Diagnosis not present

## 2023-08-31 MED ORDER — METOPROLOL TARTRATE 25 MG PO TABS: ORAL_TABLET | ORAL | 0 refills | Status: DC

## 2023-08-31 NOTE — Patient Instructions (Signed)
Medication Instructions:  TAKE OVER THE COUNTER IBUPROFEN FOR THE CHEST TIGHTNESS.   YOU MAY TAKE THIS MEDICATION 2-3 TIMES DAILY AS NEEDED FOR CHEST TIGHTNESS.  DO NOT TAKE THE IBUPROFEN ON AN EMPTY STOMACH.  *If you need a refill on your cardiac medications before your next appointment, please call your pharmacy*    Lab Work: RETURN IN 6 WEEKS FOR FASTING LABS ...........Marland KitchenCMET, CBC, LIPID, Lpa, TSH If you have labs (blood work) drawn today and your tests are completely normal, you will receive your results only by: MyChart Message (if you have MyChart) OR A paper copy in the mail   If you have any lab test that is abnormal or we need to change your treatment, we will call you to review the results.  Your physician has requested that you have an echocardiogram. Echocardiography is a painless test that uses sound waves to create images of your heart. It provides your doctor with information about the size and shape of your heart and how well your heart's chambers and valves are working. This procedure takes approximately one hour. There are no restrictions for this procedure. Please do NOT wear cologne, perfume, aftershave, or lotions (deodorant is allowed)  Please arrive 15 minutes prior to your appointment time.      Your cardiac CT will be scheduled at one of the below locations:   [facility] [address] [place], [place] [address] [phone]  OR  [facility] [address] [place], [place] [address] [phone]  OR   [facility] [address] [place], [place] [address] [phone]  If scheduled at [facility], please arrive at the [facility] and Children's Entrance (Entrance C2) of [facility] 30 minutes prior to test start time. You can use the FREE valet parking offered at entrance C (encouraged to control the heart rate for the test)  Proceed to the [facility] Radiology Department (first floor) to check-in and test prep.  All radiology patients and guests should use entrance C2 at [facility], accessed from [facility], even though the hospital's physical address listed is [address].    If scheduled at [facility] or [facility], please arrive 15 mins early for check-in and test prep.  There is spacious parking and easy access to the radiology department from the [facility] Heart and Vascular entrance. Please enter here and check-in with the desk attendant.   Please follow these instructions carefully (unless otherwise directed):  An IV will be required for this test and Nitroglycerin will be given.  Hold all erectile dysfunction medications at least 3 days (72 hrs) prior to test. (Ie viagra, cialis, sildenafil, tadalafil, etc)   On the Night Before the Test: Be sure to Drink plenty of water. Do not consume any caffeinated/decaffeinated beverages or chocolate 12 hours prior to your test. Do not take any antihistamines 12 hours prior to your test. If the patient has contrast allergy: Patient will need a prescription for Prednisone and very clear instructions (as follows): Prednisone 50 mg - take 13 hours prior to test Take another Prednisone 50 mg 7 hours prior to test Take another Prednisone 50 mg 1 hour prior to test Take Benadryl 50 mg 1 hour prior to test Patient must complete all four doses of above prophylactic medications. Patient will need a ride after test due to Benadryl.  On the Day of the Test:  Drink plenty of water until 1 hour prior to the test. Do not eat any food 1 hour prior to test. You may take your regular medications prior to the test.  Take metoprolol (Lopressor) two hours prior to test. If you take Furosemide/Hydrochlorothiazide/Spironolactone, please HOLD on the morning of the test. FEMALES- please wear underwire-free bra if available,  avoid dresses & tight clothing       After the Test: Drink plenty of water. After receiving IV contrast, you may experience a mild flushed feeling. This is normal. On occasion, you may experience a mild rash up to 24 hours after the test. This is not dangerous. If this occurs, you can take Benadryl 25 mg and increase your fluid intake. If you experience trouble breathing, this can be serious. If it is severe call 911 IMMEDIATELY. If it is mild, please call our office. If you take any of these medications: Glipizide/Metformin, Avandament, Glucavance, please do not take 48 hours after completing test unless otherwise instructed.  We will call to schedule your test 2-4 weeks out understanding that some insurance companies will need an authorization prior to the service being performed.   For more information and frequently asked questions, please visit our website : [email or web address]  For non-scheduling related questions, please contact the cardiac imaging nurse navigator should you have any questions/concerns: Cardiac Imaging Nurse Navigators Direct Office Dial: [phone]   For scheduling needs, including cancellations and rescheduling, please call [place], [phone].    Follow-Up: At [facility], you and your health needs are our priority.  As part of our continuing mission to provide you with exceptional heart care, we have created designated Provider Care Teams.  These Care Teams include your primary Cardiologist (physician) and Advanced Practice Providers (APPs -  Physician Assistants and Nurse Practitioners) who all work together to provide you with the care you need, when you need it.  We recommend signing up for the patient portal called "MyChart".  Sign up information is provided on this After Visit Summary.  MyChart is used to connect with patients for Virtual Visits (Telemedicine).  Patients are able to view lab/test results, encounter notes, upcoming  appointments, etc.  Non-urgent messages can be sent to your provider as well.   To learn more about what you can do with MyChart, go to [email or web address].    Your next appointment:   2 month(s)  Provider:   DR. Nicki Guadalajara

## 2023-08-31 NOTE — Progress Notes (Signed)
Cardiology Office Note    Date:  09/05/2023   ID:  Sibyle, Mohammadi 12/24/1940, MRN [id number]  PCP:  Karie Georges, MD  Cardiologist:  Nicki Guadalajara, MD   New Cardiology evaluation referred by Dr. Nira Conn for chest tightness   History of Present Illness:  Danielle Gaines is a 82 y.o. female who I had seen in March 2017 when she was referred by Dr. Kinnie Scales for cardiology evaluation prior to undergoing colonoscopy.  At the time, she had a longstanding history of irritable bowel syndrome and 20 years previously had undergone partial colectomy.  There is a questional history of possible remote CVA which was not revealed on CT and MRI imaging.  She had a history of major depressive disorder.  An echo Doppler study showed an EF of 55 to 60%.  In 2016 she had had an ER evaluation for atypical chest pain.  When I saw her, her ECG showed sinus rhythm at 65 bpm with QS complex in V1 and V2 unchanged from prior electrocardiograms.  She was given clearance to undergo her colonoscopy procedure.  I have not seen Danielle Gaines since her 2017 evaluation.  She now is being followed by Dr. Nira Conn at [facility].  For the past 6 months, she has experienced episodes of heaviness in her chest.  Often these are nonexertional.  At times she may take 2 or 3 Tums with benefit.  She does have a history of GERD.  Apparently she had undergone subsequent endoscopy with Dr. Jola Babinski and had undergone dilatation of esophageal stricture.  She admits to occasional shortness of breath and if she stands for long time she does experience some mild lightheadedness.  At times she experiences some occasional sharp chest pain which can come and go anytime throughout the day and is unassociated with difficulty swallowing.  She does have macular degeneration worse in her right eye and glaucoma.  Laboratory on May 17, 2023 showed a total cholesterol 276, triglycerides 316, VLDL 63, and direct LDL 156.  Creatinine was 1.29.   She was recently started on atorvastatin by Dr. Casimiro Needle for hyperlipidemia.  Her present medications now include atorvastatin 20 mg daily, furosemide 20 mg.  She takes Linzess for irritable bowel syndrome, Prolia for osteoporosis.  She is on q. Lipka by her urologist for kidney stones.  She takes [place] as needed for migraines.  She presents for evaluation.   Past Medical History:  Diagnosis Date   Anemia    Anxiety    Arthritis    Benzodiazepine dependence (HCC)    Carotid arterial disease (HCC)    mild   Chronic kidney disease (CKD), stage III (moderate) (HCC) 08/01/2018   -seeing  Nephrology, Dr. 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 Evelene Croon   Diastolic heart failure [facility])    mild on echo 2016 no cardiologist   GERD (gastroesophageal reflux disease)    IBS (irritable bowel syndrome)    sees Dr. Kinnie Scales   Macular degeneration    goes to [facility] for this   Migraine    managed by neurologist   Osteoporosis    Sty    right eye   Uvulitis    Vertigo    seeing  dr  bates for lov 08-11-2021 epic   Wears glasses    for reading   Wears hearing aid in both ears     Past Surgical History:  Procedure Laterality Date   ABDOMINAL ADHESION SURGERY     ABDOMINAL HYSTERECTOMY  1972   APPENDECTOMY     CHOLECYSTECTOMY  1996   colonscopy  1989   CYSTOSCOPY W/ URETERAL STENT PLACEMENT Right 04/27/2016   Procedure: CYSTOSCOPY WITH RETROGRADE PYELOGRAM/ RIGHT URETERAL STENT PLACEMENT;  Surgeon: Crist Fat, MD;  Location: [facility];  Service: Urology;  Laterality: Right;   CYSTOSCOPY WITH RETROGRADE PYELOGRAM, URETEROSCOPY AND STENT PLACEMENT Left 10/09/2021   Procedure: CYSTOSCOPY WITH LEFT RETROGRADE URETEROSCOPY WITH HOLMIUM LASER AND STENT PLACEMENT;  Surgeon: Bjorn Pippin, MD;  Location: [facility];  Service: Urology;  Laterality: Left;   ESOPHAGOGASTRODUODENOSCOPY     2002, 2011, 2014, 2021   EXTRACORPOREAL SHOCK WAVE LITHOTRIPSY Left 08/31/2021   Procedure: LEFT EXTRACORPOREAL SHOCK WAVE LITHOTRIPSY (ESWL);  Surgeon: Crist Fat, MD;  Location: [facility];  Service: Urology;  Laterality: Left;   FLEXIBLE SIGMOIDOSCOPY     1991, 1994, 2006, 2011, 2021   HOLMIUM LASER APPLICATION Left 10/09/2021   Procedure: HOLMIUM LASER APPLICATION;  Surgeon: Bjorn Pippin, MD;  Location: [facility];  Service: Urology;  Laterality: Left;   lasix eye surgery     ou   MEMBRANECTOMY Left 09/21/2013   OVARIAN CYST SURGERY     PARS PLANA VITRECTOMY Left 09/21/2013   SMALL INTESTINE SURGERY     SUBTOTAL COLECTOMY     1991, 1994, 2006, 2011   tumor removal right shoulder  06/22/2021    Current Medications: Outpatient Medications Prior to Visit  Medication Sig Dispense Refill   acetaminophen (TYLENOL) 500 MG tablet Take 1,000 mg by mouth daily as needed for headache.     Apoaequorin (PREVAGEN PO) Take by mouth daily.     atorvastatin (LIPITOR) 20 MG tablet Take 1 tablet (20 mg total) by mouth daily. 90 tablet 1   betamethasone valerate ointment (VALISONE) 0.1 % Use a pea sized amount 2 x a day for up to one week. Not for daily long term use. You can use a pea sized amount topically 1-2 x a week as needed. 30 g 1   brimonidine (ALPHAGAN P) 0.1 % SOLN Apply to eye.     citalopram (CELEXA) 20 MG tablet Take 20 mg by mouth daily.     cycloSPORINE (RESTASIS) 0.05 % ophthalmic emulsion Place 1 drop into both eyes 2 (two) times daily.     denosumab (PROLIA) 60 MG/ML SOSY injection Inject 60 mg into the skin every 6 (six) months.     dexamethasone (DECADRON) 0.1 % ophthalmic solution Place 1 drop into both eyes 4 (four) times daily.     dorzolamidel-timolol (COSOPT) 22.3-6.8 MG/ML SOLN ophthalmic solution 1 drop 2 (two) times daily.     doxycycline (VIBRAMYCIN) 50  MG capsule Take 50 mg by mouth every 12 (twelve) hours.     estradiol (ESTRACE) 0.1 MG/GM vaginal cream Place vaginally.     folic acid (FOLVITE) 1 MG tablet Take by mouth.     furosemide (LASIX) 20 MG tablet Take 20 mg by mouth daily.     LINZESS 290 MCG CAPS capsule Take 290 mcg by mouth daily before breakfast.   0   LORazepam (ATIVAN) 0.5 MG tablet Take 0.5 mg by mouth in the morning, at noon, and at bedtime. 1 in am  1 at midday 2 at hs     meclizine (ANTIVERT) 25 MG tablet TAKE 1 TABLET(25 MG) BY MOUTH THREE TIMES DAILY AS NEEDED FOR DIZZINESS 30 tablet 2   neomycin-polymyxin b-dexamethasone (MAXITROL) 3.5-10000-0.1 OINT      olopatadine (PATANOL) 0.1 % ophthalmic solution 1 drop 2 (two) times daily.     omeprazole (PRILOSEC) 40 MG capsule Take 40 mg by mouth daily.     pantoprazole (PROTONIX) 40 MG tablet Take 1 tablet (40 mg total) by mouth daily. 30 tablet 2   Probiotic Product (ALIGN PO) Take by mouth daily.     QULIPTA 60 MG TABS Take 1 tablet by mouth daily.     tamsulosin (FLOMAX) 0.4 MG CAPS capsule Take 0.4 mg by mouth at bedtime.     Travoprost, BAK Free, (TRAVATAN) 0.004 % SOLN ophthalmic solution 1 drop at bedtime.     tretinoin (RETIN-A) 0.025 % cream APPLY A PEA SIZED AMOUNT TO FACE ONCE DAILY IN THE EVENING     UBRELVY 100 MG TABS      Atogepant (QULIPTA) 60 MG TABS Take 60 mg by mouth daily. (Patient not taking: Reported on 08/31/2023)     ondansetron (ZOFRAN) 4 MG tablet Take 1 tablet (4 mg total) by mouth every 8 (eight) hours as needed for nausea or vomiting. (Patient not taking: Reported on 08/31/2023) 30 tablet 1   ZENPEP 15000 units CPEP Take 45,000 mg by mouth 2 (two) times daily. (Patient not taking: Reported on 08/31/2023)  0   No facility-administered medications prior to visit.     Allergies:   Elemental sulfur, Latanoprost, and Sulfa antibiotics   Social History   Socioeconomic History   Marital status: Married    Spouse name: Not on file   Number of  children: Not on file   Years of education: Not on file   Highest education level: Not on file  Occupational History   Not on file  Tobacco Use   Smoking status: Never   Smokeless tobacco: Never  Vaping Use   Vaping status: Never Used  Substance and Sexual Activity   Alcohol use: No   Drug use: No   Sexual activity: Not Currently    Birth control/protection: Post-menopausal  Other Topics Concern   Not on file  Social History Narrative   Work or School: none      Home Situation: lives with husband      Spiritual Beliefs:       Lifestyle: no regular exercise; diet is ok         Social Determinants of Corporate investment banker Strain: Not on file  Food Insecurity: No Food Insecurity (06/02/2022)   Received from [facility], [facility]   Hunger Vital Sign    Worried About Running Out of Food in the Last Year: Never true    Ran Out of Food in the Last Year: Never true  Transportation Needs: Not on file  Physical Activity: Not on file  Stress: Not on file  Social Connections: Unknown (04/11/2022)   Received from [facility], [facility]   Social Network    Social Network: Not on file    Social history is notable that she was born in [place].  She is married for 34 years, has 1 child, 6 grandchildren and 4 great-grandchildren.  She previously had worked as a Scientist, physiological for [employer].  She completed 12 grade of education.  There is no tobacco or alcohol use.  She does  not routinely exercise.  Family History:  The patient's family history includes Cancer in her maternal aunt; Depression in an other family member; Heart Problems in her sister; Heart disease in her maternal grandmother and mother; Kidney disease in her mother; Osteoporosis in her mother; Stroke in her maternal grandmother and another family member.  Her mother died at age 79 with heart failure.  She is not aware of her father.  She has a sister age 69 who had a pacemaker.  Her 1 child is 66  years old.  ROS General: Negative; No fevers, chills, or night sweats;  HEENT: Glaucoma, macular degeneration worse in right eye; No changes in hearing, sinus congestion, difficulty swallowing Pulmonary: Negative; No cough, wheezing, shortness of breath, hemoptysis Cardiovascular: See HPI GI: History of esophageal dilatation; GERD GU: Negative; No dysuria, hematuria, or difficulty voiding Musculoskeletal: Negative; no myalgias, joint pain, or weakness Hematologic/Oncology: Negative; no easy bruising, bleeding Endocrine: Negative; no heat/cold intolerance; no diabetes Neuro: Negative; no changes in balance, headaches Skin: Negative; No rashes or skin lesions Psychiatric: Negative; No behavioral problems, depression Sleep: Negative; No snoring, daytime sleepiness, hypersomnolence, bruxism, restless legs, hypnogognic hallucinations, no cataplexy Other comprehensive 14 point system review is negative.   PHYSICAL EXAM:   VS:  BP 118/74   Pulse 63   Ht 5\' 1"  (1.549 m)   Wt 140 lb 12.8 oz (63.9 kg)   SpO2 96%   BMI 26.60 kg/m     Repeat blood pressure by me was 124/72  Wt Readings from Last 3 Encounters:  08/31/23 140 lb 12.8 oz (63.9 kg)  05/17/23 141 lb 14.4 oz (64.4 kg)  04/12/23 137 lb (62.1 kg)    General: Alert, oriented, no distress.  Skin: normal turgor, no rashes, warm and dry HEENT: Normocephalic, atraumatic. Pupils equal round and reactive to light; sclera anicteric; extraocular muscles intact; Nose without nasal septal hypertrophy Mouth/Parynx benign; Mallinpatti scale 3 Neck: No JVD, no carotid bruits; normal carotid upstroke Lungs: clear to ausculatation and percussion; no wheezing or rales Chest wall: Bilateral costochondral tenderness  Heart: PMI not displaced, RRR, s1 s2 normal, 1/6 systolic murmur, no diastolic murmur, no rubs, gallops, thrills, or heaves Abdomen: soft, nontender; no hepatosplenomehaly, BS+; abdominal aorta nontender and not dilated by  palpation. Back: no CVA tenderness Pulses 2+ Musculoskeletal: full range of motion, normal strength, no joint deformities Extremities: no clubbing cyanosis or edema, Homan's sign negative  Neurologic: grossly nonfocal; Cranial nerves grossly wnl Psychologic: Normal mood and affect   Studies/Labs Reviewed:   EKG Interpretation Date/Time:  Wednesday August 31 2023 13:54:07 EDT Ventricular Rate:  63 PR Interval:  198 QRS Duration:  80 QT Interval:  432 QTC Calculation: 442 R Axis:   -46  Text Interpretation: Normal sinus rhythm Left axis deviation Septal infarct , age undetermined When compared with ECG of 29-Jun-2018 17:19, PREVIOUS ECG IS PRESENT Confirmed by Nicki Guadalajara ([id number]) on 08/31/2023 2:15:56 PM    Recent Labs:    Latest Ref Rng & Units 05/17/2023    3:42 PM 04/27/2022    1:06 PM 04/06/2022    4:03 PM  BMP  Glucose 70 - 99 mg/dL 87  99  604   BUN 6 - 23 mg/dL 28  23  42   Creatinine 0.40 - 1.20 mg/dL 5.40  9.81  1.91   Sodium 135 - 145 mEq/L 136  138  134   Potassium 3.5 - 5.1 mEq/L 4.4  3.6  3.4   Chloride 96 - 112 mEq/L  100  112  105   CO2 19 - 32 mEq/L 26  17  15    Calcium 8.4 - 10.5 mg/dL 9.6  9.6  16.1         Latest Ref Rng & Units 05/17/2023    3:42 PM 09/03/2020    1:16 PM 01/21/2020    8:56 PM  Hepatic Function  Total Protein 6.0 - 8.3 g/dL 7.9  8.1  7.9   Albumin 3.5 - 5.2 g/dL 4.3  4.9  4.9   AST 0 - 37 U/L 20  16  20    ALT 0 - 35 U/L 29  14  22    Alk Phosphatase 39 - 117 U/L 51  57  53   Total Bilirubin 0.2 - 1.2 mg/dL 0.5  <0.9  0.3        Latest Ref Rng & Units 04/06/2022    4:03 PM 10/09/2021    3:44 PM 10/09/2021   10:38 AM  CBC  WBC 4.0 - 10.5 K/uL 7.7     Hemoglobin 12.0 - 15.0 g/dL 60.4  54.0  98.1   Hematocrit 36.0 - 46.0 % 44.2  44.0  47.0   Platelets 150.0 - 400.0 K/uL 236.0      Lab Results  Component Value Date   MCV 104.6 (H) 04/06/2022   MCV 101 (H) 09/03/2020   MCV 100 (H) 01/21/2020   Lab Results  Component Value Date    TSH 1.83 04/06/2022   Lab Results  Component Value Date   HGBA1C 5.3 08/18/2017     BNP No results found for: "BNP"  ProBNP    Component Value Date/Time   PROBNP <30.0 12/29/2010 1233     Lipid Panel     Component Value Date/Time   CHOL 276 (H) 05/17/2023 1542   TRIG 316.0 (H) 05/17/2023 1542   HDL 52.90 05/17/2023 1542   CHOLHDL 5 05/17/2023 1542   VLDL 63.2 (H) 05/17/2023 1542   LDLCALC 73 08/18/2017 1209   LDLDIRECT 156.0 05/17/2023 1542     RADIOLOGY: No results found.   Additional studies/ records that were reviewed today include:  Previous evaluation in 2017 by me and subsequent evaluations with additional providers were reviewed.  ASSESSMENT:    1. Dyspnea on exertion   2. Chest tightness   3. Costochondritis   4. Mixed hyperlipidemia   5. Esophageal stricture   6. Gastroesophageal reflux disease, unspecified whether esophagitis present   7. Macular degeneration, unspecified laterality, unspecified type     PLAN:  Danielle Gaines is an 82 year old female who has a history of irritable bowel syndrome status post remote partial colectomy.  A remote echo Doppler study had shown normal LV function with grade 1 diastolic dysfunction.  Recently, she has had issues with chest heaviness for the past 6 months.  Her chest pain is nonexertional.  She does have chest wall tenderness to palpation raising concern for probable a component of costochondritis.  She also has GERD and admits to some relief when she takes Tums.  She had undergone recent endoscopy by Dr. Jola Babinski following Dr. Jennye Boroughs retirement and underwent dilatation of esophageal stricture.  She admits to shortness of breath with activity and also can experience sharp nonexertional chest pain.  She has visual issues from glaucoma and macular generation worse in her right compared to left eye.  Recent laboratory has shown significant mixed hyperlipidemia with total cholesterol 276, triglycerides 316,  VLDL 63 and direct LDL 156.  She was recently  started on atorvastatin at 20 mg over the past 2 months by Dr. [place].  She may ultimately require omega-3 fatty acid due to her significant triglyceride elevation.  I suspect most of her chest symptomatology is noncardiac.  I am scheduling her for a 2D echo Doppler study for reassessment of LV systolic and diastolic function, wall thickness, and valvular architecture.  With her marked hyperlipidemia on scheduling her for coronary CTA assessment.  I have recommended a trial of low-dose nonsteroidal anti-inflammatory medicine to see if this can improve some of her chest wall tenderness to palpation.  In 6 weeks, scheduling her for follow-up laboratory with a comprehensive metabolic panel, CBC, TSH, fasting lipid studies, and we will also check an LP(a).  I will see her back in follow-up of the above studies and further recommendations will be made at that time.   Medication Adjustments/Labs and Tests Ordered: Current medicines are reviewed at length with the patient today.  Concerns regarding medicines are outlined above.  Medication changes, Labs and Tests ordered today are listed in the Patient Instructions below. Patient Instructions  Medication Instructions:  TAKE OVER THE COUNTER IBUPROFEN FOR THE CHEST TIGHTNESS.   YOU MAY TAKE THIS MEDICATION 2-3 TIMES DAILY AS NEEDED FOR CHEST TIGHTNESS.  DO NOT TAKE THE IBUPROFEN ON AN EMPTY STOMACH.  *If you need a refill on your cardiac medications before your next appointment, please call your pharmacy*    Lab Work: RETURN IN 6 WEEKS FOR FASTING LABS ...........Marland KitchenCMET, CBC, LIPID, Lpa, TSH If you have labs (blood work) drawn today and your tests are completely normal, you will receive your results only by: MyChart Message (if you have MyChart) OR A paper copy in the mail   If you have any lab test that is abnormal or we need to change your treatment, we will call you to review the results.  Your  physician has requested that you have an echocardiogram. Echocardiography is a painless test that uses sound waves to create images of your heart. It provides your doctor with information about the size and shape of your heart and how well your heart's chambers and valves are working. This procedure takes approximately one hour. There are no restrictions for this procedure. Please do NOT wear cologne, perfume, aftershave, or lotions (deodorant is allowed)  Please arrive 15 minutes prior to your appointment time.      Your cardiac CT will be scheduled at one of the below locations:   [facility] [address] [place], [place] [address] [phone]  OR  [facility] [address] [place], [place] [address] [phone]  OR   [facility] [address] [place], [place] [address] [phone]  If scheduled at [facility], please arrive at the [facility] and Children's Entrance (Entrance C2) of [facility] 30 minutes prior to test start time. You can use the FREE valet parking offered at entrance C (encouraged to control the heart rate for the test)  Proceed to the [facility] Radiology Department (first floor) to check-in and test prep.  All radiology patients and guests should use entrance C2 at [facility], accessed from [facility], even though the hospital's physical address listed is [address]    If scheduled at [facility] or [facility], please arrive 15 mins early for check-in and test prep.  There is spacious parking and easy access to the  radiology department from the [facility] entrance. Please enter here and check-in with the desk attendant.   Please follow these instructions carefully (unless otherwise directed):  An IV will be required for this test and Nitroglycerin will be  given.  Hold all erectile dysfunction medications at least 3 days (72 hrs) prior to test. (Ie viagra, cialis, sildenafil, tadalafil, etc)   On the Night Before the Test: Be sure to Drink plenty of water. Do not consume any caffeinated/decaffeinated beverages or chocolate 12 hours prior to your test. Do not take any antihistamines 12 hours prior to your test. If the patient has contrast allergy: Patient will need a prescription for Prednisone and very clear instructions (as follows): Prednisone 50 mg - take 13 hours prior to test Take another Prednisone 50 mg 7 hours prior to test Take another Prednisone 50 mg 1 hour prior to test Take Benadryl 50 mg 1 hour prior to test Patient must complete all four doses of above prophylactic medications. Patient will need a ride after test due to Benadryl.  On the Day of the Test: Drink plenty of water until 1 hour prior to the test. Do not eat any food 1 hour prior to test. You may take your regular medications prior to the test.  Take metoprolol (Lopressor) two hours prior to test. If you take Furosemide/Hydrochlorothiazide/Spironolactone, please HOLD on the morning of the test. FEMALES- please wear underwire-free bra if available, avoid dresses & tight clothing       After the Test: Drink plenty of water. After receiving IV contrast, you may experience a mild flushed feeling. This is normal. On occasion, you may experience a mild rash up to 24 hours after the test. This is not dangerous. If this occurs, you can take Benadryl 25 mg and increase your fluid intake. If you experience trouble breathing, this can be serious. If it is severe call 911 IMMEDIATELY. If it is mild, please call our office. If you take any of these medications: Glipizide/Metformin, Avandament, Glucavance, please do not take 48 hours after completing test unless otherwise instructed.  We will call to schedule your test 2-4 weeks out understanding that some insurance companies  will need an authorization prior to the service being performed.   For more information and frequently asked questions, please visit our website : [email or web address]  For non-scheduling related questions, please contact the cardiac imaging nurse navigator should you have any questions/concerns: Cardiac Imaging Nurse Navigators Direct Office Dial: [phone]   For scheduling needs, including cancellations and rescheduling, please call [place], [phone].    Follow-Up: At [facility], you and your health needs are our priority.  As part of our continuing mission to provide you with exceptional heart care, we have created designated Provider Care Teams.  These Care Teams include your primary Cardiologist (physician) and Advanced Practice Providers (APPs -  Physician Assistants and Nurse Practitioners) who all work together to provide you with the care you need, when you need it.  We recommend signing up for the patient portal called "MyChart".  Sign up information is provided on this After Visit Summary.  MyChart is used to connect with patients for Virtual Visits (Telemedicine).  Patients are able to view lab/test results, encounter notes, upcoming appointments, etc.  Non-urgent messages can be sent to your provider as well.   To learn more about what you can do with MyChart, go to [email or web address].    Your next appointment:   2 month(s)  Provider:  DR. Nicki Guadalajara        Signed, Nicki Guadalajara, MD  09/05/2023 12:22 PM    [facility] [address], [address], [place], [place]  [address] Phone: [phone]

## 2023-09-05 ENCOUNTER — Encounter: Payer: Self-pay | Admitting: Cardiovascular Disease

## 2023-09-07 DIAGNOSIS — H353231 Exudative age-related macular degeneration, bilateral, with active choroidal neovascularization: Secondary | ICD-10-CM | POA: Diagnosis not present

## 2023-09-22 ENCOUNTER — Ambulatory Visit (HOSPITAL_COMMUNITY): Payer: Medicare HMO | Attending: Cardiology

## 2023-09-22 DIAGNOSIS — R0789 Other chest pain: Secondary | ICD-10-CM | POA: Diagnosis not present

## 2023-09-22 LAB — ECHOCARDIOGRAM COMPLETE
Area-P 1/2: 2.74 cm2
S' Lateral: 2.5 cm

## 2023-09-27 NOTE — Telephone Encounter (Signed)
Prolia VOB initiated via [email or web address]  Next Prolia inj DUE: 10/14/23

## 2023-09-28 DIAGNOSIS — H401133 Primary open-angle glaucoma, bilateral, severe stage: Secondary | ICD-10-CM | POA: Diagnosis not present

## 2023-09-28 DIAGNOSIS — H04123 Dry eye syndrome of bilateral lacrimal glands: Secondary | ICD-10-CM | POA: Diagnosis not present

## 2023-09-28 DIAGNOSIS — H353231 Exudative age-related macular degeneration, bilateral, with active choroidal neovascularization: Secondary | ICD-10-CM | POA: Diagnosis not present

## 2023-10-05 ENCOUNTER — Emergency Department (HOSPITAL_COMMUNITY): Payer: Medicare HMO

## 2023-10-05 ENCOUNTER — Emergency Department (HOSPITAL_COMMUNITY)
Admission: EM | Admit: 2023-10-05 | Discharge: 2023-10-05 | Disposition: A | Discharge status: Home / Self Care | Payer: Medicare HMO | Attending: Student | Admitting: Student

## 2023-10-05 ENCOUNTER — Other Ambulatory Visit: Payer: Self-pay

## 2023-10-05 ENCOUNTER — Encounter (HOSPITAL_COMMUNITY): Payer: Self-pay | Admitting: Pharmacy Technician

## 2023-10-05 DIAGNOSIS — K449 Diaphragmatic hernia without obstruction or gangrene: Secondary | ICD-10-CM | POA: Diagnosis not present

## 2023-10-05 DIAGNOSIS — R140 Abdominal distension (gaseous): Secondary | ICD-10-CM

## 2023-10-05 DIAGNOSIS — N189 Chronic kidney disease, unspecified: Secondary | ICD-10-CM | POA: Diagnosis not present

## 2023-10-05 DIAGNOSIS — I5030 Unspecified diastolic (congestive) heart failure: Secondary | ICD-10-CM | POA: Diagnosis not present

## 2023-10-05 DIAGNOSIS — I2510 Atherosclerotic heart disease of native coronary artery without angina pectoris: Secondary | ICD-10-CM | POA: Insufficient documentation

## 2023-10-05 DIAGNOSIS — R0602 Shortness of breath: Secondary | ICD-10-CM | POA: Diagnosis not present

## 2023-10-05 DIAGNOSIS — N200 Calculus of kidney: Secondary | ICD-10-CM | POA: Diagnosis not present

## 2023-10-05 LAB — BASIC METABOLIC PANEL
Anion gap: 9 (ref 5–15)
BUN: 9 mg/dL (ref 8–23)
CO2: 29 mmol/L (ref 22–32)
Calcium: 9.8 mg/dL (ref 8.9–10.3)
Chloride: 100 mmol/L (ref 98–111)
Creatinine, Ser: 1.18 mg/dL — ABNORMAL HIGH (ref 0.44–1.00)
GFR, Estimated: 47 mL/min — ABNORMAL LOW (ref >60)
Glucose, Bld: 110 mg/dL — ABNORMAL HIGH (ref 70–99)
Potassium: 4.3 mmol/L (ref 3.5–5.1)
Sodium: 138 mmol/L (ref 135–145)

## 2023-10-05 LAB — CBC
HCT: 37.3 % (ref 36.0–46.0)
Hemoglobin: 12.1 g/dL (ref 12.0–15.0)
MCH: 32.6 pg (ref 26.0–34.0)
MCHC: 32.4 g/dL (ref 30.0–36.0)
MCV: 100.5 fL — ABNORMAL HIGH (ref 80.0–100.0)
Platelets: 206 10*3/uL (ref 150–400)
RBC: 3.71 MIL/uL — ABNORMAL LOW (ref 3.87–5.11)
RDW: 12.7 % (ref 11.5–15.5)
WBC: 5.2 10*3/uL (ref 4.0–10.5)
nRBC: 0 % (ref 0.0–0.2)

## 2023-10-05 LAB — BRAIN NATRIURETIC PEPTIDE: B Natriuretic Peptide: 139.8 pg/mL — ABNORMAL HIGH (ref 0.0–100.0)

## 2023-10-05 LAB — TROPONIN I (HIGH SENSITIVITY): Troponin I (High Sensitivity): 6 ng/L (ref <18)

## 2023-10-05 MED ORDER — IOHEXOL 350 MG/ML SOLN
60.0000 mL | Freq: Once | INTRAVENOUS | Status: AC | PRN
  Administered 2023-10-05: 60 mL via INTRAVENOUS

## 2023-10-05 MED ORDER — MORPHINE SULFATE (PF) 4 MG/ML IV SOLN
4.0000 mg | Freq: Once | INTRAVENOUS | Status: AC
  Administered 2023-10-05: 4 mg via INTRAVENOUS
  Filled 2023-10-05: qty 1

## 2023-10-05 MED ORDER — FUROSEMIDE 10 MG/ML IJ SOLN
40.0000 mg | Freq: Once | INTRAMUSCULAR | Status: AC
  Administered 2023-10-05: 40 mg via INTRAVENOUS
  Filled 2023-10-05: qty 4

## 2023-10-05 NOTE — ED Notes (Signed)
Patient transported to CT 

## 2023-10-05 NOTE — Discharge Instructions (Addendum)
Take 40 mg lasix tomorrow and the next day  8.3 ox container or Miralax in 64 ox of Liquid of choice. One cup every 2 hours while awake until Bowel movement  Call tomorrow your Cardiologist  Return for new or worsening symptoms

## 2023-10-05 NOTE — ED Triage Notes (Signed)
Pt here POV with reports of bil lower extremity edema for the last several days that has progressively worsened. Pt now with swelling to her abdomen. Pt endorses shob. Today MD told pt to increase lasix to 40mg  from 20mg .

## 2023-10-05 NOTE — ED Provider Notes (Signed)
EMERGENCY DEPARTMENT AT [facility] Provider Note   CSN: [id number] Arrival date & time: 10/05/23  1121     History  Chief Complaint  Patient presents with   Edema    Danielle Gaines is a 82 y.o. female with a past medical history significant for IBS, depression, CAD, CKD, diastolic heart failure, and chronic pain who presents to the ED due to lower extremity edema for the past few days.  Patient notes she feels "congested" from the face all the way down to her feet.  She admits to acute on chronic shortness of breath.  Notes she has been dealing with shortness of breath for the past few months however, worsened over the past few weeks.  Shortness of breath occurs all the time however, worse when lying flat and with ambulation. Sleeps with 1 pillow at night; however notes she sits up multiple times throughout the night to help with breathing. No cough or fever.  Patient also admits to some abdominal distention that has worsened over the past few days.  Patient was advised by her nephrologist to increase her Lasix from 20 mg to 40 mg which she started today.  Also endorses some chest pain.  History obtained from patient and past medical records. No interpreter used during encounter.       Home Medications Prior to Admission medications   Medication Sig Start Date End Date Taking? Authorizing Provider  acetaminophen (TYLENOL) 500 MG tablet Take 1,000 mg by mouth daily as needed for headache.    [provider]  Apoaequorin (PREVAGEN PO) Take by mouth daily.    [provider]  Atogepant (QULIPTA) 60 MG TABS Take 60 mg by mouth daily. Patient not taking: Reported on 08/31/2023 06/02/22   [provider]  atorvastatin (LIPITOR) 20 MG tablet Take 1 tablet (20 mg total) by mouth daily. 05/20/23   Karie Georges, MD  betamethasone valerate ointment (VALISONE) 0.1 % Use a pea sized amount 2 x a day for up to one week. Not for daily long term use.  You can use a pea sized amount topically 1-2 x a week as needed. 10/28/22   Romualdo Bolk, MD  brimonidine (ALPHAGAN P) 0.1 % SOLN Apply to eye. 06/18/21   [provider]  citalopram (CELEXA) 20 MG tablet Take 20 mg by mouth daily. 03/25/23   [provider]  cycloSPORINE (RESTASIS) 0.05 % ophthalmic emulsion Place 1 drop into both eyes 2 (two) times daily. 10/09/19   [provider]  denosumab (PROLIA) 60 MG/ML SOSY injection Inject 60 mg into the skin every 6 (six) months.    [provider]  dexamethasone (DECADRON) 0.1 % ophthalmic solution Place 1 drop into both eyes 4 (four) times daily. 08/03/23   [provider]  dorzolamidel-timolol (COSOPT) 22.3-6.8 MG/ML SOLN ophthalmic solution 1 drop 2 (two) times daily. 07/29/22   [provider]  doxycycline (VIBRAMYCIN) 50 MG capsule Take 50 mg by mouth every 12 (twelve) hours. 08/02/23   [provider]  estradiol (ESTRACE) 0.1 MG/GM vaginal cream Place vaginally. 06/17/22   [provider]  folic acid (FOLVITE) 1 MG tablet Take by mouth. 12/21/19   [provider]  furosemide (LASIX) 20 MG tablet Take 20 mg by mouth daily. 08/13/22   [provider]  LINZESS 290 MCG CAPS capsule Take 290 mcg by mouth daily before breakfast.     [provider]  LORazepam (ATIVAN) 0.5 MG tablet Take 0.5  mg by mouth in the morning, at noon, and at bedtime. 1 in am 1 at midday 2 at hs    [provider]  meclizine (ANTIVERT) 25 MG tablet TAKE 1 TABLET(25 MG) BY MOUTH THREE TIMES DAILY AS NEEDED FOR DIZZINESS 03/08/23   Karie Georges, MD  metoprolol tartrate (LOPRESSOR) 25 MG tablet TAKE TABLET 2 HOURS BEFORE CARDIAC PROCEDURE 08/31/23   Lennette Bihari, MD  neomycin-polymyxin b-dexamethasone (MAXITROL) 3.5-10000-0.1 OINT  10/31/22   [provider]  olopatadine (PATANOL) 0.1 % ophthalmic solution 1 drop 2 (two) times daily.    [provider]   omeprazole (PRILOSEC) 40 MG capsule Take 40 mg by mouth daily. 08/11/23 08/10/24  [provider]  ondansetron (ZOFRAN) 4 MG tablet Take 1 tablet (4 mg total) by mouth every 8 (eight) hours as needed for nausea or vomiting. Patient not taking: Reported on 08/31/2023 05/17/23   Karie Georges, MD  pantoprazole (PROTONIX) 40 MG tablet Take 1 tablet (40 mg total) by mouth daily. 05/17/23   Karie Georges, MD  Probiotic Product (ALIGN PO) Take by mouth daily.    [provider]  QULIPTA 60 MG TABS Take 1 tablet by mouth daily. 08/03/22   [provider]  tamsulosin (FLOMAX) 0.4 MG CAPS capsule Take 0.4 mg by mouth at bedtime. 06/22/22   [provider]  Travoprost, BAK Free, (TRAVATAN) 0.004 % SOLN ophthalmic solution 1 drop at bedtime. 08/17/21   [provider]  tretinoin (RETIN-A) 0.025 % cream APPLY A PEA SIZED AMOUNT TO FACE ONCE DAILY IN THE EVENING 10/05/19   [provider]  UBRELVY 100 MG TABS  12/14/19   [provider]  ZENPEP 15000 units CPEP Take 45,000 mg by mouth 2 (two) times daily. Patient not taking: Reported on 08/31/2023    [provider]      Allergies    Elemental sulfur, Latanoprost, and Sulfa antibiotics    Review of Systems   Review of Systems  Constitutional:  Negative for chills and fever.  Respiratory:  Positive for shortness of breath. Negative for cough.   Cardiovascular:  Positive for chest pain and leg swelling.  Gastrointestinal:  Positive for abdominal distention.    Physical Exam Updated Vital Signs BP (!) 162/81   Pulse 87   Temp 98.3 F (36.8 C)   Resp 18   SpO2 96%  Physical Exam Vitals and nursing note reviewed.  Constitutional:      General: She is not in acute distress.    Appearance: She is not ill-appearing.  HENT:     Head: Normocephalic.  Eyes:     Pupils: Pupils are equal, round, and reactive to light.  Cardiovascular:     Rate and Rhythm: Normal rate and regular  rhythm.     Pulses: Normal pulses.     Heart sounds: Normal heart sounds. No murmur heard.    No friction rub. No gallop.  Pulmonary:     Effort: Pulmonary effort is normal.     Breath sounds: Normal breath sounds.  Abdominal:     General: Abdomen is flat. There is distension.     Palpations: Abdomen is soft.     Tenderness: There is no abdominal tenderness. There is no guarding or rebound.     Comments: Some mild abdominal distention.   Musculoskeletal:        General: Normal range of motion.     Cervical back: Neck supple.     Comments: Edema  around bilateral ankles  Skin:    General: Skin is warm and dry.  Neurological:     General: No focal deficit present.     Mental Status: She is alert.  Psychiatric:        Mood and Affect: Mood normal.        Behavior: Behavior normal.     ED Results / Procedures / Treatments   Labs (all labs ordered are listed, but only abnormal results are displayed) Labs Reviewed  BASIC METABOLIC PANEL - Abnormal; Notable for the following components:      Result Value   Glucose, Bld 110 (*)    Creatinine, Ser 1.18 (*)    GFR, Estimated 47 (*)    All other components within normal limits  CBC - Abnormal; Notable for the following components:   RBC 3.71 (*)    MCV 100.5 (*)    All other components within normal limits  BRAIN NATRIURETIC PEPTIDE - Abnormal; Notable for the following components:   B Natriuretic Peptide 139.8 (*)    All other components within normal limits  TROPONIN I (HIGH SENSITIVITY)  TROPONIN I (HIGH SENSITIVITY)    EKG EKG Interpretation Date/Time:  Wednesday October 05 2023 11:08:09 EST Ventricular Rate:  78 PR Interval:  198 QRS Duration:  94 QT Interval:  416 QTC Calculation: 474 R Axis:   -60  Text Interpretation: Normal sinus rhythm Abnormal ECG When compared with ECG of 31-Aug-2023 13:54, PREVIOUS ECG IS PRESENT Confirmed by Kommor, Madison ([id number]) on 10/05/2023 1:31:37 PM  Radiology DG Chest 2  View  Result Date: 10/05/2023 CLINICAL DATA:  Shortness of breath. EXAM: CHEST - 2 VIEW COMPARISON:  Apr 06, 2022. FINDINGS: The heart size and mediastinal contours are within normal limits. No acute pulmonary disease is noted. The visualized skeletal structures are unremarkable. IMPRESSION: No active cardiopulmonary disease. Electronically Signed   By: Lupita Raider M.D.   On: 10/05/2023 13:16    Procedures Procedures    Medications Ordered in ED Medications  morphine (PF) 4 MG/ML injection 4 mg (has no administration in time range)  furosemide (LASIX) injection 40 mg (has no administration in time range)    ED Course/ Medical Decision Making/ A&P Clinical Course as of 10/05/23 1531  Wed Oct 05, 2023  1506 Acute on chronic LE edema. Constipation. Took 40 mg Lasix today. Getting IV here Lasix. Ambulatory wo hypoxia. Can dc home likely [BH]    Clinical Course User Index [BH] Henderly, Britni A, PA-C                                 Medical Decision Making Amount and/or Complexity of Data Reviewed Independent Historian: spouse External Data Reviewed: notes.    Details: Recent cardiology note Labs: ordered. Decision-making details documented in ED Course. Radiology: ordered and independent interpretation performed. Decision-making details documented in ED Course. ECG/medicine tests: ordered and independent interpretation performed. Decision-making details documented in ED Course.  Risk Prescription drug management.   This patient presents to the ED for concern of lower extremity edema, this involves an extensive number of treatment options, and is a complaint that carries with it a high risk of complications and morbidity.  The differential diagnosis includes CHF, DVT, electrolyte abnormalities, etc  82 year old female presents to the ED due to lower extremity edema that has worsened over the past few days associated with abdominal distention and acute on chronic shortness of  breath.  Also endorses some chest pain.  Started taking 40 mg of Lasix today from her original 20 mg.  History of diastolic CHF.  No fever or chills.  No cough.  Upon arrival patient afebrile, not tachycardic or hypoxic.  Patient in no acute distress.  Possible abdominal distention on exam.  Some edema around bilateral ankles.  Routine labs ordered in triage.  Added BNP to rule out CHF exacerbation.  Troponin to rule out ACS given some chest pain. CT abdomen ordered due to abdominal distention.   CBC reassuring.  No leukocytosis.  Normal hemoglobin.  Troponin normal.  BNP mildly elevated 139.  IV Lasix given.  BMP reassuring.  Mild elevation creatinine 1.18.  Chest x-ray personally reviewed and interpreted which is negative for any acute abnormalities.  No evidence of pneumonia or pulmonary edema.  Agree with radiology interpretation.   Patient handed off to [facility], PA-C at shift change pending CT abdomen.   Co morbidities that complicate the patient evaluation  IBS, CHF Cardiac Monitoring: / EKG:  The patient was maintained on a cardiac monitor.  I personally viewed and interpreted the cardiac monitored which showed an underlying rhythm of: NSR  Social Determinants of Health:  Elderly age  Test / Admission - Considered:  CTA chest; however PE felt to be less likely  Discussed with Dr. Posey Rea who evaluated patient at bedside and agrees with assessment and plan.        Final Clinical Impression(s) / ED Diagnoses Final diagnoses:  Shortness of breath  Abdominal distention    Rx / DC Orders ED Discharge Orders     None         Jesusita Oka 10/05/23 1532    Glendora Score, MD 10/06/23 1206

## 2023-10-05 NOTE — ED Provider Notes (Signed)
Care assumed from previous provider.  See note for full HPI  In summation 82 year old history of CHF here for increased in Edema. Has some chronic edema and SOB. No hypoxia, ambulatory here. Feels edematous abdomen however no fluid wave. Has hx of constipation.  Plan on FU on CT. Getting IV lasix here. If CT WNL can dc home Physical Exam  BP (!) 135/95   Pulse 77   Temp 98.2 F (36.8 C) (Oral)   Resp (!) 23   SpO2 95%   Physical Exam Vitals and nursing note reviewed.  Constitutional:      General: She is not in acute distress.    Appearance: She is well-developed. She is not ill-appearing.  HENT:     Head: Atraumatic.  Eyes:     Pupils: Pupils are equal, round, and reactive to light.  Cardiovascular:     Rate and Rhythm: Normal rate.  Pulmonary:     Effort: No respiratory distress.  Abdominal:     General: Bowel sounds are normal. There is no distension.     Palpations: Abdomen is soft.     Tenderness: There is generalized abdominal tenderness. There is no right CVA tenderness, left CVA tenderness, guarding or rebound. Negative signs include Murphy's sign and McBurney's sign.  Musculoskeletal:        General: Normal range of motion.     Cervical back: Normal range of motion.     Comments: Trace edema BLE  Skin:    General: Skin is warm and dry.  Neurological:     General: No focal deficit present.     Mental Status: She is alert.  Psychiatric:        Mood and Affect: Mood normal.    Procedures  Procedures Labs Reviewed  BASIC METABOLIC PANEL - Abnormal; Notable for the following components:      Result Value   Glucose, Bld 110 (*)    Creatinine, Ser 1.18 (*)    GFR, Estimated 47 (*)    All other components within normal limits  CBC - Abnormal; Notable for the following components:   RBC 3.71 (*)    MCV 100.5 (*)    All other components within normal limits  BRAIN NATRIURETIC PEPTIDE - Abnormal; Notable for the following components:   B Natriuretic Peptide  139.8 (*)    All other components within normal limits  TROPONIN I (HIGH SENSITIVITY)   CT ABDOMEN PELVIS W CONTRAST  Result Date: 10/05/2023 CLINICAL DATA:  Shortness of breath. Chest pain. Irritable bowel syndrome. Congestion. Abdominal distension, worsening. Prior colectomy. EXAM: CT ABDOMEN AND PELVIS WITH CONTRAST TECHNIQUE: Multidetector CT imaging of the abdomen and pelvis was performed using the standard protocol following bolus administration of intravenous contrast. RADIATION DOSE REDUCTION: This exam was performed according to the departmental dose-optimization program which includes automated exposure control, adjustment of the mA and/or kV according to patient size and/or use of iterative reconstruction technique. CONTRAST:  60mL OMNIPAQUE IOHEXOL 350 MG/ML SOLN COMPARISON:  06/13/2023 FINDINGS: Lower chest: Trace bilateral pleural effusions. Descending thoracic aortic atherosclerosis. Small type 1 hiatal hernia. Mild dependent subsegmental atelectasis in both lower lobes. Hepatobiliary: Cholecystectomy. Mild prominence of the extrahepatic biliary tree is probably a physiologic response to cholecystectomy. Pancreas: Unremarkable Spleen: Punctate calcification in the spleen likely from old granulomatous disease. Adrenals/Urinary Tract: Small hypodense renal lesions too small to characterize but likely small benign cysts. No further imaging workup of these lesions is indicated. Punctate nonobstructive bilateral renal calculi. Urinary bladder unremarkable. Stomach/Bowel: Subtotal  colectomy with anastomosis along the sigmoid colon. Scattered air-fluid levels in nondilated small bowel, likely incidental. Vascular/Lymphatic: Atherosclerosis is present, including aortoiliac atherosclerotic disease. Reproductive: Uterus absent.  Adnexa unremarkable. Other: No supplemental non-categorized findings. 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 3 mm of grade 1 degenerative  retrolisthesis at L2-3. IMPRESSION: 1. Trace bilateral pleural effusions with mild dependent subsegmental atelectasis in both lower lobes. 2. Small type 1 hiatal hernia. 3. Punctate nonobstructive bilateral renal calculi. 4. Subtotal colectomy with anastomosis along the sigmoid colon. 5. 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 6. 3 mm of grade 1 degenerative retrolisthesis at L2-3. 7. Aortic atherosclerosis. Aortic Atherosclerosis (ICD10-I70.0). Electronically Signed   By: Gaylyn Rong M.D.   On: 10/05/2023 17:49   DG Chest 2 View  Result Date: 10/05/2023 CLINICAL DATA:  Shortness of breath. EXAM: CHEST - 2 VIEW COMPARISON:  Apr 06, 2022. FINDINGS: The heart size and mediastinal contours are within normal limits. No acute pulmonary disease is noted. The visualized skeletal structures are unremarkable. IMPRESSION: No active cardiopulmonary disease. Electronically Signed   By: Lupita Raider M.D.   On: 10/05/2023 13:16   ECHOCARDIOGRAM COMPLETE  Result Date: 09/22/2023    ECHOCARDIOGRAM REPORT   Patient Name:   Danielle Gaines [facility] Date of Exam: 09/22/2023 Medical Rec #:  [id number]      Height:       61.0 in Accession #:    [id number]     Weight:       140.8 lb Date of Birth:  13-Jul-1941       BSA:          1.627 m Patient Age:    82 years       BP:           105/64 mmHg Patient Gender: F              HR:           56 bpm. Exam Location:  [address] Procedure: 2D Echo, 3D Echo, Cardiac Doppler and Color Doppler Indications:    R07.89 Chest tightness  History:        Patient has prior history of Echocardiogram examinations, most                 recent 02/06/2010. Stroke and CKD; Signs/Symptoms:Shortness of                 Breath.  Sonographer:    Clearence Ped RCS Referring Phys: [id number] THOMAS A KELLY IMPRESSIONS  1. Left ventricular ejection fraction, by estimation, is 60 to 65%. The left ventricle has normal function. The left ventricle has no regional wall motion abnormalities. Left  ventricular diastolic parameters were normal. The average left ventricular global longitudinal strain is -19.8 %. The global longitudinal strain is normal.  2. Right ventricular systolic function is normal. The right ventricular size is normal. There is normal pulmonary artery systolic pressure. The estimated right ventricular systolic pressure is 28.0 mmHg.  3. The mitral valve is normal in structure. Mild mitral valve regurgitation. No evidence of mitral stenosis.  4. The aortic valve is normal in structure. Aortic valve regurgitation is not visualized. No aortic stenosis is present.  5. The inferior vena cava is normal in size with greater than 50% respiratory variability, suggesting right atrial pressure of 3 mmHg. FINDINGS  Left Ventricle: Left ventricular ejection fraction, by estimation, is 60 to 65%. The left ventricle has normal function. The left ventricle has no regional wall  motion abnormalities. The average left ventricular global longitudinal strain is -19.8 %. The global longitudinal strain is normal. The left ventricular internal cavity size was normal in size. There is no left ventricular hypertrophy. Left ventricular diastolic parameters were normal. Normal left ventricular filling pressure. Right Ventricle: The right ventricular size is normal. No increase in right ventricular wall thickness. Right ventricular systolic function is normal. There is normal pulmonary artery systolic pressure. The tricuspid regurgitant velocity is 2.50 m/s, and  with an assumed right atrial pressure of 3 mmHg, the estimated right ventricular systolic pressure is 28.0 mmHg. Left Atrium: Left atrial size was normal in size. Right Atrium: Right atrial size was normal in size. Pericardium: There is no evidence of pericardial effusion. Mitral Valve: The mitral valve is normal in structure. Mild mitral annular calcification. Mild mitral valve regurgitation. No evidence of mitral valve stenosis. Tricuspid Valve: The  tricuspid valve is normal in structure. Tricuspid valve regurgitation is mild . No evidence of tricuspid stenosis. Aortic Valve: The aortic valve is normal in structure. Aortic valve regurgitation is not visualized. No aortic stenosis is present. Pulmonic Valve: The pulmonic valve was normal in structure. Pulmonic valve regurgitation is trivial. No evidence of pulmonic stenosis. Aorta: The aortic root is normal in size and structure. Venous: The inferior vena cava is normal in size with greater than 50% respiratory variability, suggesting right atrial pressure of 3 mmHg. IAS/Shunts: No atrial level shunt detected by color flow Doppler.  LEFT VENTRICLE PLAX 2D LVIDd:         3.40 cm   Diastology LVIDs:         2.50 cm   LV e' medial:    12.10 cm/s LV PW:         0.90 cm   LV E/e' medial:  8.5 LV IVS:        0.70 cm   LV e' lateral:   9.14 cm/s LVOT diam:     1.70 cm   LV E/e' lateral: 11.3 LV SV:         51 LV SV Index:   32        2D Longitudinal Strain LVOT Area:     2.27 cm  2D Strain GLS (A2C):   -21.9 %                          2D Strain GLS (A3C):   -17.5 %                          2D Strain GLS (A4C):   -20.1 %                          2D Strain GLS Avg:     -19.8 %                           3D Volume EF:                          3D EF:        64 %                          LV EDV:       70 ml  LV ESV:       25 ml                          LV SV:        44 ml RIGHT VENTRICLE RV Basal diam:  2.80 cm RV S prime:     12.90 cm/s TAPSE (M-mode): 2.2 cm RVSP:           28.0 mmHg LEFT ATRIUM             Index        RIGHT ATRIUM           Index LA diam:        3.10 cm 1.91 cm/m   RA Pressure: 3.00 mmHg LA Vol (A2C):   28.1 ml 17.27 ml/m  RA Area:     7.67 cm LA Vol (A4C):   30.4 ml 18.68 ml/m  RA Volume:   13.60 ml  8.36 ml/m LA Biplane Vol: 29.2 ml 17.95 ml/m  AORTIC VALVE LVOT Vmax:   101.00 cm/s LVOT Vmean:  63.100 cm/s LVOT VTI:    0.226 m  AORTA Ao Root diam: 2.70 cm Ao Asc diam:   3.20 cm MITRAL VALVE                TRICUSPID VALVE MV Area (PHT):              TR Peak grad:   25.0 mmHg MV Decel Time:              TR Vmax:        250.00 cm/s MV E velocity: 103.00 cm/s  Estimated RAP:  3.00 mmHg MV A velocity: 89.10 cm/s   RVSP:           28.0 mmHg MV E/A ratio:  1.16                             SHUNTS                             Systemic VTI:  0.23 m                             Systemic Diam: 1.70 cm Armanda Magic MD Electronically signed by Armanda Magic MD Signature Date/Time: 09/22/2023/3:00:47 PM    Final     ED Course / MDM   Clinical Course as of 10/05/23 1830  Wed Oct 05, 2023  1506 Acute on chronic LE edema. Constipation. Took 40 mg Lasix today. Getting IV here Lasix. Ambulatory wo hypoxia. Can dc home likely [BH]    Clinical Course User Index [BH] Tarius Stangelo A, PA-C  Care assumed by previous provider.  See note for full HPI  In summation 82 year old history of CHF here for increased in Edema over the last week. Has some chronic edema and SOB. No hypoxia, ambulatory here. Feels edematous abdomen however no fluid wave. Has hx of constipation. Took Additional lasix  Plan on FU on CT. Getting IV lasix here. If CT WNL can dc home  Labs and imaging personally viewed and interpreted:  Trop 6-- does not need delta given timing of sx. BNP 139 CBC without leukocytosis Glucose 110, creatinine 1.18 DG chest without edema CT AP  Trace bilateral pleural effusions with mild dependent  subsegmental atelectasis  in both lower lobes.  2. Small type 1 hiatal hernia.  3. Punctate nonobstructive bilateral renal calculi.  4. Subtotal colectomy with anastomosis along the sigmoid colon.  5. 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  6. 3 mm of grade 1 degenerative retrolisthesis at L2-3.  7. Aortic atherosclerosis.    Patient reassessed.  We discussed labs and imaging.  She does have some mild pleural effusions on CT abdomen/pelvis.  Will have her do  40 mg Lasix that her nephrologist gave her today over the next 2 days.  She also speaks how she has prior subtotal colectomy and alternates between diarrhea and constipation however cannot remember her last "normal" bowel movement.  Typically does 1 capful of MiraLAX daily.  I recommended increasing this until normal bowel movement however watch her liquids due to her fluid overload.  Will have him follow-up outpatient return for any worsening symptoms  At this time I low suspicion for acute ACS, PE, dissection, unstable angina, fluid overload causing hypoxia, SBO, perforation, infectious process, AAA, dissection.  The patient has been appropriately medically screened and/or stabilized in the ED. I have low suspicion for any other emergent medical condition which would require further screening, evaluation or treatment in the ED or require inpatient management.  Patient is hemodynamically stable and in no acute distress.  Patient able to ambulate in department prior to ED.  Evaluation does not show acute pathology that would require ongoing or additional emergent interventions while in the emergency department or further inpatient treatment.  I have discussed the diagnosis with the patient and answered all questions.  Pain is been managed while in the emergency department and patient has no further complaints prior to discharge.  Patient is comfortable with plan discussed in room and is stable for discharge at this time.  I have discussed strict return precautions for returning to the emergency department.  Patient was encouraged to follow-up with PCP/specialist refer to at discharge.   Medical Decision Making Amount and/or Complexity of Data Reviewed Independent Historian: spouse External Data Reviewed: labs, radiology, ECG and notes. Labs: ordered. Decision-making details documented in ED Course. Radiology: ordered and independent interpretation performed. Decision-making details documented in ED  Course. ECG/medicine tests: ordered and independent interpretation performed. Decision-making details documented in ED Course.  Risk OTC drugs. Prescription drug management. Parenteral controlled substances. Decision regarding hospitalization. Diagnosis or treatment significantly limited by social determinants of health.         Placida Cambre A, PA-C 10/05/23 1830    Gloris Manchester, MD 10/05/23 2326

## 2023-10-06 ENCOUNTER — Telehealth: Payer: Self-pay | Admitting: Cardiovascular Disease

## 2023-10-06 ENCOUNTER — Telehealth: Payer: Self-pay | Admitting: Family Medicine

## 2023-10-06 NOTE — Telephone Encounter (Signed)
Wants to let provider know wife went to ED yesterday and to please review the notes

## 2023-10-06 NOTE — Telephone Encounter (Signed)
Patient's husband is calling to let Dr. Tresa Endo know that patient was seen in ED 11/06. He states he was told to let cardiologist know that results should be available to view. Requesting call back if any changes are needed.

## 2023-10-08 NOTE — Telephone Encounter (Signed)
Prior Authorization initiated for [employer] via [email or web address] KEY: [id number]

## 2023-10-12 DIAGNOSIS — H353221 Exudative age-related macular degeneration, left eye, with active choroidal neovascularization: Secondary | ICD-10-CM | POA: Diagnosis not present

## 2023-10-14 NOTE — Telephone Encounter (Signed)
Prior Auth for [facility] APPROVED PA# [id number] Valid: 05/16/20-11/28/24

## 2023-10-19 MED ORDER — DENOSUMAB 60 MG/ML ~~LOC~~ SOSY
60.0000 mg | PREFILLED_SYRINGE | Freq: Once | SUBCUTANEOUS | Status: AC
Start: 2023-10-19 — End: ?

## 2023-10-19 NOTE — Addendum Note (Signed)
Addended by: Dierdre Searles on: 10/19/2023 02:42 PM   Modules accepted: Orders

## 2023-10-19 NOTE — Telephone Encounter (Signed)
Pt ready for scheduling on or after 10/14/23  Out-of-pocket cost due at time of visit: $346  Primary: [employer] H[employer] co-insurance: 20% (approximately $320.99) Admin fee co-insurance: 20% (approximately $25)  Deductible: does not apply  Prior Auth for [employer] APPROVED PA# [id number] Valid: 05/16/20-11/28/24  Secondary: N/A [employer] co-insurance:  Admin fee co-insurance:  Deductible:  Prior Auth:  PA# Valid:   ** This summary of benefits is an estimation of the patient's out-of-pocket cost. Exact cost may vary based on individual plan coverage.

## 2023-10-20 ENCOUNTER — Telehealth: Payer: Self-pay

## 2023-10-20 NOTE — Telephone Encounter (Signed)
Transition Care Management Unsuccessful Follow-up Telephone Call  Date of discharge and from where:  10/05/2023 [facility]  Attempts:  2nd Attempt  Reason for unsuccessful TCM follow-up call:  Left voice message  Jentzen Minasyan [facility]  [facility], [facility] Resource Care Guide Direct Dial: [phone]  Website: [email or web address]

## 2023-10-20 NOTE — Telephone Encounter (Signed)
Transition Care Management Unsuccessful Follow-up Telephone Call  Date of discharge and from where:  10/05/2023 [facility]  Attempts:  1st Attempt  Reason for unsuccessful TCM follow-up call:  No answer/busy  Giuseppe Duchemin [facility]  [facility], [facility] Resource Care Guide Direct Dial: [phone]  Website: [email or web address]

## 2023-10-21 DIAGNOSIS — R0609 Other forms of dyspnea: Secondary | ICD-10-CM | POA: Diagnosis not present

## 2023-10-21 DIAGNOSIS — R0789 Other chest pain: Secondary | ICD-10-CM | POA: Diagnosis not present

## 2023-10-22 LAB — COMPREHENSIVE METABOLIC PANEL
ALT: 21 [IU]/L (ref 0–32)
AST: 22 [IU]/L (ref 0–40)
Albumin: 4.5 g/dL (ref 3.8–4.8)
Alkaline Phosphatase: 80 [IU]/L (ref 44–121)
BUN/Creatinine Ratio: 13 (ref 12–28)
BUN: 16 mg/dL (ref 8–27)
Bilirubin Total: 0.2 mg/dL (ref 0.0–1.2)
CO2: 26 mmol/L (ref 20–29)
Calcium: 9.7 mg/dL (ref 8.7–10.3)
Chloride: 95 mmol/L — ABNORMAL LOW (ref 96–106)
Creatinine, Ser: 1.22 mg/dL — ABNORMAL HIGH (ref 0.57–1.00)
Globulin, Total: 2.8 g/dL (ref 1.5–4.5)
Glucose: 88 mg/dL (ref 70–99)
Potassium: 4.8 mmol/L (ref 3.5–5.2)
Sodium: 137 mmol/L (ref 134–144)
Total Protein: 7.3 g/dL (ref 6.0–8.5)
eGFR: 45 mL/min/{1.73_m2} — ABNORMAL LOW (ref >59)

## 2023-10-22 LAB — CBC

## 2023-10-22 LAB — LIPID PANEL
Chol/HDL Ratio: 3.6 {ratio} (ref 0.0–4.4)
Cholesterol, Total: 196 mg/dL (ref 100–199)
HDL: 54 mg/dL (ref >39)
LDL Chol Calc (NIH): 103 mg/dL — ABNORMAL HIGH (ref 0–99)
Triglycerides: 232 mg/dL — ABNORMAL HIGH (ref 0–149)
VLDL Cholesterol Cal: 39 mg/dL (ref 5–40)

## 2023-10-22 LAB — TSH: TSH: 2.88 u[IU]/mL (ref 0.450–4.500)

## 2023-10-22 LAB — LIPOPROTEIN A (LPA): Lipoprotein (a): <8.4 nmol/L (ref <75.0)

## 2023-10-28 ENCOUNTER — Ambulatory Visit (HOSPITAL_COMMUNITY): Payer: Medicare HMO

## 2023-10-31 ENCOUNTER — Telehealth (HOSPITAL_COMMUNITY): Payer: Self-pay | Admitting: *Deleted

## 2023-10-31 MED ORDER — METOPROLOL TARTRATE 25 MG PO TABS: ORAL_TABLET | ORAL | 0 refills | Status: DC

## 2023-10-31 NOTE — Telephone Encounter (Signed)
Reaching out to patient to offer assistance regarding upcoming cardiac imaging study; spoke to spouse (ok per DPR) verbalizes understanding of appt date/time, parking situation and where to check in, pre-test NPO status and medications ordered, and verified current allergies; name and call back number provided for further questions should they arise Johney Frame RN Navigator Cardiac Imaging [facility] [phone] office [phone] cell

## 2023-11-01 ENCOUNTER — Ambulatory Visit (HOSPITAL_COMMUNITY)
Admission: RE | Admit: 2023-11-01 | Discharge: 2023-11-01 | Disposition: A | Discharge status: Home / Self Care | Payer: Medicare HMO | Source: Ambulatory Visit | Attending: Cardiovascular Disease | Admitting: Cardiovascular Disease

## 2023-11-01 ENCOUNTER — Ambulatory Visit (HOSPITAL_BASED_OUTPATIENT_CLINIC_OR_DEPARTMENT_OTHER): Payer: Medicare HMO

## 2023-11-01 ENCOUNTER — Other Ambulatory Visit (HOSPITAL_COMMUNITY): Payer: Self-pay | Admitting: Cardiovascular Disease

## 2023-11-01 DIAGNOSIS — R0789 Other chest pain: Secondary | ICD-10-CM | POA: Diagnosis not present

## 2023-11-01 DIAGNOSIS — R931 Abnormal findings on diagnostic imaging of heart and coronary circulation: Secondary | ICD-10-CM | POA: Diagnosis not present

## 2023-11-01 DIAGNOSIS — I2510 Atherosclerotic heart disease of native coronary artery without angina pectoris: Secondary | ICD-10-CM | POA: Diagnosis not present

## 2023-11-01 MED ORDER — METOPROLOL TARTRATE 5 MG/5ML IV SOLN
INTRAVENOUS | Status: AC
  Filled 2023-11-01: qty 5

## 2023-11-01 MED ORDER — METOPROLOL TARTRATE 5 MG/5ML IV SOLN: 10.0000 mg | Freq: Once | INTRAVENOUS | Status: DC | PRN

## 2023-11-01 MED ORDER — NITROGLYCERIN 0.4 MG SL SUBL
0.8000 mg | SUBLINGUAL_TABLET | Freq: Once | SUBLINGUAL | Status: AC
  Administered 2023-11-01: 0.8 mg via SUBLINGUAL

## 2023-11-01 MED ORDER — DILTIAZEM HCL 25 MG/5ML IV SOLN: 10.0000 mg | INTRAVENOUS | Status: DC | PRN

## 2023-11-01 MED ORDER — NITROGLYCERIN 0.4 MG SL SUBL
SUBLINGUAL_TABLET | SUBLINGUAL | Status: AC
  Filled 2023-11-01: qty 2

## 2023-11-01 MED ORDER — IOHEXOL 350 MG/ML SOLN
95.0000 mL | Freq: Once | INTRAVENOUS | Status: AC | PRN
  Administered 2023-11-01: 95 mL via INTRAVENOUS

## 2023-11-02 ENCOUNTER — Ambulatory Visit: Payer: Medicare HMO | Admitting: Cardiovascular Disease

## 2023-11-02 ENCOUNTER — Other Ambulatory Visit (HOSPITAL_COMMUNITY): Payer: Self-pay | Admitting: Emergency Medicine

## 2023-11-02 ENCOUNTER — Other Ambulatory Visit: Payer: Self-pay

## 2023-11-02 ENCOUNTER — Ambulatory Visit (HOSPITAL_COMMUNITY)
Admission: RE | Admit: 2023-11-02 | Discharge: 2023-11-02 | Disposition: A | Discharge status: Home / Self Care | Payer: Medicare HMO | Source: Ambulatory Visit | Attending: Cardiology | Admitting: Cardiology

## 2023-11-02 DIAGNOSIS — R931 Abnormal findings on diagnostic imaging of heart and coronary circulation: Secondary | ICD-10-CM

## 2023-11-02 DIAGNOSIS — R0789 Other chest pain: Secondary | ICD-10-CM | POA: Diagnosis not present

## 2023-11-02 DIAGNOSIS — I2510 Atherosclerotic heart disease of native coronary artery without angina pectoris: Secondary | ICD-10-CM | POA: Diagnosis not present

## 2023-11-08 ENCOUNTER — Encounter: Payer: Self-pay | Admitting: Cardiovascular Disease

## 2023-11-08 ENCOUNTER — Ambulatory Visit (HOSPITAL_COMMUNITY): Payer: Medicare HMO

## 2023-11-08 ENCOUNTER — Ambulatory Visit: Payer: Medicare HMO | Attending: Cardiovascular Disease | Admitting: Cardiovascular Disease

## 2023-11-08 VITALS — BP 120/80 | HR 70 | Ht 61.0 in | Wt 152.4 lb

## 2023-11-08 DIAGNOSIS — K219 Gastro-esophageal reflux disease without esophagitis: Secondary | ICD-10-CM | POA: Diagnosis not present

## 2023-11-08 DIAGNOSIS — R0609 Other forms of dyspnea: Secondary | ICD-10-CM

## 2023-11-08 DIAGNOSIS — H3530 Unspecified macular degeneration: Secondary | ICD-10-CM

## 2023-11-08 DIAGNOSIS — M940 Chondrocostal junction syndrome [Tietze]: Secondary | ICD-10-CM

## 2023-11-08 DIAGNOSIS — E782 Mixed hyperlipidemia: Secondary | ICD-10-CM

## 2023-11-08 DIAGNOSIS — K222 Esophageal obstruction: Secondary | ICD-10-CM

## 2023-11-08 DIAGNOSIS — I951 Orthostatic hypotension: Secondary | ICD-10-CM

## 2023-11-08 MED ORDER — ATORVASTATIN CALCIUM 40 MG PO TABS
40.0000 mg | ORAL_TABLET | Freq: Every day | ORAL | 3 refills | Status: AC
Start: 2023-11-08 — End: 2024-09-06

## 2023-11-08 MED ORDER — AMLODIPINE BESYLATE 2.5 MG PO TABS: 2.5000 mg | ORAL_TABLET | Freq: Every day | ORAL | 3 refills | Status: DC

## 2023-11-08 NOTE — Progress Notes (Unsigned)
Cardiology Office Note    Date:  11/09/2023   ID:  Danielle, Gaines 1941-11-10, MRN [id number]  PCP:  Danielle Georges, MD  Cardiologist:  Danielle Guadalajara, MD   2 month F/U cardiology evaluation initially referred by Dr. Nira Gaines for chest tightness   History of Present Illness:  Danielle Gaines is a 82 y.o. female who I had seen in March 2017 when she was referred by Dr. Kinnie Gaines for cardiology evaluation prior to undergoing colonoscopy.  At the time, she had a longstanding history of irritable bowel syndrome and 20 years previously had undergone partial colectomy.  There is a questional history of possible remote CVA which was not revealed on CT and MRI imaging.  She had a history of major depressive disorder.  An echo Doppler study showed an EF of 55 to 60%.  In 2016 she had had an ER evaluation for atypical chest pain.  When I saw her, her ECG showed sinus rhythm at 65 bpm with QS complex in V1 and V2 unchanged from prior electrocardiograms.  She was given clearance to undergo her colonoscopy procedure.  I have not seen Ms. Danielle Gaines since her 2017 evaluation.  She re- presented for cardiology evaluation on August 31, 2023.  She now is being followed by Dr. Nira Gaines at [facility].  For the past 6 months, she has experienced episodes of heaviness in her chest.  Often these are nonexertional.  At times she may take 2 or 3 Tums with benefit.  She does have a history of GERD.  Apparently she had undergone subsequent endoscopy with Dr. Jola Gaines and had undergone dilatation of esophageal stricture.  She admits to occasional shortness of breath and if she stands for long time she does experience some mild lightheadedness.  At times she experiences some occasional sharp chest pain which can come and go anytime throughout the day and is unassociated with difficulty swallowing.  She does have macular degeneration worse in her right eye and glaucoma.  Laboratory on May 17, 2023 showed a total  cholesterol 276, triglycerides 316, VLDL 63, and direct LDL 156.  Creatinine was 1.29.  She was recently started on atorvastatin by Dr. Casimiro Needle for hyperlipidemia.  Her present medications now include atorvastatin 20 mg daily, furosemide 20 mg.  She takes Linzess for irritable bowel syndrome, Prolia for osteoporosis.  She is on Quipka by her urologist for kidney stones.  She takes [place] as needed for migraines.  During my evaluation, I suspected that her chest discomfort was noncardiac in etiology.  I scheduled her for a 2D echo Doppler study.  With her marked hyperlipidemia I recommended she undergo coronary CTA assessment.  I discussed low-dose trial of nonsteroidal anti-inflammatory medication to see if this could improve her chest wall tenderness to palpation.  I also recommended subsequent comprehensive laboratory.  She underwent an echo Doppler study on September 22, 2023.  LV function was normal at 60 to 65%.  She had normal diastolic parameters and normal strain pattern.  There was mild MR.  Coronary CTA on November 01, 2023 showed a calcium score of 0.  There was moderate non calcified plaque in the proximal RCA with a range of 50 to 69%.  FFR analysis was performed and this was not hemodynamically significant.  Laboratory from October 21, 2023 showed creatinine 1.22.  Total cholesterol is 196, triglycerides 232, LDL cholesterol 103.  LP(a) was excellent at less than 8.4.  TSH was normal.  Presently, she continues to experience a chest sensation.  She denies any exertional symptomatology.  She is on atorvastatin 20 mg, furosemide 40 mg daily, omeprazole for GERD, and takes Ubrelvy for migraine headaches.  She presents for reevaluation.   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 Evelene Gaines   Diastolic heart failure [facility])    mild on echo 2016 no cardiologist   GERD (gastroesophageal reflux disease)    IBS (irritable bowel syndrome)    sees Dr. Kinnie Gaines   Macular degeneration    goes to [facility] for this   Migraine    managed by neurologist   Osteoporosis    Sty    right eye   Uvulitis    Vertigo    seeing  dr Gaines for [facility] 08-11-2021 epic   Wears glasses    for reading   Wears hearing aid in both ears     Past Surgical History:  Procedure Laterality Date   ABDOMINAL ADHESION SURGERY     ABDOMINAL HYSTERECTOMY  1972   APPENDECTOMY     CHOLECYSTECTOMY  1996   colonscopy  1989   CYSTOSCOPY W/ URETERAL STENT PLACEMENT Right 04/27/2016   Procedure: CYSTOSCOPY WITH RETROGRADE PYELOGRAM/ RIGHT URETERAL STENT PLACEMENT;  Surgeon: Danielle Fat, MD;  Location: [facility];  Service: Urology;  Laterality: Right;   CYSTOSCOPY WITH RETROGRADE PYELOGRAM, URETEROSCOPY AND STENT PLACEMENT Left 10/09/2021   Procedure: CYSTOSCOPY WITH LEFT RETROGRADE URETEROSCOPY WITH HOLMIUM LASER AND STENT PLACEMENT;  Surgeon: Danielle Pippin, MD;  [facility]: [facility];  Service: Urology;  Laterality: Left;   ESOPHAGOGASTRODUODENOSCOPY     2002, 2011, 2014, 2021   EXTRACORPOREAL SHOCK WAVE LITHOTRIPSY Left 08/31/2021   Procedure: LEFT EXTRACORPOREAL SHOCK WAVE LITHOTRIPSY (ESWL);  Surgeon: Danielle Fat, MD;  Location: [facility];  Service: Urology;  Laterality: Left;   FLEXIBLE SIGMOIDOSCOPY     1991, 1994, 2006, 2011, 2021   HOLMIUM LASER APPLICATION Left 10/09/2021   Procedure: HOLMIUM LASER APPLICATION;  Surgeon: Danielle Pippin, MD;  Location: [facility];  Service: Urology;  Laterality: Left;   lasix eye surgery     ou    MEMBRANECTOMY Left 09/21/2013   OVARIAN CYST SURGERY     PARS PLANA VITRECTOMY Left 09/21/2013   SMALL INTESTINE SURGERY     SUBTOTAL COLECTOMY     1991, 1994, 2006, 2011   tumor removal right shoulder  06/22/2021    Current Medications: Outpatient Medications Prior to Visit  Medication Sig Dispense Refill   acetaminophen (TYLENOL) 500 MG tablet Take 1,000 mg by mouth daily as needed for headache.     Apoaequorin (PREVAGEN PO) Take by mouth daily.     Atogepant (QULIPTA) 60 MG TABS Take 60 mg by mouth daily.     betamethasone valerate ointment (VALISONE) 0.1 % Use a pea sized amount 2 x a day for up to one week. Not for daily long term use. You can use a pea  sized amount topically 1-2 x a week as needed. 30 g 1   brimonidine (ALPHAGAN P) 0.1 % SOLN Apply to eye.     cycloSPORINE (RESTASIS) 0.05 % ophthalmic emulsion Place 1 drop into both eyes 2 (two) times daily.     denosumab (PROLIA) 60 MG/ML SOSY injection Inject 60 mg into the skin every 6 (six) months.     dexamethasone (DECADRON) 0.1 % ophthalmic solution Place 1 drop into both eyes 4 (four) times daily.     dorzolamidel-timolol (COSOPT) 22.3-6.8 MG/ML SOLN ophthalmic solution 1 drop 2 (two) times daily.     doxycycline (VIBRAMYCIN) 50 MG capsule Take 50 mg by mouth every 12 (twelve) hours.     estradiol (ESTRACE) 0.1 MG/GM vaginal cream Place vaginally.     folic acid (FOLVITE) 1 MG tablet Take by mouth.     furosemide (LASIX) 20 MG tablet Take 40 mg by mouth daily.     LINZESS 290 MCG CAPS capsule Take 290 mcg by mouth daily before breakfast.   0   LORazepam (ATIVAN) 0.5 MG tablet Take 0.5 mg by mouth in the morning, at noon, and at bedtime. 1 in am 1 at midday 2 at hs     meclizine (ANTIVERT) 25 MG tablet TAKE 1 TABLET(25 MG) BY MOUTH THREE TIMES DAILY AS NEEDED FOR DIZZINESS 30 tablet 2   metoprolol tartrate (LOPRESSOR) 25 MG tablet TAKE TABLET 2 HOURS BEFORE CARDIAC CT 1 tablet 0   neomycin-polymyxin b-dexamethasone  (MAXITROL) 3.5-10000-0.1 OINT      olopatadine (PATANOL) 0.1 % ophthalmic solution 1 drop 2 (two) times daily.     omeprazole (PRILOSEC) 40 MG capsule Take 40 mg by mouth daily.     ondansetron (ZOFRAN) 4 MG tablet Take 1 tablet (4 mg total) by mouth every 8 (eight) hours as needed for nausea or vomiting. 30 tablet 1   Probiotic Product (ALIGN PO) Take by mouth daily.     tamsulosin (FLOMAX) 0.4 MG CAPS capsule Take 0.4 mg by mouth at bedtime.     Travoprost, BAK Free, (TRAVATAN) 0.004 % SOLN ophthalmic solution 1 drop at bedtime.     tretinoin (RETIN-A) 0.025 % cream APPLY A PEA SIZED AMOUNT TO FACE ONCE DAILY IN THE EVENING     UBRELVY 100 MG TABS      ZENPEP 15000 units CPEP Take 45,000 mg by mouth 2 (two) times daily.  0   atorvastatin (LIPITOR) 20 MG tablet Take 1 tablet (20 mg total) by mouth daily. 90 tablet 1   citalopram (CELEXA) 20 MG tablet Take 20 mg by mouth daily.     pantoprazole (PROTONIX) 40 MG tablet Take 1 tablet (40 mg total) by mouth daily. 30 tablet 2   QULIPTA 60 MG TABS Take 1 tablet by mouth daily.     Facility-Administered Medications Prior to Visit  Medication Dose Route Frequency Provider Last Rate Last Admin   denosumab (PROLIA) injection 60 mg  60 mg Subcutaneous Once Carlus Pavlov, MD         Allergies:   Elemental sulfur, Latanoprost, and Sulfa antibiotics   Social History   Socioeconomic History   Marital status: Married    Spouse name: Not on file   Number of children: Not on file   Years of education: Not on file   Highest education level: Not on file  Occupational History   Not on file  Tobacco Use   Smoking status: Never   Smokeless tobacco: Never  Vaping Use  Vaping status: Never Used  Substance and Sexual Activity   Alcohol use: No   Drug use: No   Sexual activity: Not Currently    Birth control/protection: Post-menopausal  Other Topics Concern   Not on file  Social History Narrative   Work or School: none      Home Situation:  lives with husband      Spiritual Beliefs:       Lifestyle: no regular exercise; diet is ok         Social Determinants of Corporate investment banker Strain: Not on file  Food Insecurity: No Food Insecurity (06/02/2022)   Received from [facility]   Hunger Vital Sign    Worried About Running Out of Food in the Last Year: Never true    Ran Out of Food in the Last Year: Never true  Transportation Needs: Not on file  Physical Activity: Not on file  Stress: Not on file  Social Connections: Unknown (04/11/2022)   Received from [facility], [facility]   Social Network    Social Network: Not on file    Social history is notable that she was born in [place].  She is married for 34 years, has 1 child, 6 grandchildren and 4 great-grandchildren.  She previously had worked as a Scientist, physiological for [employer].  She completed 12 grade of education.  There is no tobacco or alcohol use.  She does not routinely exercise.  Family History:  The patient's family history includes Cancer in her maternal aunt; Depression in an other family member; Heart Problems in her sister; Heart disease in her maternal grandmother and mother; Kidney disease in her mother; Osteoporosis in her mother; Stroke in her maternal grandmother and another family member.  Her mother died at age 44 with heart failure.  She is not aware of her father.  She has a sister age 54 who had a pacemaker.  Her 1 child is 29 years old.  ROS General: Negative; No fevers, chills, or night sweats;  HEENT: Glaucoma, macular degeneration worse in right eye; No changes in hearing, sinus congestion, difficulty swallowing Pulmonary: Negative; No cough, wheezing, shortness of breath, hemoptysis Cardiovascular: See HPI GI: History of esophageal dilatation; GERD GU: Negative; No dysuria, hematuria, or difficulty voiding Musculoskeletal: Negative; no myalgias, joint pain, or weakness Hematologic/Oncology: Negative; no  easy bruising, bleeding Endocrine: Negative; no heat/cold intolerance; no diabetes Neuro: History of migraine headaches Skin: Negative; No rashes or skin lesions Psychiatric: Negative; No behavioral problems, depression Sleep: Negative; No snoring, daytime sleepiness, hypersomnolence, bruxism, restless legs, hypnogognic hallucinations, no cataplexy Other comprehensive 14 point system review is negative.   PHYSICAL EXAM:   VS:  BP 120/80   Pulse 70   Ht 5\' 1"  (1.549 m)   Wt 152 lb 6.4 oz (69.1 kg)   SpO2 97%   BMI 28.80 kg/m     Repeat blood pressure by me was 136/80.  Wt Readings from Last 3 Encounters:  11/08/23 152 lb 6.4 oz (69.1 kg)  08/31/23 140 lb 12.8 oz (63.9 kg)  05/17/23 141 lb 14.4 oz (64.4 kg)    General: Alert, oriented, no distress.  Skin: normal turgor, no rashes, warm and dry HEENT: Normocephalic, atraumatic. Pupils equal round and reactive to light; sclera anicteric; extraocular muscles intact; Fundi ** Nose without nasal septal hypertrophy Mouth/Parynx benign; Mallinpatti scale 3 Neck: No JVD, no carotid bruits; normal carotid upstroke Lungs: clear to ausculatation and percussion; no wheezing or rales Chest  wall: Prior chest wall tenderness improved Heart: PMI not displaced, RRR, s1 s2 normal, 1/6 systolic murmur, no diastolic murmur, no rubs, gallops, thrills, or heaves Abdomen: soft, nontender; no hepatosplenomehaly, BS+; abdominal aorta nontender and not dilated by palpation. Back: no CVA tenderness Pulses 2+ Musculoskeletal: full range of motion, normal strength, no joint deformities Extremities: no clubbing cyanosis or edema, Homan's sign negative  Neurologic: grossly nonfocal; Cranial nerves grossly wnl Psychologic: Normal mood and affect   Studies/Labs Reviewed:   EKG Interpretation Date/Time:  Tuesday November 08 2023 11:37:33 EST Ventricular Rate:  70 PR Interval:  214 QRS Duration:  98 QT Interval:  434 QTC Calculation: 468 R  Axis:   -59  Text Interpretation: Sinus rhythm with 1st degree A-V block Left axis deviation Incomplete right bundle branch block Moderate voltage criteria for LVH, may be normal variant ( R in aVL , Cornell product ) Septal infarct (cited on or before 08-Nov-2023) When compared with ECG of 05-Oct-2023 11:08, Incomplete right bundle branch block is now Present Confirmed by Danielle Gaines ([id number]) on 11/09/2023 5:14:01 PM    Recent Labs:    Latest Ref Rng & Units 10/21/2023    2:42 PM 10/05/2023   11:34 AM 05/17/2023    3:42 PM  BMP  Glucose 70 - 99 mg/dL 88  956  87   BUN 8 - 27 mg/dL 16  9  28    Creatinine 0.57 - 1.00 mg/dL 2.13  0.86  5.78   BUN/Creat Ratio 12 - 28 13     Sodium 134 - 144 mmol/L 137  138  136   Potassium 3.5 - 5.2 mmol/L 4.8  4.3  4.4   Chloride 96 - 106 mmol/L 95  100  100   CO2 20 - 29 mmol/L 26  29  26    Calcium 8.7 - 10.3 mg/dL 9.7  9.8  9.6         Latest Ref Rng & Units 10/21/2023    2:42 PM 05/17/2023    3:42 PM 09/03/2020    1:16 PM  Hepatic Function  Total Protein 6.0 - 8.5 g/dL 7.3  7.9  8.1   Albumin 3.8 - 4.8 g/dL 4.5  4.3  4.9   AST 0 - 40 IU/L 22  20  16    ALT 0 - 32 IU/L 21  29  14    Alk Phosphatase 44 - 121 IU/L 80  51  57   Total Bilirubin 0.0 - 1.2 mg/dL 0.2  0.5  <4.6        Latest Ref Rng & Units 10/21/2023    2:42 PM 10/05/2023   11:34 AM 04/06/2022    4:03 PM  CBC  WBC x10E3/uL CANCELED  5.2  7.7   Hemoglobin 12.0 - 15.0 g/dL  96.2  95.2   Hematocrit 36.0 - 46.0 %  37.3  44.2   Platelets 150 - 400 K/uL  206  236.0    Lab Results  Component Value Date   MCV 100.5 (H) 10/05/2023   MCV 104.6 (H) 04/06/2022   MCV 101 (H) 09/03/2020   Lab Results  Component Value Date   TSH 2.880 10/21/2023   Lab Results  Component Value Date   HGBA1C 5.3 08/18/2017     BNP    Component Value Date/Time   BNP 139.8 (H) 10/05/2023 1330    ProBNP    Component Value Date/Time   PROBNP <30.0 12/29/2010 1233     Lipid Panel  Component Value Date/Time   CHOL 196 10/21/2023 1442   TRIG 232 (H) 10/21/2023 1442   HDL 54 10/21/2023 1442   CHOLHDL 3.6 10/21/2023 1442   CHOLHDL 5 05/17/2023 1542   VLDL 63.2 (H) 05/17/2023 1542   LDLCALC 103 (H) 10/21/2023 1442   LDLDIRECT 156.0 05/17/2023 1542   LABVLDL 39 10/21/2023 1442     RADIOLOGY: CT CORONARY FFR DATA PREP & FLUID ANALYSIS  Result Date: 11/07/2023 EXAM: CT FFR ANALYSIS MEDICATIONS: MEDICATIONS None FINDINGS: CT FFR analysis was performed on the original cardiac computed tomography angiogram dataset. Diagrammatic representation of the CT FFR analysis is provided in a separate PDF document in PACS. This dictation was created using the PDF document and an interactive 3D model of the results. The 3D model is not available in the EMR/PACS. Normal CT FFR range is >0.80. 1. Left Main: No significant stenosis. 2. LAD: No significant stenosis. 3. LCX: No significant stenosis. 4. RCA: No significant stenosis. IMPRESSION: 1. Noncalcified plaque proximal RCA, visually moderate (50-69%) stenosis. FFR suggests this is not hemodynamically significant. Marca Ancona, MD Electronically Signed   By: Marca Ancona M.D.   On: 11/07/2023 07:39   CT CORONARY MORPH W/CTA COR W/SCORE W/CA W/CM &/OR WO/CM  Result Date: 11/01/2023 CLINICAL DATA:  Chest pain EXAM: Cardiac CTA MEDICATIONS: Sub lingual nitro. 4mg  x 2 TECHNIQUE: The patient was scanned on a Siemens 192 slice scanner. Gantry rotation speed was 250 msecs. Collimation was 0.6 mm. A 100 kV prospective scan was triggered in the ascending thoracic aorta at 35-75% of the R-R interval. Average HR during the scan was 60 bpm. The 3D data set was interpreted on a dedicated work station using MPR, MIP and VRT modes. A total of 80cc of contrast was used. FINDINGS: Non-cardiac: See separate report from [facility]. Aortic root and ascending aorta are normal in caliber. Pulmonary veins drain normally to the left atrium. Calcium  Score: 0 Agatston units. Coronary Arteries: Right dominant with no anomalies LM: No plaque or stenosis. LAD system: No plaque or stenosis. Circumflex system: No plaque or stenosis. RCA system: Noncalcified plaque in the proximal RCA with visually moderate (50-69%) stenosis. CT FFR 0.92 distal to stenosis suggests that it is not hemodynamically significant. IMPRESSION: 1. Coronary artery calcium score 0 Agatston unts. This suggests low risk for future cardiac events. 2. Noncalcified plaque proximal RCA, visually moderate (50-69%) stenosis. FFR suggests this is not hemodynamically significant. Dalton Mclean Electronically Signed   By: Marca Ancona M.D.   On: 11/01/2023 23:38     Additional studies/ records that were reviewed today include:  Previous evaluation in 2017 by me and subsequent evaluations with additional providers were reviewed.  ASSESSMENT:    1. Dyspnea on exertion   2. Mixed hyperlipidemia   3. Costochondritis   4. Esophageal stricture   5. Gastroesophageal reflux disease, unspecified whether esophagitis present   6. Macular degeneration, unspecified laterality, unspecified type     PLAN:  Danielle Gaines is an 82 year old female who has a history of irritable bowel syndrome status post remote partial colectomy.  A remote echo Doppler study had shown normal LV function with grade 1 diastolic dysfunction.  Recently, she has had issues with chest heaviness for the past 6 months.  Her chest pain is nonexertional. When I initially saw her, she had chest wall tenderness consistent with costochondritis. .  She also has GERD and admits to some relief when she takes Tums.  She had undergone recent endoscopy by  Dr. Jola Gaines following Dr. Jennye Boroughs retirement and underwent dilatation of esophageal stricture.  She admits to shortness of breath with activity and also can experience sharp nonexertional chest pain.  She has visual issues from glaucoma and macular generation worse in her right  compared to left eye.  Laboratory has shown significant mixed hyperlipidemia with total cholesterol 276, triglycerides 316, VLDL 63 and direct LDL 156 and had been on atorvastatin for 2 months prior to my evaluation.  Subsequent laboratory in October 21, 2023 showed total cholesterol 196, triglycerides still increased at 232, LDL cholesterol improved but still elevated at 103 and HDL cholesterol at 54.  She underwent a 2D echo Doppler study which confirmed normal LV function with EF estimated at 60 to 65%.  She had normal strain pattern.  There was mild mitral regurgitation.  Coronary CTA showed a calcium score of 0 however she was noted to have noncalcified plaque in the proximal RCA in the 50 to 69% range.  Subsequent FFR analysis revealed this not to be hemodynamically significant.  Her chest wall discomfort has improved since her initial evaluation but there is still some mild residual discomfort.  She has had issues with esophageal reflux as well as prior stricture.  Some of her chest pain perhaps may be related to esophageal spasm.  Her blood pressure when rechecked by me today was 136/80.  Presently, I am electing to add low-dose amlodipine 2.5 mg to take at bedtime which will be helpful both for blood pressure control, her moderate noncalcified 50 to 69% LAD stenosis, as well as potential for esophageal spasm and etiology of her discomfort.  I am also further increasing atorvastatin to 40 mg with target LDL less than 70 with her CAD.  I discussed with her my plans for future retirement in mid 2025.  Following my retirement I will transition her to the care of Dr. Janne Napoleon at [facility].    Medication Adjustments/Labs and Tests Ordered: Current medicines are reviewed at length with the patient today.  Concerns regarding medicines are outlined above.  Medication changes, Labs and Tests ordered today are listed in the Patient Instructions below. Patient Instructions  Medication Instructions:   INCREASE Atorvastatin 40mg . Take this medication daily  BEGIN  Amlodipine 2.5mg . Take this medication at bedtime *If you need a refill on your cardiac medications before your next appointment, please call your pharmacy*   Lab Work: If you have labs (blood work) drawn today and your tests are completely normal, you will receive your results only by: MyChart Message (if you have MyChart) OR A paper copy in the mail If you have any lab test that is abnormal or we need to change your treatment, we will call you to review the results.    Follow-Up: At [facility], you and your health needs are our priority.  As part of our continuing mission to provide you with exceptional heart care, we have created designated Provider Care Teams.  These Care Teams include your primary Cardiologist (physician) and Advanced Practice Providers (APPs -  Physician Assistants and Nurse Practitioners) who all work together to provide you with the care you need, when you need it.  We recommend signing up for the patient portal called "MyChart".  Sign up information is provided on this After Visit Summary.  MyChart is used to connect with patients for Virtual Visits (Telemedicine).  Patients are able to view lab/test results, encounter notes, upcoming appointments, etc.  Non-urgent messages can be sent to your provider  as well.   To learn more about what you can do with MyChart, go to [email or web address].    Your next appointment:   8 month(s)  Provider:   Dr. Jodelle Red       Signed, Danielle Guadalajara, MD  11/09/2023 5:21 PM    [facility] [address], [place], [place]  [address] Phone: [phone]

## 2023-11-08 NOTE — Patient Instructions (Signed)
Medication Instructions:  INCREASE Atorvastatin 40mg . Take this medication daily  BEGIN  Amlodipine 2.5mg . Take this medication at bedtime *If you need a refill on your cardiac medications before your next appointment, please call your pharmacy*   Lab Work: If you have labs (blood work) drawn today and your tests are completely normal, you will receive your results only by: MyChart Message (if you have MyChart) OR A paper copy in the mail If you have any lab test that is abnormal or we need to change your treatment, we will call you to review the results.    Follow-Up: At [facility], you and your health needs are our priority.  As part of our continuing mission to provide you with exceptional heart care, we have created designated Provider Care Teams.  These Care Teams include your primary Cardiologist (physician) and Advanced Practice Providers (APPs -  Physician Assistants and Nurse Practitioners) who all work together to provide you with the care you need, when you need it.  We recommend signing up for the patient portal called "MyChart".  Sign up information is provided on this After Visit Summary.  MyChart is used to connect with patients for Virtual Visits (Telemedicine).  Patients are able to view lab/test results, encounter notes, upcoming appointments, etc.  Non-urgent messages can be sent to your provider as well.   To learn more about what you can do with MyChart, go to [email or web address].    Your next appointment:   8 month(s)  Provider:   Dr. Jodelle Red

## 2023-11-09 ENCOUNTER — Ambulatory Visit: Payer: Medicare HMO

## 2023-11-09 ENCOUNTER — Other Ambulatory Visit: Payer: Self-pay | Admitting: Family Medicine

## 2023-11-09 ENCOUNTER — Encounter: Payer: Self-pay | Admitting: Cardiovascular Disease

## 2023-11-09 DIAGNOSIS — E782 Mixed hyperlipidemia: Secondary | ICD-10-CM

## 2023-11-10 DIAGNOSIS — Z961 Presence of intraocular lens: Secondary | ICD-10-CM | POA: Diagnosis not present

## 2023-11-10 DIAGNOSIS — H0288A Meibomian gland dysfunction right eye, upper and lower eyelids: Secondary | ICD-10-CM | POA: Diagnosis not present

## 2023-11-10 DIAGNOSIS — H0288B Meibomian gland dysfunction left eye, upper and lower eyelids: Secondary | ICD-10-CM | POA: Diagnosis not present

## 2023-11-10 DIAGNOSIS — H04123 Dry eye syndrome of bilateral lacrimal glands: Secondary | ICD-10-CM | POA: Diagnosis not present

## 2023-11-15 ENCOUNTER — Telehealth: Payer: Self-pay | Admitting: Physical Medicine and Rehabilitation

## 2023-11-15 NOTE — Telephone Encounter (Signed)
Patient's spouse called asked if his wife can see Dr. Alvester Morin Friday? Dorene Sorrow said his wife's back is hurting pretty bad. Dorene Sorrow asked if Krystel can get an appointment as soon as possible. The number to contact Dorene Sorrow is [phone]

## 2023-11-16 DIAGNOSIS — H353231 Exudative age-related macular degeneration, bilateral, with active choroidal neovascularization: Secondary | ICD-10-CM | POA: Diagnosis not present

## 2023-11-21 ENCOUNTER — Telehealth: Payer: Self-pay | Admitting: Cardiovascular Disease

## 2023-11-21 NOTE — Telephone Encounter (Signed)
Called patient with no answer. Unable to leave voicemail mailbox full.

## 2023-11-21 NOTE — Telephone Encounter (Signed)
Pt c/o medication issue:  1. Name of Medication: atorvastatin (LIPITOR) 40 MG tablet   2. How are you currently taking this medication (dosage and times per day)?    3. Are you having a reaction (difficulty breathing--STAT)? no  4. What is your medication issue? Causing patient to be dizzy, sleepy and nausea. Please advise   ?

## 2023-11-22 NOTE — Telephone Encounter (Signed)
Called and spoke to patient and her husband Verified name and DOB. Per her husband patient has been having chest pain that comes and goes, left arm and back pain. She also report numbness and tingling in her hands, leg and feet. She stated she has been having these symptoms for 2-3 weeks and feel they are getting worse. Patient seen 12/10 by Dr Tresa Endo for SOB. She report she is currently having chest pain, SOB, dizziness and numbness and tingling in hands and feet. She rate her chest pain at a 5-6 at this time.She asked if her symptoms could be coming from her Lipitor and was told I could not say that was the cause of her symptoms. Patient was advised to go to the ED for evaluation. Patient asked if she could wait a couple of days to see if she feels better. I again advised her that she should go today due to current chest pain and other symptoms. She reported she feels so bad she did not feel like getting out of bed to get dressed. I again tried to encourage her to go to the ED. Patient stated she would think about it.

## 2023-11-22 NOTE — Telephone Encounter (Signed)
Called pt to follow up on last note. Left vm

## 2023-11-25 ENCOUNTER — Telehealth: Payer: Self-pay | Admitting: Family Medicine

## 2023-11-25 NOTE — Telephone Encounter (Deleted)
Copied from CRM [id number]. Topic: Clinical - Medication Refill >> Nov 25, 2023  3:59 PM Isabell A wrote: Most Recent Primary Care Visit:  Provider: Karie Georges  Department: [facility]  Visit Type: OFFICE VISIT  Date: 05/17/2023  Medication: dorzolamidel-timolol (COSOPT) 22.3-6.8 MG/ML SOLN ophthalmic solution  Has the patient contacted their pharmacy? Yes (Agent: If no, request that the patient contact the pharmacy for the refill. If patient does not wish to contact the pharmacy document the reason why and proceed with request.) (Agent: If yes, when and what did the pharmacy advise?)  Is this the correct pharmacy for this prescription? Yes If no, delete pharmacy and type the correct one.  This is the patient's preferred pharmacy:  [id number] [place], [place] - [address] AT [facility] [address] [facility] [address] [place] [address] Phone: [phone] Fax: [phone]   Has the prescription been filled recently? No, not since October   Is the patient out of the medication? Yes  Has the patient been seen for an appointment in the last year OR does the patient have an upcoming appointment? Yes  Can we respond through MyChart? No  Agent: Please be advised that Rx refills may take up to 3 business days. We ask that you follow-up with your pharmacy.

## 2023-11-26 ENCOUNTER — Other Ambulatory Visit: Payer: Self-pay | Admitting: Family Medicine

## 2023-11-28 ENCOUNTER — Encounter: Payer: Medicare HMO | Admitting: Physical Medicine and Rehabilitation

## 2023-12-06 ENCOUNTER — Encounter: Payer: Self-pay | Admitting: Physical Medicine and Rehabilitation

## 2023-12-06 ENCOUNTER — Ambulatory Visit (INDEPENDENT_AMBULATORY_CARE_PROVIDER_SITE_OTHER): Payer: Medicare HMO | Admitting: Physical Medicine and Rehabilitation

## 2023-12-06 DIAGNOSIS — M545 Low back pain, unspecified: Secondary | ICD-10-CM | POA: Diagnosis not present

## 2023-12-06 DIAGNOSIS — M546 Pain in thoracic spine: Secondary | ICD-10-CM | POA: Diagnosis not present

## 2023-12-06 DIAGNOSIS — M47816 Spondylosis without myelopathy or radiculopathy, lumbar region: Secondary | ICD-10-CM

## 2023-12-06 DIAGNOSIS — N1831 Chronic kidney disease, stage 3a: Secondary | ICD-10-CM | POA: Diagnosis not present

## 2023-12-06 DIAGNOSIS — M7918 Myalgia, other site: Secondary | ICD-10-CM | POA: Diagnosis not present

## 2023-12-06 DIAGNOSIS — G8929 Other chronic pain: Secondary | ICD-10-CM

## 2023-12-06 NOTE — Progress Notes (Signed)
 Danielle Gaines - 83 y.o. female MRN [id number]  Date of birth: [birth year]  Office Visit Note: Visit Date: 12/06/2023 PCP: Ozell Heron HERO, MD Referred by: Ozell Heron HERO, MD  Subjective: Chief Complaint  Patient presents with   Lower Back - Pain   HPI: Danielle Gaines is a 83 y.o. female who comes in today as a self referral for evaluation of chronic, worsening and severe pain to bilateral thoracic back radiating down to bilateral lower back and intermittently down right anterolateral leg to foot. Also reports numbness/tingling to right arm and both feet. She was previously managed by Dr. Maude Right Bilateral lower back pain seems to be most painful. Symptoms ongoing for about 3 weeks, worsens with movement/activity and walking. She describes pain as constant sharp sensation, currently rates as 8 out of 10. States her pain becomes so severe that she is not able to ride in the car, hitting bumps in the car seems to cause pain all over her body. Some relief of pain with home exercise regimen, heating pad, rest and use of medications. Some short term relief with Tylenol  and Ibuprofen. No history of formal physical therapy. She was last seen in our office in June of 2024 for right intra-articular hip injection that provided minimal short term relief of pain. She does use cane intermittently to assist with ambulation. Patient denies focal weakness. No recent trauma or falls. No bowel/bladder incontinence.   Of note, she does have history of depression and anxiety.      Review of Systems  Musculoskeletal:  Positive for back pain and myalgias.  Neurological:  Positive for tingling. Negative for weakness.  All other systems reviewed and are negative.  Otherwise per HPI.  Assessment & Plan: Visit Diagnoses:    ICD-10-CM   1. Chronic bilateral low back pain without sciatica  M54.50 Ambulatory referral to Physical Medicine Rehab   G89.29     2. Facet arthropathy, lumbar  M47.816  Ambulatory referral to Physical Medicine Rehab    3. Chronic bilateral thoracic back pain  M54.6    G89.29     4. Myofascial pain syndrome  M79.18        Plan: Findings:  Chronic, worsening and severe pain to bilateral thoracic back radiating down to bilateral lower back and intermittently down right anterolateral leg to foot. Paresthesias to right arm and both feet. Bilateral lower back pain is biggest pain generator at this time. Patients clinical presentation and exam are most consistent with facet mediated pain. There is moderate facet changes at L5-S1. I do think referral of pain to upper back is more myofascial in nature. Intermittent pain radiating down leg is consistent with facet joint syndrome. Next step is to perform diagnostic and hopefully therapeutic bilateral L5-S1 facet joint injections under fluoroscopic guidance. If good relief of pain with facet joint injections we did possible longer sustained pain relief with radiofrequency ablation. I discussed injection procedure with patient today in detail, she has no questions at this time. I encouraged patient to remain active, she can continue with Tylenol  500 mg up to three times a day. No red flag symptoms noted upon exam today.     Meds & Orders: No orders of the defined types were placed in this encounter.   Orders Placed This Encounter  Procedures   Ambulatory referral to Physical Medicine Rehab    Follow-up: Return for Bilateral L5-S1 facet joint injections.   Procedures: No procedures performed      Clinical  History: CLINICAL DATA:  Low back pain, > 6 wks. Bilateral hip pain   EXAM: MRI LUMBAR SPINE WITHOUT CONTRAST   TECHNIQUE: Multiplanar, multisequence MR imaging of the lumbar spine was performed. No intravenous contrast was administered.   COMPARISON:  None.   FINDINGS: Segmentation:  Standard.   Alignment:  CT small retrolisthesis of L1 over L2 and L2 over L3.   Vertebrae:  No fracture, evidence of  discitis, or bone lesion.   Conus medullaris and cauda equina: Conus extends to the L1 level. Conus and cauda equina appear normal.   Paraspinal and other soft tissues: Negative.   Disc levels:   T12-L1: No spinal canal or neural foraminal stenosis.   L1-2: Disc bulge and mild facet degenerative changes resulting in mild bilateral neural foraminal narrowing. No significant spinal canal stenosis.   L2-3: Disc bulge and mild facet degenerative changes resulting in mild bilateral foraminal narrowing. No spinal canal or neural foraminal stenosis.   L3-4: Disc bulge and mild facet degenerative changes without significant spinal canal or neural foraminal stenosis.   L4-5: Disc bulge and mild facet degenerative changes without significant spinal canal or neural foraminal stenosis.   L5-S1: Moderate right and mild left facet degenerative changes. No spinal canal or neural foraminal stenosis.   IMPRESSION: Mild degenerative changes of the lumbar spine with mild bilateral neural foraminal narrowing at L1-2 and L2-3. No significant spinal canal stenosis at any level.     Electronically Signed   By: Katyucia  de Macedo Rodrigues M.D.   On: 07/10/2021 14:54   She reports that she has never smoked. She has never used smokeless tobacco. No results for input(s): HGBA1C, LABURIC in the last 8760 hours.  Objective:  VS:  HT:    WT:   BMI:     BP:   HR: bpm  TEMP: ( )  RESP:  Physical Exam Vitals and nursing note reviewed.  HENT:     Head: Normocephalic and atraumatic.     Right Ear: External ear normal.     Left Ear: External ear normal.     Nose: Nose normal.     Mouth/Throat:     Mouth: Mucous membranes are moist.  Eyes:     Extraocular Movements: Extraocular movements intact.  Cardiovascular:     Rate and Rhythm: Normal rate.     Pulses: Normal pulses.  Pulmonary:     Effort: Pulmonary effort is normal.  Abdominal:     General: Abdomen is flat. There is no  distension.  Musculoskeletal:        General: Tenderness present.     Cervical back: Normal range of motion.     Comments: Patient rises from seated position to standing without difficulty. Pain noted with lumbar extension. 5/5 strength noted with bilateral hip flexion, knee flexion/extension, ankle dorsiflexion/plantarflexion and EHL. No clonus noted bilaterally. No pain upon palpation of greater trochanters. No pain with internal/external rotation of bilateral hips. Sensation intact bilaterally. Negative slump test bilaterally. Gait slow and unsteady.     Skin:    General: Skin is warm and dry.     Capillary Refill: Capillary refill takes less than 2 seconds.  Neurological:     Mental Status: She is alert and oriented to person, place, and time.     Gait: Gait abnormal.  Psychiatric:        Mood and Affect: Mood normal.        Behavior: Behavior normal.     Ortho Exam  Imaging:  No results found.  Past Medical/Family/Surgical/Social History: Medications & Allergies reviewed per EMR, new medications updated. 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   Contact dermatitis 09/10/2021   Hand eczema 09/10/2021   Elective procedure for unacceptable cosmetic appearance 09/10/2021   Lipoma 09/10/2021   Peripheral venous insufficiency 09/10/2021   Pruritus, unspecified 09/10/2021   Low back pain 07/16/2021   Hip pain, bilateral 06/11/2021   Myalgia 04/08/2021   Vasomotor rhinitis 11/04/2020   Referred otalgia of both ears 07/08/2020   Right ear pain 05/09/2020   Bilateral impacted cerumen 09/13/2019   Excessive sweating 06/13/2019   Anterior epistaxis 08/14/2018   Aphthous ulcer 08/14/2018   Dysfunction of both eustachian tubes 08/14/2018   Recurrent epistaxis 08/14/2018   Chronic kidney disease (CKD), stage III (moderate) (HCC) 08/01/2018   Anemia, chronic renal failure 08/01/2018   Episcleritis of left eye 02/23/2018   New  daily persistent headache 01/10/2018   Temporal arteritis (HCC) 01/10/2018   Bilateral temporomandibular joint pain 01/10/2018   Constipation 10/18/2017   Sensorineural hearing loss (SNHL), bilateral 09/04/2017   Hearing loss 09/01/2017   Tremor of both hands 09/01/2017   Dizziness 09/01/2017   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 Vincente   Diastolic heart failure (HCC)    mild on echo 2016 no cardiologist   GERD (gastroesophageal reflux disease)    IBS (irritable bowel syndrome)    sees Dr.  Luis   Macular degeneration    goes to [facility] this   Migraine    managed by neurologist   Osteoporosis    Sty    right eye   Uvulitis    Vertigo    seeing  dr bates for lov 08-11-2021 epic   Wears glasses    for reading   Wears hearing aid in both ears    Family History  Problem Relation Age of Onset   Kidney disease Mother    Heart disease Mother    Osteoporosis Mother  Heart Problems Sister        pacemaker   Cancer Maternal Aunt        intestinal and liver cancer   Stroke Maternal Grandmother    Heart disease Maternal Grandmother    Stroke Other    Depression Other        everyone   Hypercalcemia Neg Hx    Breast cancer Neg Hx    Past Surgical History:  Procedure Laterality Date   ABDOMINAL ADHESION SURGERY     ABDOMINAL HYSTERECTOMY  1972   APPENDECTOMY     CHOLECYSTECTOMY  1996   colonscopy  1989   CYSTOSCOPY W/ URETERAL STENT PLACEMENT Right 04/27/2016   Procedure: CYSTOSCOPY WITH RETROGRADE PYELOGRAM/ RIGHT URETERAL STENT PLACEMENT;  Surgeon: Morene LELON Salines, MD;  Location: [facility];  Service: Urology;  Laterality: Right;   CYSTOSCOPY WITH RETROGRADE PYELOGRAM, URETEROSCOPY AND STENT PLACEMENT Left 10/09/2021   Procedure: CYSTOSCOPY WITH LEFT RETROGRADE URETEROSCOPY WITH HOLMIUM LASER AND STENT PLACEMENT;  Surgeon: Watt Rush, MD;  Location[facility];  Service: Urology;  Laterality: Left;   ESOPHAGOGASTRODUODENOSCOPY     2002, 2011, 2014, 2021   EXTRACORPOREAL SHOCK WAVE LITHOTRIPSY Left 08/31/2021   Procedure: LEFT EXTRACORPOREAL SHOCK WAVE LITHOTRIPSY (ESWL);  Surgeon: Salines Morene LELON, MD;  Location: [facility];  Service: Urology;  Laterality: Left;   FLEXIBLE SIGMOIDOSCOPY     1991, 1994, 2006, 2011, 2021   HOLMIUM LASER APPLICATION Left 10/09/2021   Procedure: HOLMIUM LASER APPLICATION;  Surgeon: Watt Rush, MD;  Location: [facility];  Service: Urology;  Laterality: Left;   lasix  eye  surgery     ou   MEMBRANECTOMY Left 09/21/2013   OVARIAN CYST SURGERY     PARS PLANA VITRECTOMY Left 09/21/2013   SMALL INTESTINE SURGERY     SUBTOTAL COLECTOMY     1991, 1994, 2006, 2011   tumor removal right shoulder  06/22/2021   Social History   Occupational History   Not on file  Tobacco Use   Smoking status: Never   Smokeless tobacco: Never  Vaping Use   Vaping status: Never Used  Substance and Sexual Activity   Alcohol use: No   Drug use: No   Sexual activity: Not Currently    Birth control/protection: Post-menopausal

## 2023-12-13 ENCOUNTER — Ambulatory Visit: Payer: Medicare HMO | Admitting: Family Medicine

## 2023-12-21 DIAGNOSIS — H353221 Exudative age-related macular degeneration, left eye, with active choroidal neovascularization: Secondary | ICD-10-CM | POA: Diagnosis not present

## 2023-12-26 ENCOUNTER — Encounter: Payer: Medicare HMO | Admitting: Physical Medicine and Rehabilitation

## 2023-12-27 ENCOUNTER — Ambulatory Visit (INDEPENDENT_AMBULATORY_CARE_PROVIDER_SITE_OTHER): Payer: Medicare HMO | Admitting: Family Medicine

## 2023-12-27 ENCOUNTER — Encounter: Payer: Self-pay | Admitting: Family Medicine

## 2023-12-27 VITALS — BP 128/68 | HR 75 | Temp 98.1°F | Ht 61.0 in | Wt 144.5 lb

## 2023-12-27 DIAGNOSIS — G2581 Restless legs syndrome: Secondary | ICD-10-CM | POA: Diagnosis not present

## 2023-12-27 DIAGNOSIS — R110 Nausea: Secondary | ICD-10-CM | POA: Diagnosis not present

## 2023-12-27 DIAGNOSIS — R42 Dizziness and giddiness: Secondary | ICD-10-CM | POA: Diagnosis not present

## 2023-12-27 DIAGNOSIS — R202 Paresthesia of skin: Secondary | ICD-10-CM

## 2023-12-27 DIAGNOSIS — Z23 Encounter for immunization: Secondary | ICD-10-CM

## 2023-12-27 MED ORDER — PROMETHAZINE HCL 12.5 MG PO TABS: 6.2500 mg | ORAL_TABLET | Freq: Three times a day (TID) | ORAL | 3 refills | Status: DC | PRN

## 2023-12-27 NOTE — Patient Instructions (Addendum)
OK to stop the amlodipine, I would continue to check blood pressure at home for the next 2 week if your blood pressure is < 140/90 then ok to stay off the amlodipine.  If that does not resolve the dizziness, then ok to stop the atorvastatin, wait 2 weeks, then see if the dizziness has resolved.   Call me to let me know if stopping these medications improves your symptoms.  If it does not then I will send you to ENT for vestibular testing.  If your labs are normal then I will order EMG test

## 2023-12-27 NOTE — Progress Notes (Unsigned)
Established Patient Office Visit  Subjective   Patient ID: Danielle Gaines, female    DOB: 30-Sep-1941  Age: 83 y.o. MRN: [id number]  Chief Complaint  Patient presents with   Medical Management of Chronic Issues   Dizziness    Recurrent dizziness noted, states Meclizine provides some relief, questioned if related to vision problems from macular degeneration   Nausea    Patient complains of recurrent nausea, tried Zofran with no relief    Pt is reporting that when she gets up to walk she feels like she is going to faint. States that it has been going on for "quite a while" went to see her cardiologist and he increased her atorvastatin 40 mg daily and he added amlodipine 2.5 mg daily. She is not sure if the amlpdipine if making it worse or not. She also reports some room spinning if she turns her head too fast, but mostly is reporting postural dizziness.   Pt also reports that the ondansetron she is using for her chronic nausea is also not working, states that she did see the GI doctor who continued the PPI treatment.     Current Outpatient Medications  Medication Instructions   acetaminophen (TYLENOL) 1,000 mg, Daily PRN   amLODipine (NORVASC) 2.5 mg, Oral, Daily   Apoaequorin (PREVAGEN PO) Daily   atorvastatin (LIPITOR) 40 mg, Oral, Daily   brimonidine (ALPHAGAN P) 0.1 % SOLN Apply to eye.   CREON 36000-114000 units CPEP capsule 36,000 Units, 3 times daily   cycloSPORINE (RESTASIS) 0.05 % ophthalmic emulsion 1 drop, 2 times daily   denosumab (PROLIA) 60 mg, Every 6 months   Dorzolamide HCl-Timolol Mal PF 2-0.5 % SOLN 1 drop, Both Eyes, 2 times daily   folic acid (FOLVITE) 1 MG tablet Take by mouth.   furosemide (LASIX) 40 mg, Daily   Linzess 290 mcg, Daily before breakfast   LORazepam (ATIVAN) 0.5 mg, 3 times daily   meclizine (ANTIVERT) 25 MG tablet TAKE 1 TABLET(25 MG) BY MOUTH THREE TIMES DAILY AS NEEDED FOR DIZZINESS   neomycin-polymyxin b-dexamethasone (MAXITROL)  3.5-10000-0.1 OINT    olopatadine (PATANOL) 0.1 % ophthalmic solution 1 drop, 2 times daily   omeprazole (PRILOSEC) 40 mg, Daily   Probiotic Product (ALIGN PO) Daily   promethazine (PHENERGAN) 6.25-12.5 mg, Oral, Every 8 hours PRN   Qulipta 60 mg, Daily   tamsulosin (FLOMAX) 0.4 mg, Daily at bedtime   Travoprost, BAK Free, (TRAVATAN) 0.004 % SOLN ophthalmic solution 1 drop, Daily at bedtime   UBRELVY 100 MG TABS No dose, route, or frequency recorded.    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   Contact dermatitis 09/10/2021   Hand eczema 09/10/2021   Elective procedure for unacceptable cosmetic appearance 09/10/2021   Lipoma 09/10/2021   Peripheral venous insufficiency 09/10/2021   Pruritus, unspecified 09/10/2021   Low back pain 07/16/2021   Hip pain, bilateral 06/11/2021   Myalgia 04/08/2021   Vasomotor rhinitis 11/04/2020   Referred otalgia of both ears 07/08/2020   Right ear pain 05/09/2020   Bilateral impacted cerumen 09/13/2019   Excessive sweating 06/13/2019   Anterior epistaxis 08/14/2018   Aphthous ulcer 08/14/2018   Dysfunction of both eustachian tubes 08/14/2018   Recurrent epistaxis 08/14/2018   Chronic kidney disease (CKD), stage III (moderate) (HCC) 08/01/2018   Anemia, chronic renal failure 08/01/2018   Episcleritis of left eye 02/23/2018  New daily persistent headache 01/10/2018   Temporal arteritis (HCC) 01/10/2018   Bilateral temporomandibular joint pain 01/10/2018   Constipation 10/18/2017   Sensorineural hearing loss (SNHL), bilateral 09/04/2017   Hearing loss 09/01/2017   Tremor of both hands 09/01/2017   Postural dizziness 09/01/2017   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     Objective:     BP 128/68   Pulse 75   Temp 98.1 F (36.7 C) (Oral)   Ht 5\' 1"  (1.549 m)   Wt 144 lb 8 oz (65.5 kg)   SpO2 98%   BMI 27.30 kg/m    Physical Exam Vitals reviewed.  Constitutional:      Appearance: Normal appearance. She is normal weight.  Cardiovascular:     Rate and Rhythm: Normal rate and regular rhythm.     Pulses: Normal pulses.     Heart sounds: Normal heart sounds. No murmur heard. Pulmonary:     Effort: Pulmonary effort is normal.     Breath sounds: Normal breath sounds. No wheezing.  Neurological:     Mental Status: She is alert and oriented to person, place, and time. Mental status is at baseline.  Psychiatric:        Mood and Affect: Mood normal.        Behavior: Behavior normal.       The ASCVD Risk score (Arnett DK, et al., 2019) failed to calculate for the following reasons:   The 2019 ASCVD risk score is only valid for ages 26 to 27    Assessment & Plan:  Paresthesia of arm Pt also reporting numbness and tingling in her right arm, will check B12  -     Vitamin B12  Restless  leg syndrome Pt is reporting this as a condition, will check iron panel, likely due to her CKD stage 3  -     Iron, TIBC and Ferritin Panel  Need for influenza vaccination -     Flu Vaccine Trivalent High Dose (Fluad)  Chronic nausea Assessment & Plan: Failed zofran, will rx promethazine  Orders: -     Promethazine HCl; Take 0.5-1 tablets (6.25-12.5 mg total) by mouth every 8 (eight) hours as needed for nausea or vomiting.  Dispense: 30 tablet; Refill: 3  Postural dizziness Assessment & Plan: Chronic, ongoing, her symptoms today remind me more of orthostatic hypotension, perhaps worsened by the amlodipine the cardiologist put her on, he also increased her atorvastatin to 40 mg daily at the same time. I reviewed his notes  and her most recent CTA and ECHO, I advised that perhaps the dizziness is medication related, I recommended she stop the amlodipine and continue to check her BP at home for the next 2 weeks, if that does not improve her dizziness then to stop the atorvastatin for 2 weeks to see if this is causing it. She was instructed to notify me of the results.       Return in about 6 months (around 06/25/2024).    Karie Georges, MD

## 2023-12-28 ENCOUNTER — Telehealth: Payer: Self-pay | Admitting: Family Medicine

## 2023-12-28 DIAGNOSIS — I959 Hypotension, unspecified: Secondary | ICD-10-CM | POA: Diagnosis not present

## 2023-12-28 DIAGNOSIS — N1831 Chronic kidney disease, stage 3a: Secondary | ICD-10-CM | POA: Diagnosis not present

## 2023-12-28 DIAGNOSIS — H353231 Exudative age-related macular degeneration, bilateral, with active choroidal neovascularization: Secondary | ICD-10-CM | POA: Diagnosis not present

## 2023-12-28 DIAGNOSIS — I951 Orthostatic hypotension: Secondary | ICD-10-CM | POA: Diagnosis not present

## 2023-12-28 DIAGNOSIS — R42 Dizziness and giddiness: Secondary | ICD-10-CM

## 2023-12-28 DIAGNOSIS — H401133 Primary open-angle glaucoma, bilateral, severe stage: Secondary | ICD-10-CM | POA: Diagnosis not present

## 2023-12-28 DIAGNOSIS — N189 Chronic kidney disease, unspecified: Secondary | ICD-10-CM | POA: Diagnosis not present

## 2023-12-28 DIAGNOSIS — H04123 Dry eye syndrome of bilateral lacrimal glands: Secondary | ICD-10-CM | POA: Diagnosis not present

## 2023-12-28 DIAGNOSIS — D631 Anemia in chronic kidney disease: Secondary | ICD-10-CM | POA: Diagnosis not present

## 2023-12-28 DIAGNOSIS — R110 Nausea: Secondary | ICD-10-CM | POA: Insufficient documentation

## 2023-12-28 DIAGNOSIS — N2581 Secondary hyperparathyroidism of renal origin: Secondary | ICD-10-CM | POA: Diagnosis not present

## 2023-12-28 DIAGNOSIS — G2581 Restless legs syndrome: Secondary | ICD-10-CM | POA: Insufficient documentation

## 2023-12-28 LAB — IRON,TIBC AND FERRITIN PANEL
%SAT: 24 % (ref 16–45)
Ferritin: 422 ng/mL — ABNORMAL HIGH (ref 16–288)
Iron: 73 ug/dL (ref 45–160)
TIBC: 298 ug/dL (ref 250–450)

## 2023-12-28 LAB — VITAMIN B12: Vitamin B-12: 350 pg/mL (ref 211–911)

## 2023-12-28 NOTE — Assessment & Plan Note (Signed)
Chronic, ongoing, her symptoms today remind me more of orthostatic hypotension, perhaps worsened by the amlodipine the cardiologist put her on, he also increased her atorvastatin to 40 mg daily at the same time. I reviewed his notes and her most recent CTA and ECHO, I advised that perhaps the dizziness is medication related, I recommended she stop the amlodipine and continue to check her BP at home for the next 2 weeks, if that does not improve her dizziness then to stop the atorvastatin for 2 weeks to see if this is causing it. She was instructed to notify me of the results.

## 2023-12-28 NOTE — Telephone Encounter (Signed)
Patient spouse Danielle Gaines came in asking for a referral for ENT for patient Danielle Gaines with Dr Aquilla [facility] [phone]  Spouse would also like a call on lab results.  Please advise at [phone]

## 2023-12-28 NOTE — Assessment & Plan Note (Signed)
Failed zofran, will rx promethazine

## 2023-12-29 NOTE — Telephone Encounter (Signed)
Left a detailed message with the test results below at the patient's cell number.  Message also left stating the referral was entered and someone will contact the patient with appt info.

## 2023-12-29 NOTE — Telephone Encounter (Signed)
B12 and iron levels are normal, ok to place referral to ENT for chronic vertigo

## 2024-01-03 ENCOUNTER — Other Ambulatory Visit: Payer: Self-pay

## 2024-01-03 ENCOUNTER — Ambulatory Visit (INDEPENDENT_AMBULATORY_CARE_PROVIDER_SITE_OTHER): Payer: Medicare HMO | Admitting: Physical Medicine and Rehabilitation

## 2024-01-03 DIAGNOSIS — M47816 Spondylosis without myelopathy or radiculopathy, lumbar region: Secondary | ICD-10-CM | POA: Diagnosis not present

## 2024-01-03 MED ORDER — METHYLPREDNISOLONE ACETATE 40 MG/ML IJ SUSP
40.0000 mg | Freq: Once | INTRAMUSCULAR | Status: AC
  Administered 2024-01-03: 40 mg

## 2024-01-03 NOTE — Progress Notes (Signed)
 Functional Pain Scale - descriptive words and definitions  Mild (2)   Noticeable when not distracted/no impact on ADL's/sleep only slightly affected and able to   use both passive and active distraction for comfort. Mild range order  Average Pain 4  R>L  Pain is worse when she is up and moving around.  +Driver, -BT, -Dye Allergies.

## 2024-01-12 NOTE — Progress Notes (Signed)
 Danielle Gaines - 83 y.o. female MRN [id number]  Date of birth: 1941-05-14  Office Visit Note: Visit Date: 01/03/2024 PCP: Ozell Heron HERO, MD Referred by: Ozell Heron HERO, MD  Subjective: Chief Complaint  Patient presents with   Lower Back - Pain   HPI:  Danielle Gaines is a 83 y.o. female who comes in today at the request of Duwaine Pouch, FNP for planned Bilateral  L5-S1 Lumbar facet/medial branch block with fluoroscopic guidance.  The patient has failed conservative care including home exercise, medications, time and activity modification.  This injection will be diagnostic and hopefully therapeutic.  Please see requesting physician notes for further details and justification.  Exam has shown concordant pain with facet joint loading.   ROS Otherwise per HPI.  Assessment & Plan: Visit Diagnoses:    ICD-10-CM   1. Spondylosis without myelopathy or radiculopathy, lumbar region  M47.816 XR C-ARM NO REPORT    Facet Injection    methylPREDNISolone  acetate (DEPO-MEDROL ) injection 40 mg      Plan: No additional findings.   Meds & Orders:  Meds ordered this encounter  Medications   methylPREDNISolone  acetate (DEPO-MEDROL ) injection 40 mg    Orders Placed This Encounter  Procedures   Facet Injection   XR C-ARM NO REPORT    Follow-up: Return for visit to requesting provider as needed.   Procedures: No procedures performed  Lumbar Facet Joint Intra-Articular Injection(s) with Fluoroscopic Guidance  Patient: Danielle Gaines      Date of Birth: [birth year] MRN: [id number] PCP: Ozell Heron HERO, MD      Visit Date: 01/03/2024   Universal Protocol:    Date/Time: 01/03/2024  Consent Given By: the patient  Position: PRONE   Additional Comments: Vital signs were monitored before and after the procedure. Patient was prepped and draped in the usual sterile fashion. The correct patient, procedure, and site was verified.   Injection Procedure Details:  Procedure Site  One Meds Administered:  Meds ordered this encounter  Medications   methylPREDNISolone  acetate (DEPO-MEDROL ) injection 40 mg     Laterality: Bilateral  Location/Site:  L5-S1  Needle size: 22 guage  Needle type: Spinal  Needle Placement: Articular  Findings:  -Comments: Excellent flow of contrast producing a partial arthrogram.  Procedure Details: The fluoroscope beam is vertically oriented in AP, and the inferior recess is visualized beneath the lower pole of the inferior apophyseal process, which represents the target point for needle insertion. When direct visualization is difficult the target point is located at the medial projection of the vertebral pedicle. The region overlying each aforementioned target is locally anesthetized with a 1 to 2 ml. volume of 1% Lidocaine  without Epinephrine.   The spinal needle was inserted into each of the above mentioned facet joints using biplanar fluoroscopic guidance. A 0.25 to 0.5 ml. volume of Isovue -250 was injected and a partial facet joint arthrogram was obtained. A single spot film was obtained of the resulting arthrogram.    One to 1.25 ml of the steroid/anesthetic solution was then injected into each of the facet joints noted above.   Additional Comments:  The patient tolerated the procedure well Dressing: 2 x 2 sterile gauze and Band-Aid    Post-procedure details: Patient was observed during the procedure. Post-procedure instructions were reviewed.  Patient left the clinic in stable condition.    Clinical History: CLINICAL DATA:  Low back pain, > 6 wks. Bilateral hip pain   EXAM: MRI LUMBAR SPINE WITHOUT CONTRAST   TECHNIQUE:  Multiplanar, multisequence MR imaging of the lumbar spine was performed. No intravenous contrast was administered.   COMPARISON:  None.   FINDINGS: Segmentation:  Standard.   Alignment:  CT small retrolisthesis of L1 over L2 and L2 over L3.   Vertebrae:  No fracture, evidence of discitis, or  bone lesion.   Conus medullaris and cauda equina: Conus extends to the L1 level. Conus and cauda equina appear normal.   Paraspinal and other soft tissues: Negative.   Disc levels:   T12-L1: No spinal canal or neural foraminal stenosis.   L1-2: Disc bulge and mild facet degenerative changes resulting in mild bilateral neural foraminal narrowing. No significant spinal canal stenosis.   L2-3: Disc bulge and mild facet degenerative changes resulting in mild bilateral foraminal narrowing. No spinal canal or neural foraminal stenosis.   L3-4: Disc bulge and mild facet degenerative changes without significant spinal canal or neural foraminal stenosis.   L4-5: Disc bulge and mild facet degenerative changes without significant spinal canal or neural foraminal stenosis.   L5-S1: Moderate right and mild left facet degenerative changes. No spinal canal or neural foraminal stenosis.   IMPRESSION: Mild degenerative changes of the lumbar spine with mild bilateral neural foraminal narrowing at L1-2 and L2-3. No significant spinal canal stenosis at any level.     Electronically Signed   By: Katyucia  de Macedo Rodrigues M.D.   On: 07/10/2021 14:54     Objective:  VS:  HT:    WT:   BMI:     BP:   HR: bpm  TEMP: ( )  RESP:  Physical Exam Vitals and nursing note reviewed.  Constitutional:      General: She is not in acute distress.    Appearance: Normal appearance. She is not ill-appearing.  HENT:     Head: Normocephalic and atraumatic.     Right Ear: External ear normal.     Left Ear: External ear normal.  Eyes:     Extraocular Movements: Extraocular movements intact.  Cardiovascular:     Rate and Rhythm: Normal rate.     Pulses: Normal pulses.  Pulmonary:     Effort: Pulmonary effort is normal. No respiratory distress.  Abdominal:     General: There is no distension.     Palpations: Abdomen is soft.  Musculoskeletal:        General: Tenderness present.     Cervical  back: Neck supple.     Right lower leg: No edema.     Left lower leg: No edema.     Comments: Patient has good distal strength with no pain over the greater trochanters.  No clonus or focal weakness.  Skin:    Findings: No erythema, lesion or rash.  Neurological:     General: No focal deficit present.     Mental Status: She is alert and oriented to person, place, and time.     Sensory: No sensory deficit.     Motor: No weakness or abnormal muscle tone.     Coordination: Coordination normal.  Psychiatric:        Mood and Affect: Mood normal.        Behavior: Behavior normal.      Imaging: No results found.

## 2024-01-12 NOTE — Procedures (Signed)
 Lumbar Facet Joint Intra-Articular Injection(s) with Fluoroscopic Guidance  Patient: Danielle Gaines      Date of Birth: 03-02-1941 MRN: [id number] PCP: Ozell Heron HERO, MD      Visit Date: 01/03/2024   Universal Protocol:    Date/Time: 01/03/2024  Consent Given By: the patient  Position: PRONE   Additional Comments: Vital signs were monitored before and after the procedure. Patient was prepped and draped in the usual sterile fashion. The correct patient, procedure, and site was verified.   Injection Procedure Details:  Procedure Site One Meds Administered:  Meds ordered this encounter  Medications   methylPREDNISolone  acetate (DEPO-MEDROL ) injection 40 mg     Laterality: Bilateral  Location/Site:  L5-S1  Needle size: 22 guage  Needle type: Spinal  Needle Placement: Articular  Findings:  -Comments: Excellent flow of contrast producing a partial arthrogram.  Procedure Details: The fluoroscope beam is vertically oriented in AP, and the inferior recess is visualized beneath the lower pole of the inferior apophyseal process, which represents the target point for needle insertion. When direct visualization is difficult the target point is located at the medial projection of the vertebral pedicle. The region overlying each aforementioned target is locally anesthetized with a 1 to 2 ml. volume of 1% Lidocaine  without Epinephrine.   The spinal needle was inserted into each of the above mentioned facet joints using biplanar fluoroscopic guidance. A 0.25 to 0.5 ml. volume of Isovue -250 was injected and a partial facet joint arthrogram was obtained. A single spot film was obtained of the resulting arthrogram.    One to 1.25 ml of the steroid/anesthetic solution was then injected into each of the facet joints noted above.   Additional Comments:  The patient tolerated the procedure well Dressing: 2 x 2 sterile gauze and Band-Aid    Post-procedure details: Patient was observed  during the procedure. Post-procedure instructions were reviewed.  Patient left the clinic in stable condition.

## 2024-01-22 NOTE — Telephone Encounter (Signed)
 Prolia VOB initiated via [email or web address]  Next Prolia inj DUE: re-verify for 2025

## 2024-01-26 ENCOUNTER — Other Ambulatory Visit: Payer: Self-pay | Admitting: Physical Medicine and Rehabilitation

## 2024-01-26 MED ORDER — HYDROCODONE-ACETAMINOPHEN 5-325 MG PO TABS: 1.0000 | ORAL_TABLET | Freq: Three times a day (TID) | ORAL | 0 refills | Status: DC | PRN

## 2024-01-28 NOTE — Telephone Encounter (Signed)
 Patient is ready for scheduling on or after 11/30/23 BUY AND BILL  Out-of-pocket cost due at time of visit: $356.87  Primary: [employer] Adv Gold Plus Prolia co-insurance: 20% (approximately $331.87) Admin fee co-insurance: 20% (approximately $25)  Deductible: does not apply  Prior Auth: APPROVED PA# [id number] Valid:05/16/20-11/28/24  Secondary: N/A Prolia co-insurance:  Admin fee co-insurance:  Deductible:  Prior Auth:  PA# Valid:   ** This summary of benefits is an estimation of the patient's out-of-pocket cost. Exact cost may vary based on individual plan coverage.

## 2024-01-28 NOTE — Telephone Encounter (Signed)
 Medical Buy and [employer] [facility] - Prior Authorization REQUIRED

## 2024-01-28 NOTE — Telephone Encounter (Signed)
 Prior Authorization for [facility] APPROVED PA# [id number] Valid:05/16/20-11/28/24

## 2024-02-01 DIAGNOSIS — H353231 Exudative age-related macular degeneration, bilateral, with active choroidal neovascularization: Secondary | ICD-10-CM | POA: Diagnosis not present

## 2024-02-02 ENCOUNTER — Other Ambulatory Visit: Payer: Self-pay | Admitting: Family Medicine

## 2024-02-02 ENCOUNTER — Encounter: Payer: Self-pay | Admitting: Physical Medicine and Rehabilitation

## 2024-02-02 ENCOUNTER — Ambulatory Visit (INDEPENDENT_AMBULATORY_CARE_PROVIDER_SITE_OTHER): Payer: Medicare HMO | Admitting: Physical Medicine and Rehabilitation

## 2024-02-02 VITALS — BP 137/76 | HR 76

## 2024-02-02 DIAGNOSIS — M47816 Spondylosis without myelopathy or radiculopathy, lumbar region: Secondary | ICD-10-CM | POA: Diagnosis not present

## 2024-02-02 DIAGNOSIS — G8929 Other chronic pain: Secondary | ICD-10-CM

## 2024-02-02 DIAGNOSIS — M5441 Lumbago with sciatica, right side: Secondary | ICD-10-CM

## 2024-02-02 DIAGNOSIS — M546 Pain in thoracic spine: Secondary | ICD-10-CM | POA: Diagnosis not present

## 2024-02-02 DIAGNOSIS — M7918 Myalgia, other site: Secondary | ICD-10-CM | POA: Diagnosis not present

## 2024-02-02 DIAGNOSIS — E782 Mixed hyperlipidemia: Secondary | ICD-10-CM

## 2024-02-02 NOTE — Progress Notes (Signed)
 Danielle Gaines - 83 y.o. female MRN [id number]  Date of birth: 19-Jan-1941  Office Visit Note: Visit Date: 02/02/2024 PCP: Karie Georges, MD Referred by: Karie Georges, MD  Subjective: Chief Complaint  Patient presents with   Lower Back - Pain   HPI: Danielle Gaines is a 83 y.o. female who comes in today for evaluation of chronic, worsening and severe bilateral thoracic back radiating down to bilateral lower back and intermittently down right anterolateral leg to foot. Pain ongoing for several years, worsens with activity and standing. She describes pain as sore and sharp sensation, currently rates as 5 out of 10. Some relief of pain with home exercise regimen, rest and use of medications. Good relief of pain with Ibuprofen. Lumbar MRI from 2022 shows mild degenerative changes of the lumbar spine with mild bilateral neural foraminal narrowing at L1-L2 and L2-L3. No severe spinal canal stenosis. She was previously managed by Dr. Norlene Campbell from orthopedic standpoint. She has undergone multiple right intra-articular hip injections in our office over the years with intermittent relief of pain. More recently she underwent bilateral L5-S1 facet joint injections with no relief of pain. She does reports diffuse pain all over her body that she has struggled with for many years. States she was told by several other doctors that she could have fibromyalgia. Patient denies focal weakness, she reports frequent falls due to pain and vision loss. No bowel/bladder incontinence.      Review of Systems  Musculoskeletal:  Positive for back pain, falls and myalgias.  Neurological:  Negative for tingling, sensory change, focal weakness and weakness.  All other systems reviewed and are negative.  Otherwise per HPI.  Assessment & Plan: Visit Diagnoses:    ICD-10-CM   1. Chronic bilateral thoracic back pain  M54.6 MR LUMBAR SPINE WO CONTRAST   G89.29     2. Chronic bilateral low back pain with  right-sided sciatica  M54.41 MR LUMBAR SPINE WO CONTRAST   G89.29     3. Facet arthropathy, lumbar  M47.816 MR LUMBAR SPINE WO CONTRAST    4. Myofascial pain syndrome  M79.18 MR LUMBAR SPINE WO CONTRAST       Plan: Findings:  Chronic, worsening and severe bilateral thoracic back radiating down to bilateral lower back and intermittently down right anterolateral leg to foot. Her lower back seems to be biggest pain generator. She continues to have severe pain despite good conservative therapies such as home exercise regimen, rest and use of medications. Patients clinical presentation and exam are complex, her symptoms do not directly correlate with lumbar MRI imaging. Recent lumbar facet joint injections were not beneficial in alleviating her pain. I do feel there is a type of central sensitization syndrome such as fibromyalgia working to exacerbate her symptoms. She does have myofascial tenderness to bilateral hips, lumbar/thoracic paraspinal regions and legs upon palpation. Patient completed fibromyalgia screening tool in the office today. Her widespread pain index and symptom severity scores do meet diagnostic criteria for fibromyalgia. We discussed treatment plan in detail today. Patient is requesting new lumbar MRI imaging, I did go ahead and place order. I do feel she would benefit from short course of formal physical therapy, however she declined due to ongoing vision issues and inability to drive. Would recommend patient speak with her primary care provider regarding treatment of fibromyalgia. I will see her back for lumbar MRI review. No red flag symptoms noted upon exam..     Meds & Orders: No orders  of the defined types were placed in this encounter.   Orders Placed This Encounter  Procedures   MR LUMBAR SPINE WO CONTRAST    Follow-up: Return for Lumbar MRI review.   Procedures: No procedures performed      Clinical History: CLINICAL DATA:  Low back pain, > 6 wks. Bilateral hip  pain   EXAM: MRI LUMBAR SPINE WITHOUT CONTRAST   TECHNIQUE: Multiplanar, multisequence MR imaging of the lumbar spine was performed. No intravenous contrast was administered.   COMPARISON:  None.   FINDINGS: Segmentation:  Standard.   Alignment:  CT small retrolisthesis of L1 over L2 and L2 over L3.   Vertebrae:  No fracture, evidence of discitis, or bone lesion.   Conus medullaris and cauda equina: Conus extends to the L1 level. Conus and cauda equina appear normal.   Paraspinal and other soft tissues: Negative.   Disc levels:   T12-L1: No spinal canal or neural foraminal stenosis.   L1-2: Disc bulge and mild facet degenerative changes resulting in mild bilateral neural foraminal narrowing. No significant spinal canal stenosis.   L2-3: Disc bulge and mild facet degenerative changes resulting in mild bilateral foraminal narrowing. No spinal canal or neural foraminal stenosis.   L3-4: Disc bulge and mild facet degenerative changes without significant spinal canal or neural foraminal stenosis.   L4-5: Disc bulge and mild facet degenerative changes without significant spinal canal or neural foraminal stenosis.   L5-S1: Moderate right and mild left facet degenerative changes. No spinal canal or neural foraminal stenosis.   IMPRESSION: Mild degenerative changes of the lumbar spine with mild bilateral neural foraminal narrowing at L1-2 and L2-3. No significant spinal canal stenosis at any level.     Electronically Signed   By: Baldemar Lenis M.D.   On: 07/10/2021 14:54   She reports that she has never smoked. She has never used smokeless tobacco. No results for input(s): "HGBA1C", "LABURIC" in the last 8760 hours.  Objective:  VS:  HT:    WT:   BMI:     BP:137/76  HR:76bpm  TEMP: ( )  RESP:  Physical Exam Vitals and nursing note reviewed.  HENT:     Head: Normocephalic and atraumatic.     Right Ear: External ear normal.     Left Ear:  External ear normal.     Nose: Nose normal.     Mouth/Throat:     Mouth: Mucous membranes are moist.  Eyes:     Extraocular Movements: Extraocular movements intact.  Cardiovascular:     Rate and Rhythm: Normal rate.     Pulses: Normal pulses.  Pulmonary:     Effort: Pulmonary effort is normal.  Abdominal:     General: Abdomen is flat. There is no distension.  Musculoskeletal:        General: Tenderness present.     Cervical back: Normal range of motion.     Comments: Patient is slow to rise from seated position to standing position. Good lumbar range of motion. No pain noted with facet loading. 5/5 strength noted with bilateral hip flexion, knee flexion/extension, ankle dorsiflexion/plantarflexion and EHL. No clonus noted bilaterally. No pain upon palpation of greater trochanters. No pain with internal/external rotation of bilateral hips. Sensation intact bilaterally. Myofascial tenderness noted to bilateral hips, legs, thoracic and lumbar paraspinal regions. Negative slump test bilaterally. Ambulates without aid, gait steady.     Skin:    General: Skin is warm and dry.     Capillary Refill:  Capillary refill takes less than 2 seconds.  Neurological:     General: No focal deficit present.     Mental Status: She is alert and oriented to person, place, and time.  Psychiatric:        Mood and Affect: Mood normal.        Behavior: Behavior normal.     Ortho Exam  Imaging: No results found.  Past Medical/Family/Surgical/Social History: Medications & Allergies reviewed per EMR, new medications updated. 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   Contact dermatitis 09/10/2021   Hand eczema 09/10/2021   Elective procedure for unacceptable cosmetic appearance 09/10/2021   Lipoma 09/10/2021   Peripheral venous insufficiency 09/10/2021   Pruritus, unspecified 09/10/2021    Low back pain 07/16/2021   Hip pain, bilateral 06/11/2021   Myalgia 04/08/2021   Vasomotor rhinitis 11/04/2020   Referred otalgia of both ears 07/08/2020   Right ear pain 05/09/2020   Bilateral impacted cerumen 09/13/2019   Excessive sweating 06/13/2019   Anterior epistaxis 08/14/2018   Aphthous ulcer 08/14/2018   Dysfunction of both eustachian tubes 08/14/2018   Recurrent epistaxis 08/14/2018   Chronic kidney disease (CKD), stage III (moderate) (HCC) 08/01/2018   Anemia, chronic renal failure 08/01/2018   Episcleritis of left eye 02/23/2018   New daily persistent headache 01/10/2018   Temporal arteritis (HCC) 01/10/2018   Bilateral temporomandibular joint pain 01/10/2018   Constipation 10/18/2017   Sensorineural hearing loss (SNHL), bilateral 09/04/2017   Hearing loss 09/01/2017   Tremor of both hands 09/01/2017   Postural dizziness 09/01/2017   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 Evelene Croon   Diastolic heart failure [facility])    mild on echo 2016 no cardiologist   GERD (gastroesophageal reflux disease)    IBS (irritable bowel syndrome)    sees Dr. Kinnie Scales   Macular degeneration    goes to [facility] for this   Migraine  managed by neurologist   Osteoporosis    Sty    right eye   Uvulitis    Vertigo    seeing  dr bates for [facility] 08-11-2021 epic   Wears glasses    for reading   Wears hearing aid in both ears    Family History  Problem Relation Age of Onset   Kidney disease Mother    Heart disease Mother    Osteoporosis Mother    Heart Problems Sister        pacemaker   Cancer Maternal Aunt        intestinal and liver cancer   Stroke Maternal Grandmother    Heart disease Maternal Grandmother    Stroke Other    Depression Other        everyone   Hypercalcemia Neg Hx    Breast cancer Neg Hx    Past Surgical History:  Procedure Laterality Date   ABDOMINAL ADHESION SURGERY     ABDOMINAL HYSTERECTOMY  1972   APPENDECTOMY     CHOLECYSTECTOMY  1996   colonscopy  1989   CYSTOSCOPY W/ URETERAL STENT PLACEMENT Right 04/27/2016   Procedure: CYSTOSCOPY WITH RETROGRADE PYELOGRAM/ RIGHT URETERAL STENT PLACEMENT;  Surgeon: Crist Fat, MD;  Location: [facility];  Service: Urology;  Laterality: Right;   CYSTOSCOPY WITH RETROGRADE PYELOGRAM, URETEROSCOPY AND STENT PLACEMENT Left 10/09/2021   Procedure: CYSTOSCOPY WITH LEFT RETROGRADE URETEROSCOPY WITH HOLMIUM LASER AND STENT PLACEMENT;  Surgeon: Bjorn Pippin, MD;  Location: [facility];  Service:  Urology;  Laterality: Left;   ESOPHAGOGASTRODUODENOSCOPY     2002, 2011, 2014, 2021   EXTRACORPOREAL SHOCK WAVE LITHOTRIPSY Left 08/31/2021   Procedure: LEFT EXTRACORPOREAL SHOCK WAVE LITHOTRIPSY (ESWL);  Surgeon: Crist Fat, MD;  Location: [facility];  Service: Urology;  Laterality: Left;   FLEXIBLE SIGMOIDOSCOPY     1991, 1994, 2006, 2011, 2021   HOLMIUM LASER APPLICATION Left 10/09/2021   Procedure: HOLMIUM LASER APPLICATION;  Surgeon: Bjorn Pippin, MD;  Location: [facility];  Service: Urology;  Laterality: Left;   lasix eye surgery     ou   MEMBRANECTOMY Left 09/21/2013   OVARIAN CYST SURGERY     PARS PLANA VITRECTOMY Left 09/21/2013   SMALL INTESTINE SURGERY     SUBTOTAL COLECTOMY     1991, 1994, 2006, 2011   tumor removal right shoulder  06/22/2021   Social History   Occupational History   Not on file  Tobacco Use   Smoking status: Never   Smokeless tobacco: Never  Vaping Use   Vaping status: Never Used  Substance and Sexual Activity   Alcohol use: No   Drug use: No   Sexual activity: Not Currently    Birth control/protection: Post-menopausal

## 2024-02-02 NOTE — Progress Notes (Signed)
 Pain Scale   Average Pain 5

## 2024-02-02 NOTE — Progress Notes (Signed)
 Core Outcome Measures Index (COMI) Back Score  Average Pain 5  COMI Score 75%

## 2024-02-09 DIAGNOSIS — H6123 Impacted cerumen, bilateral: Secondary | ICD-10-CM | POA: Diagnosis not present

## 2024-02-09 DIAGNOSIS — Z974 Presence of external hearing-aid: Secondary | ICD-10-CM | POA: Diagnosis not present

## 2024-02-14 ENCOUNTER — Encounter: Payer: Self-pay | Admitting: Physical Medicine and Rehabilitation

## 2024-02-17 ENCOUNTER — Ambulatory Visit
Admission: RE | Admit: 2024-02-17 | Discharge: 2024-02-17 | Disposition: A | Discharge status: Home / Self Care | Source: Ambulatory Visit | Attending: Physical Medicine and Rehabilitation | Admitting: Physical Medicine and Rehabilitation

## 2024-02-17 DIAGNOSIS — M47816 Spondylosis without myelopathy or radiculopathy, lumbar region: Secondary | ICD-10-CM | POA: Diagnosis not present

## 2024-02-17 DIAGNOSIS — M7918 Myalgia, other site: Secondary | ICD-10-CM

## 2024-02-17 DIAGNOSIS — M5126 Other intervertebral disc displacement, lumbar region: Secondary | ICD-10-CM | POA: Diagnosis not present

## 2024-02-17 DIAGNOSIS — G8929 Other chronic pain: Secondary | ICD-10-CM

## 2024-02-17 DIAGNOSIS — M48061 Spinal stenosis, lumbar region without neurogenic claudication: Secondary | ICD-10-CM | POA: Diagnosis not present

## 2024-02-22 ENCOUNTER — Ambulatory Visit

## 2024-02-28 NOTE — Telephone Encounter (Addendum)
 Last Prolia  injection was due 04/12/23, at this point patient is > 5 months past due for Prolia  injection.   Please also note that pt has not had OV since 04/08/21 with Dr. Washington Hacker.   Pt needs to schedule for f/u appointment and Prolia  injection.    If you would like for pt to continue with Prolia  therapy, please have clinical staff reach out to pt for scheduling and to explain to importance of receiving Prolia  injections every 6 months as abrupt cessation of Prolia  raises risk of osteoporotic fracture.    "Discontinuation of Dmab is associated with a 3- to 5-fold higher risk for vertebral, major osteoporotic, and hip fractures [38,39]."   [email or web address]

## 2024-02-29 NOTE — Telephone Encounter (Signed)
 LMx1 to schedule office visit and next Prolia injection.

## 2024-03-01 ENCOUNTER — Ambulatory Visit: Payer: Medicare HMO | Admitting: Family Medicine

## 2024-03-01 ENCOUNTER — Encounter: Payer: Self-pay | Admitting: Family Medicine

## 2024-03-01 DIAGNOSIS — Z0000 Encounter for general adult medical examination without abnormal findings: Secondary | ICD-10-CM

## 2024-03-01 NOTE — Progress Notes (Signed)
 PATIENT CHECK-IN and HEALTH RISK ASSESSMENT QUESTIONNAIRE:  -completed by phone/video for upcoming Medicare Preventive Visit  Pre-Visit Check-in: 1)Vitals (height, wt, BP, etc) - record in vitals section for visit on day of visit Request home vitals (wt, BP, etc.) and enter into vitals, THEN update Vital Signs SmartPhrase below at the top of the HPI. See below.  2)Review and Update Medications, Allergies PMH, Surgeries, Social history in Epic 3)Hospitalizations in the last year with date/reason? no  4)Review and Update Care Team (patient's specialists) in Epic 5) Complete PHQ9 in Epic  6) Complete Fall Screening in Epic 7)Review all Health Maintenance Due and order under PCP if not done.  Medicare Wellness Patient Questionnaire:  Answer theses question about your habits: How often do you have a drink containing alcohol?no Have you ever smoked?No  How many packs a day do/did you smoke? N/A Do you use smokeless tobacco?No Do you use an illicit drugs?No On average, how many days per week do you engage in moderate to strenuous exercise (like a brisk walk)?none Typical breakfast: N/A Typical lunch: Varies  Typical dinner: Varies  Typical snacks: Crackers   Beverages: Water   Answer theses question about your everyday activities: Can you perform most household chores?No Are you deaf or have significant trouble hearing?Somewhat  Do you feel that you have a problem with memory?Little  Do you feel safe at home?Yes  Last dentist visit? 6-8 months  8. Do you have any difficulty performing your everyday activities?No Are you having any difficulty walking, taking medications on your own, and or difficulty managing daily home needs?No Do you have difficulty walking or climbing stairs?Yes  Do you have difficulty dressing or bathing? No Do you have difficulty doing errands alone such as visiting a doctor's office or shopping? Yes  Do you currently have any difficulty preparing food and  eating? No Do you currently have any difficulty using the toilet?No Do you have any difficulty managing your finances? No Do you have any difficulties with housekeeping of managing your housekeeping?No   Do you have Advanced Directives in place (Living Will, Healthcare Power or Attorney)? Yes    Last eye Exam and location? 1 month ago, [facility]    Do you currently use prescribed or non-prescribed narcotic or opioid pain medications? Yes, as needed  Do you have a history or close family history of breast, ovarian, tubal or peritoneal cancer or a family member with BRCA (breast cancer susceptibility 1 and 2) gene mutations? No     Nurse/Assistant Credentials/time stamp: Mg 3:09 PM    ----------------------------------------------------------------------------------------------------------------------------------------------------------------------------------------------------------------------  Because this visit was a virtual/telehealth visit, some criteria may be missing or patient reported. Any vitals not documented were not able to be obtained and vitals that have been documented are patient reported.    MEDICARE ANNUAL PREVENTIVE VISIT WITH PROVIDER: (Welcome to Medicare, initial annual wellness or annual wellness exam)  Virtual Visit via Phone Note  I connected with Danielle Gaines on 03/01/24 by phone and verified that I am speaking with the correct person using two identifiers. Prefers phone.   Location patient: home Location provider:work or home office Persons participating in the virtual visit: patient, provider  Concerns and/or follow up today: doing ok, but has had some ear issues - seeing ENT - plans to get back in and see them.  Also feels like she has gotten weaker over the last year because can't see well or drive and has back issues - seeing eye doctor and back doctor She  fell a few weeks ago and hurt leg - she thinks she might have fractured it - but she  didn't go to get it checked out.    See HM section in Epic for other details of completed HM.    ROS: negative for report of fevers, unintentional weight loss, chest pain, chang in baseline sob, hemoptysis, melena, hematochezia, hematuria, falls, bleeding or bruising, thoughts of suicide or self harm, memory loss  Patient-completed extensive health risk assessment - reviewed and discussed with the patient: See Health Risk Assessment completed with patient prior to the visit either above or in recent phone note. This was reviewed in detailed with the patient today and appropriate recommendations, orders and referrals were placed as needed per Summary below and patient instructions.   Review of Medical History: -PMH, PSH, Family History and current specialty and care providers reviewed and updated and listed below   Patient Care Team: Karie Georges, MD as PCP - General (Family Medicine) Sharrell Ku, MD as Consulting Physician (Gastroenterology) Elise Benne, MD as Consulting Physician (Ophthalmology) [facility], [facility] Romero Belling, MD (Inactive) as Consulting Physician (Endocrinology)   Past Medical History:  Diagnosis Date   Anemia    Anxiety    Arthritis    Benzodiazepine dependence (HCC)    Carotid arterial disease (HCC)    mild   Chronic kidney disease (CKD), stage III (moderate) (HCC) 08/01/2018   -seeing  Nephrology, Dr. 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 Evelene Croon   Diastolic heart failure [facility])    mild on echo 2016 no cardiologist   GERD (gastroesophageal reflux disease)    IBS (irritable bowel syndrome)    sees Dr. Kinnie Scales   Macular degeneration    goes to [facility] for this   Migraine    managed by neurologist   Osteoporosis    Sty    right eye   Uvulitis     Vertigo    seeing  dr bates for [facility] 08-11-2021 epic   Wears glasses    for reading   Wears hearing aid in both ears     Past Surgical History:  Procedure Laterality Date   ABDOMINAL ADHESION SURGERY     ABDOMINAL HYSTERECTOMY  1972   APPENDECTOMY     CHOLECYSTECTOMY  1996   colonscopy  1989   CYSTOSCOPY W/ URETERAL STENT PLACEMENT Right 04/27/2016   Procedure: CYSTOSCOPY WITH RETROGRADE PYELOGRAM/ RIGHT URETERAL STENT PLACEMENT;  Surgeon: Crist Fat, MD;  Location: [facility];  Service: Urology;  Laterality: Right;   CYSTOSCOPY WITH RETROGRADE PYELOGRAM, URETEROSCOPY AND STENT PLACEMENT Left 10/09/2021   Procedure: CYSTOSCOPY WITH LEFT RETROGRADE URETEROSCOPY WITH HOLMIUM LASER AND STENT PLACEMENT;  Surgeon: Bjorn Pippin, MD;  Location: [facility];  Service: Urology;  Laterality: Left;   ESOPHAGOGASTRODUODENOSCOPY     2002, 2011, 2014, 2021   EXTRACORPOREAL SHOCK WAVE LITHOTRIPSY Left 08/31/2021   Procedure: LEFT EXTRACORPOREAL SHOCK WAVE LITHOTRIPSY (ESWL);  Surgeon: Crist Fat, MD;  [facility];  Service: Urology;  Laterality: Left;   FLEXIBLE SIGMOIDOSCOPY     1991, 1994, 2006, 2011, 2021   HOLMIUM LASER APPLICATION Left 10/09/2021   Procedure: HOLMIUM LASER APPLICATION;  Surgeon: Bjorn Pippin, MD;  Location: [facility];  Service: Urology;  Laterality: Left;   lasix eye surgery     ou   MEMBRANECTOMY Left 09/21/2013   OVARIAN CYST SURGERY     PARS PLANA VITRECTOMY Left 09/21/2013   SMALL INTESTINE SURGERY     SUBTOTAL COLECTOMY     1991, 1994, 2006, 2011   tumor removal right shoulder  06/22/2021    Social History   Socioeconomic History   Marital status: Married    Spouse name: Not on file   Number of children: Not on file   Years of education: Not on file   Highest education level: Not on file  Occupational History   Not on file  Tobacco Use   Smoking status: Never   Smokeless tobacco: Never   Vaping Use   Vaping status: Never Used  Substance and Sexual Activity   Alcohol use: No   Drug use: No   Sexual activity: Not Currently    Birth control/protection: Post-menopausal  Other Topics Concern   Not on file  Social History Narrative   Work or School: none      Home Situation: lives with husband      Spiritual Beliefs:       Lifestyle: no regular exercise; diet is ok         Social Drivers of Corporate investment banker Strain: Medium Risk (03/01/2024)   Overall Financial Resource Strain (CARDIA)    Difficulty of Paying Living Expenses: Somewhat hard  Food Insecurity: No Food Insecurity (03/01/2024)   Hunger Vital Sign    Worried About Running Out of Food in the Last Year: Never true    Ran Out of Food in the Last Year: Never true  Transportation Needs: No Transportation Needs (03/01/2024)   PRAPARE - Administrator, Civil Service (Medical): No    Lack of Transportation (Non-Medical): No  Physical Activity: Inactive (03/01/2024)   Exercise Vital Sign    Days of Exercise per Week: 0 days    Minutes of Exercise per Session: 0 min  Stress: Stress Concern Present (03/01/2024)   [employer] of Occupational Health - Occupational Stress Questionnaire    Feeling of Stress : To some extent  Social Connections: Unknown (03/01/2024)   Social Connection and Isolation Panel [NHANES]    Frequency of Communication with Friends and Family: More than three times a week    Frequency of Social Gatherings with Friends and Family: Patient unable to answer    Attends Religious Services: More than 4 times per year    Active Member of [employer] or Organizations: No    Attends Banker Meetings: Never    Marital Status: Not on file  Intimate Partner Violence: Unknown (03/03/2022)   Received from [employer]   HITS    Physically Hurt: Not on file    Insult or Talk Down To: Not on file    Threaten Physical Harm: Not on file    Scream or Curse: Not on  file    Family History  Problem Relation Age of Onset   Kidney disease Mother    Heart disease Mother    Osteoporosis Mother    Heart Problems Sister        pacemaker   Cancer Maternal Aunt        intestinal and liver cancer   Stroke Maternal Grandmother    Heart disease Maternal Grandmother    Stroke Other    Depression Other  everyone   Hypercalcemia Neg Hx    Breast cancer Neg Hx     Current Outpatient Medications on File Prior to Visit  Medication Sig Dispense Refill   acetaminophen (TYLENOL) 500 MG tablet Take 1,000 mg by mouth daily as needed for headache.     Atogepant (QULIPTA) 60 MG TABS Take 60 mg by mouth daily.     brimonidine (ALPHAGAN P) 0.1 % SOLN Apply to eye.     CREON 36000-114000 units CPEP capsule Take 36,000 Units by mouth 3 (three) times daily.     cycloSPORINE (RESTASIS) 0.05 % ophthalmic emulsion Place 1 drop into both eyes 2 (two) times daily.     denosumab (PROLIA) 60 MG/ML SOSY injection Inject 60 mg into the skin every 6 (six) months.     Dorzolamide HCl-Timolol Mal PF 2-0.5 % SOLN INSTILL 1 DROP IN BOTH EYES TWICE DAILY (Patient taking differently: Place 1 drop into both eyes daily.) 60 each 0   folic acid (FOLVITE) 1 MG tablet Take by mouth.     furosemide (LASIX) 40 MG tablet Take 40 mg by mouth daily.     HYDROcodone-acetaminophen (NORCO/VICODIN) 5-325 MG tablet Take 1 tablet by mouth every 8 (eight) hours as needed for moderate pain (pain score 4-6). 20 tablet 0   LORazepam (ATIVAN) 0.5 MG tablet Take 0.5 mg by mouth in the morning, at noon, and at bedtime. 1 in am 1 at midday 2 at hs     meclizine (ANTIVERT) 25 MG tablet TAKE 1 TABLET(25 MG) BY MOUTH THREE TIMES DAILY AS NEEDED FOR DIZZINESS 30 tablet 2   neomycin-polymyxin b-dexamethasone (MAXITROL) 3.5-10000-0.1 OINT      olopatadine (PATANOL) 0.1 % ophthalmic solution 1 drop 2 (two) times daily.     omeprazole (PRILOSEC) 40 MG capsule Take 40 mg by mouth daily.     Probiotic Product  (ALIGN PO) Take by mouth daily.     promethazine (PHENERGAN) 12.5 MG tablet Take 0.5-1 tablets (6.25-12.5 mg total) by mouth every 8 (eight) hours as needed for nausea or vomiting. 30 tablet 3   Travoprost, BAK Free, (TRAVATAN) 0.004 % SOLN ophthalmic solution 1 drop at bedtime.     UBRELVY 100 MG TABS      amLODipine (NORVASC) 2.5 MG tablet Take 1 tablet (2.5 mg total) by mouth daily. 180 tablet 3   atorvastatin (LIPITOR) 40 MG tablet Take 1 tablet (40 mg total) by mouth daily. 90 tablet 3   Current Facility-Administered Medications on File Prior to Visit  Medication Dose Route Frequency Provider Last Rate Last Admin   denosumab (PROLIA) injection 60 mg  60 mg Subcutaneous Once Carlus Pavlov, MD        Allergies  Allergen Reactions   Elemental Sulfur Other (See Comments)   Latanoprost     Other reaction(s): Other (See Comments) Severe burning.   Sulfa Antibiotics Swelling and Other (See Comments)    Reaction:  Unspecified swelling reaction        Physical Exam Vitals requested from patient and listed below if patient had equipment and was able to obtain at home for this virtual visit: There were no vitals filed for this visit. Estimated body mass index is 27.3 kg/m as calculated from the following:   Height as of 12/27/23: 5\' 1"  (1.549 m).   Weight as of 12/27/23: 144 lb 8 oz (65.5 kg).  EKG (optional): deferred due to virtual visit  GENERAL: alert, oriented, no acute distress detected, full vision exam deferred due to pandemic  and/or virtual encounter  PSYCH/NEURO: pleasant and cooperative, no obvious depression or anxiety, speech and thought processing grossly intact, Cognitive function grossly intact  Flowsheet Row Clinical Support from 03/01/2024 in [facility]  PHQ-9 Total Score 7           03/01/2024    2:58 PM 08/17/2022    1:08 PM 04/27/2022   12:28 PM 04/06/2022    3:03 PM 09/03/2020   10:55 AM  Depression screen PHQ 2/9   Decreased Interest 0 3 2 3  0  Down, Depressed, Hopeless 0 1 2 2  0  PHQ - 2 Score 0 4 4 5  0  Altered sleeping 2 1 1 1    Tired, decreased energy 3 2 2 2    Change in appetite 2 1 0 0   Feeling bad or failure about yourself  0 0 0 0   Trouble concentrating 0 0 1 0   Moving slowly or fidgety/restless 0 0 1 0   Suicidal thoughts 0 0 0 0   PHQ-9 Score 7 8 9 8    Difficult doing work/chores Not difficult at all Somewhat difficult Very difficult Extremely dIfficult   Reports just tired from chronic health issues.      04/06/2022    3:04 PM 05/17/2023    3:05 PM 12/27/2023    2:12 PM 03/01/2024    3:00 PM 03/01/2024    3:47 PM  Fall Risk  Falls in the past year? 1 0 0 0 1  Was there an injury with Fall? 0 0 0 0 1  Fall Risk Category Calculator 1 0 0 0 2  Fall Risk Category (Retired) Low      (RETIRED) Patient Fall Risk Level Low fall risk      Patient at Risk for Falls Due to History of fall(s) No Fall Risks Impaired balance/gait No Fall Risks   Fall risk Follow up Falls evaluation completed Falls evaluation completed Falls evaluation completed Falls evaluation completed Falls evaluation completed;Education provided     SUMMARY AND PLAN:  Encounter for Medicare annual wellness exam   Discussed applicable health maintenance/preventive health measures and advised and referred or ordered per patient preferences: -discussed vaccine due and advised can get at the pharmacy -discussed bone density and offered to order, she agrees to check with Dr. Elvera Lennox whom she says is managing the prolia Health Maintenance  Topic Date Due   COVID-19 Vaccine (4 - 2024-25 season) 07/31/2023   INFLUENZA VACCINE  06/29/2024   Medicare Annual Wellness (AWV)  03/01/2025   DTaP/Tdap/Td (2 - Tdap) 07/16/2025   Pneumonia Vaccine 110+ Years old  Completed   DEXA SCAN  Completed   Zoster Vaccines- Shingrix  Completed   HPV VACCINES  Aged Out   Colonoscopy  Discontinued   Hepatitis C Screening  Discontinued       Education and counseling on the following was provided based on the above review of health and a plan/checklist for the patient, along with additional information discussed, was provided for the patient in the patient instructions :  -for hearing and issues with ears advised inperson eval with her ENT, she agrees -advised inperson eval for her leg and she prefers to see ortho - discussed ortho urgent care options and she says she prefers that and will have her husband take her -Provided counseling and plan for increased risk of falling if applicable per above screening. Advised PT and offered to refer, but she declined. Provided safe balance exercises that can  be done at home to improve balance and discussed exercise guidelines for adults with include balance exercises at least 3 days per week.  -Advised and counseled on a healthy lifestyle - including the importance of a healthy diet, regular physical activity, social connections and stress management. -Reviewed patient's current diet. Advised and counseled on a whole foods based healthy diet. A summary of a healthy diet was provided in the Patient Instructions.  -reviewed patient's current physical activity level and discussed exercise guidelines for adults. Provided community resources and ideas for safe exercise at home to assist in meeting exercise guideline recommendations in a safe and healthy way. I think she would benefit from PT but she declines and says she will ask her back doc if decides to do.  -Advise yearly dental visits at minimum and regular eye exams   Follow up: see patient instructions     Patient Instructions  I really enjoyed getting to talk with you today! I am available on Tuesdays and Thursdays for virtual visits if you have any questions or concerns, or if I can be of any further assistance.   CHECKLIST FROM ANNUAL WELLNESS VISIT:  -Follow up (please call to schedule if not scheduled after visit):   -please  see orthopedic or schedule visit in office for the leg issues   -yearly for annual wellness visit with primary care office  Here is a list of your preventive care/health maintenance measures and the plan for each if any are due:  PLAN For any measures below that may be due:  -can get vaccine at the pharmacy  Health Maintenance  Topic Date Due   COVID-19 Vaccine (4 - 2024-25 season) 07/31/2023   INFLUENZA VACCINE  06/29/2024   Medicare Annual Wellness (AWV)  03/01/2025   DTaP/Tdap/Td (2 - Tdap) 07/16/2025   Pneumonia Vaccine 61+ Years old  Completed   DEXA SCAN  Completed   Zoster Vaccines- Shingrix  Completed   HPV VACCINES  Aged Out   Colonoscopy  Discontinued   Hepatitis C Screening  Discontinued    -See a dentist at least yearly  -Get your eyes checked and then per your eye specialist's recommendations  -Other issues addressed today:   I think you would benefit from home Physical Therapy, please let us know if you want [place] to order to see if insurance would cover.   -I have included below further information regarding a healthy whole foods based diet, physical activity guidelines for adults, stress management and opportunities for social connections. I hope you find this information useful.   -----------------------------------------------------------------------------------------------------------------------------------------------------------------------------------------------------------------------------------------------------------    NUTRITION: -eat real food: lots of colorful vegetables (half the plate) and fruits -5-7 servings of vegetables and fruits per day (fresh or steamed is best), exp. 2 servings of vegetables with lunch and dinner and 2 servings of fruit per day. Berries and greens such as kale and collards are great choices.  -consume on a regular basis:  fresh fruits, fresh veggies, fish, nuts, seeds, healthy oils (such as olive oil, avocado oil), whole  grains (make sure for bread/pasta/crackers/etc., that the first ingredient on label contains the word "whole"), legumes. -can eat small amounts of dairy and lean meat (no larger than the palm of your hand), but avoid processed meats such as ham, bacon, lunch meat, etc. -drink water -try to avoid fast food and pre-packaged foods, processed meat, ultra processed foods/beverages (donuts, candy, etc.) -most experts advise limiting sodium to < 2300mg  per day, should limit further is any chronic conditions  such as high blood pressure, heart disease, diabetes, etc. The American Heart Association advised that < 1500mg  is is ideal -try to avoid foods/beverages that contain any ingredients with names you do not recognize  -try to avoid foods/beverages  with added sugar or sweeteners/sweets  -try to avoid sweet drinks (including diet drinks): soda, juice, Gatorade, sweet tea, power drinks, diet drinks -try to avoid white rice, white bread, pasta (unless whole grain)  EXERCISE GUIDELINES FOR ADULTS: -if you wish to increase your physical activity, do so gradually and with the approval of your doctor -STOP and seek medical care immediately if you have any chest pain, chest discomfort or trouble breathing when starting or increasing exercise  -move and stretch your body, legs, feet and arms when sitting for long periods -Physical activity guidelines for optimal health in adults: -get at least 150 minutes per week of moderate exercise (can talk, but not sing); this is about 20-30 minutes of sustained activity 5-7 days per week or two 10-15 minute episodes of sustained activity 5-7 days per week -do some muscle building/resistance training/strength training at least 2 days per week  -balance exercises 3+ days per week:   Stand somewhere where you have something sturdy to hold onto if you lose balance    1) lift up on toes, then back down, start with 5x per day and work up to 20x   2) stand and lift one leg  straight out to the side so that foot is a few inches of the floor, start with 5x each side and work up to 20x each side   3) stand on one foot, start with 5 seconds each side and work up to 20 seconds on each side  If you need ideas or help with getting more active:  -Silver sneakers https://tools.silversneakers.com  -Walk with a Doc: http://www.duncan-williams.com/  -try to include resistance (weight lifting/strength building) and balance exercises twice per week: or the following link for ideas: http://castillo-powell.com/  BuyDucts.dk  STRESS MANAGEMENT: -can try meditating, or just sitting quietly with deep breathing while intentionally relaxing all parts of your body for 5 minutes daily -if you need further help with stress, anxiety or depression please follow up with your primary doctor or contact the wonderful folks at [facility]: [phone]  SOCIAL CONNECTIONS: -options in [place] if you wish to engage in more social and exercise related activities:  -Silver sneakers https://tools.silversneakers.com  -Walk with a Doc: http://www.duncan-williams.com/  -Check out the [facility] Active Adults 50+ section on the [place] of [employer] (hiking clubs, book clubs, cards and games, chess, exercise classes, aquatic classes and much more) - see the website for details: https://www.[place].gov/departments/parks-recreation/active-adults50  -YouTube has lots of exercise videos for different ages and abilities as well  -[facility] (a variety of indoor and outdoor inperson activities for adults). (631)849-5995. [address].  -Virtual Online Classes (a variety of topics): see seniorplanet.org or call (435)195-4595  -consider volunteering at a school, hospice center, church, senior center or elsewhere            Terressa Koyanagi, DO

## 2024-03-01 NOTE — Patient Instructions (Addendum)
 I really enjoyed getting to talk with you today! I am available on Tuesdays and Thursdays for virtual visits if you have any questions or concerns, or if I can be of any further assistance.   CHECKLIST FROM ANNUAL WELLNESS VISIT:  -Follow up (please call to schedule if not scheduled after visit):   -please see orthopedic or schedule visit in office for the leg issues   -yearly for annual wellness visit with primary care office  Here is a list of your preventive care/health maintenance measures and the plan for each if any are due:  PLAN For any measures below that may be due:  -can get vaccine at the pharmacy  Health Maintenance  Topic Date Due   COVID-19 Vaccine (4 - 2024-25 season) 07/31/2023   INFLUENZA VACCINE  06/29/2024   Medicare Annual Wellness (AWV)  03/01/2025   DTaP/Tdap/Td (2 - Tdap) 07/16/2025   Pneumonia Vaccine 50+ Years old  Completed   DEXA SCAN  Completed   Zoster Vaccines- Shingrix  Completed   HPV VACCINES  Aged Out   Colonoscopy  Discontinued   Hepatitis C Screening  Discontinued    -See a dentist at least yearly  -Get your eyes checked and then per your eye specialist's recommendations  -Other issues addressed today:   I think you would benefit from home Physical Therapy, please let us know if you want [place] to order to see if insurance would cover.   -I have included below further information regarding a healthy whole foods based diet, physical activity guidelines for adults, stress management and opportunities for social connections. I hope you find this information useful.   -----------------------------------------------------------------------------------------------------------------------------------------------------------------------------------------------------------------------------------------------------------    NUTRITION: -eat real food: lots of colorful vegetables (half the plate) and fruits -5-7 servings of vegetables and fruits per  day (fresh or steamed is best), exp. 2 servings of vegetables with lunch and dinner and 2 servings of fruit per day. Berries and greens such as kale and collards are great choices.  -consume on a regular basis:  fresh fruits, fresh veggies, fish, nuts, seeds, healthy oils (such as olive oil, avocado oil), whole grains (make sure for bread/pasta/crackers/etc., that the first ingredient on label contains the word "whole"), legumes. -can eat small amounts of dairy and lean meat (no larger than the palm of your hand), but avoid processed meats such as ham, bacon, lunch meat, etc. -drink water -try to avoid fast food and pre-packaged foods, processed meat, ultra processed foods/beverages (donuts, candy, etc.) -most experts advise limiting sodium to < 2300mg  per day, should limit further is any chronic conditions such as high blood pressure, heart disease, diabetes, etc. The American Heart Association advised that < 1500mg  is is ideal -try to avoid foods/beverages that contain any ingredients with names you do not recognize  -try to avoid foods/beverages  with added sugar or sweeteners/sweets  -try to avoid sweet drinks (including diet drinks): soda, juice, Gatorade, sweet tea, power drinks, diet drinks -try to avoid white rice, white bread, pasta (unless whole grain)  EXERCISE GUIDELINES FOR ADULTS: -if you wish to increase your physical activity, do so gradually and with the approval of your doctor -STOP and seek medical care immediately if you have any chest pain, chest discomfort or trouble breathing when starting or increasing exercise  -move and stretch your body, legs, feet and arms when sitting for long periods -Physical activity guidelines for optimal health in adults: -get at least 150 minutes per week of moderate exercise (can talk, but not sing);  this is about 20-30 minutes of sustained activity 5-7 days per week or two 10-15 minute episodes of sustained activity 5-7 days per week -do some  muscle building/resistance training/strength training at least 2 days per week  -balance exercises 3+ days per week:   Stand somewhere where you have something sturdy to hold onto if you lose balance    1) lift up on toes, then back down, start with 5x per day and work up to 20x   2) stand and lift one leg straight out to the side so that foot is a few inches of the floor, start with 5x each side and work up to 20x each side   3) stand on one foot, start with 5 seconds each side and work up to 20 seconds on each side  If you need ideas or help with getting more active:  -Silver sneakers https://tools.silversneakers.com  -Walk with a Doc: http://www.duncan-williams.com/  -try to include resistance (weight lifting/strength building) and balance exercises twice per week: or the following link for ideas: http://castillo-powell.com/  BuyDucts.dk  STRESS MANAGEMENT: -can try meditating, or just sitting quietly with deep breathing while intentionally relaxing all parts of your body for 5 minutes daily -if you need further help with stress, anxiety or depression please follow up with your primary doctor or contact the wonderful folks at [facility]: [phone]  SOCIAL CONNECTIONS: -options in [place] if you wish to engage in more social and exercise related activities:  -Silver sneakers https://tools.silversneakers.com  -Walk with a Doc: http://www.duncan-williams.com/  -Check out the [facility] Active Adults 50+ section on the [place] of [employer] (hiking clubs, book clubs, cards and games, chess, exercise classes, aquatic classes and much more) - see the website for details: https://www.[place].gov/departments/parks-recreation/active-adults50  -YouTube has lots of exercise videos for different ages and abilities as well  -[facility] (a variety of indoor and outdoor  inperson activities for adults). [phone]. [address].  -Virtual Online Classes (a variety of topics): see seniorplanet.org or call [phone]  -consider volunteering at a school, hospice center, church, senior center or elsewhere

## 2024-03-01 NOTE — Progress Notes (Signed)
 Patient unable to obtain vital signs due to telehealth visit

## 2024-03-07 DIAGNOSIS — H353221 Exudative age-related macular degeneration, left eye, with active choroidal neovascularization: Secondary | ICD-10-CM | POA: Diagnosis not present

## 2024-03-07 NOTE — Telephone Encounter (Addendum)
 LMx1 for patient to call office to schedule Prolia injection.

## 2024-03-09 NOTE — Telephone Encounter (Signed)
 LMx3 for patient to call office to schedule next Prolia injection.

## 2024-04-03 ENCOUNTER — Telehealth: Payer: Self-pay | Admitting: Internal Medicine

## 2024-04-03 DIAGNOSIS — H16223 Keratoconjunctivitis sicca, not specified as Sjogren's, bilateral: Secondary | ICD-10-CM | POA: Diagnosis not present

## 2024-04-03 DIAGNOSIS — H524 Presbyopia: Secondary | ICD-10-CM | POA: Diagnosis not present

## 2024-04-03 DIAGNOSIS — H52221 Regular astigmatism, right eye: Secondary | ICD-10-CM | POA: Diagnosis not present

## 2024-04-03 DIAGNOSIS — H5211 Myopia, right eye: Secondary | ICD-10-CM | POA: Diagnosis not present

## 2024-04-03 NOTE — Telephone Encounter (Signed)
 Patient spouse called to make appointment for Prolia  injection for patient.  In looking for VOB, etc I found that patient has not been seen since 05.11.2022  with Dr. [place] Hacker. She did have an appointment  scheduled with Dr. Aldona Amel on 12.12.23 that was cancelled by patient.  Should the patient be seen by a provider before injection since she has not been seen since 03/2022 and if so will she need a referral??  History of visit and Prolia  injections below   Office Visit scheduled - Gherghe 12.12.23 (cancelled) Office Visit scheduled - [place] Hacker 05.11.2023 (cancelled)   Prolia  injections last given: 05.14.2025 09.19.2023 03.15.2023  There is a VOB from 03.01.2025 that shows:  Patient is ready for scheduling on or after 11/30/23 BUY AND BILL   Out-of-pocket cost due at time of visit: $356.87   Primary: [employer] Medicare Adv Gold Plus Prolia  co-insurance: 20% (approximately $331.87) Admin fee co-insurance: 20% (approximately $25)   Deductible: does not apply   Prior Auth: APPROVED PA# [id number] Valid:05/16/20-11/28/24

## 2024-04-04 NOTE — Telephone Encounter (Signed)
 She needs to be seen in clinic before for the Prolia  injections, especially since it has been 1 year since the last injection in 3 years since the last appointment in the office.  I am not seeing any new patients right now.

## 2024-04-05 DIAGNOSIS — R42 Dizziness and giddiness: Secondary | ICD-10-CM | POA: Diagnosis not present

## 2024-04-05 DIAGNOSIS — H903 Sensorineural hearing loss, bilateral: Secondary | ICD-10-CM | POA: Diagnosis not present

## 2024-04-05 DIAGNOSIS — R519 Headache, unspecified: Secondary | ICD-10-CM | POA: Diagnosis not present

## 2024-04-05 DIAGNOSIS — G8929 Other chronic pain: Secondary | ICD-10-CM | POA: Diagnosis not present

## 2024-04-09 ENCOUNTER — Ambulatory Visit: Admitting: Endocrinology

## 2024-04-17 ENCOUNTER — Ambulatory Visit: Admitting: Sports Medicine

## 2024-04-18 DIAGNOSIS — H353231 Exudative age-related macular degeneration, bilateral, with active choroidal neovascularization: Secondary | ICD-10-CM | POA: Diagnosis not present

## 2024-04-24 ENCOUNTER — Other Ambulatory Visit: Payer: Self-pay | Admitting: Family Medicine

## 2024-04-24 ENCOUNTER — Ambulatory Visit: Admitting: Endocrinology

## 2024-04-24 DIAGNOSIS — Z1231 Encounter for screening mammogram for malignant neoplasm of breast: Secondary | ICD-10-CM

## 2024-04-26 ENCOUNTER — Telehealth: Payer: Self-pay | Admitting: Physical Medicine and Rehabilitation

## 2024-04-26 NOTE — Telephone Encounter (Signed)
 Patient's husband was here. She would like an appointment for an injection.

## 2024-04-30 ENCOUNTER — Telehealth: Payer: Self-pay | Admitting: Physical Medicine and Rehabilitation

## 2024-04-30 NOTE — Telephone Encounter (Signed)
 Pt's husband called for appt appt for his wife back injection. Please call pt at [phone].

## 2024-05-08 DIAGNOSIS — Z411 Encounter for cosmetic surgery: Secondary | ICD-10-CM | POA: Diagnosis not present

## 2024-05-08 DIAGNOSIS — D485 Neoplasm of uncertain behavior of skin: Secondary | ICD-10-CM | POA: Diagnosis not present

## 2024-05-08 DIAGNOSIS — C44311 Basal cell carcinoma of skin of nose: Secondary | ICD-10-CM | POA: Diagnosis not present

## 2024-05-08 DIAGNOSIS — L309 Dermatitis, unspecified: Secondary | ICD-10-CM | POA: Diagnosis not present

## 2024-05-08 DIAGNOSIS — L821 Other seborrheic keratosis: Secondary | ICD-10-CM | POA: Diagnosis not present

## 2024-05-08 DIAGNOSIS — L720 Epidermal cyst: Secondary | ICD-10-CM | POA: Diagnosis not present

## 2024-05-09 ENCOUNTER — Ambulatory Visit
Admission: RE | Admit: 2024-05-09 | Discharge: 2024-05-09 | Disposition: A | Discharge status: Home / Self Care | Source: Ambulatory Visit | Attending: Family Medicine | Admitting: Family Medicine

## 2024-05-09 ENCOUNTER — Ambulatory Visit

## 2024-05-09 DIAGNOSIS — Z1231 Encounter for screening mammogram for malignant neoplasm of breast: Secondary | ICD-10-CM | POA: Diagnosis not present

## 2024-05-14 ENCOUNTER — Ambulatory Visit: Payer: Self-pay | Admitting: Family Medicine

## 2024-05-15 ENCOUNTER — Ambulatory Visit: Admitting: Physical Medicine and Rehabilitation

## 2024-05-16 DIAGNOSIS — M5030 Other cervical disc degeneration, unspecified cervical region: Secondary | ICD-10-CM | POA: Diagnosis not present

## 2024-05-16 DIAGNOSIS — M5136 Other intervertebral disc degeneration, lumbar region with discogenic back pain only: Secondary | ICD-10-CM | POA: Diagnosis not present

## 2024-05-23 DIAGNOSIS — H353221 Exudative age-related macular degeneration, left eye, with active choroidal neovascularization: Secondary | ICD-10-CM | POA: Diagnosis not present

## 2024-06-05 DIAGNOSIS — C44311 Basal cell carcinoma of skin of nose: Secondary | ICD-10-CM | POA: Diagnosis not present

## 2024-06-14 DIAGNOSIS — Z791 Long term (current) use of non-steroidal anti-inflammatories (NSAID): Secondary | ICD-10-CM | POA: Diagnosis not present

## 2024-06-14 DIAGNOSIS — K5901 Slow transit constipation: Secondary | ICD-10-CM | POA: Diagnosis not present

## 2024-06-14 DIAGNOSIS — R151 Fecal smearing: Secondary | ICD-10-CM | POA: Diagnosis not present

## 2024-06-14 DIAGNOSIS — K219 Gastro-esophageal reflux disease without esophagitis: Secondary | ICD-10-CM | POA: Diagnosis not present

## 2024-06-22 DIAGNOSIS — N1831 Chronic kidney disease, stage 3a: Secondary | ICD-10-CM | POA: Diagnosis not present

## 2024-06-22 DIAGNOSIS — H401133 Primary open-angle glaucoma, bilateral, severe stage: Secondary | ICD-10-CM | POA: Diagnosis not present

## 2024-06-22 DIAGNOSIS — K5901 Slow transit constipation: Secondary | ICD-10-CM | POA: Diagnosis not present

## 2024-06-23 LAB — LAB REPORT - SCANNED: EGFR: 49

## 2024-06-26 ENCOUNTER — Encounter: Payer: Self-pay | Admitting: Family Medicine

## 2024-06-26 ENCOUNTER — Ambulatory Visit (INDEPENDENT_AMBULATORY_CARE_PROVIDER_SITE_OTHER): Payer: Medicare HMO | Admitting: Family Medicine

## 2024-06-26 VITALS — BP 132/68 | HR 65 | Temp 98.2°F | Ht 61.0 in | Wt 138.9 lb

## 2024-06-26 DIAGNOSIS — Z0000 Encounter for general adult medical examination without abnormal findings: Secondary | ICD-10-CM

## 2024-06-26 DIAGNOSIS — Z780 Asymptomatic menopausal state: Secondary | ICD-10-CM

## 2024-06-26 DIAGNOSIS — R42 Dizziness and giddiness: Secondary | ICD-10-CM

## 2024-06-26 MED ORDER — MECLIZINE HCL 25 MG PO TABS: 25.0000 mg | ORAL_TABLET | Freq: Three times a day (TID) | ORAL | 3 refills | Status: AC | PRN

## 2024-06-26 NOTE — Progress Notes (Signed)
 Complete physical exam  Patient: Danielle Gaines   DOB: March 06, 1941   83 y.o. Female  MRN: [id number]  Subjective:    Chief Complaint  Patient presents with   Medical Management of Chronic Issues    Danielle Gaines is a 83 y.o. female who presents today for a complete physical exam. She reports consuming a eats peanut butter sandwiches and drinks protein shakes 2-3 per day diet, states that she doesn't have much of an appetite. Exercise is limited by chronic neck and back pain, also patient has poor vision which limits her ability to exercise. She generally feels fairly well. She reports sleeping poorly, sees a psychiatrist who is prescribing trazodone  which helps somewhat. She does not have additional problems to discuss today.    Most recent fall risk assessment:    06/26/2024   12:55 PM  Fall Risk   Falls in the past year? 1  Number falls in past yr: 0  Injury with Fall? 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   Contact dermatitis 09/10/2021   Hand eczema 09/10/2021   Elective procedure for unacceptable cosmetic appearance 09/10/2021   Lipoma 09/10/2021   Peripheral venous insufficiency 09/10/2021   Pruritus, unspecified 09/10/2021   Low back pain 07/16/2021   Hip pain, bilateral 06/11/2021   Myalgia 04/08/2021   Vasomotor rhinitis 11/04/2020   Referred otalgia of both ears 07/08/2020   Right ear pain 05/09/2020   Bilateral impacted cerumen 09/13/2019    Excessive sweating 06/13/2019   Anterior epistaxis 08/14/2018   Aphthous ulcer 08/14/2018   Dysfunction of both eustachian tubes 08/14/2018   Recurrent epistaxis 08/14/2018   Chronic kidney disease (CKD), stage III (moderate) (HCC) 08/01/2018   Anemia, chronic renal failure 08/01/2018   Episcleritis of left eye 02/23/2018   New daily persistent headache 01/10/2018   Temporal arteritis (HCC) 01/10/2018   Bilateral temporomandibular joint pain 01/10/2018   Constipation 10/18/2017   Sensorineural hearing loss (SNHL), bilateral 09/04/2017   Hearing loss 09/01/2017   Tremor of both hands 09/01/2017   Postural dizziness 09/01/2017   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   amLODipine  (NORVASC ) 2.5 MG tablet Take 1 tablet (2.5 mg total) by mouth daily.   amphetamine-dextroamphetamine (ADDERALL XR) 20 MG 24 hr capsule Take 20 mg by mouth daily.   Atogepant (QULIPTA) 60 MG TABS Take 60 mg by mouth daily.   atorvastatin  (LIPITOR) 40 MG tablet Take 1 tablet (40 mg total) by mouth daily.   buPROPion  (WELLBUTRIN  XL) 150 MG 24 hr tablet Take 150 mg by mouth every morning.   citalopram (CELEXA) 40 MG tablet Take 40 mg by mouth daily.   CREON  36000-114000 units CPEP capsule Take 36,000 Units by mouth 3 (three) times daily.   cycloSPORINE  (RESTASIS ) 0.05 % ophthalmic emulsion Place 1 drop into both eyes 2 (two) times daily.   Dorzolamide HCl-Timolol Mal PF 2-0.5 % SOLN INSTILL 1 DROP IN BOTH EYES TWICE DAILY (Patient taking differently: Place 1 drop into both eyes daily.)   folic acid (FOLVITE) 1 MG tablet Take by mouth.   furosemide  (LASIX ) 40 MG tablet Take 40 mg by mouth daily.   LORazepam  (ATIVAN ) 0.5 MG tablet Take 0.5 mg by mouth in the morning, at noon, and at bedtime. 1 in am 1 at midday 2 at hs (Patient taking differently: Take 0.5 mg by mouth 2 (two) times daily. 1 in am 1 at midday 2 at hs)   neomycin-polymyxin b-dexamethasone  (MAXITROL) 3.5-10000-0.1 OINT    olopatadine  (PATANOL) 0.1 % ophthalmic solution 1 drop 2 (two) times daily.   omeprazole (PRILOSEC) 40 MG capsule Take 40 mg by mouth daily.   Probiotic Product (ALIGN PO) Take by mouth daily.   sodium bicarbonate  650 MG tablet Take 650 mg by mouth 2 (two) times daily.   Travoprost, BAK Free, (TRAVATAN) 0.004 % SOLN ophthalmic solution 1 drop at bedtime.   UBRELVY 100 MG TABS    [DISCONTINUED] HYDROcodone -acetaminophen  (NORCO/VICODIN) 5-325 MG tablet Take 1 tablet by mouth every 8 (eight) hours as needed for moderate pain (pain score  4-6).   [DISCONTINUED] meclizine  (ANTIVERT ) 25 MG tablet TAKE 1 TABLET(25 MG) BY MOUTH THREE TIMES DAILY AS NEEDED FOR DIZZINESS   baclofen (LIORESAL) 10 MG tablet Take 10 mg by mouth at bedtime as needed.   traZODone  (DESYREL ) 50 MG tablet Take 100 mg by mouth at bedtime as needed.   [DISCONTINUED] brimonidine (ALPHAGAN P) 0.1 % SOLN Apply to eye.   [DISCONTINUED] denosumab  (PROLIA ) 60 MG/ML SOSY injection Inject 60 mg into the skin every 6 (six) months.   [DISCONTINUED] promethazine  (PHENERGAN ) 12.5 MG tablet Take 0.5-1 tablets (6.25-12.5 mg total) by mouth every 8 (eight) hours as needed for nausea or vomiting.   Facility-Administered Medications Prior to Visit  Medication Dose Route Frequency Provider   denosumab  (PROLIA ) injection 60 mg  60 mg Subcutaneous Once Gherghe, Cristina, MD    Review of Systems  HENT:  Negative for hearing loss.   Eyes:  Negative for blurred vision.  Respiratory:  Negative for shortness of breath.   Cardiovascular:  Negative for chest  pain.  Gastrointestinal: Negative.   Genitourinary: Negative.   Musculoskeletal:  Negative for back pain.  Neurological:  Negative for headaches.  Psychiatric/Behavioral:  Negative for depression.        Objective:     BP 132/68   Pulse 65   Temp 98.2 F (36.8 C) (Oral)   Ht 5' 1 (1.549 m)   Wt 138 lb 14.4 oz (63 kg)   SpO2 98%   BMI 26.24 kg/m    Physical Exam Vitals reviewed.  Constitutional:      Appearance: Normal appearance. She is well-groomed and normal weight.  HENT:     Right Ear: Tympanic membrane and ear canal normal.     Left Ear: Tympanic membrane and ear canal normal.     Mouth/Throat:     Mouth: Mucous membranes are moist.     Pharynx: No posterior oropharyngeal erythema.  Eyes:     Conjunctiva/sclera: Conjunctivae normal.  Neck:     Thyroid : No thyromegaly.  Cardiovascular:     Rate and Rhythm: Normal rate and regular rhythm.     Pulses: Normal pulses.     Heart sounds: S1 normal  and S2 normal.  Pulmonary:     Effort: Pulmonary effort is normal.     Breath sounds: Normal breath sounds and air entry.  Abdominal:     General: Abdomen is flat. Bowel sounds are normal.     Palpations: Abdomen is soft.  Musculoskeletal:     Right lower leg: No edema.     Left lower leg: No edema.  Lymphadenopathy:     Cervical: No cervical adenopathy.  Neurological:     Mental Status: She is alert and oriented to person, place, and time. Mental status is at baseline.     Gait: Gait is intact.  Psychiatric:        Mood and Affect: Mood and affect normal.        Speech: Speech normal.        Behavior: Behavior normal.        Judgment: Judgment normal.      No results found for any visits on 06/26/24.     Assessment & Plan:    Routine Health Maintenance and Physical Exam  Immunization History  Administered Date(s) Administered   Fluad Quad(high Dose 65+) 09/05/2019, 09/03/2020, 08/17/2022   Fluad Trivalent(High Dose 65+) 12/27/2023   Influenza, High Dose Seasonal PF 10/01/2014, 08/26/2015, 09/17/2016, 08/18/2017, 08/03/2018, 09/05/2019   Influenza-Unspecified 10/01/2014, 08/26/2015, 09/17/2016, 08/18/2017, 08/03/2018, 09/05/2019, 07/30/2021   PFIZER(Purple Top)SARS-COV-2 Vaccination 01/25/2020, 02/22/2020, 09/29/2021   Pneumococcal Conjugate-13 10/01/2014   Pneumococcal Polysaccharide-23 08/18/2017   Td 07/17/2015   Zoster Recombinant(Shingrix) 09/05/2019, 12/06/2019    Health Maintenance  Topic Date Due   COVID-19 Vaccine (4 - 2024-25 season) 07/31/2023   INFLUENZA VACCINE  06/29/2024   Medicare Annual Wellness (AWV)  03/01/2025   DTaP/Tdap/Td (2 - Tdap) 07/16/2025   Pneumococcal Vaccine: 50+ Years  Completed   DEXA SCAN  Completed   Zoster Vaccines- Shingrix  Completed   Hepatitis B Vaccines  Aged Out   HPV VACCINES  Aged Out   Meningococcal B Vaccine  Aged Out   Colonoscopy  Discontinued   Hepatitis C Screening  Discontinued    Discussed health benefits  of physical activity, and encouraged her to engage in regular exercise appropriate for her age and condition.  Routine adult health maintenance  Dizziness -     Meclizine  HCl; Take 1 tablet (25 mg total) by mouth 3 (three)  times daily as needed for dizziness.  Dispense: 30 tablet; Refill: 3  Postmenopausal state -     DG Bone Density; Future  Normal physical exam findings except for her chronic eye findings. I counseled the patient on healthy dietary choices, pt has multiple medical issues and is on multiple medications for various conditions. I reviewed preventative screening, immunizations, and medical history and updated in the chart, and appropriate labs and vaccinations were ordered. Handouts given on healthy eating and exercise.  Will ask Dr. Tobie to send her labs to us  for review.  Return in about 6 months (around 12/27/2024) for HTN.     Heron CHRISTELLA Sharper, MD

## 2024-06-27 DIAGNOSIS — H353231 Exudative age-related macular degeneration, bilateral, with active choroidal neovascularization: Secondary | ICD-10-CM | POA: Diagnosis not present

## 2024-07-03 DIAGNOSIS — N200 Calculus of kidney: Secondary | ICD-10-CM | POA: Diagnosis not present

## 2024-07-24 DIAGNOSIS — N2581 Secondary hyperparathyroidism of renal origin: Secondary | ICD-10-CM | POA: Diagnosis not present

## 2024-07-24 DIAGNOSIS — N1831 Chronic kidney disease, stage 3a: Secondary | ICD-10-CM | POA: Diagnosis not present

## 2024-07-24 DIAGNOSIS — D631 Anemia in chronic kidney disease: Secondary | ICD-10-CM | POA: Diagnosis not present

## 2024-07-24 DIAGNOSIS — K589 Irritable bowel syndrome without diarrhea: Secondary | ICD-10-CM | POA: Diagnosis not present

## 2024-08-08 DIAGNOSIS — H353221 Exudative age-related macular degeneration, left eye, with active choroidal neovascularization: Secondary | ICD-10-CM | POA: Diagnosis not present

## 2024-08-10 ENCOUNTER — Telehealth: Payer: Self-pay | Admitting: Family Medicine

## 2024-08-10 DIAGNOSIS — G43911 Migraine, unspecified, intractable, with status migrainosus: Secondary | ICD-10-CM

## 2024-08-10 NOTE — Telephone Encounter (Signed)
 Patient has been seeing a neurologist in [place], however the patient would like to be referred to a good Neurologist to transfer to that is in [place].  Patient's husband states it's not because they aren't good it's just the travel over there is hard for them.   If you need to speak with the husband, Christopher can be reached at [phone]

## 2024-08-14 NOTE — Telephone Encounter (Signed)
 Left a message for Danielle Gaines to return my call.

## 2024-08-14 NOTE — Telephone Encounter (Signed)
 Ok to place referral

## 2024-08-14 NOTE — Telephone Encounter (Signed)
 Mr Hutmacher called back and stated the patient requested a referral due to migraines.  He is aware the referral was entered and someone will contact him with further information.

## 2024-08-24 ENCOUNTER — Other Ambulatory Visit: Payer: Self-pay

## 2024-08-24 ENCOUNTER — Emergency Department (HOSPITAL_COMMUNITY)
Admission: EM | Admit: 2024-08-24 | Discharge: 2024-08-24 | Disposition: A | Discharge status: Home / Self Care | Attending: Emergency Medicine | Admitting: Emergency Medicine

## 2024-08-24 ENCOUNTER — Emergency Department (HOSPITAL_COMMUNITY)

## 2024-08-24 ENCOUNTER — Encounter (HOSPITAL_COMMUNITY): Payer: Self-pay | Admitting: Emergency Medicine

## 2024-08-24 DIAGNOSIS — M25519 Pain in unspecified shoulder: Secondary | ICD-10-CM | POA: Diagnosis not present

## 2024-08-24 DIAGNOSIS — N189 Chronic kidney disease, unspecified: Secondary | ICD-10-CM | POA: Diagnosis not present

## 2024-08-24 DIAGNOSIS — R4182 Altered mental status, unspecified: Secondary | ICD-10-CM | POA: Diagnosis not present

## 2024-08-24 DIAGNOSIS — E876 Hypokalemia: Secondary | ICD-10-CM | POA: Insufficient documentation

## 2024-08-24 DIAGNOSIS — D631 Anemia in chronic kidney disease: Secondary | ICD-10-CM | POA: Diagnosis not present

## 2024-08-24 DIAGNOSIS — I10 Essential (primary) hypertension: Secondary | ICD-10-CM | POA: Diagnosis not present

## 2024-08-24 DIAGNOSIS — R0602 Shortness of breath: Secondary | ICD-10-CM | POA: Insufficient documentation

## 2024-08-24 DIAGNOSIS — M19019 Primary osteoarthritis, unspecified shoulder: Secondary | ICD-10-CM | POA: Diagnosis not present

## 2024-08-24 LAB — RESP PANEL BY RT-PCR (RSV, FLU A&B, COVID)  RVPGX2
Influenza A by PCR: NEGATIVE
Influenza B by PCR: NEGATIVE
Resp Syncytial Virus by PCR: NEGATIVE
SARS Coronavirus 2 by RT PCR: NEGATIVE

## 2024-08-24 LAB — COMPREHENSIVE METABOLIC PANEL WITH GFR
ALT: 36 U/L (ref 0–44)
AST: 30 U/L (ref 15–41)
Albumin: 4.1 g/dL (ref 3.5–5.0)
Alkaline Phosphatase: 61 U/L (ref 38–126)
Anion gap: 14 (ref 5–15)
BUN: 20 mg/dL (ref 8–23)
CO2: 24 mmol/L (ref 22–32)
Calcium: 9.3 mg/dL (ref 8.9–10.3)
Chloride: 101 mmol/L (ref 98–111)
Creatinine, Ser: 1.13 mg/dL — ABNORMAL HIGH (ref 0.44–1.00)
GFR, Estimated: 49 mL/min — ABNORMAL LOW (ref >60)
Glucose, Bld: 108 mg/dL — ABNORMAL HIGH (ref 70–99)
Potassium: 3.3 mmol/L — ABNORMAL LOW (ref 3.5–5.1)
Sodium: 139 mmol/L (ref 135–145)
Total Bilirubin: 1 mg/dL (ref 0.0–1.2)
Total Protein: 7.2 g/dL (ref 6.5–8.1)

## 2024-08-24 LAB — CBC WITH DIFFERENTIAL/PLATELET
Abs Immature Granulocytes: 0.04 K/uL (ref 0.00–0.07)
Basophils Absolute: 0 K/uL (ref 0.0–0.1)
Basophils Relative: 1 %
Eosinophils Absolute: 0.1 K/uL (ref 0.0–0.5)
Eosinophils Relative: 2 %
HCT: 38.5 % (ref 36.0–46.0)
Hemoglobin: 13.3 g/dL (ref 12.0–15.0)
Immature Granulocytes: 1 %
Lymphocytes Relative: 19 %
Lymphs Abs: 1.3 K/uL (ref 0.7–4.0)
MCH: 36 pg — ABNORMAL HIGH (ref 26.0–34.0)
MCHC: 34.5 g/dL (ref 30.0–36.0)
MCV: 104.3 fL — ABNORMAL HIGH (ref 80.0–100.0)
Monocytes Absolute: 0.6 K/uL (ref 0.1–1.0)
Monocytes Relative: 9 %
Neutro Abs: 4.5 K/uL (ref 1.7–7.7)
Neutrophils Relative %: 68 %
Platelets: 204 K/uL (ref 150–400)
RBC: 3.69 MIL/uL — ABNORMAL LOW (ref 3.87–5.11)
RDW: 12 % (ref 11.5–15.5)
WBC: 6.6 K/uL (ref 4.0–10.5)
nRBC: 0 % (ref 0.0–0.2)

## 2024-08-24 LAB — AMMONIA: Ammonia: <13 umol/L (ref 9–35)

## 2024-08-24 LAB — URINALYSIS, W/ REFLEX TO CULTURE (INFECTION SUSPECTED)
Bacteria, UA: NONE SEEN
Bilirubin Urine: NEGATIVE
Glucose, UA: NEGATIVE mg/dL
Hgb urine dipstick: NEGATIVE
Ketones, ur: NEGATIVE mg/dL
Leukocytes,Ua: NEGATIVE
Nitrite: NEGATIVE
Protein, ur: NEGATIVE mg/dL
Specific Gravity, Urine: 1.015 (ref 1.005–1.030)
pH: 5 (ref 5.0–8.0)

## 2024-08-24 LAB — I-STAT CG4 LACTIC ACID, ED: Lactic Acid, Venous: 0.9 mmol/L (ref 0.5–1.9)

## 2024-08-24 LAB — TROPONIN I (HIGH SENSITIVITY)
Troponin I (High Sensitivity): 6 ng/L (ref <18)
Troponin I (High Sensitivity): 7 ng/L (ref <18)

## 2024-08-24 MED ORDER — OXYCODONE-ACETAMINOPHEN 5-325 MG PO TABS
1.0000 | ORAL_TABLET | Freq: Once | ORAL | Status: AC
  Administered 2024-08-24: 1 via ORAL
  Filled 2024-08-24: qty 1

## 2024-08-24 NOTE — ED Triage Notes (Signed)
 Pt to ED via [facility] from home c/o right shoulder pain that's been going on since yesterday morning. Pt slow to respond and crying when EMS arrived. Pt not following commands. Pt usually GCS 15 but GCS 13 at this time.Pt unable to walk on own. Pt does withdraw to painful stimuli. 12 lead unremarkable. BS: 107. 25 mcg fentanyl  given IV. 18 G in right hand. HX: osteoporosis LKN Wednesday night per husband/ems

## 2024-08-24 NOTE — ED Provider Notes (Signed)
 Patient signed out to me by PA Olam Slocumb.  Here for altered mental status.  Workup reassuring but patient still notably somnolent.  Planning to admit.  Polypharmacy is presumed etiology at this time. Physical Exam  BP (!) 117/59   Pulse 97   Temp 97.8 F (36.6 C) (Oral)   Resp 18   Ht 5' 1 (1.549 m)   SpO2 90%   BMI 26.24 kg/m   Physical Exam  Procedures  Procedures  ED Course / MDM   Clinical Course as of 08/24/24 0738  Fri Aug 24, 2024  0630 AMS. Goin on for 24 hours. Likely polypharm.  Celexa, baclofen, Wellbutrin , Ativan , trazodone . Mental status has improved. Admit.  [JR]    Clinical Course User Index [JR] Lang Norleen POUR, PA-C   Medical Decision Making Amount and/or Complexity of Data Reviewed Labs: ordered. Radiology: ordered.  Risk Prescription drug management. Decision regarding hospitalization.   On reassessment, patient was alert and oriented and expressing a very strong desire to be discharged.  She stated that if she was admitted that she would not stay.  She appears of sound mind at this time and no longer somnolent.  Mental status appears normal.  Advised her to follow-up with her PCP.  Also discussed with her that her medications could be contributing to her symptoms.  Discussed return precautions.  Discharged.        Lang Norleen POUR, PA-C 08/24/24 [id number]    Ula Prentice SAUNDERS, MD 08/24/24 [phone]

## 2024-08-24 NOTE — ED Notes (Signed)
 Patient refused brief change during discharge. Became aggressive to staff and wants to be discharged. Discharge paperwork and education provided, minimal learning observed. Needs met on discharge.

## 2024-08-24 NOTE — ED Provider Notes (Signed)
 [place] EMERGENCY DEPARTMENT AT [facility] Provider Note   CSN: [id number] Arrival date & time: 08/24/24  9770     Patient presents with: Altered Mental Status and Shoulder Pain   Danielle Gaines is a 83 y.o. female.   The history is provided by the patient and medical records.  Altered Mental Status Shoulder Pain  83 y.o. F with hx of anemia, chronic kidney disease, GERD, arthritis, migraine headaches, depression, restless leg, presenting to the ED for altered mental status.  Patient accompanied by husband at bedside who is providing additional history.  States she was last in normal state of health Wednesday evening.  Since that time she has been far from her baseline.  States she has been requiring help to get up from a chair or out of bed which is not typical for her.  She has been crying and seems to have a lot of pain in her right shoulder.  There has not been any falls/trauma reported.  This evening after helping her to and from bathroom she began complaining of feeling SOB so EMS was called. Husband reports she has not really eaten much this week besides peanut butter sandwiches.  She does drink a lot of water  routinely and supplements with muscle milk.  No vomiting or diarrhea.  No voiced abdominal pain.  No recent medication changes or new medications introduced.  Prior to Admission medications   Medication Sig Start Date End Date Taking? Authorizing Provider  acetaminophen  (TYLENOL ) 500 MG tablet Take 1,000 mg by mouth daily as needed for headache.    [provider]  amLODipine  (NORVASC ) 2.5 MG tablet Take 1 tablet (2.5 mg total) by mouth daily. 11/08/23 06/26/24  Burnard Debby LABOR, MD  amphetamine-dextroamphetamine (ADDERALL XR) 20 MG 24 hr capsule Take 20 mg by mouth daily. 05/09/24   [provider]  Atogepant (QULIPTA) 60 MG TABS Take 60 mg by mouth daily. 06/02/22   [provider]  atorvastatin  (LIPITOR) 40 MG tablet Take 1 tablet (40 mg  total) by mouth daily. 11/08/23 06/26/24  Burnard Debby LABOR, MD  baclofen (LIORESAL) 10 MG tablet Take 10 mg by mouth at bedtime as needed.    [provider]  buPROPion  (WELLBUTRIN  XL) 150 MG 24 hr tablet Take 150 mg by mouth every morning. 06/20/24   [provider]  citalopram (CELEXA) 40 MG tablet Take 40 mg by mouth daily. 02/21/24   [provider]  CREON  36000-114000 units CPEP capsule Take 36,000 Units by mouth 3 (three) times daily.    [provider]  cycloSPORINE  (RESTASIS ) 0.05 % ophthalmic emulsion Place 1 drop into both eyes 2 (two) times daily. 10/09/19   [provider]  Dorzolamide HCl-Timolol Mal PF 2-0.5 % SOLN INSTILL 1 DROP IN BOTH EYES TWICE DAILY Patient taking differently: Place 1 drop into both eyes daily. 11/28/23   Danielle Heron HERO, MD  folic acid (FOLVITE) 1 MG tablet Take by mouth. 12/21/19   [provider]  furosemide  (LASIX ) 40 MG tablet Take 40 mg by mouth daily.    [provider]  LORazepam  (ATIVAN ) 0.5 MG tablet Take 0.5 mg by mouth in the morning, at noon, and at bedtime. 1 in am 1 at midday 2 at hs Patient taking differently: Take 0.5 mg by mouth 2 (two) times daily. 1 in am 1 at midday 2 at hs    [provider]  meclizine  (ANTIVERT ) 25 MG tablet Take 1 tablet (25 mg total) by  mouth 3 (three) times daily as needed for dizziness. 06/26/24   Danielle Heron HERO, MD  neomycin-polymyxin b-dexamethasone  (MAXITROL) 3.5-10000-0.1 OINT  10/31/22   [provider]  olopatadine  (PATANOL) 0.1 % ophthalmic solution 1 drop 2 (two) times daily.    [provider]  Probiotic Product (ALIGN PO) Take by mouth daily.    [provider]  sodium bicarbonate  650 MG tablet Take 650 mg by mouth 2 (two) times daily. 05/30/24   [provider]  Travoprost, BAK Free, (TRAVATAN) 0.004 % SOLN ophthalmic solution 1 drop at bedtime. 08/17/21   [provider]  traZODone  (DESYREL )  50 MG tablet Take 100 mg by mouth at bedtime as needed.    [provider]  UBRELVY 100 MG TABS  12/14/19   [provider]    Allergies: Elemental sulfur, Latanoprost, and Sulfa antibiotics    Review of Systems  Respiratory:  Positive for shortness of breath.   Musculoskeletal:  Positive for arthralgias.  All other systems reviewed and are negative.   Updated Vital Signs BP 136/75   Pulse 96   Temp 99.1 F (37.3 C) (Oral)   Resp 15   Ht 5' 1 (1.549 m)   SpO2 93%   BMI 26.24 kg/m   Physical Exam Vitals and nursing note reviewed.  Constitutional:      Appearance: She is well-developed.     Comments: Warm to touch  HENT:     Head: Normocephalic and atraumatic.     Comments: No visible head trauma Eyes:     Conjunctiva/sclera: Conjunctivae normal.     Pupils: Pupils are equal, round, and reactive to light.     Comments: Eyes are red (recently crying)  Cardiovascular:     Rate and Rhythm: Normal rate and regular rhythm.     Heart sounds: Normal heart sounds.  Pulmonary:     Effort: Pulmonary effort is normal.     Breath sounds: Normal breath sounds.  Abdominal:     General: Bowel sounds are normal.     Palpations: Abdomen is soft.  Musculoskeletal:        General: Normal range of motion.     Cervical back: Normal range of motion.     Comments: Right shoulder normal in appearance, no appreciable deformities, no overlying skin changes or erythema, non-tender on palpation   Skin:    General: Skin is warm and dry.  Neurological:     Comments: Awake but somnolent, not providing any verbal responses or answering any questions, following very limited commands     (all labs ordered are listed, but only abnormal results are displayed) Labs Reviewed  CBC WITH DIFFERENTIAL/PLATELET - Abnormal; Notable for the following components:      Result Value   RBC 3.69 (*)    MCV 104.3 (*)    MCH 36.0 (*)    All other components within normal limits   COMPREHENSIVE METABOLIC PANEL WITH GFR - Abnormal; Notable for the following components:   Potassium 3.3 (*)    Glucose, Bld 108 (*)    Creatinine, Ser 1.13 (*)    GFR, Estimated 49 (*)    All other components within normal limits  CULTURE, BLOOD (ROUTINE X 2)  CULTURE, BLOOD (ROUTINE X 2)  RESP PANEL BY RT-PCR (RSV, FLU A&B, COVID)  RVPGX2  AMMONIA  URINALYSIS, W/ REFLEX TO CULTURE (INFECTION SUSPECTED)  I-STAT CG4 LACTIC ACID, ED  TROPONIN I (HIGH SENSITIVITY)  TROPONIN I (HIGH SENSITIVITY)    EKG:  None  Radiology: DG Shoulder Right Result Date: 08/24/2024 EXAM: 1 VIEW(S) XRAY OF THE SHOULDER 08/24/2024 04:36:20 AM COMPARISON: None available. CLINICAL HISTORY: AMS, shoulder pain FINDINGS: BONES AND JOINTS: Glenohumeral joint is normally aligned. No acute fracture or dislocation. Mild narrowing of acromioclavicular joint with small marginal osteophytes. SOFT TISSUES: No abnormal calcifications. Visualized lung is unremarkable. IMPRESSION: 1. No acute osseous abnormality. 2. Mild ac joint osteoarthritis. Electronically signed by: Danielle Calk MD 08/24/2024 05:55 AM EDT RP Workstation: [id number]   DG Chest 2 View Result Date: 08/24/2024 EXAM: 2 VIEW(S) XRAY OF THE CHEST 08/24/2024 04:35:53 AM COMPARISON: 10/05/2023 CLINICAL HISTORY: AMS, shoulder pain. FINDINGS: LUNGS AND PLEURA: No focal pulmonary opacity. No pulmonary edema. No pleural effusion. No pneumothorax. HEART AND MEDIASTINUM: Aortic atherosclerosis. No acute abnormality of the cardiac and mediastinal silhouettes. BONES AND SOFT TISSUES: Thoracic degenerative changes. IMPRESSION: 1. No acute cardiopulmonary process. 2. Aortic atherosclerosis. Electronically signed by: Danielle Calk MD 08/24/2024 05:54 AM EDT RP Workstation: [id number]   CT Head Wo Contrast Result Date: 08/24/2024 CLINICAL DATA:  Altered mental status EXAM: CT HEAD WITHOUT CONTRAST TECHNIQUE: Contiguous axial images were obtained from the base of the skull  through the vertex without intravenous contrast. RADIATION DOSE REDUCTION: This exam was performed according to the departmental dose-optimization program which includes automated exposure control, adjustment of the mA and/or kV according to patient size and/or use of iterative reconstruction technique. COMPARISON:  06/29/2018 FINDINGS: Brain: Mild age related volume loss. No acute intracranial abnormality. Specifically, no hemorrhage, hydrocephalus, mass lesion, acute infarction, or significant intracranial injury. Vascular: No hyperdense vessel or unexpected calcification. Skull: No acute calvarial abnormality. Sinuses/Orbits: No acute findings Other: None IMPRESSION: No acute intracranial abnormality. Electronically Signed   By: Franky Crease M.D.   On: 08/24/2024 03:43     Procedures   Medications Ordered in the ED  oxyCODONE -acetaminophen  (PERCOCET/ROXICET) 5-325 MG per tablet 1 tablet (1 tablet Oral Given 08/24/24 0406)                                  Medical Decision Making Amount and/or Complexity of Data Reviewed Labs: ordered. Radiology: ordered and independent interpretation performed. ECG/medicine tests: ordered and independent interpretation performed.  Risk Prescription drug management. Decision regarding hospitalization.   83 year old female presenting to the ED with altered mental status.  Went to bed as normal Wednesday evening, altered all day yesterday.  Per husband at bedside, seems more generally weak and did complain of some shortness of breath yesterday.  Has had poor intake.  Normally she is ambulatory and very independent so this is far from her baseline.  Also has been complaining of a lot of right shoulder pain but no falls or trauma.  Patient is awake here but she is not providing any verbal responses and is following limited commands.  She did start to arouse more throughout exam and obtaining labs.  Does endorse right shoulder pain.  She does feel warm to the  touch but no noted fevers at home.  Differential is broad.  Labs, CT, and x-rays are pending.  Labs as above--normal lactate, no leukocytosis.  No significant electrolyte derangement.  Potassium is 3.3.  Normal renal function.  Ammonia is normal.  Troponin WNL.  UA without any signs of infection.  CT head is negative.  CXR without acute findings.  Shoulder films with some arthritic changes but no acute bony findings.    Unclear etiology of her  altered mental status at this time.  Consider polypharmacy as she is on multiple sedating medications including Celexa, baclofen, Wellbutrin , Ativan , trazodone , etc.  Has improved a little here.  She is able to tolerate PO.  As she is still not at baseline, will require admission.  Discussed with husband who is agreeable to admission.  6:40 AM  At shift change still waiting for call back from hospitalist (for over an hour at this point).  Care signed out pending admission.  Final diagnoses:  Altered mental status, unspecified altered mental status type    ED Discharge Orders     None          Jarold Olam HERO, PA-C 08/24/24 0640    Theadore Danielle HERO, MD 08/24/24 [phone]

## 2024-08-24 NOTE — Discharge Instructions (Addendum)
 Evaluation today was reassuring.  Please follow-up with your PCP.  If your symptoms return please return to the ED for further evaluation.  As we discussed some of your medications could be contributing to your symptoms at home.

## 2024-08-27 ENCOUNTER — Ambulatory Visit: Payer: Self-pay

## 2024-08-27 ENCOUNTER — Emergency Department (HOSPITAL_COMMUNITY)
Admission: EM | Admit: 2024-08-27 | Discharge: 2024-08-27 | Disposition: A | Discharge status: Home / Self Care | Attending: Emergency Medicine | Admitting: Emergency Medicine

## 2024-08-27 ENCOUNTER — Emergency Department (HOSPITAL_COMMUNITY)

## 2024-08-27 ENCOUNTER — Other Ambulatory Visit: Payer: Self-pay

## 2024-08-27 DIAGNOSIS — N183 Chronic kidney disease, stage 3 unspecified: Secondary | ICD-10-CM | POA: Diagnosis not present

## 2024-08-27 DIAGNOSIS — M79642 Pain in left hand: Secondary | ICD-10-CM | POA: Insufficient documentation

## 2024-08-27 DIAGNOSIS — Z79899 Other long term (current) drug therapy: Secondary | ICD-10-CM | POA: Insufficient documentation

## 2024-08-27 DIAGNOSIS — M79641 Pain in right hand: Secondary | ICD-10-CM | POA: Insufficient documentation

## 2024-08-27 DIAGNOSIS — R531 Weakness: Secondary | ICD-10-CM | POA: Diagnosis not present

## 2024-08-27 DIAGNOSIS — M79673 Pain in unspecified foot: Secondary | ICD-10-CM | POA: Diagnosis not present

## 2024-08-27 DIAGNOSIS — R519 Headache, unspecified: Secondary | ICD-10-CM | POA: Diagnosis not present

## 2024-08-27 DIAGNOSIS — I10 Essential (primary) hypertension: Secondary | ICD-10-CM

## 2024-08-27 DIAGNOSIS — R030 Elevated blood-pressure reading, without diagnosis of hypertension: Secondary | ICD-10-CM | POA: Insufficient documentation

## 2024-08-27 DIAGNOSIS — R059 Cough, unspecified: Secondary | ICD-10-CM | POA: Diagnosis present

## 2024-08-27 DIAGNOSIS — I129 Hypertensive chronic kidney disease with stage 1 through stage 4 chronic kidney disease, or unspecified chronic kidney disease: Secondary | ICD-10-CM | POA: Diagnosis not present

## 2024-08-27 DIAGNOSIS — R42 Dizziness and giddiness: Secondary | ICD-10-CM | POA: Insufficient documentation

## 2024-08-27 DIAGNOSIS — G319 Degenerative disease of nervous system, unspecified: Secondary | ICD-10-CM | POA: Diagnosis not present

## 2024-08-27 LAB — CBC
HCT: 39.7 % (ref 36.0–46.0)
Hemoglobin: 13.5 g/dL (ref 12.0–15.0)
MCH: 35.2 pg — ABNORMAL HIGH (ref 26.0–34.0)
MCHC: 34 g/dL (ref 30.0–36.0)
MCV: 103.7 fL — ABNORMAL HIGH (ref 80.0–100.0)
Platelets: 255 K/uL (ref 150–400)
RBC: 3.83 MIL/uL — ABNORMAL LOW (ref 3.87–5.11)
RDW: 11.7 % (ref 11.5–15.5)
WBC: 6 K/uL (ref 4.0–10.5)
nRBC: 0 % (ref 0.0–0.2)

## 2024-08-27 LAB — TSH: TSH: 1.47 u[IU]/mL (ref 0.350–4.500)

## 2024-08-27 LAB — URINALYSIS, ROUTINE W REFLEX MICROSCOPIC
Bilirubin Urine: NEGATIVE
Glucose, UA: NEGATIVE mg/dL
Hgb urine dipstick: NEGATIVE
Ketones, ur: NEGATIVE mg/dL
Leukocytes,Ua: NEGATIVE
Nitrite: NEGATIVE
Protein, ur: NEGATIVE mg/dL
Specific Gravity, Urine: 1.01 (ref 1.005–1.030)
pH: 6 (ref 5.0–8.0)

## 2024-08-27 LAB — COMPREHENSIVE METABOLIC PANEL WITH GFR
ALT: 25 U/L (ref 0–44)
AST: 25 U/L (ref 15–41)
Albumin: 4.4 g/dL (ref 3.5–5.0)
Alkaline Phosphatase: 69 U/L (ref 38–126)
Anion gap: 14 (ref 5–15)
BUN: 20 mg/dL (ref 8–23)
CO2: 23 mmol/L (ref 22–32)
Calcium: 10 mg/dL (ref 8.9–10.3)
Chloride: 96 mmol/L — ABNORMAL LOW (ref 98–111)
Creatinine, Ser: 0.94 mg/dL (ref 0.44–1.00)
GFR, Estimated: >60 mL/min (ref >60)
Glucose, Bld: 112 mg/dL — ABNORMAL HIGH (ref 70–99)
Potassium: 3.4 mmol/L — ABNORMAL LOW (ref 3.5–5.1)
Sodium: 133 mmol/L — ABNORMAL LOW (ref 135–145)
Total Bilirubin: 0.5 mg/dL (ref 0.0–1.2)
Total Protein: 7.6 g/dL (ref 6.5–8.1)

## 2024-08-27 LAB — TROPONIN T, HIGH SENSITIVITY
Troponin T High Sensitivity: 19 ng/L (ref 0–19)
Troponin T High Sensitivity: 16 ng/L (ref 0–19)

## 2024-08-27 LAB — RESP PANEL BY RT-PCR (RSV, FLU A&B, COVID)  RVPGX2
Influenza A by PCR: NEGATIVE
Influenza B by PCR: NEGATIVE
Resp Syncytial Virus by PCR: NEGATIVE
SARS Coronavirus 2 by RT PCR: NEGATIVE

## 2024-08-27 LAB — CBG MONITORING, ED: Glucose-Capillary: 110 mg/dL — ABNORMAL HIGH (ref 70–99)

## 2024-08-27 MED ORDER — AMLODIPINE BESYLATE 5 MG PO TABS
5.0000 mg | ORAL_TABLET | Freq: Once | ORAL | Status: AC
  Administered 2024-08-27: 5 mg via ORAL
  Filled 2024-08-27: qty 1

## 2024-08-27 MED ORDER — SODIUM CHLORIDE 0.9 % IV BOLUS
1000.0000 mL | Freq: Once | INTRAVENOUS | Status: AC
  Administered 2024-08-27: 1000 mL via INTRAVENOUS

## 2024-08-27 MED ORDER — AMLODIPINE BESYLATE 5 MG PO TABS: 5.0000 mg | ORAL_TABLET | Freq: Every day | ORAL | 0 refills | Status: DC

## 2024-08-27 NOTE — ED Triage Notes (Signed)
 Patient c/o hypertension (200/100) and dizziness at home. Patient report she usually don't take BP medication at home. Patient report headache. Patient report nausea , denies vomiting. Patient denies blurred vision.

## 2024-08-27 NOTE — Telephone Encounter (Signed)
 FYI Only or Action Required?: FYI only for provider.  Patient was last seen in primary care on 06/26/2024 by Ozell Heron HERO, MD.  Called Nurse Triage reporting Hypertension.  Symptoms began today.  Interventions attempted: Prescription medications: has taken blood pressure medication as prescribed.  Symptoms are: gradually worsening.  Triage Disposition: Go to ED Now (Notify PCP)  Patient/caregiver understands and will follow disposition?: Yes      Copied from CRM [id number]. Topic: Clinical - Red Word Triage >> Aug 27, 2024  2:40 PM Taleah C wrote: Red Word that prompted transfer to Nurse Triage: high bp, feeling weak        Reason for Disposition  [1] Systolic BP >= 160 OR Diastolic >= 100 AND [2] cardiac (e.g., breathing difficulty, chest pain) or neurologic symptoms (e.g., new-onset blurred or double vision, unsteady gait)  Answer Assessment - Initial Assessment Questions 1. BLOOD PRESSURE: What is your blood pressure? Did you take at least two measurements 5 minutes apart?     212/110 2. ONSET: When did you take your blood pressure?     Today  3. HOW: How did you take your blood pressure? (e.g., automatic home BP monitor, visiting nurse)     Automatic BP cuff  4. HISTORY: Do you have a history of high blood pressure?     Yes 5. MEDICINES: Are you taking any medicines for blood pressure? Have you missed any doses recently?     Has not missed any medication  6. OTHER SYMPTOMS: Do you have any symptoms? (e.g., blurred vision, chest pain, difficulty breathing, headache, weakness)     Weakness, dizziness  Protocols used: Blood Pressure - High-A-AH

## 2024-08-27 NOTE — ED Notes (Signed)
 Ambulated patient to the bathroom 1 assist. Patient was dizzy going and coming back from the bathroom. Will try a bedpan next time

## 2024-08-27 NOTE — Discharge Instructions (Signed)
 While you were in the emergency room, your blood work done and overall was normal.  You CT and a chest x-ray that were normal.  Your urine does not have any infection.  At this time, I do not have a clear cause for your symptoms but it does not seem to be an emergency at this time.  I would like you to begin taking amlodipine , 5 mg each day to help out with your blood pressure.  Please call your primary care doctor tomorrow to set up a follow-up appointment.  Had a head

## 2024-08-27 NOTE — ED Provider Notes (Signed)
 [place] EMERGENCY DEPARTMENT AT [facility] Provider Note  CSN: [id number] Arrival date & time: 08/27/24 1553  Chief Complaint(s) Hypertension and Dizziness  HPI SHEANA BIR is a 83 y.o. female who is here today because she noted hypotension at home.  Patient reports over last couple of days, she had been feeling unwell.  She describes pain in her hands, and her feet.  She says that her skin is sensitive to the touch.  She says at that time she has felt like she has chills.  Patient took her blood pressure today, noticed her systolic pressure was 200.  She typically has a low blood pressure.  Started to have a headache.  She came to the ED.   Past Medical History Past Medical History:  Diagnosis Date   Anemia    Anxiety    Arthritis    Benzodiazepine dependence (HCC)    Carotid arterial disease    mild   Chronic kidney disease (CKD), stage III (moderate) (HCC) 08/01/2018   -seeing  Nephrology, Dr. 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 Vincente   Diastolic heart failure [facility])    mild on echo 2016 no cardiologist   GERD (gastroesophageal reflux disease)    IBS (irritable bowel syndrome)    sees Dr. Luis   Macular degeneration    goes to [facility] for this   Migraine    managed by neurologist   Osteoporosis    Sty    right eye   Uvulitis    Vertigo    seeing  dr bates for [facility] 08-11-2021 epic   Wears glasses    for reading   Wears hearing aid in both ears    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   Contact dermatitis 09/10/2021   Hand eczema 09/10/2021   Elective procedure for unacceptable cosmetic appearance 09/10/2021   Lipoma 09/10/2021   Peripheral  venous insufficiency 09/10/2021   Pruritus, unspecified 09/10/2021   Low back pain 07/16/2021   Hip pain, bilateral 06/11/2021   Myalgia 04/08/2021   Vasomotor rhinitis 11/04/2020   Referred otalgia of both ears 07/08/2020   Right ear pain 05/09/2020   Bilateral impacted cerumen 09/13/2019   Excessive sweating 06/13/2019   Anterior epistaxis 08/14/2018   Aphthous ulcer 08/14/2018   Dysfunction of both eustachian tubes 08/14/2018   Recurrent epistaxis 08/14/2018   Chronic kidney disease (CKD), stage III (moderate) (HCC) 08/01/2018   Anemia, chronic renal failure 08/01/2018   Episcleritis of left eye 02/23/2018   New daily persistent headache 01/10/2018   Temporal arteritis (HCC) 01/10/2018   Bilateral temporomandibular joint pain 01/10/2018   Constipation 10/18/2017   Sensorineural hearing loss (SNHL), bilateral 09/04/2017   Hearing loss 09/01/2017   Tremor of both hands 09/01/2017   Postural dizziness 09/01/2017   Panuveitis of both eyes 02/01/2017   Chronic pain 11/19/2016   Orthostatic hypotension 11/19/2016   Retinal edema 01/13/2016   Epiretinal membrane (ERM) of right eye 12/30/2015   MDD (major depressive disorder), recurrent severe, without psychosis (HCC) 04/09/2015   Osteoporosis 07/24/2014   Cystoid macular edema, right eye 07/05/2014   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 Authorizing Provider  acetaminophen  (TYLENOL ) 500 MG tablet Take 1,000 mg by mouth daily as needed for headache.    [provider]  amLODipine  (NORVASC ) 2.5 MG tablet Take 1 tablet (2.5 mg total) by mouth daily. 11/08/23 06/26/24  Burnard Debby LABOR, MD  amphetamine-dextroamphetamine (ADDERALL XR) 20 MG 24 hr capsule Take 20 mg by mouth daily. 05/09/24   [provider]  Atogepant (QULIPTA) 60 MG TABS Take 60 mg by mouth daily. 06/02/22   [provider]  atorvastatin  (LIPITOR) 40 MG tablet Take 1 tablet (40 mg total) by mouth daily. 11/08/23 06/26/24  Burnard Debby LABOR, MD  baclofen (LIORESAL) 10 MG tablet Take 10 mg by mouth at bedtime as needed.    [provider]  buPROPion  (WELLBUTRIN  XL) 150 MG 24 hr tablet Take 150 mg by mouth every morning. 06/20/24   [provider]  citalopram (CELEXA) 40 MG tablet Take 40 mg by mouth daily. 02/21/24   [provider]  CREON  36000-114000 units CPEP capsule Take 36,000 Units by mouth 3 (three) times daily.    [provider]  cycloSPORINE  (RESTASIS ) 0.05 % ophthalmic emulsion Place 1 drop into both eyes 2 (two) times daily. 10/09/19   [provider]  Dorzolamide HCl-Timolol Mal PF 2-0.5 % SOLN INSTILL 1 DROP IN BOTH EYES TWICE DAILY Patient taking differently: Place 1 drop into both eyes daily. 11/28/23   Ozell Heron HERO, MD  famotidine (PEPCID) 40 MG tablet Take 40 mg by mouth daily. 06/14/24   [provider]  folic acid (FOLVITE) 1 MG tablet Take by mouth. 12/21/19   [provider]  furosemide  (LASIX ) 20 MG tablet Take 20 mg by mouth daily. 08/16/24   [provider]  hyoscyamine  (ANASPAZ ) 0.125 MG TBDP disintergrating tablet Place 0.125 mg under the tongue. 07/23/24   [provider]  LORazepam  (ATIVAN ) 0.5 MG tablet Take 0.5 mg by mouth in the  morning, at noon, and at bedtime. 1 in am 1 at midday 2 at hs Patient taking differently: Take 0.5 mg by mouth 2 (two) times daily. 1 in am 1 at midday 2 at hs    [provider]  meclizine  (ANTIVERT ) 25 MG tablet Take 1 tablet (25 mg total) by mouth 3 (three) times daily as needed for dizziness. 06/26/24   Ozell Heron HERO, MD  neomycin-polymyxin b-dexamethasone  (MAXITROL) 3.5-10000-0.1 OINT  10/31/22   [provider]  olopatadine  (PATANOL) 0.1 % ophthalmic solution 1 drop 2 (two) times daily.    [provider]  omeprazole (PRILOSEC) 40 MG capsule Take 40 mg by mouth 2 (two) times daily. 06/10/24   [provider]  Probiotic Product (ALIGN PO) Take by mouth daily.    [provider]  sodium bicarbonate  650 MG tablet Take 650 mg by mouth 2 (two) times daily.  05/30/24   [provider]  tamsulosin  (FLOMAX ) 0.4 MG CAPS capsule Take 0.4 mg by mouth daily. 08/23/24   [provider]  Travoprost, BAK Free, (TRAVATAN) 0.004 % SOLN ophthalmic solution 1 drop at bedtime. 08/17/21   [provider]  traZODone  (DESYREL ) 50 MG tablet Take 100 mg by mouth at bedtime as needed.    [provider]  UBRELVY 100 MG TABS  12/14/19   [provider]  Vitamin D , Ergocalciferol , (DRISDOL) 1.25 MG (50000 UNIT) CAPS capsule Take 50,000 Units by mouth once a week. 07/24/24   [provider]                                                                                                                                    Past Surgical History Past Surgical History:  Procedure Laterality Date   ABDOMINAL ADHESION SURGERY     ABDOMINAL HYSTERECTOMY  1972   APPENDECTOMY     CHOLECYSTECTOMY  1996   colonscopy  1989   CYSTOSCOPY W/ URETERAL STENT PLACEMENT Right 04/27/2016   Procedure: CYSTOSCOPY WITH RETROGRADE PYELOGRAM/ RIGHT URETERAL STENT PLACEMENT;  Surgeon: Morene LELON Salines, MD;  Location: [facility];  Service: Urology;   Laterality: Right;   CYSTOSCOPY WITH RETROGRADE PYELOGRAM, URETEROSCOPY AND STENT PLACEMENT Left 10/09/2021   Procedure: CYSTOSCOPY WITH LEFT RETROGRADE URETEROSCOPY WITH HOLMIUM LASER AND STENT PLACEMENT;  Surgeon: Watt Rush, MD;  Location: [facility];  Service: Urology;  Laterality: Left;   ESOPHAGOGASTRODUODENOSCOPY     2002, 2011, 2014, 2021   EXTRACORPOREAL SHOCK WAVE LITHOTRIPSY Left 08/31/2021   Procedure: LEFT EXTRACORPOREAL SHOCK WAVE LITHOTRIPSY (ESWL);  Surgeon: Salines Morene LELON, MD;  [facility];  Service: Urology;  Laterality: Left;   FLEXIBLE SIGMOIDOSCOPY     1991, 1994, 2006, 2011, 2021   HOLMIUM LASER APPLICATION Left 10/09/2021   Procedure: HOLMIUM LASER APPLICATION;  Surgeon: Watt Rush, MD;  Location: [facility];  Service: Urology;  Laterality: Left;   lasix  eye surgery     ou   MEMBRANECTOMY Left 09/21/2013   OVARIAN CYST SURGERY     PARS PLANA VITRECTOMY Left 09/21/2013   SMALL INTESTINE SURGERY     SUBTOTAL COLECTOMY     1991, 1994, 2006, 2011   tumor removal right shoulder  06/22/2021   Family History Family History  Problem Relation Age of Onset   Kidney disease Mother    Heart disease Mother    Osteoporosis Mother    Heart Problems Sister        pacemaker   Cancer Maternal Aunt        intestinal and liver cancer   Stroke Maternal Grandmother    Heart disease Maternal Grandmother    Stroke Other    Depression Other        everyone   Hypercalcemia Neg Hx    Breast cancer Neg Hx  Social History Social History   Tobacco Use   Smoking status: Never   Smokeless tobacco: Never  Vaping Use   Vaping status: Never Used  Substance Use Topics   Alcohol use: No   Drug use: No   Allergies Elemental sulfur, Latanoprost, and Sulfa antibiotics  Review of Systems Review of Systems  Physical Exam Vital Signs  I have reviewed the triage vital signs BP (!) 163/86 (BP Location: Left Arm)    Pulse 94   Temp 99.1 F (37.3 C) (Oral)   Resp 14   Ht 5' 1 (1.549 m)   Wt 60.8 kg   SpO2 97%   BMI 25.32 kg/m   Physical Exam Vitals and nursing note reviewed.  HENT:     Head: Normocephalic.  Eyes:     Pupils: Pupils are equal, round, and reactive to light.  Cardiovascular:     Rate and Rhythm: Normal rate.  Pulmonary:     Effort: Pulmonary effort is normal.  Abdominal:     General: Abdomen is flat.  Musculoskeletal:        General: Normal range of motion.     Cervical back: Normal range of motion.  Skin:    General: Skin is warm.     Findings: No rash.  Neurological:     General: No focal deficit present.     Mental Status: She is alert.     ED Results and Treatments Labs (all labs ordered are listed, but only abnormal results are displayed) Labs Reviewed  COMPREHENSIVE METABOLIC PANEL WITH GFR - Abnormal; Notable for the following components:      Result Value   Sodium 133 (*)    Potassium 3.4 (*)    Chloride 96 (*)    Glucose, Bld 112 (*)    All other components within normal limits  CBC - Abnormal; Notable for the following components:   RBC 3.83 (*)    MCV 103.7 (*)    MCH 35.2 (*)    All other components within normal limits  CBG MONITORING, ED - Abnormal; Notable for the following components:   Glucose-Capillary 110 (*)    All other components within normal limits  RESP PANEL BY RT-PCR (RSV, FLU A&B, COVID)  RVPGX2  URINALYSIS, ROUTINE W REFLEX MICROSCOPIC  HEMOGLOBIN A1C  TSH  TROPONIN T, HIGH SENSITIVITY  TROPONIN T, HIGH SENSITIVITY                                                                                                                          Radiology CT Head Wo Contrast Result Date: 08/27/2024 CLINICAL DATA:  Headache with dizziness and hypertension. EXAM: CT HEAD WITHOUT CONTRAST TECHNIQUE: Contiguous axial images were obtained from the base of the skull through the vertex without intravenous contrast. RADIATION DOSE  REDUCTION: This exam was performed according to the departmental dose-optimization program which includes automated exposure control, adjustment of the mA and/or kV according to patient size and/or use of iterative reconstruction  technique. COMPARISON:  August 24, 2024 FINDINGS: Brain: There is generalized cerebral atrophy with widening of the extra-axial spaces and ventricular dilatation. There are areas of decreased attenuation within the white matter tracts of the supratentorial brain, consistent with microvascular disease changes. Vascular: No hyperdense vessel or unexpected calcification. Skull: A chronic fracture deformity is seen involving the left zygomatic arch. A small fixation plate and screws are seen along the lateral wall of the left orbit. Sinuses/Orbits: Postoperative changes are seen involving the lateral aspect of the right globe. Other: None. IMPRESSION: 1. Generalized cerebral atrophy with chronic white matter small vessel ischemic changes. 2. No acute intracranial abnormality. 3. Chronic fracture deformity of the left zygomatic arch. 4. Postoperative changes involving the lateral aspect of the right globe. Electronically Signed   By: Suzen Dials M.D.   On: 08/27/2024 17:29    Pertinent labs & imaging results that were available during my care of the patient were reviewed by me and considered in my medical decision making (see MDM for details).  Medications Ordered in ED Medications - No data to display                                                                                                                                   Procedures Procedures  (including critical care time)  Medical Decision Making / ED Course   This patient presents to the ED for concern of high blood pressure, headache, skin pain, chills, this involves an extensive number of treatment options, and is a complaint that carries with it a high risk of complications and morbidity.  The differential  diagnosis includes hypertension, UTI, viral syndrome, hyperthyroid, diabetes, peripheral neuropathy. MDM: Patient's blood pressures come down into a more normal range although mildly elevated.  She tells me that she typically has an issue with hypotension.  She had a CT done at triage which per my independent review shows no intracranial hemorrhage.  Will hold off on aggressive management as this may just be transient hypertension for the patient.  Regarding the patient's report of chills, will obtain a chest x-ray, COVID, urinalysis.  Patient's skin exam is overall normal. Appreciate any rash.  She has no neurological deficits.  TSH and A1c ordered to help facilitate PCP workup.  Reassessment 9:30 PM-patient's urinalysis negative for infection.  TSH is normal.  Patient overall looks well.  I do not a clear cause for her symptoms but does not appear To be an acute emergent cause at this time.  Will start the patient on 5 of amlodipine  and have her follow-up with her PCP.  Additional history obtained: -Additional history obtained from husband at bedside -External records from outside source obtained and reviewed including: Chart review including previous notes, labs, imaging, consultation notes   Lab Tests: -I ordered, reviewed, and interpreted labs.   The pertinent results include:   Labs Reviewed  COMPREHENSIVE METABOLIC PANEL WITH GFR - Abnormal; Notable  for the following components:      Result Value   Sodium 133 (*)    Potassium 3.4 (*)    Chloride 96 (*)    Glucose, Bld 112 (*)    All other components within normal limits  CBC - Abnormal; Notable for the following components:   RBC 3.83 (*)    MCV 103.7 (*)    MCH 35.2 (*)    All other components within normal limits  CBG MONITORING, ED - Abnormal; Notable for the following components:   Glucose-Capillary 110 (*)    All other components within normal limits  RESP PANEL BY RT-PCR (RSV, FLU A&B, COVID)  RVPGX2  URINALYSIS,  ROUTINE W REFLEX MICROSCOPIC  HEMOGLOBIN A1C  TSH  TROPONIN T, HIGH SENSITIVITY  TROPONIN T, HIGH SENSITIVITY      EKG left bundle branch block, unchanged from prior.  EKG Interpretation Date/Time:    Ventricular Rate:    PR Interval:    QRS Duration:    QT Interval:    QTC Calculation:   R Axis:      Text Interpretation:           Imaging Studies ordered: I ordered imaging studies including x-ray, head CT I independently visualized and interpreted imaging. I agree with the radiologist interpretation   Medicines ordered and prescription drug management: No orders of the defined types were placed in this encounter.   -I have reviewed the patients home medicines and have made adjustments as needed    Cardiac Monitoring: The patient was maintained on a cardiac monitor.  I personally viewed and interpreted the cardiac monitored which showed an underlying rhythm of: Normal sinus rhythm  Social Determinants of Health:  Factors impacting patients care include: Access to primary care   Reevaluation: After the interventions noted above, I reevaluated the patient and found that they have :improved  Co morbidities that complicate the patient evaluation  Past Medical History:  Diagnosis Date   Anemia    Anxiety    Arthritis    Benzodiazepine dependence (HCC)    Carotid arterial disease    mild   Chronic kidney disease (CKD), stage III (moderate) (HCC) 08/01/2018   -seeing  Nephrology, Dr. 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 Vincente   Diastolic heart failure [facility])    mild on echo 2016 no cardiologist   GERD (gastroesophageal reflux disease)    IBS (irritable bowel syndrome)    sees Dr. Luis   Macular degeneration    goes to [facility] for [facility]   Migraine    managed by neurologist    Osteoporosis    Sty    right eye   Uvulitis    Vertigo    seeing  dr bates for [facility] 08-11-2021 epic   Wears glasses    for reading   Wears hearing aid in both ears       Dispostion: I considered admission for this patient, however with reassuring workup she is appropriate for discharge.     Final Clinical Impression(s) / ED Diagnoses Final diagnoses:  None     @PCDICTATION @    Mannie Pac T, DO 08/27/24 2134

## 2024-08-27 NOTE — ED Notes (Signed)
Patient states she can not give a urine sample at this time. 

## 2024-08-27 NOTE — Telephone Encounter (Signed)
 Noted- ok to close.

## 2024-08-28 ENCOUNTER — Ambulatory Visit: Admitting: Family Medicine

## 2024-08-28 LAB — HEMOGLOBIN A1C
Hgb A1c MFr Bld: 4.8 % (ref 4.8–5.6)
Mean Plasma Glucose: 91.06 mg/dL

## 2024-08-29 ENCOUNTER — Telehealth: Payer: Self-pay | Admitting: *Deleted

## 2024-08-29 LAB — CULTURE, BLOOD (ROUTINE X 2)
Culture: NO GROWTH
Culture: NO GROWTH
Special Requests: ADEQUATE

## 2024-08-29 NOTE — Telephone Encounter (Signed)
 Spoke with Mr Kuehnle and advised him the patient could be seen by another provider sooner if preferred.  He stated the patient would prefer to see PCP, I noticed cancellation and scheduled an appt on 10/9.

## 2024-08-29 NOTE — Telephone Encounter (Signed)
 Copied from CRM #[id number]. Topic: Appointments - Scheduling Inquiry for Clinic >> Aug 29, 2024  3:07 PM Timindy P wrote: Reason for CRM: Patient was seen in the ER on 9/26 and 9/29. Christopher, pts husband is calling to schedule an ER F/U with Dr. Ozell. He is advising they cannot do Mondays and he is needing an app around 12 or later. Soonest available appt that would work would fall outside of the recommended 14 day window. Christopher is asking if he can speak to Dr. Ozell or her RN regarding a sooner appt. He can be reached at [phone] (home)    ----------------------------------------------------------------------- From previous Reason for Contact - Scheduling: Patient/patient representative is calling to schedule an appointment. Refer to attachments for appointment information.

## 2024-08-30 ENCOUNTER — Ambulatory Visit (HOSPITAL_BASED_OUTPATIENT_CLINIC_OR_DEPARTMENT_OTHER): Admitting: Cardiology

## 2024-08-30 ENCOUNTER — Encounter (HOSPITAL_BASED_OUTPATIENT_CLINIC_OR_DEPARTMENT_OTHER): Payer: Self-pay | Admitting: Cardiology

## 2024-08-30 VITALS — BP 131/63 | HR 94 | Resp 16 | Ht 61.0 in | Wt 133.8 lb

## 2024-08-30 DIAGNOSIS — E7800 Pure hypercholesterolemia, unspecified: Secondary | ICD-10-CM

## 2024-08-30 DIAGNOSIS — I2510 Atherosclerotic heart disease of native coronary artery without angina pectoris: Secondary | ICD-10-CM | POA: Diagnosis not present

## 2024-08-30 DIAGNOSIS — M940 Chondrocostal junction syndrome [Tietze]: Secondary | ICD-10-CM | POA: Diagnosis not present

## 2024-08-30 DIAGNOSIS — Z7189 Other specified counseling: Secondary | ICD-10-CM

## 2024-08-30 NOTE — Progress Notes (Signed)
 Cardiology Office Note:  .   Date:  08/30/2024  ID:  [name], DOB October 30, 1941, MRN [id number] PCP: Ozell Heron HERO, MD  [facility] Providers Cardiologist:  None {  History of Present Illness: Danielle   Danielle Gaines is a 83 y.o. 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 She was previously followed by Dr. Burnard and established care with me on 08/30/24.   Pertinent CV history: Coronary CT 10/2023 with Ca score 0, moderate plaque in pRCA with negative FFR. Echo 08/2023  Today: Went to [facility] ER 08/24/24 for altered mental status (per chart), had a terrible experience. She reports that her symptoms were actually due to severe pain in her shoulder and neck. Reviewed her ER evaluation, no clear etiology, felt to be ?polypharmacy. Planned for admission, but mental status improved, they requested to be discharged. She continued to feel poorly so they went to [facility] 08/27/24 when her BP was severely elevated (reported as 212/110). On arrival to ER, BP was improved and gradually decreased, so she was discharged.  Today she has been having more chest pain. Feels that her chest is sore, cannot turn without worsening pain. Worse with trying to lift things or use her arms. Feels like it hits a nerve. Has been told she has swelling of the chest wall before. These episodes usually last only 1-2 weeks. Uses tylenol  and muscle relaxer but not helping. Has been constant for several weeks. Reviewed that she has had hsTn checked while she was symptomatic, which were unremarkable. Not exertional or pleuritic. Not related to food.  ROS: Denies shortness of breath at rest or with normal exertion. No PND, orthopnea, LE edema or unexpected weight gain. No syncope or palpitations. ROS otherwise negative except as noted.   Studies Reviewed: Danielle    EKG:       Physical Exam:   VS:  BP 131/63 (BP Location: Left Arm, Patient Position: Sitting, Cuff Size: Normal)   Pulse 94    Resp 16   Ht 5' 1 (1.549 m)   Wt 133 lb 12.8 oz (60.7 kg)   SpO2 95%   BMI 25.28 kg/m    Wt Readings from Last 3 Encounters:  08/30/24 133 lb 12.8 oz (60.7 kg)  08/27/24 134 lb (60.8 kg)  06/26/24 138 lb 14.4 oz (63 kg)    GEN: Well nourished, well developed in no acute distress HEENT: Normal, moist mucous membranes NECK: No JVD CARDIAC: regular rhythm, normal S1 and S2, no rubs or gallops. No murmur. Tender to palpation VASCULAR: Radial and DP pulses 2+ bilaterally. No carotid bruits RESPIRATORY:  Clear to auscultation without rales, wheezing or rhonchi  ABDOMEN: Soft, non-tender, non-distended MUSCULOSKELETAL:  Ambulates independently SKIN: Warm and dry, no edema NEUROLOGIC:  Alert and oriented x 3. No focal neuro deficits noted. PSYCHIATRIC:  Normal affect    ASSESSMENT AND PLAN: .    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 She is due for recheck lipids, has her annual appt with Dr. Ozell next week -TG was 232 but she was uncertain if she was fasting for these -lp(a) normal -continue atorvastatin  40 mg daily  History of GI issues, including GERD, esophageal stricture s/p dilation, IBS, s/p subtotal  colectomy 1993 -follows with [facility], reviewed how GI, cardiac, and MSK pain can overlap  CV risk counseling and prevention -recommend heart healthy/Mediterranean diet, with whole grains, fruits, vegetable, fish, lean meats, nuts, and olive oil. Limit salt. -recommend moderate walking, 3-5 times/week for 30-50 minutes each session. Aim for at least 150 minutes/week. Goal should be pace of 3 miles/hours, or walking 1.5 miles in 30  minutes -recommend avoidance of tobacco products. Avoid excess alcohol.  Dispo: 3 mos with APP, 6 mos after that with me  Signed, Shelda Bruckner, MD   Shelda Bruckner, MD, PhD, [facility]  [facility]  [place]  [facility] at [facility] [address] [place], [place] [address] [phone]

## 2024-08-30 NOTE — Patient Instructions (Addendum)
 Medication Instructions:  TRY LIDOCAINE  PATCHES AND HOT PADS FOR YOUR DISCOMFORT   *If you need a refill on your cardiac medications before your next appointment, please call your pharmacy*  Lab Work: NONE  Testing/Procedures: NONE  Follow-Up: At [facility], you and your health needs are our priority.  As part of our continuing mission to provide you with exceptional heart care, our providers are all part of one team.  This team includes your primary Cardiologist (physician) and Advanced Practice Providers or APPs (Physician Assistants and Nurse Practitioners) who all work together to provide you with the care you need, when you need it.  Your next appointment:   9 month(s)  Provider:   Shelda Bruckner, MD  3 months with Reche ORN NP or Rosaline RAMAN NP

## 2024-09-05 DIAGNOSIS — M47816 Spondylosis without myelopathy or radiculopathy, lumbar region: Secondary | ICD-10-CM | POA: Diagnosis not present

## 2024-09-05 DIAGNOSIS — G894 Chronic pain syndrome: Secondary | ICD-10-CM | POA: Diagnosis not present

## 2024-09-05 DIAGNOSIS — M797 Fibromyalgia: Secondary | ICD-10-CM | POA: Diagnosis not present

## 2024-09-05 DIAGNOSIS — M4302 Spondylolysis, cervical region: Secondary | ICD-10-CM | POA: Diagnosis not present

## 2024-09-06 ENCOUNTER — Encounter: Payer: Self-pay | Admitting: Family Medicine

## 2024-09-06 ENCOUNTER — Ambulatory Visit: Admitting: Family Medicine

## 2024-09-06 VITALS — BP 110/70 | HR 75 | Temp 98.2°F | Ht 61.0 in | Wt 136.6 lb

## 2024-09-06 DIAGNOSIS — F332 Major depressive disorder, recurrent severe without psychotic features: Secondary | ICD-10-CM

## 2024-09-06 DIAGNOSIS — G43809 Other migraine, not intractable, without status migrainosus: Secondary | ICD-10-CM | POA: Diagnosis not present

## 2024-09-06 DIAGNOSIS — G8929 Other chronic pain: Secondary | ICD-10-CM

## 2024-09-06 DIAGNOSIS — Z23 Encounter for immunization: Secondary | ICD-10-CM

## 2024-09-06 MED ORDER — MELOXICAM 15 MG PO TABS: 15.0000 mg | ORAL_TABLET | Freq: Every day | ORAL | 5 refills | Status: AC

## 2024-09-06 MED ORDER — UBRELVY 100 MG PO TABS: 100.0000 mg | ORAL_TABLET | Freq: Every day | ORAL | 5 refills | Status: DC | PRN

## 2024-09-06 NOTE — Patient Instructions (Signed)
 [phone]  this is the phone number to the [facility] Radiology department.

## 2024-09-06 NOTE — Progress Notes (Signed)
 Established Patient Office Visit  Subjective   Patient ID: Danielle Gaines, female    DOB: 1941/11/25  Age: 83 y.o. MRN: [id number]  Chief Complaint  Patient presents with   Hospitalization Follow-up   Migraine    Patient complains of recurrent migraines, was using Cedric, has ran out and requests referral to a neurologist here in [place] (already placed-referral coordinator stated recent office notes needed as last visit was 2 yrs ago) instead of [facility] due to transportation, requests refill until neuro visit    Migraine   Discussed the use of AI scribe software for clinical note transcription with the patient, who gave verbal consent to proceed.  History of Present Illness   Danielle Gaines is an 83 year old female who presents with severe right shoulder pain.  She has experienced severe right shoulder pain for a few weeks, worsening significantly and leading to two emergency room visits on September 26th and 29th. The pain is constant, severe, rated 10 out of 10, and radiates to her neck. Movement exacerbates the pain, and she tries to keep her shoulder still. She received a pain shot from paramedics during transport to the hospital. At home, she uses Tylenol , which provides some relief. The pain has calmed down over the past week.  She has fibromyalgia and arthritis throughout her body. She experiences significant neck pain and is unable to turn her head far to the right or left. She also experiences episodes of shortness of breath and chest tightness.  She experiences daily migraines and has not taken [place] in a month. She previously tried [place] but prefers [place]. Her neurologist in [facility] had been prescribing her medications, but she has not seen them in two years.  She is currently prescribed baclofen for muscle relaxation, which she finds somewhat helpful but insufficient at the prescribed dose of one to two tablets per day. She feels she needs to take it  three times a day for adequate relief.  She is dissatisfied with her current psychiatrist, who prescribes lorazepam  for anxiety. She feels the appointments are not beneficial beyond receiving her prescription and is seeking a new psychiatrist who can provide more comprehensive care.        Current Outpatient Medications  Medication Instructions   acetaminophen  (TYLENOL ) 1,000 mg, Daily PRN   amLODipine  (NORVASC ) 5 mg, Oral, Daily   amphetamine-dextroamphetamine (ADDERALL XR) 20 MG 24 hr capsule 20 mg, Daily   atorvastatin  (LIPITOR) 40 mg, Oral, Daily   baclofen (LIORESAL) 10 mg, At bedtime PRN   citalopram (CELEXA) 40 mg, Daily   CREON  36000-114000 units CPEP capsule 36,000 Units, 3 times daily   cycloSPORINE  (RESTASIS ) 0.05 % ophthalmic emulsion 1 drop, 2 times daily   Dorzolamide HCl-Timolol Mal PF 2-0.5 % SOLN 1 drop, Both Eyes, 2 times daily   folic acid (FOLVITE) 1 MG tablet Take by mouth.   furosemide  (LASIX ) 20 mg, Daily   hyoscyamine  (ANASPAZ ) 0.125 mg   LORazepam  (ATIVAN ) 0.5 mg, 3 times daily   meclizine  (ANTIVERT ) 25 mg, Oral, 3 times daily PRN   meloxicam (MOBIC) 15 mg, Oral, Daily   olopatadine  (PATANOL) 0.1 % ophthalmic solution 1 drop, 2 times daily   omeprazole (PRILOSEC) 40 mg, 2 times daily   Probiotic Product (ALIGN PO) Daily   Qulipta 60 mg, Daily   sodium bicarbonate  650 mg, 2 times daily   tamsulosin  (FLOMAX ) 0.4 mg, Daily   Travoprost, BAK Free, (TRAVATAN) 0.004 % SOLN ophthalmic solution 1  drop, Daily at bedtime   traZODone  (DESYREL ) 100 mg, At bedtime PRN   Ubrelvy 100 mg, Oral, Daily PRN   Vitamin D  (Ergocalciferol ) (DRISDOL) 50,000 Units, Weekly    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   Contact dermatitis 09/10/2021   Hand eczema 09/10/2021   Elective procedure for unacceptable cosmetic appearance 09/10/2021   Lipoma 09/10/2021    Peripheral venous insufficiency 09/10/2021   Pruritus, unspecified 09/10/2021   Low back pain 07/16/2021   Hip pain, bilateral 06/11/2021   Myalgia 04/08/2021   Vasomotor rhinitis 11/04/2020   Referred otalgia of both ears 07/08/2020   Right ear pain 05/09/2020   Bilateral impacted cerumen 09/13/2019   Excessive sweating 06/13/2019   Anterior epistaxis 08/14/2018   Aphthous ulcer 08/14/2018   Dysfunction of both eustachian tubes 08/14/2018   Recurrent epistaxis 08/14/2018   Chronic kidney disease (CKD), stage III (moderate) (HCC) 08/01/2018   Anemia, chronic renal failure 08/01/2018   Episcleritis of left eye 02/23/2018   New daily persistent headache 01/10/2018   Temporal arteritis (HCC) 01/10/2018   Bilateral temporomandibular joint pain 01/10/2018   Constipation 10/18/2017   Sensorineural hearing loss (SNHL), 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     Objective:     BP 110/70   Pulse 75   Temp 98.2 F (36.8 C) (Oral)   Ht 5' 1 (1.549 m)   Wt 136 lb 9.6 oz (62 kg)   SpO2 98%   BMI 25.81 kg/m    Physical Exam Vitals reviewed.  Constitutional:      Appearance: Normal appearance. She is normal weight.  Cardiovascular:     Rate and Rhythm: Normal rate and regular rhythm.     Heart sounds: Normal heart sounds.  Pulmonary:     Effort: Pulmonary effort is normal.     Breath sounds: Normal breath sounds. No wheezing.  Neurological:     Mental Status: She is alert and oriented to person, place, and time. Mental status is at baseline.  Psychiatric:        Mood and Affect: Mood normal.        Behavior: Behavior normal.      No results found for any visits on 09/06/24.    The ASCVD Risk score (Arnett DK, et al.,  2019) failed to calculate for the following reasons:   The 2019 ASCVD risk score is only valid for ages 64 to 62    Assessment & Plan:  Other migraine without status migrainosus, not intractable -     Ambulatory referral to Neurology -     Holland; Take 1 tablet (100 mg total) by mouth daily as needed.  Dispense: 16 tablet; Refill: 5  Immunization due -     Flu vaccine HIGH DOSE PF(Fluzone Trivalent)  Other chronic pain -     Meloxicam; Take 1 tablet (15 mg total) by mouth daily.  Dispense: 30 tablet; Refill: 5  MDD (major depressive disorder), recurrent severe, without psychosis (HCC) -     Ambulatory referral to Psychiatry   Assessment and Plan    Chronic migraine Chronic migraines with daily occurrence, significant pain and pressure in the head, exacerbated by vision loss. Previously managed with [place], which she finds effective. Has not taken Ubrelvy for a month. Seeking a new neurologist in [place] due to logistical challenges. - Prescribe Ubrelvy as needed for migraines. - Refer to a  neurologist in [place] for further management.  Chronic pain syndrome Chronic pain syndrome with multiple contributing factors including fibromyalgia and osteoarthritis. Pain management referral in place. - Await contact from pain management for further evaluation and treatment. - Consider referral to [facility] for additional pain management strategies if needed.  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 Previously managed with Tylenol , which provides some relief. No contraindications for anti-inflammatory medications. - Prescribe meloxicam once daily as needed for osteoarthritis pain. - Continue Tylenol  as needed in conjunction with meloxicam.  Depression Expresses dissatisfaction with current psychiatric care due to lack of therapeutic benefit and high cost. Currently prescribed lorazepam  for anxiety, but prefers not to continue due to cost and lack of perceived benefit. Discussed risks of lorazepam  including dependency, withdrawal, and increased risk of dementia and falls. - Refer to [facility] behavioral health department for psychiatric evaluation and management. - Discuss potential for therapy with a psychologist for coping strategies.  General Health Maintenance Uncertain about recent flu vaccination status. Confirmed last flu shot was in January, not current season. Plans to receive COVID-19 vaccination. - Administer flu vaccine today. - Receive COVID-19 vaccination.        No follow-ups on file.    Heron CHRISTELLA Sharper, MD

## 2024-09-12 DIAGNOSIS — H353221 Exudative age-related macular degeneration, left eye, with active choroidal neovascularization: Secondary | ICD-10-CM | POA: Diagnosis not present

## 2024-09-13 DIAGNOSIS — T162XXA Foreign body in left ear, initial encounter: Secondary | ICD-10-CM | POA: Diagnosis not present

## 2024-10-01 ENCOUNTER — Encounter: Payer: Self-pay | Admitting: Neurology

## 2024-10-02 DIAGNOSIS — Z79899 Other long term (current) drug therapy: Secondary | ICD-10-CM | POA: Diagnosis not present

## 2024-10-02 DIAGNOSIS — M791 Myalgia, unspecified site: Secondary | ICD-10-CM | POA: Diagnosis not present

## 2024-10-02 DIAGNOSIS — M545 Low back pain, unspecified: Secondary | ICD-10-CM | POA: Diagnosis not present

## 2024-10-02 DIAGNOSIS — G8929 Other chronic pain: Secondary | ICD-10-CM | POA: Diagnosis not present

## 2024-10-02 DIAGNOSIS — M542 Cervicalgia: Secondary | ICD-10-CM | POA: Diagnosis not present

## 2024-10-17 DIAGNOSIS — H353221 Exudative age-related macular degeneration, left eye, with active choroidal neovascularization: Secondary | ICD-10-CM | POA: Diagnosis not present

## 2024-10-17 DIAGNOSIS — H02052 Trichiasis without entropian right lower eyelid: Secondary | ICD-10-CM | POA: Diagnosis not present

## 2024-10-17 DIAGNOSIS — H02055 Trichiasis without entropian left lower eyelid: Secondary | ICD-10-CM | POA: Diagnosis not present

## 2024-11-05 ENCOUNTER — Other Ambulatory Visit: Payer: Self-pay

## 2024-11-06 ENCOUNTER — Other Ambulatory Visit: Payer: Self-pay | Admitting: Cardiology

## 2024-11-08 MED ORDER — ATORVASTATIN CALCIUM 40 MG PO TABS: 40.0000 mg | ORAL_TABLET | Freq: Every day | ORAL | 3 refills | Status: DC

## 2024-11-08 MED ORDER — ATORVASTATIN CALCIUM 40 MG PO TABS: 40.0000 mg | ORAL_TABLET | Freq: Every day | ORAL | 3 refills | Status: AC

## 2024-12-12 ENCOUNTER — Emergency Department (HOSPITAL_COMMUNITY)

## 2024-12-12 ENCOUNTER — Encounter (HOSPITAL_COMMUNITY): Payer: Self-pay | Admitting: Internal Medicine

## 2024-12-12 ENCOUNTER — Observation Stay (HOSPITAL_COMMUNITY)
Admission: EM | Admit: 2024-12-12 | Discharge: 2024-12-14 | Disposition: A | Discharge status: Home Health | Attending: Internal Medicine | Admitting: Internal Medicine

## 2024-12-12 ENCOUNTER — Other Ambulatory Visit: Payer: Self-pay

## 2024-12-12 DIAGNOSIS — E785 Hyperlipidemia, unspecified: Secondary | ICD-10-CM | POA: Diagnosis not present

## 2024-12-12 DIAGNOSIS — F419 Anxiety disorder, unspecified: Secondary | ICD-10-CM | POA: Diagnosis not present

## 2024-12-12 DIAGNOSIS — D72829 Elevated white blood cell count, unspecified: Secondary | ICD-10-CM | POA: Insufficient documentation

## 2024-12-12 DIAGNOSIS — K219 Gastro-esophageal reflux disease without esophagitis: Secondary | ICD-10-CM | POA: Diagnosis not present

## 2024-12-12 DIAGNOSIS — M6281 Muscle weakness (generalized): Secondary | ICD-10-CM | POA: Insufficient documentation

## 2024-12-12 DIAGNOSIS — E871 Hypo-osmolality and hyponatremia: Secondary | ICD-10-CM | POA: Insufficient documentation

## 2024-12-12 DIAGNOSIS — R110 Nausea: Secondary | ICD-10-CM | POA: Diagnosis present

## 2024-12-12 DIAGNOSIS — G43809 Other migraine, not intractable, without status migrainosus: Secondary | ICD-10-CM

## 2024-12-12 DIAGNOSIS — R0602 Shortness of breath: Secondary | ICD-10-CM | POA: Insufficient documentation

## 2024-12-12 DIAGNOSIS — N183 Chronic kidney disease, stage 3 unspecified: Secondary | ICD-10-CM | POA: Diagnosis not present

## 2024-12-12 DIAGNOSIS — W19XXXA Unspecified fall, initial encounter: Secondary | ICD-10-CM | POA: Diagnosis not present

## 2024-12-12 DIAGNOSIS — M810 Age-related osteoporosis without current pathological fracture: Secondary | ICD-10-CM | POA: Diagnosis not present

## 2024-12-12 DIAGNOSIS — Z9181 History of falling: Secondary | ICD-10-CM | POA: Insufficient documentation

## 2024-12-12 DIAGNOSIS — I2510 Atherosclerotic heart disease of native coronary artery without angina pectoris: Secondary | ICD-10-CM | POA: Insufficient documentation

## 2024-12-12 DIAGNOSIS — R2689 Other abnormalities of gait and mobility: Secondary | ICD-10-CM | POA: Diagnosis not present

## 2024-12-12 DIAGNOSIS — Z79899 Other long term (current) drug therapy: Secondary | ICD-10-CM | POA: Insufficient documentation

## 2024-12-12 DIAGNOSIS — R519 Headache, unspecified: Secondary | ICD-10-CM | POA: Diagnosis not present

## 2024-12-12 DIAGNOSIS — F32A Depression, unspecified: Secondary | ICD-10-CM | POA: Diagnosis not present

## 2024-12-12 DIAGNOSIS — N179 Acute kidney failure, unspecified: Secondary | ICD-10-CM | POA: Diagnosis not present

## 2024-12-12 DIAGNOSIS — I130 Hypertensive heart and chronic kidney disease with heart failure and stage 1 through stage 4 chronic kidney disease, or unspecified chronic kidney disease: Secondary | ICD-10-CM | POA: Diagnosis not present

## 2024-12-12 DIAGNOSIS — I5032 Chronic diastolic (congestive) heart failure: Secondary | ICD-10-CM | POA: Insufficient documentation

## 2024-12-12 LAB — CBC WITH DIFFERENTIAL/PLATELET
Abs Immature Granulocytes: 0.1 K/uL — ABNORMAL HIGH (ref 0.00–0.07)
Basophils Absolute: 0.1 K/uL (ref 0.0–0.1)
Basophils Relative: 0 %
Eosinophils Absolute: 0 K/uL (ref 0.0–0.5)
Eosinophils Relative: 0 %
HCT: 41.4 % (ref 36.0–46.0)
Hemoglobin: 14.4 g/dL (ref 12.0–15.0)
Immature Granulocytes: 1 %
Lymphocytes Relative: 7 %
Lymphs Abs: 1.2 K/uL (ref 0.7–4.0)
MCH: 35.6 pg — ABNORMAL HIGH (ref 26.0–34.0)
MCHC: 34.8 g/dL (ref 30.0–36.0)
MCV: 102.2 fL — ABNORMAL HIGH (ref 80.0–100.0)
Monocytes Absolute: 1.3 K/uL — ABNORMAL HIGH (ref 0.1–1.0)
Monocytes Relative: 8 %
Neutro Abs: 13.5 K/uL — ABNORMAL HIGH (ref 1.7–7.7)
Neutrophils Relative %: 84 %
Platelets: 350 K/uL (ref 150–400)
RBC: 4.05 MIL/uL (ref 3.87–5.11)
RDW: 12.5 % (ref 11.5–15.5)
WBC: 16.2 K/uL — ABNORMAL HIGH (ref 4.0–10.5)
nRBC: 0 % (ref 0.0–0.2)

## 2024-12-12 LAB — I-STAT VENOUS BLOOD GAS, ED
Acid-base deficit: 15 mmol/L — ABNORMAL HIGH (ref 0.0–2.0)
Bicarbonate: 13 mmol/L — ABNORMAL LOW (ref 20.0–28.0)
Calcium, Ion: 1.36 mmol/L (ref 1.15–1.40)
HCT: 40 % (ref 36.0–46.0)
Hemoglobin: 13.6 g/dL (ref 12.0–15.0)
O2 Saturation: 94 %
Potassium: 3.8 mmol/L (ref 3.5–5.1)
Sodium: 135 mmol/L (ref 135–145)
TCO2: 14 mmol/L — ABNORMAL LOW (ref 22–32)
pCO2, Ven: 39.1 mmHg — ABNORMAL LOW (ref 44–60)
pH, Ven: 7.13 — CL (ref 7.25–7.43)
pO2, Ven: 91 mmHg — ABNORMAL HIGH (ref 32–45)

## 2024-12-12 LAB — COMPREHENSIVE METABOLIC PANEL WITH GFR
ALT: 19 U/L (ref 0–44)
AST: 18 U/L (ref 15–41)
Albumin: 4.3 g/dL (ref 3.5–5.0)
Alkaline Phosphatase: 102 U/L (ref 38–126)
Anion gap: 18 — ABNORMAL HIGH (ref 5–15)
BUN: 62 mg/dL — ABNORMAL HIGH (ref 8–23)
CO2: 13 mmol/L — ABNORMAL LOW (ref 22–32)
Calcium: 10.3 mg/dL (ref 8.9–10.3)
Chloride: 98 mmol/L (ref 98–111)
Creatinine, Ser: 2.22 mg/dL — ABNORMAL HIGH (ref 0.44–1.00)
GFR, Estimated: 22 mL/min — ABNORMAL LOW
Glucose, Bld: 113 mg/dL — ABNORMAL HIGH (ref 70–99)
Potassium: 4.3 mmol/L (ref 3.5–5.1)
Sodium: 129 mmol/L — ABNORMAL LOW (ref 135–145)
Total Bilirubin: 0.4 mg/dL (ref 0.0–1.2)
Total Protein: 7.9 g/dL (ref 6.5–8.1)

## 2024-12-12 LAB — URINALYSIS, ROUTINE W REFLEX MICROSCOPIC
Bilirubin Urine: NEGATIVE
Glucose, UA: NEGATIVE mg/dL
Hgb urine dipstick: NEGATIVE
Ketones, ur: NEGATIVE mg/dL
Leukocytes,Ua: NEGATIVE
Nitrite: NEGATIVE
Protein, ur: NEGATIVE mg/dL
Specific Gravity, Urine: 1.015 (ref 1.005–1.030)
pH: 5 (ref 5.0–8.0)

## 2024-12-12 LAB — CK: Total CK: 66 U/L (ref 38–234)

## 2024-12-12 LAB — RESP PANEL BY RT-PCR (RSV, FLU A&B, COVID)  RVPGX2
Influenza A by PCR: NEGATIVE
Influenza B by PCR: NEGATIVE
Resp Syncytial Virus by PCR: NEGATIVE
SARS Coronavirus 2 by RT PCR: NEGATIVE

## 2024-12-12 LAB — CBG MONITORING, ED: Glucose-Capillary: 129 mg/dL — ABNORMAL HIGH (ref 70–99)

## 2024-12-12 LAB — URINE DRUG SCREEN
Amphetamines: NEGATIVE
Barbiturates: NEGATIVE
Benzodiazepines: POSITIVE — AB
Cocaine: NEGATIVE
Fentanyl: NEGATIVE
Methadone Scn, Ur: NEGATIVE
Opiates: NEGATIVE
Tetrahydrocannabinol: NEGATIVE

## 2024-12-12 LAB — I-STAT CG4 LACTIC ACID, ED: Lactic Acid, Venous: 2 mmol/L (ref 0.5–1.9)

## 2024-12-12 MED ORDER — FOLIC ACID 1 MG PO TABS
1.0000 mg | ORAL_TABLET | Freq: Every day | ORAL | Status: DC
  Administered 2024-12-13 – 2024-12-14 (×2): 1 mg via ORAL
  Filled 2024-12-12 (×2): qty 1

## 2024-12-12 MED ORDER — HYDRALAZINE HCL 20 MG/ML IJ SOLN: 5.0000 mg | Freq: Four times a day (QID) | INTRAMUSCULAR | Status: DC | PRN

## 2024-12-12 MED ORDER — AMLODIPINE BESYLATE 5 MG PO TABS
2.5000 mg | ORAL_TABLET | Freq: Every day | ORAL | Status: DC
  Administered 2024-12-13: 2.5 mg via ORAL
  Filled 2024-12-12: qty 1

## 2024-12-12 MED ORDER — SODIUM CHLORIDE 0.9 % IV BOLUS
1000.0000 mL | Freq: Once | INTRAVENOUS | Status: AC
  Administered 2024-12-12: 1000 mL via INTRAVENOUS

## 2024-12-12 MED ORDER — SODIUM CHLORIDE 0.9% FLUSH
3.0000 mL | Freq: Two times a day (BID) | INTRAVENOUS | Status: DC
  Administered 2024-12-12 – 2024-12-14 (×4): 3 mL via INTRAVENOUS

## 2024-12-12 MED ORDER — FAMOTIDINE 20 MG PO TABS
40.0000 mg | ORAL_TABLET | Freq: Every day | ORAL | Status: DC
  Administered 2024-12-13 – 2024-12-14 (×2): 40 mg via ORAL
  Filled 2024-12-12 (×2): qty 2

## 2024-12-12 MED ORDER — DORZOLAMIDE HCL-TIMOLOL MAL 2-0.5 % OP SOLN
1.0000 [drp] | Freq: Two times a day (BID) | OPHTHALMIC | Status: DC
  Administered 2024-12-12 – 2024-12-13 (×2): 1 [drp] via OPHTHALMIC
  Filled 2024-12-12: qty 10

## 2024-12-12 MED ORDER — ONDANSETRON HCL 4 MG PO TABS: 4.0000 mg | ORAL_TABLET | Freq: Four times a day (QID) | ORAL | Status: DC | PRN

## 2024-12-12 MED ORDER — SODIUM CHLORIDE 0.9 % IV SOLN: 250.0000 mL | INTRAVENOUS | Status: AC | PRN

## 2024-12-12 MED ORDER — CYCLOSPORINE 0.05 % OP EMUL
1.0000 [drp] | Freq: Two times a day (BID) | OPHTHALMIC | Status: DC
  Administered 2024-12-12 – 2024-12-14 (×4): 1 [drp] via OPHTHALMIC
  Filled 2024-12-12 (×5): qty 30

## 2024-12-12 MED ORDER — PANTOPRAZOLE SODIUM 40 MG PO TBEC
40.0000 mg | DELAYED_RELEASE_TABLET | Freq: Every day | ORAL | Status: DC
  Administered 2024-12-13 – 2024-12-14 (×2): 40 mg via ORAL
  Filled 2024-12-12 (×2): qty 1

## 2024-12-12 MED ORDER — OLOPATADINE HCL 0.1 % OP SOLN
1.0000 [drp] | Freq: Two times a day (BID) | OPHTHALMIC | Status: DC
  Administered 2024-12-12 – 2024-12-14 (×4): 1 [drp] via OPHTHALMIC
  Filled 2024-12-12: qty 5

## 2024-12-12 MED ORDER — LINACLOTIDE 145 MCG PO CAPS
290.0000 ug | ORAL_CAPSULE | Freq: Every day | ORAL | Status: DC
  Administered 2024-12-13 – 2024-12-14 (×2): 290 ug via ORAL
  Filled 2024-12-12 (×2): qty 2

## 2024-12-12 MED ORDER — ACETAMINOPHEN 325 MG PO TABS
650.0000 mg | ORAL_TABLET | Freq: Four times a day (QID) | ORAL | Status: DC | PRN
  Administered 2024-12-12 – 2024-12-13 (×2): 650 mg via ORAL
  Filled 2024-12-12 (×2): qty 2

## 2024-12-12 MED ORDER — SODIUM CHLORIDE 0.9 % IV SOLN: 12.5000 mg | Freq: Four times a day (QID) | INTRAVENOUS | Status: DC | PRN

## 2024-12-12 MED ORDER — HEPARIN SODIUM (PORCINE) 5000 UNIT/ML IJ SOLN
5000.0000 [IU] | Freq: Three times a day (TID) | INTRAMUSCULAR | Status: DC
  Administered 2024-12-12 – 2024-12-14 (×5): 5000 [IU] via SUBCUTANEOUS
  Filled 2024-12-12 (×6): qty 1

## 2024-12-12 MED ORDER — SODIUM CHLORIDE 0.9% FLUSH: 3.0000 mL | INTRAVENOUS | Status: DC | PRN

## 2024-12-12 MED ORDER — PANCRELIPASE (LIP-PROT-AMYL) 36000-114000 UNITS PO CPEP
36000.0000 [IU] | ORAL_CAPSULE | Freq: Three times a day (TID) | ORAL | Status: DC
  Administered 2024-12-13 – 2024-12-14 (×5): 36000 [IU] via ORAL
  Filled 2024-12-12 (×5): qty 1

## 2024-12-12 MED ORDER — SODIUM CHLORIDE 0.9 % IV SOLN: INTRAVENOUS | Status: DC

## 2024-12-12 MED ORDER — TAMSULOSIN HCL 0.4 MG PO CAPS
0.4000 mg | ORAL_CAPSULE | Freq: Every day | ORAL | Status: DC
  Administered 2024-12-13 – 2024-12-14 (×2): 0.4 mg via ORAL
  Filled 2024-12-12 (×2): qty 1

## 2024-12-12 MED ORDER — ONDANSETRON HCL 4 MG/2ML IJ SOLN: 4.0000 mg | Freq: Four times a day (QID) | INTRAMUSCULAR | Status: DC | PRN

## 2024-12-12 MED ORDER — LATANOPROST 0.005 % OP SOLN
1.0000 [drp] | Freq: Every day | OPHTHALMIC | Status: DC
  Administered 2024-12-12: 1 [drp] via OPHTHALMIC
  Filled 2024-12-12: qty 2.5

## 2024-12-12 MED ORDER — ACETAMINOPHEN 500 MG PO TABS
1000.0000 mg | ORAL_TABLET | Freq: Once | ORAL | Status: AC
  Administered 2024-12-12: 1000 mg via ORAL
  Filled 2024-12-12: qty 2

## 2024-12-12 MED ORDER — BUPROPION HCL ER (XL) 150 MG PO TB24
300.0000 mg | ORAL_TABLET | Freq: Every morning | ORAL | Status: DC
  Administered 2024-12-13 – 2024-12-14 (×2): 300 mg via ORAL
  Filled 2024-12-12 (×2): qty 2

## 2024-12-12 MED ORDER — POLYETHYLENE GLYCOL 3350 17 G PO PACK: 17.0000 g | PACK | Freq: Every day | ORAL | Status: DC | PRN

## 2024-12-12 MED ORDER — ACETAMINOPHEN 650 MG RE SUPP: 650.0000 mg | Freq: Four times a day (QID) | RECTAL | Status: DC | PRN

## 2024-12-12 NOTE — H&P (Addendum)
 " [facility] History and Physical  Danielle Gaines FMW:[id number] DOB: 08/18/1941 DOA: 12/12/2024  Referring physician: ED  PCP: Ozell Heron HERO, MD   Patient is coming from: Home  Chief Complaint: Fall  HPI:  84 years old female with past medical history of anemia, depression, diastolic heart failure, GERD presented to hospital with after sustaining a fall after her legs gave out.  Patient did strike the back of her head resulting in  loss of consciousness.  She did not lose consciousness prior to the fall.  Patient denied being on blood thinners at home.  Of note, send complaint of 3 weeks history of nausea vomiting with dry cough with decreased urinary output and some cough.  Has been having some nausea for 3 to 4 months.  Additionally patient complained of headache decreased appetite chronic chest wall pain.  Denies any diarrhea abdominal pain or constipation.  Patient with history of multiple falls in the past.  Patient is legally blind.  .  In the ED, vitals were stable.  Patient was noted to have a CBC with a leukocytosis with WBC at 16.2, BMP showed sodium low at 129 with elevated creatinine at 2.2 from baseline 0.9 almost 3 months back.  Anion gap was 18.  Urinalysis did not show any evidence of infection.  Urine drug screen was positive for benzodiazepines.  CT head scan did not show any acute findings. CT of the abdomen and pelvis did not show any acute findings as well.  Chest x-ray and x-ray of the hip were not remarkable for acute findings.  EKG showed sinus rhythm.  In the ED, patient was placed on supplemental oxygen though she does not wear it at home.  Patient received 2 L of IV fluid bolus in the ED and was considered for admission to hospital for further evaluation and treatment.   Assessment and Plan Principal Problem:   AKI (acute kidney injury) Active Problems:   Anxiety and depression - managed at [facility]   GERD (gastroesophageal reflux disease)   Chronic  kidney disease (CKD), stage III (moderate) (HCC)   Chronic nausea  Recurrent falls, head trauma likely secondary to volume depletion and dehydration.  Trauma imaging was negative for acute fractures. Will get PT OT evaluation.  Continue hydration electrolyte replacement.  Patient on multiple sedating medications at home including Wellbutrin  Celexa  Ativan  and trazodone  and Adderall.  Will resume Wellbutrin  for now.  Medication reconciliation pending.  Acute kidney injury secondary to volume depletion and prerenal in etiology. Patient presented with creatinine of 2.2 from baseline 0.9 almost 3 months back.  Likely secondary poor oral intake nausea.  Continue with hydration.  Check BMP in AM.  Hold off with nephrotoxic agents including NSAIDs.  Patient was likely on NSAIDs at home.  Hold off with Lasix  from home.  History of headache from migraine.  Follows up with neurology as outpatient.  History of depression mood disorder.  On multiple medication including Wellbutrin  Celexa  Ativan  trazodone  and Adderall.  Holding some of the medications for now.  History of chronic nausea vomiting.  On Creon  at home.  Continue Pepcid .  Also on hyoscyamine , Linzess  as per previous MAR.  Med rec pending.  Will get a speech swallow evaluation since he complains of difficulty swallowing in the past..  Urinalysis negative for infection.  Failure to thrive weight loss.  Poor oral intake.  Could not quantify weight loss.  Will get nutrition consultation.  Patient stated that she did have a EGD almost  1 year back and nothing significant was found.  Hypovolemic hyponatremia Initial sodium was 129.  Patient received 2 L of IV fluid bolus in the ED.  Continue with IV hydration overnight.  Check BMP in AM.  Leukocytosis- Likely reactive.  Check CBC in AM.  History of diastolic heart failure.  Currently volume depleted.  Continue IV fluids overnight.  Reassess volume status in AM.  History of GERD.  Continue  Protonix .  History of hypertension.  On amlodipine  at home.  Will resume.  Hyperlipidemia.  On Lipitor at home.  Will hold for now.  Check CK levels.   DVT Prophylaxis: Heparin  subcu  Review of Systems:  All systems were reviewed and were negative unless otherwise mentioned in the HPI   Past Medical History:  Diagnosis Date   Anemia    Anxiety    Arthritis    Benzodiazepine dependence (HCC)    Carotid arterial disease    mild   Chronic kidney disease (CKD), stage III (moderate) (HCC) 08/01/2018   -seeing  Nephrology, Dr. 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 Vincente   Diastolic heart failure [facility])    mild on echo 2016 no cardiologist   GERD (gastroesophageal reflux disease)    IBS (irritable bowel syndrome)    sees Dr. Luis   Macular degeneration    goes to [facility] for this   Migraine    managed by neurologist   Osteoporosis    Sty    right eye   Uvulitis    Vertigo    seeing  dr bates for [facility] 08-11-2021 epic   Wears glasses    for reading   Wears hearing aid in both ears    Past Surgical History:  Procedure Laterality Date   ABDOMINAL ADHESION SURGERY     ABDOMINAL HYSTERECTOMY  1972   APPENDECTOMY     CHOLECYSTECTOMY  1996   colonscopy  1989   CYSTOSCOPY W/ URETERAL STENT PLACEMENT Right 04/27/2016   Procedure: CYSTOSCOPY WITH RETROGRADE PYELOGRAM/ RIGHT URETERAL STENT PLACEMENT;  Surgeon: Morene LELON Salines, MD;  Location: [facility];  Service: Urology;  Laterality: Right;   CYSTOSCOPY WITH RETROGRADE PYELOGRAM, URETEROSCOPY AND STENT PLACEMENT Left 10/09/2021   Procedure: CYSTOSCOPY WITH LEFT RETROGRADE URETEROSCOPY WITH HOLMIUM LASER AND STENT PLACEMENT;  Surgeon: Watt Rush, MD;  Location: [facility];  Service: Urology;  Laterality: Left;    ESOPHAGOGASTRODUODENOSCOPY     2002, 2011, 2014, 2021   EXTRACORPOREAL SHOCK WAVE LITHOTRIPSY Left 08/31/2021   Procedure: LEFT EXTRACORPOREAL SHOCK WAVE LITHOTRIPSY (ESWL);  Surgeon: Salines Morene LELON, MD;  Location: [facility];  Service: Urology;  Laterality: Left;   FLEXIBLE SIGMOIDOSCOPY     1991, 1994, 2006, 2011, 2021   HOLMIUM LASER APPLICATION Left 10/09/2021   Procedure: HOLMIUM LASER APPLICATION;  Surgeon: Watt Rush, MD;  Location: [facility];  Service: Urology;  Laterality: Left;   lasix  eye surgery     ou   MEMBRANECTOMY Left 09/21/2013   OVARIAN CYST SURGERY     PARS PLANA VITRECTOMY Left 09/21/2013   SMALL INTESTINE SURGERY     SUBTOTAL COLECTOMY     1991, 1994, 2006, 2011   tumor removal right shoulder  06/22/2021    Social History:  reports  that she has never smoked. She has never used smokeless tobacco. She reports that she does not drink alcohol and does not use drugs.  Allergies[1]  Family History  Problem Relation Age of Onset   Kidney disease Mother    Heart disease Mother    Osteoporosis Mother    Heart Problems Sister        pacemaker   Cancer Maternal Aunt        intestinal and liver cancer   Stroke Maternal Grandmother    Heart disease Maternal Grandmother    Stroke Other    Depression Other        everyone   Hypercalcemia Neg Hx    Breast cancer Neg Hx      Prior to Admission medications  Medication Sig Start Date End Date Taking? Authorizing Provider  acetaminophen  (TYLENOL ) 500 MG tablet Take 1,000 mg by mouth daily as needed for headache.    [provider]  amLODipine  (NORVASC ) 2.5 MG tablet Take 2.5 mg by mouth daily. 10/30/24   [provider]  amphetamine -dextroamphetamine  (ADDERALL XR) 20 MG 24 hr capsule Take 20 mg by mouth daily. 05/09/24   [provider]  Atogepant (QULIPTA) 60 MG TABS Take 60 mg by mouth daily. 06/02/22   [provider]  atorvastatin  (LIPITOR) 40  MG tablet Take 1 tablet (40 mg total) by mouth daily. 11/08/24   Lonni Slain, MD  baclofen (LIORESAL) 10 MG tablet Take 10 mg by mouth at bedtime as needed.    [provider]  buPROPion  (WELLBUTRIN  XL) 150 MG 24 hr tablet Take 300 mg by mouth every morning. 10/24/24   [provider]  citalopram  (CELEXA ) 40 MG tablet Take 40 mg by mouth daily. 02/21/24   [provider]  CREON  36000-114000 units CPEP capsule Take 36,000 Units by mouth 3 (three) times daily.    [provider]  cycloSPORINE  (RESTASIS ) 0.05 % ophthalmic emulsion Place 1 drop into both eyes 2 (two) times daily. 10/09/19   [provider]  diclofenac (VOLTAREN) 75 MG EC tablet Take 75 mg by mouth 2 (two) times daily as needed for moderate pain (pain score 4-6). 12/03/24   [provider]  Dorzolamide  HCl-Timolol  Mal PF 2-0.5 % SOLN INSTILL 1 DROP IN BOTH EYES TWICE DAILY 11/28/23   Ozell Heron HERO, MD  famotidine  (PEPCID ) 40 MG tablet Take 40 mg by mouth daily. 12/07/24   [provider]  folic acid  (FOLVITE ) 1 MG tablet Take by mouth. 12/21/19   [provider]  furosemide  (LASIX ) 20 MG tablet Take 20 mg by mouth daily. 08/16/24   [provider]  gabapentin (NEURONTIN) 100 MG capsule Take 100 mg by mouth. 12/04/24   [provider]  hyoscyamine  (ANASPAZ ) 0.125 MG TBDP disintergrating tablet Place 0.125 mg under the tongue. 07/23/24   [provider]  LINZESS  290 MCG CAPS capsule Take 290 mcg by mouth daily. 12/03/24   [provider]  LORazepam  (ATIVAN ) 0.5 MG tablet Take 0.5 mg by mouth in the morning, at noon, and at bedtime. 1 in am 1 at midday 2 at hs    [provider]  meclizine  (ANTIVERT ) 25 MG tablet Take 1 tablet (25 mg total) by mouth 3 (three) times daily as needed for dizziness. 06/26/24   Ozell Heron HERO, MD  meloxicam  (MOBIC ) 15 MG tablet Take 1 tablet (15 mg total) by mouth daily. 09/06/24   Ozell Heron HERO, MD  olopatadine  (PATANOL) 0.1 % ophthalmic  solution 1 drop 2 (two) times daily.    [provider]  omeprazole (PRILOSEC) 40 MG capsule Take 40 mg by mouth 2 (two) times daily. 06/10/24   [provider]  Probiotic Product (ALIGN PO) Take by mouth daily.    [provider]  promethazine  (PHENERGAN ) 12.5 MG tablet Take 12.5 mg by mouth 3 (three) times daily as needed for vomiting. 12/05/24   [provider]  sodium bicarbonate  650 MG tablet Take 650 mg by mouth 2 (two) times daily. 05/30/24   [provider]  tamsulosin  (FLOMAX ) 0.4 MG CAPS capsule Take 0.4 mg by mouth daily. 08/23/24   [provider]  Travoprost, BAK Free, (TRAVATAN) 0.004 % SOLN ophthalmic solution 1 drop at bedtime. 08/17/21   [provider]  traZODone  (DESYREL ) 50 MG tablet Take 100 mg by mouth at bedtime as needed.    [provider]  Ubrogepant  (UBRELVY ) 100 MG TABS Take 1 tablet (100 mg total) by mouth daily as needed. 09/06/24   Ozell Heron HERO, MD  Vitamin D , Ergocalciferol , (DRISDOL ) 1.25 MG (50000 UNIT) CAPS capsule Take 50,000 Units by mouth once a week. 07/24/24   [provider]    Physical Exam:  Vitals:   12/12/24 1615 12/12/24 1630 12/12/24 1632 12/12/24 1645  BP: 108/86 (!) 137/53  (!) 146/71  Pulse: 83 84  81  Resp: 14 13  16   Temp:   98 F (36.7 C)   TempSrc:      SpO2: 100% 99%  95%  Weight:      Height:       Wt Readings from Last 3 Encounters:  12/12/24 62 kg  09/06/24 62 kg  08/30/24 60.7 kg   Body mass index is 25.83 kg/m.  General:  Average built, not in obvious distress, elderly female, appears weak and deconditioned. HENT: Normocephalic, No scleral pallor or icterus noted. Oral mucosa is dry. Chest:  Clear breath sounds.  . No crackles or wheezes.  CVS: S1 &S2 heard. No murmur.  Regular rate and rhythm. Abdomen: Soft, nontender, nondistended.  Bowel sounds are heard. No abdominal mass  palpated Extremities: No cyanosis, clubbing or edema.  Peripheral pulses are palpable. Psych: Alert, awake and oriented, normal mood CNS:  No cranial nerve deficits.  Power equal in all extremities.  Generalized weakness noted Skin: Warm and dry.  No rashes noted.  Labs on Admission:   CBC: Recent Labs  Lab 12/12/24 1347  WBC 16.2*  NEUTROABS 13.5*  HGB 14.4  HCT 41.4  MCV 102.2*  PLT 350    Basic Metabolic Panel: Recent Labs  Lab 12/12/24 1347  NA 129*  K 4.3  CL 98  CO2 13*  GLUCOSE 113*  BUN 62*  CREATININE 2.22*  CALCIUM  10.3    Liver Function Tests: Recent Labs  Lab 12/12/24 1347  AST 18  ALT 19  ALKPHOS 102  BILITOT 0.4  PROT 7.9  ALBUMIN 4.3   No results for input(s): LIPASE, AMYLASE in the last 168 hours. No results for input(s): AMMONIA in the last 168 hours.  Cardiac Enzymes: No results for input(s): CKTOTAL, CKMB, CKMBINDEX, TROPONINI in the last 168 hours.  BNP (last 3 results) No results for input(s): BNP in the last 8760 hours.  ProBNP (last 3 results) No results for input(s): PROBNP in the last 8760 hours.  CBG: Recent Labs  Lab 12/12/24 1342  GLUCAP 129*    Lipase     Component Value Date/Time   LIPASE 45.0  05/17/2023 1542     Urinalysis    Component Value Date/Time   COLORURINE YELLOW 12/12/2024 1443   APPEARANCEUR CLEAR 12/12/2024 1443   LABSPEC 1.015 12/12/2024 1443   PHURINE 5.0 12/12/2024 1443   GLUCOSEU NEGATIVE 12/12/2024 1443   GLUCOSEU NEGATIVE 09/17/2016 1131   HGBUR NEGATIVE 12/12/2024 1443   BILIRUBINUR NEGATIVE 12/12/2024 1443   BILIRUBINUR Negatve 04/26/2018 1416   KETONESUR NEGATIVE 12/12/2024 1443   PROTEINUR NEGATIVE 12/12/2024 1443   UROBILINOGEN 0.2 04/26/2018 1416   UROBILINOGEN 0.2 09/17/2016 1131   NITRITE NEGATIVE 12/12/2024 1443   LEUKOCYTESUR NEGATIVE 12/12/2024 1443     Drugs of Abuse     Component Value Date/Time   LABOPIA NEGATIVE 12/12/2024 1443   COCAINSCRNUR  NEGATIVE 12/12/2024 1443   LABBENZ POSITIVE (A) 12/12/2024 1443   AMPHETMU NEGATIVE 12/12/2024 1443   THCU NEGATIVE 12/12/2024 1443   LABBARB NEGATIVE 12/12/2024 1443      Radiological Exams on Admission: CT ABDOMEN PELVIS WO CONTRAST Result Date: 12/12/2024 EXAM: CT ABDOMEN AND PELVIS WITHOUT CONTRAST 12/12/2024 03:19:00 PM TECHNIQUE: CT of the abdomen and pelvis was performed without the administration of intravenous contrast. Multiplanar reformatted images are provided for review. Automated exposure control, iterative reconstruction, and/or weight-based adjustment of the mA/kV was utilized to reduce the radiation dose to as low as reasonably achievable. COMPARISON: 12/12/2024 CLINICAL HISTORY: Abdominal pain, acute, nonlocalized. FINDINGS: LOWER CHEST: No acute abnormality. LIVER: The liver is unremarkable. GALLBLADDER AND BILE DUCTS: Status post cholecystectomy. No biliary ductal dilatation. SPLEEN: Calcified granuloma noted within the spleen. PANCREAS: The pancreas is normal in size and contour without focal lesion or ductal dilatation. ADRENAL GLANDS: Normal size and morphology bilaterally. No nodule, thickening, or hemorrhage. No periadrenal stranding. KIDNEYS, URETERS AND BLADDER: Small bilateral renal calculi measuring up to 2 to 3 mm noted. Similar appearance of chronic bilateral pelvocaliectasis. No stones identified along the course of either ureter. No perinephric or periureteral stranding. The bladder appears normal. GI AND BOWEL: The stomach is within normal limits. Assessment of bowel pathology is diminished due to lack of intravenous and oral contrast material. 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 There is no pathologic dilatation of the bowel loops proximal to the anastomosis. No bowel wall thickening or inflammation. Gaseous distention of the bowel at the level of the anastomosis is noted. There is no bowel obstruction.  PERITONEUM AND RETROPERITONEUM: There is no free fluid or fluid collections within the abdomen or pelvis. No signs of pneumoperitoneum. VASCULATURE: Aorta is normal in caliber. Aortic atherosclerotic calcification. LYMPH NODES: No abdominal or pelvic adenopathy. REPRODUCTIVE ORGANS: Status post hysterectomy. No adnexal mass. BONES AND SOFT TISSUES: No acute osseous abnormality. No focal soft tissue abnormality. IMPRESSION: 1. No acute findings in the abdomen or pelvis to explain the pain. 2. No evidence of bowel obstruction or inflammatory bowel process; gaseous distention at the sigmoid anastomosis may reflect functional ileus versus anastomotic narrowing. Electronically signed by: Waddell Calk MD 12/12/2024 03:35 PM EST RP Workstation: [id number]   DG Hip Unilat W or Wo Pelvis 2-3 Views Left Result Date: 12/12/2024 CLINICAL DATA:  Fall EXAM: DG HIP (WITH OR WITHOUT PELVIS) 2-3V LEFT COMPARISON:  Apr 15, 2021 FINDINGS: There is no evidence of hip fracture or dislocation. Moderate narrowing and osteophyte formation of left hip is noted. 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 No acute abnormality seen. Electronically Signed   By: Lynwood Landy Raddle M.D.   On: 12/12/2024 15:05  DG Chest Portable 1 View Result Date: 12/12/2024 CLINICAL DATA:  Fall after syncope EXAM: PORTABLE CHEST 1 VIEW COMPARISON:  August 27, 2024 FINDINGS: The heart size and mediastinal contours are within normal limits. Both lungs are clear. The visualized skeletal structures are unremarkable. IMPRESSION: No active disease. Electronically Signed   By: Lynwood Landy Raddle M.D.   On: 12/12/2024 15:04   CT HEAD WO CONTRAST Result Date: 12/12/2024 EXAM: CT HEAD AND CERVICAL SPINE 12/12/2024 01:37:39 PM TECHNIQUE: CT of the head and cervical spine was performed without the administration of intravenous contrast. Multiplanar reformatted images are provided for review. Automated exposure control, iterative reconstruction,  and/or weight based adjustment of the mA/kV was utilized to reduce the radiation dose to as low as reasonably achievable. COMPARISON: None available. CLINICAL HISTORY: Polytrauma, blunt The patient sustained polytrauma due to blunt force. FINDINGS: CT HEAD BRAIN AND VENTRICLES: No acute intracranial hemorrhage. No mass effect or midline shift. No abnormal extra-axial fluid collection. No evidence of acute infarct. No hydrocephalus. ORBITS: No acute abnormality. Right glaucoma shunt. SINUSES AND MASTOIDS: No acute abnormality. SOFT TISSUES AND SKULL: No acute skull fracture. No acute soft tissue abnormality. CT CERVICAL SPINE BONES AND ALIGNMENT: No acute fracture or traumatic malalignment. DEGENERATIVE CHANGES: Moderate to severe degenerative change including degenerative disc disease greatest at C5-C6 and C6-C7, and multilevel right greater than left facet uncovertebral hypertrophy. Resulting varying degrees of neural foraminal stenosis, right greater than left. SOFT TISSUES: No prevertebral soft tissue swelling. IMPRESSION: 1. No acute intracranial abnormality. 2. No acute fracture or traumatic malalignment of the cervical spine. Electronically signed by: Glendia Molt MD 12/12/2024 02:08 PM EST RP Workstation: [id number]   CT CERVICAL SPINE WO CONTRAST Result Date: 12/12/2024 EXAM: CT HEAD AND CERVICAL SPINE 12/12/2024 01:37:39 PM TECHNIQUE: CT of the head and cervical spine was performed without the administration of intravenous contrast. Multiplanar reformatted images are provided for review. Automated exposure control, iterative reconstruction, and/or weight based adjustment of the mA/kV was utilized to reduce the radiation dose to as low as reasonably achievable. COMPARISON: None available. CLINICAL HISTORY: Polytrauma, blunt The patient sustained polytrauma due to blunt force. FINDINGS: CT HEAD BRAIN AND VENTRICLES: No acute intracranial hemorrhage. No mass effect or midline shift. No abnormal extra-axial  fluid collection. No evidence of acute infarct. No hydrocephalus. ORBITS: No acute abnormality. Right glaucoma shunt. SINUSES AND MASTOIDS: No acute abnormality. SOFT TISSUES AND SKULL: No acute skull fracture. No acute soft tissue abnormality. CT CERVICAL SPINE BONES AND ALIGNMENT: No acute fracture or traumatic malalignment. DEGENERATIVE CHANGES: Moderate to severe degenerative change including degenerative disc disease greatest at C5-C6 and C6-C7, and multilevel right greater than left facet uncovertebral hypertrophy. Resulting varying degrees of neural foraminal stenosis, right greater than left. SOFT TISSUES: No prevertebral soft tissue swelling. IMPRESSION: 1. No acute intracranial abnormality. 2. No acute fracture or traumatic malalignment of the cervical spine. Electronically signed by: Glendia Molt MD 12/12/2024 02:08 PM EST RP Workstation: [id number]    EKG: Personally reviewed by me which shows sinus rhythm   Consultant: None  Code Status: DNR  Microbiology none  Antibiotics: None  Family Communication:  Patients' condition and plan of care including tests being ordered have been discussed with the patient and the patient's spouse at bedside who indicate understanding and agree with the plan.   Status is: Observation   Severity of Illness: The appropriate patient status for this patient is OBSERVATION. Observation status is judged to be reasonable and necessary in order to provide the  required intensity of service to ensure the patient's safety. The patient's presenting symptoms, physical exam findings, and initial radiographic and laboratory data in the context of their medical condition is felt to place them at decreased risk for further clinical deterioration. Furthermore, it is anticipated that the patient will be medically stable for discharge from the hospital within 2 midnights of admission.   Signed, Vernal Alstrom, MD [facility] 12/12/2024         [1]   Allergies Allergen Reactions   Elemental Sulfur Other (See Comments)   Latanoprost      Other reaction(s): Other (See Comments) Severe burning.   Sulfa Antibiotics Swelling and Other (See Comments)    Reaction:  Unspecified swelling reaction    "

## 2024-12-12 NOTE — ED Notes (Signed)
 Attempted x2 for istat labs. Phleb to stick

## 2024-12-12 NOTE — TOC CM/SW Note (Signed)
 TOC consult received for d/c planning needs. Follow-up to be completed with patient as appropriate.   Merilee Batty, MSN, RN Case Management [phone]

## 2024-12-12 NOTE — ED Provider Notes (Signed)
" °  Physical Exam   Vitals:   12/12/24 1248 12/12/24 1250 12/12/24 1253  BP:   120/60  Pulse:   74  Resp:   17  Temp:  (!) 97.5 F (36.4 C)   TempSrc:  Axillary   SpO2:   100%  Weight: 62 kg    Height: 5' 1 (1.549 m)       Physical Exam  Procedures  Procedures  ED Course / MDM   Clinical Course as of 12/12/24 1516  Wed Dec 12, 2024  1415 ED EKG [CP]  1450 ED EKG [CP]    Clinical Course User Index [CP] Primus, Valentin PARAS, Student-PA   Medical Decision Making Amount and/or Complexity of Data Reviewed Labs: ordered. Radiology: ordered.  Risk Decision regarding hospitalization.   Patient received at shift change from prior EDP Lonni Camp PA-C, see their note for initial history/physical exam findings/labs and imaging interpretations/initial assessment and plan.  84 year old female presenting after a fall.  Patient reports that her legs gave out causing her to fall and strike the back of her head resulting in a loss of consciousness.  Is not on blood thinners.  Endorses 3 weeks of nausea/vomiting with a dry cough and subjective fever.  Also endorses poor urinary output.  Pertinent labs: CBC notable for leukocytosis with white blood cell count of 16.2 with left shift CMP notable for hyponatremia with sodium of 129, significantly elevated creatinine at 2.22 with recent baseline of 0.94 from 3 months ago, anion gap of 18 Urinalysis within normal limits UDS positive for benzodiazepines   Imaging results:  - CT head/C-spine:  1. No acute intracranial abnormality. 2. No acute fracture or traumatic malalignment of the cervical spine. - CT abdomen/pelvis: 1. No acute findings in the abdomen or pelvis to explain the pain. 2. No evidence of bowel obstruction or inflammatory bowel process; gaseous distention at the sigmoid anastomosis may reflect functional ileus versus anastomotic narrowing. - CXR: No active disease. - XR L hip/pelvis: Moderate degenerative joint disease of left  hip. No acute abnormality seen.   Tylenol  ordered for back pain. I spoke with Dr. Sonjia with the hospitalist service who agrees that this patient is appropriate for admission given new AKI.  Patient and her husband are aware of this plan and are in agreement.        Glendia Rocky SAILOR, [place] 12/12/24 1642    Tegeler, Lonni PARAS, MD 12/12/24 2349  "

## 2024-12-12 NOTE — ED Notes (Signed)
 Patient transported to CT

## 2024-12-12 NOTE — ED Triage Notes (Signed)
 Pt BIB [facility] from home. Fall after syncopal episode. N/V for 3 weeks and had multiple falls. Hit head on the back when she fell. No thinners. Legally blind. Equal and reactive pupils. O2 90 2L.  EMS VS 128/60 HR 73

## 2024-12-12 NOTE — ED Provider Notes (Signed)
 " [facility] Provider Note   CSN: [id number] Arrival date & time: 12/12/24  1245     Patient presents with: No chief complaint on file.   Danielle Gaines is a 84 y.o. female.   The history is provided by the patient, the spouse, the EMS personnel and medical records. No language interpreter was used.     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 Pt states that she felt dizzy and nauseated when her legs gave out on her. She states she hit this back of her head and lost consciousness for a couple of minutes before being found by her husband. Pt states she is not on any blood thinners. She states that she has had daily nausea and vomiting for the last 3 weeks accompanied by a dry cough and self-reported fever. Pt states she vomited this morning prior to the fall. She reports that she feels dizzy upon standing but denies SOB. Pt states she currently had a headache, but reports no other injuries. Pt states that she has chronic chest pain at rest and this is not a new problem. She states that recently she has felt like she is unable to urinate but denies burning with urination. Pt reports her appetite has been decreased and she is not been able to adequately intake food or liquids. Pt denies constipation, diarrhea and abdominal pain. Pt currently on 3L of O2 via [place] but does not wear O2 at home.   Prior to Admission medications  Medication Sig Start Date End Date Taking? Authorizing Provider  acetaminophen  (TYLENOL ) 500 MG tablet Take 1,000 mg by mouth daily as needed for headache.    [provider]  amLODipine  (NORVASC ) 5 MG tablet Take 1 tablet (5 mg total) by mouth daily. 08/27/24 09/26/24  Mannie Pac T, DO  amphetamine -dextroamphetamine  (ADDERALL XR) 20 MG 24 hr capsule  Take 20 mg by mouth daily. 05/09/24   [provider]  Atogepant (QULIPTA) 60 MG TABS Take 60 mg by mouth daily. 06/02/22   [provider]  atorvastatin  (LIPITOR) 40 MG tablet Take 1 tablet (40 mg total) by mouth daily. 11/08/24   Lonni Slain, MD  baclofen (LIORESAL) 10 MG tablet Take 10 mg by mouth at bedtime as needed.    [provider]  citalopram  (CELEXA ) 40 MG tablet Take 40 mg by mouth daily. 02/21/24   [provider]  CREON  36000-114000 units CPEP capsule Take 36,000 Units by mouth 3 (three) times daily.    [provider]  cycloSPORINE  (RESTASIS ) 0.05 % ophthalmic emulsion Place 1 drop into both eyes 2 (two) times daily. 10/09/19   [provider]  Dorzolamide  HCl-Timolol  Mal PF 2-0.5 % SOLN INSTILL 1 DROP IN BOTH EYES TWICE DAILY 11/28/23   Ozell Heron HERO, MD  folic acid  (FOLVITE ) 1 MG tablet Take by mouth. 12/21/19   [provider]  furosemide  (LASIX ) 20 MG tablet Take 20 mg by mouth daily. 08/16/24   [provider]  hyoscyamine  (ANASPAZ ) 0.125 MG TBDP disintergrating tablet Place 0.125 mg under the tongue. 07/23/24   [provider]  LORazepam  (ATIVAN ) 0.5 MG tablet Take 0.5 mg by mouth in the morning, at noon, and at bedtime. 1 in am 1 at midday 2 at hs    [provider]  meclizine  (ANTIVERT ) 25 MG tablet Take 1  tablet (25 mg total) by mouth 3 (three) times daily as needed for dizziness. 06/26/24   Ozell Heron HERO, MD  meloxicam  (MOBIC ) 15 MG tablet Take 1 tablet (15 mg total) by mouth daily. 09/06/24   Ozell Heron HERO, MD  olopatadine  (PATANOL) 0.1 % ophthalmic solution 1 drop 2 (two) times daily.    [provider]  omeprazole (PRILOSEC) 40 MG capsule Take 40 mg by mouth 2 (two) times daily. 06/10/24   [provider]  Probiotic Product (ALIGN PO) Take by mouth daily.    [provider]  sodium bicarbonate  650 MG tablet Take 650 mg by mouth 2 (two)  times daily. 05/30/24   [provider]  tamsulosin  (FLOMAX ) 0.4 MG CAPS capsule Take 0.4 mg by mouth daily. 08/23/24   [provider]  Travoprost, BAK Free, (TRAVATAN) 0.004 % SOLN ophthalmic solution 1 drop at bedtime. 08/17/21   [provider]  traZODone  (DESYREL ) 50 MG tablet Take 100 mg by mouth at bedtime as needed.    [provider]  Ubrogepant  (UBRELVY ) 100 MG TABS Take 1 tablet (100 mg total) by mouth daily as needed. 09/06/24   Ozell Heron HERO, MD  Vitamin D , Ergocalciferol , (DRISDOL ) 1.25 MG (50000 UNIT) CAPS capsule Take 50,000 Units by mouth once a week. 07/24/24   [provider]    Allergies: Elemental sulfur, Latanoprost , and Sulfa antibiotics    Review of Systems  All other systems reviewed and are negative.   Updated Vital Signs BP 120/60 (BP Location: Right Arm)   Pulse 74   Temp (!) 97.5 F (36.4 C) (Axillary)   Resp 17   Ht 5' 1 (1.549 m)   Wt 62 kg   SpO2 100%   BMI 25.83 kg/m   Physical Exam Vitals and nursing note reviewed.  Constitutional:      General: She is not in acute distress.    Appearance: She is well-developed.     Comments: Patient is laying in bed, c-collar in place, in no acute discomfort.  HENT:     Head: Atraumatic.  Eyes:     Conjunctiva/sclera: Conjunctivae normal.     Comments: Left eye is teary  Neck:     Comments: C-collar removed, no subsequent midline cervical spine tenderness crepitus or step-off Cardiovascular:     Rate and Rhythm: Normal rate and regular rhythm.     Pulses: Normal pulses.     Heart sounds: Normal heart sounds.  Pulmonary:     Effort: Pulmonary effort is normal.     Breath sounds: No wheezing or rales.  Abdominal:     Palpations: Abdomen is soft.     Tenderness: There is no abdominal tenderness.  Musculoskeletal:        General: Tenderness (Tenderness to left hip and left proximal thigh without deformity or bruising noted.) present.     Cervical back:  Normal range of motion and neck supple.     Comments: No midline spine tenderness  Skin:    Findings: No rash.  Neurological:     Mental Status: She is alert and oriented to person, place, and time.  Psychiatric:        Mood and Affect: Mood normal.     (all labs ordered are listed, but only abnormal results are displayed) Labs Reviewed  COMPREHENSIVE METABOLIC PANEL WITH GFR - Abnormal; Notable for the following components:      Result Value   Sodium 129 (*)    CO2 13 (*)  Glucose, Bld 113 (*)    BUN 62 (*)    Creatinine, Ser 2.22 (*)    GFR, Estimated 22 (*)    Anion gap 18 (*)    All other components within normal limits  CBC WITH DIFFERENTIAL/PLATELET - Abnormal; Notable for the following components:   WBC 16.2 (*)    MCV 102.2 (*)    MCH 35.6 (*)    Neutro Abs 13.5 (*)    Monocytes Absolute 1.3 (*)    Abs Immature Granulocytes 0.10 (*)    All other components within normal limits  CBG MONITORING, ED - Abnormal; Notable for the following components:   Glucose-Capillary 129 (*)    All other components within normal limits  RESP PANEL BY RT-PCR (RSV, FLU A&B, COVID)  RVPGX2  URINALYSIS, ROUTINE W REFLEX MICROSCOPIC  URINE DRUG SCREEN  I-STAT VENOUS BLOOD GAS, ED  I-STAT CG4 LACTIC ACID, ED    EKG: EKG Interpretation Date/Time:  Wednesday December 12 2024 12:53:37 EST Ventricular Rate:  72 PR Interval:  213 QRS Duration:  108 QT Interval:  391 QTC Calculation: 428 R Axis:   -52  Text Interpretation: Sinus or ectopic atrial rhythm Borderline prolonged PR interval Left anterior fascicular block LVH with secondary repolarization abnormality Anterior infarct, old No significant change since last tracing Confirmed by Dreama Longs ([id number]) on 12/12/2024 2:54:15 PM  Radiology: [facility] Hip Unilat W or Wo Pelvis 2-3 Views Left Result Date: 12/12/2024 CLINICAL DATA:  Fall EXAM: DG HIP (WITH OR WITHOUT PELVIS) 2-3V LEFT COMPARISON:  Apr 15, 2021 FINDINGS: There is no  evidence of hip fracture or dislocation. Moderate narrowing and osteophyte formation of left hip is noted. 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 No acute abnormality seen. Electronically Signed   By: Lynwood Landy Raddle M.D.   On: 12/12/2024 15:05   DG Chest Portable 1 View Result Date: 12/12/2024 CLINICAL DATA:  Fall after syncope EXAM: PORTABLE CHEST 1 VIEW COMPARISON:  August 27, 2024 FINDINGS: The heart size and mediastinal contours are within normal limits. Both lungs are clear. The visualized skeletal structures are unremarkable. IMPRESSION: No active disease. Electronically Signed   By: Lynwood Landy Raddle M.D.   On: 12/12/2024 15:04   CT HEAD WO CONTRAST Result Date: 12/12/2024 EXAM: CT HEAD AND CERVICAL SPINE 12/12/2024 01:37:39 PM TECHNIQUE: CT of the head and cervical spine was performed without the administration of intravenous contrast. Multiplanar reformatted images are provided for review. Automated exposure control, iterative reconstruction, and/or weight based adjustment of the mA/kV was utilized to reduce the radiation dose to as low as reasonably achievable. COMPARISON: None available. CLINICAL HISTORY: Polytrauma, blunt The patient sustained polytrauma due to blunt force. FINDINGS: CT HEAD BRAIN AND VENTRICLES: No acute intracranial hemorrhage. No mass effect or midline shift. No abnormal extra-axial fluid collection. No evidence of acute infarct. No hydrocephalus. ORBITS: No acute abnormality. Right glaucoma shunt. SINUSES AND MASTOIDS: No acute abnormality. SOFT TISSUES AND SKULL: No acute skull fracture. No acute soft tissue abnormality. CT CERVICAL SPINE BONES AND ALIGNMENT: No acute fracture or traumatic malalignment. DEGENERATIVE CHANGES: Moderate to severe degenerative change including degenerative disc disease greatest at C5-C6 and C6-C7, and multilevel right greater than left facet uncovertebral hypertrophy. Resulting varying degrees of neural foraminal stenosis,  right greater than left. SOFT TISSUES: No prevertebral soft tissue swelling. IMPRESSION: 1. No acute intracranial abnormality. 2. No acute fracture or traumatic malalignment of the cervical spine. Electronically signed by: Glendia Molt MD 12/12/2024 02:08 PM EST RP  Workstation: [id number]   CT CERVICAL SPINE WO CONTRAST Result Date: 12/12/2024 EXAM: CT HEAD AND CERVICAL SPINE 12/12/2024 01:37:39 PM TECHNIQUE: CT of the head and cervical spine was performed without the administration of intravenous contrast. Multiplanar reformatted images are provided for review. Automated exposure control, iterative reconstruction, and/or weight based adjustment of the mA/kV was utilized to reduce the radiation dose to as low as reasonably achievable. COMPARISON: None available. CLINICAL HISTORY: Polytrauma, blunt The patient sustained polytrauma due to blunt force. FINDINGS: CT HEAD BRAIN AND VENTRICLES: No acute intracranial hemorrhage. No mass effect or midline shift. No abnormal extra-axial fluid collection. No evidence of acute infarct. No hydrocephalus. ORBITS: No acute abnormality. Right glaucoma shunt. SINUSES AND MASTOIDS: No acute abnormality. SOFT TISSUES AND SKULL: No acute skull fracture. No acute soft tissue abnormality. CT CERVICAL SPINE BONES AND ALIGNMENT: No acute fracture or traumatic malalignment. DEGENERATIVE CHANGES: Moderate to severe degenerative change including degenerative disc disease greatest at C5-C6 and C6-C7, and multilevel right greater than left facet uncovertebral hypertrophy. Resulting varying degrees of neural foraminal stenosis, right greater than left. SOFT TISSUES: No prevertebral soft tissue swelling. IMPRESSION: 1. No acute intracranial abnormality. 2. No acute fracture or traumatic malalignment of the cervical spine. Electronically signed by: Glendia Molt MD 12/12/2024 02:08 PM EST RP Workstation: [id number]     Procedures   Medications Ordered in the ED  sodium chloride  0.9 % bolus  1,000 mL (1,000 mLs Intravenous New Bag/Given 12/12/24 1314)  sodium chloride  0.9 % bolus 1,000 mL (1,000 mLs Intravenous New Bag/Given 12/12/24 1448)    Clinical Course as of 12/12/24 1512  Wed Dec 12, 2024  1415 ED EKG [CP]  1450 ED EKG [CP]    Clinical Course User Index [CP] Primus, Valentin PARAS, Student-PA                                 Medical Decision Making Amount and/or Complexity of Data Reviewed Labs: ordered. Radiology: ordered.   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 Pt states that she felt dizzy and nauseated when her legs gave out on her. She states she hit this back of her head and lost consciousness for a couple of minutes before being found by her husband. Pt states she is not on any blood thinners. She states that she has had daily nausea and vomiting for the last 3 weeks accompanied by a dry cough and self-reported fever. Pt states she vomited this morning prior to the fall. She reports that she feels dizzy upon standing but denies SOB. Pt states she currently had a headache, but reports no other injuries. Pt states that she has chronic chest pain at rest and this is not a new problem. She states that recently she has felt like she is unable to urinate but denies burning with urination. Pt reports her appetite has been decreased and she is not been able to adequately intake food or liquids. Pt denies constipation, diarrhea and abdominal pain. Pt currently on 3L of O2 via [place] but does  not wear O2 at home.   Exam notable for tenderness to left hip and thigh region without any obvious deformity.  Able to range the hip.  No significant  midline spine tenderness.  Patient is mentating appropriately.  She has a soft nontender abdomen.  No obvious signs of injury noted.  Initially patient was placed on supplemental oxygen at 2 L.  However patient denies having any shortness of breath, supplemental oxygen removed and patient maintained oxygen saturation at 100% on room air.  -Labs ordered, independently viewed and interpreted by me.  Labs remarkable for WBC 16.2.   -The patient was maintained on a cardiac monitor.  I personally viewed and interpreted the cardiac monitored which showed an underlying rhythm of: SR -Imaging independently viewed and interpreted by me and I agree with radiologist's interpretation.  Result remarkable for head and Cspine CT unremarkable.   -This patient presents to the ED for concern of fall, this involves an extensive number of treatment options, and is a complaint that carries with it a high risk of complications and morbidity.  The differential diagnosis includes dehydration, anemia, metabolic derangement, PE, ACS, fx, dislocation, strain, sprain -Co morbidities that complicate the patient evaluation includes osteoporosis, chronic pain, CKD, CHF, vertigo -Treatment includes IVF,  -Reevaluation of the patient after these medicines showed that the patient improved -PCP office notes or outside notes reviewed -Discussion with oncoming team who will f/u on labs and imaging and will consult for admission likely due to AKI and possible source of infection -Escalation to admission/observation considered: dispo pending      Final diagnoses:  AKI (acute kidney injury)    ED Discharge Orders     None          Nivia Colon, PA-C 12/12/24 1515    Dreama Longs, MD 12/12/24 2323  "

## 2024-12-12 NOTE — Hospital Course (Signed)
 84 years old female with past medical history of anemia, depression, diastolic heart failure, GERD presented to hospital with after sustaining a fall after her legs gave out.  Patient did strike the back of her head resulting in  loss of consciousness.  Of note, patient was not on blood thinners at home.  Patient complains of 3 weeks history of nausea vomiting with dry cough with decreased urinary output and some cough.  She also had some headache.  Patient also complained of decreased appetite but denies any diarrhea, abdominal pain or constipation.  Patient with history of multiple falls in the past.  Patient is legally blind.  Patient also complains of chronic chest pain.  In the ED, vitals were stable.  Patient was noted to have a CBC with a leukocytosis with WBC at 16.2, BMP showed sodium low at 129 with elevated creatinine at 2.2 from baseline 0.9 almost 3 months back.  Anion gap was 18.  Urinalysis did not show any evidence of infection.  Urine drug screen was positive for benzodiazepines.  CT head scan did not show any acute findings CT of the abdomen and pelvis did not show any acute findings as well.  Chest x-ray and x-ray of the hip were not remarkable for acute findings.  EKG showed sinus rhythm.  In the ED patient was placed on supplemental oxygen though she does not wear it at home.  Patient received 2 L of IV fluid bolus in the ED and was considered for admission to hospital for further evaluation and treatment.   Recurrent falls, head trauma likely secondary to volume depletion and dehydration.  Trauma imaging was negative for acute fractures. Will get PT OT evaluation.  Continue hydration electrolyte replacement.  Acute kidney injury Patient presented with creatinine of 2.2 from baseline 0.9 almost 3 months back.  Likely secondary poor oral intake nausea.  Continue with hydration.  Check BMP in AM.  Hypovolemic hyponatremia Patient received 2 L of IV fluid bolus in the ED.  Continue with  IV hydration overnight.  Check BMP in AM.  Leukocytosis- Likely reactive  History of diastolic heart failure.  Currently volume depleted.  Continue IV fluids overnight.  Reassess volume status in AM.  History of GERD.  Continue Protonix .

## 2024-12-12 NOTE — ED Notes (Signed)
 Notified PA about difficulty obtaining labs.

## 2024-12-13 ENCOUNTER — Encounter (HOSPITAL_COMMUNITY): Payer: Self-pay | Admitting: Internal Medicine

## 2024-12-13 DIAGNOSIS — N179 Acute kidney failure, unspecified: Secondary | ICD-10-CM | POA: Diagnosis not present

## 2024-12-13 LAB — CBC
HCT: 29.4 % — ABNORMAL LOW (ref 36.0–46.0)
Hemoglobin: 10.4 g/dL — ABNORMAL LOW (ref 12.0–15.0)
MCH: 35.6 pg — ABNORMAL HIGH (ref 26.0–34.0)
MCHC: 35.4 g/dL (ref 30.0–36.0)
MCV: 100.7 fL — ABNORMAL HIGH (ref 80.0–100.0)
Platelets: 301 K/uL (ref 150–400)
RBC: 2.92 MIL/uL — ABNORMAL LOW (ref 3.87–5.11)
RDW: 12.5 % (ref 11.5–15.5)
WBC: 13.6 K/uL — ABNORMAL HIGH (ref 4.0–10.5)
nRBC: 0 % (ref 0.0–0.2)

## 2024-12-13 LAB — BASIC METABOLIC PANEL WITH GFR
Anion gap: 11 (ref 5–15)
BUN: 44 mg/dL — ABNORMAL HIGH (ref 8–23)
CO2: 15 mmol/L — ABNORMAL LOW (ref 22–32)
Calcium: 9 mg/dL (ref 8.9–10.3)
Chloride: 106 mmol/L (ref 98–111)
Creatinine, Ser: 1.45 mg/dL — ABNORMAL HIGH (ref 0.44–1.00)
GFR, Estimated: 36 mL/min — ABNORMAL LOW
Glucose, Bld: 104 mg/dL — ABNORMAL HIGH (ref 70–99)
Potassium: 4.1 mmol/L (ref 3.5–5.1)
Sodium: 132 mmol/L — ABNORMAL LOW (ref 135–145)

## 2024-12-13 LAB — MAGNESIUM: Magnesium: 1.7 mg/dL (ref 1.7–2.4)

## 2024-12-13 MED ORDER — DORZOLAMIDE HCL-TIMOLOL MAL 2-0.5 % OP SOLN
1.0000 [drp] | Freq: Two times a day (BID) | OPHTHALMIC | Status: DC
  Administered 2024-12-13 – 2024-12-14 (×2): 1 [drp] via OPHTHALMIC
  Filled 2024-12-13: qty 10

## 2024-12-13 MED ORDER — AMPHETAMINE-DEXTROAMPHET ER 5 MG PO CP24
20.0000 mg | ORAL_CAPSULE | Freq: Every day | ORAL | Status: DC
  Filled 2024-12-13: qty 4

## 2024-12-13 MED ORDER — ATORVASTATIN CALCIUM 40 MG PO TABS
40.0000 mg | ORAL_TABLET | Freq: Every day | ORAL | Status: DC
  Administered 2024-12-13 – 2024-12-14 (×2): 40 mg via ORAL
  Filled 2024-12-13 (×2): qty 1

## 2024-12-13 MED ORDER — BUTALBITAL-APAP-CAFFEINE 50-325-40 MG PO TABS
1.0000 | ORAL_TABLET | Freq: Three times a day (TID) | ORAL | Status: DC | PRN
  Administered 2024-12-13: 1 via ORAL
  Filled 2024-12-13: qty 1

## 2024-12-13 MED ORDER — VITAMIN D (ERGOCALCIFEROL) 1.25 MG (50000 UNIT) PO CAPS
50000.0000 [IU] | ORAL_CAPSULE | ORAL | Status: DC
  Administered 2024-12-13: 50000 [IU] via ORAL
  Filled 2024-12-13 (×2): qty 1

## 2024-12-13 MED ORDER — CITALOPRAM HYDROBROMIDE 20 MG PO TABS
40.0000 mg | ORAL_TABLET | Freq: Every day | ORAL | Status: DC
  Administered 2024-12-13 – 2024-12-14 (×2): 40 mg via ORAL
  Filled 2024-12-13 (×2): qty 2

## 2024-12-13 MED ORDER — SODIUM BICARBONATE 650 MG PO TABS
650.0000 mg | ORAL_TABLET | Freq: Two times a day (BID) | ORAL | Status: DC
  Administered 2024-12-13 – 2024-12-14 (×3): 650 mg via ORAL
  Filled 2024-12-13 (×3): qty 1

## 2024-12-13 MED ORDER — LATANOPROST 0.005 % OP SOLN
1.0000 [drp] | Freq: Two times a day (BID) | OPHTHALMIC | Status: DC
  Administered 2024-12-14: 1 [drp] via OPHTHALMIC

## 2024-12-13 MED ORDER — ENSURE PLUS HIGH PROTEIN PO LIQD
237.0000 mL | Freq: Two times a day (BID) | ORAL | Status: DC
  Administered 2024-12-14: 237 mL via ORAL

## 2024-12-13 MED ORDER — GUAIFENESIN ER 600 MG PO TB12: 600.0000 mg | ORAL_TABLET | Freq: Two times a day (BID) | ORAL | Status: DC | PRN

## 2024-12-13 NOTE — Progress Notes (Signed)
 Initial Nutrition Assessment  DOCUMENTATION CODES:   Not applicable  INTERVENTION:  Continue regular diet as ordered Meal ordering with assistance for ease of ordering and prevention of missing meals Carnation Breakfast Essentials BID with meals, each packet mixed with 8 ounces of 2% milk provides 13 grams of protein and 260 calories. Ensure Plus High Protein po BID between meals (chocolate), each supplement provides 350 kcal and 20 grams of protein. High Calorie, High Protein Nutrition Therapy handout added to AVS  NUTRITION DIAGNOSIS:  Inadequate oral intake related to nausea, poor appetite as evidenced by per patient/family report.  GOAL:  Patient will meet greater than or equal to 90% of their needs  MONITOR:  PO intake, Supplement acceptance, Labs, Weight trends  REASON FOR ASSESSMENT:  Consult Poor PO  ASSESSMENT:  Pt admitted from home after sustaining a fall d/t legs giving out resulting in loss of consciousness. PMH significant for anemia, depression, diastolic heart failure, GERD, legally blind.  Recurrent falls likely secondary to volume depletion and dehydration resulting in AKI of pre-renal etiology.   On admission, pt admitted with c/o 3 week history of nausea, vomiting, with dry cough and decreased urinary output.   Nausea for 3-4 months.   SLP consulted for h/o difficulty swallowing.  EGD about 1 year ago no significant findings.  Checked in with patient at bedside. Her husband and son also present. She states that for the last several months she has encountered a poor appetite d/t nausea, altered taste and back pain + migraines. Given the pain, she has been less mobile and lays down more. When she does walk, she reports using a walker.  Pt's husband expresses concern with her eating very minimally and some days nothing at all. For 2 days PTA she only ate 1 sandwich a day. Prior to that for 3 days she ate nothing. Some days she will eat yogurt. They do mention  that everyday she will drink 2-3 muscle milks, even on the days she does not eat anything.   No meal completions on file to review. Breakfast today pt reports eating pancakes and about half the sausage. She was also provided with pudding and crackers with SLP.   Per review of weight history, pt's weight appears to have trended down over the last year noting a weight loss of 10.5% which is not clinically significant for time frame. Pt reports that her weight has declined as her clothes have been fitting more loosely however she is uncertain how much weight she has lost.   Home medications: creon , pepcid , hycoasamine, linzess  Pt reports that her bowels are regular for her though she does endorse some constipation for which she takes miralax  for.   Medications: pepcid , folvite , linzess , creon  TID, PRN phenergan   Labs:  Sodium 132 BUN 44 Cr 1.45 GFR 36  NUTRITION - FOCUSED PHYSICAL EXAM: Flowsheet Row Most Recent Value  Orbital Region No depletion  Upper Arm Region Severe depletion  Thoracic and Lumbar Region No depletion  Buccal Region No depletion  Temple Region No depletion  Clavicle Bone Region Mild depletion  Clavicle and Acromion Bone Region No depletion  Scapular Bone Region No depletion  Dorsal Hand Severe depletion  Patellar Region Moderate depletion  Anterior Thigh Region Moderate depletion  Posterior Calf Region Severe depletion  Edema (RD Assessment) None  Hair Reviewed  Eyes Reviewed  Mouth Reviewed  Skin Reviewed  Nails Reviewed    Diet Order:   Diet Order  Diet regular Room service appropriate? Yes with Assist; Fluid consistency: Thin  Diet effective now                   EDUCATION NEEDS:  Education needs have been addressed  Skin:  Skin Assessment: Reviewed RN Assessment  Last BM:  1/14  Height:  Ht Readings from Last 1 Encounters:  12/12/24 5' 1 (1.549 m)    Weight:  Wt Readings from Last 1 Encounters:  12/13/24 58.6 kg    BMI:  Body mass index is 24.41 kg/m.  Estimated Nutritional Needs:   Kcal:  1400-1600  Protein:  70-85g  Fluid:  >/=1.5L  Royce Maris, RDN, LDN Clinical Nutrition See [facility] for contact information.

## 2024-12-13 NOTE — Evaluation (Signed)
 Clinical/Bedside Swallow Evaluation Patient Details  Name: Danielle Gaines MRN: [id number] Date of Birth: 1941/08/04  Today's Date: 12/13/2024 Time: SLP Start Time (ACUTE ONLY): 1015 SLP Stop Time (ACUTE ONLY): 1030 SLP Time Calculation (min) (ACUTE ONLY): 15 min  Past Medical History:  Past Medical History:  Diagnosis Date   Anemia    Anxiety    Arthritis    Benzodiazepine dependence (HCC)    Carotid arterial disease    mild   Chronic kidney disease (CKD), stage III (moderate) (HCC) 08/01/2018   -seeing  Nephrology, Dr. 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 Vincente   Diastolic heart failure [facility])    mild on echo 2016 no cardiologist   GERD (gastroesophageal reflux disease)    IBS (irritable bowel syndrome)    sees Dr. Luis   Macular degeneration    goes to [facility] for this   Migraine    managed by neurologist   Osteoporosis    Sty    right eye   Uvulitis    Vertigo    seeing  dr bates for [facility] 08-11-2021 epic   Wears glasses    for reading   Wears hearing aid in both ears    Past Surgical History:  Past Surgical History:  Procedure Laterality Date   ABDOMINAL ADHESION SURGERY     ABDOMINAL HYSTERECTOMY  1972   APPENDECTOMY     CHOLECYSTECTOMY  1996   colonscopy  1989   CYSTOSCOPY W/ URETERAL STENT PLACEMENT Right 04/27/2016   Procedure: CYSTOSCOPY WITH RETROGRADE PYELOGRAM/ RIGHT URETERAL STENT PLACEMENT;  Surgeon: Morene LELON Salines, MD;  Location: [facility];  Service: Urology;  Laterality: Right;   CYSTOSCOPY WITH RETROGRADE PYELOGRAM, URETEROSCOPY AND STENT PLACEMENT Left 10/09/2021   Procedure: CYSTOSCOPY WITH LEFT RETROGRADE URETEROSCOPY WITH HOLMIUM LASER AND STENT PLACEMENT;  Surgeon: Watt Rush, MD;  Location: [facility];  Service: Urology;  Laterality: Left;    ESOPHAGOGASTRODUODENOSCOPY     2002, 2011, 2014, 2021   EXTRACORPOREAL SHOCK WAVE LITHOTRIPSY Left 08/31/2021   Procedure: LEFT EXTRACORPOREAL SHOCK WAVE LITHOTRIPSY (ESWL);  Surgeon: Salines Morene LELON, MD;  Location: [facility];  Service: Urology;  Laterality: Left;   FLEXIBLE SIGMOIDOSCOPY     1991, 1994, 2006, 2011, 2021   HOLMIUM LASER APPLICATION Left 10/09/2021   Procedure: HOLMIUM LASER APPLICATION;  Surgeon: Watt Rush, MD;  Location: [facility];  Service: Urology;  Laterality: Left;   lasix  eye surgery     ou   MEMBRANECTOMY Left 09/21/2013   OVARIAN CYST SURGERY     PARS PLANA VITRECTOMY Left 09/21/2013   SMALL INTESTINE SURGERY     SUBTOTAL COLECTOMY     1991, 1994, 2006, 2011   tumor removal right shoulder  06/22/2021   HPI:  84yo female admitted from home 12/12/24 after a fall.  CTHead/CXR: negative for acute findings. 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 Lives with husband at home. PMH: anemia, anxiety, depression, CAD,  CKD3, chronic nausea, migraine, diastolic heart failure, GERD, IBS, bil hearing aids.    Assessment / Plan / Recommendation  Clinical Impression  Pt presents with functional oropharyngeal swallow, as best can be determined at bedside without instrumental assessment. No overt s/s aspiration observed or reported, including 3oz ([facility]) swallow screen.  Pt with history of esophageal dilation 5+ years ago per pt, without difficulty since then. SLP reviewed strategies for management of esophageal dysmotility. Recommend regular diet/thin liquids to allow full range of PO choices. Meds as tolerated. No further ST intervention recommended at this time. Please reconsult if needs arise. RN/MD informed.  SLP Visit Diagnosis: Dysphagia, unspecified (R13.10)    Aspiration Risk  No limitations    Diet Recommendation Regular;Thin liquid    Liquid Administration via: Cup;Straw Medication Administration: Other (Comment) (as  tolerated) Supervision: Patient able to self feed Compensations: Slow rate;Small sips/bites Postural Changes: Seated upright at 90 degrees;Remain upright for at least 30 minutes after po intake    Other Recommendations Oral Care Recommendations: Oral care BID     Swallow Evaluation Recommendations  Reg/thin, meds as tolerated   Assistance Recommended at Discharge  Pt with history of cognitive deficits. She lives with her husband.  Functional Status Assessment Patient has not had a recent decline in their functional status      Prognosis Prognosis for improved oropharyngeal function: Good      Swallow Study   General Date of Onset: 12/12/24 HPI: 84yo female admitted from home 12/12/24 after a fall.  CTHead/CXR: negative for acute findings. 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 Lives with husband at home. PMH: anemia, anxiety, depression, CAD,  CKD3, chronic nausea, migraine, diastolic heart failure, GERD, IBS, bil hearing aids. Type of Study: Bedside Swallow Evaluation Previous Swallow Assessment: none. SLE 2016 indicated significant memory issues. Diet Prior to this Study: Dysphagia 3 (mechanical soft);Thin liquids (Level 0) Temperature Spikes Noted: No Respiratory Status: Room air History of Recent Intubation: No Behavior/Cognition: Alert;Cooperative;Pleasant mood Oral Cavity Assessment: Within Functional Limits Oral Care Completed by SLP: Yes Oral Cavity - Dentition: Adequate natural dentition Vision: Functional for self-feeding Self-Feeding Abilities: Able to feed self Patient Positioning: Upright in chair Baseline Vocal Quality: Normal Volitional Cough: Strong Volitional Swallow: Able to elicit    Oral/Motor/Sensory Function Overall Oral Motor/Sensory Function: Within functional limits   Ice Chips  Not tested  Thin Liquid Thin Liquid: Within functional limits Presentation: Cup;Straw Other Comments: passed 3oz water  challenge without overt s/s aspiraiton     Nectar Thick Nectar Thick Liquid: Not tested   Honey Thick Honey Thick Liquid: Not tested   Puree Puree: Within functional limits Presentation: Self Fed;Spoon   Solid     Solid: Within functional limits Presentation: Self Fed     Macaela Presas B. Dory, MSP, CCC-SLP Speech Language Pathologist Office: [phone]  Dory Caprice Daring 12/13/2024,10:39 AM

## 2024-12-13 NOTE — Plan of Care (Signed)

## 2024-12-13 NOTE — Evaluation (Signed)
 Occupational Therapy Evaluation Patient Details Name: Danielle Gaines MRN: [id number] DOB: 10-06-1941 Today's Date: 12/13/2024   History of Present Illness   Pt is an 84 yo female who [facility] on 1/14 via EMS following fall in the home in which she hit her head and lost consciousness. Pt also reporting nausea and vomiting for the past 3 weeks with dry cough, self-reported fever, and decreased appetite. 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 However, pt reports at least 8 falls in the past 3 months. Pt now presents with impaired cardiopulmonary status, decreased activity tolerance, decreased B UE strength, impaired cognition (suspect at or near baseline), low vision (at baseline), and decreased safety and independence with functional tasks. Pt currently demonstrating ability to complete ADLs largely with Set up to Contact guard assist and functional transfers/mobility with a RW with Contact guard assist for safety. Pt participated well, but was limited this session by episode of orthostatic hypotension with RN notified. Pt HR in the 80s and O2 sat >/97% on RA throughout session. Pt will benefit from acute OT services to address deficits and increase safety and independence with functional tasks. Post acute discharge, OT recommends continued 24/7 supervision/assistance of family for safety. No post acute skilled OT needs are anticipated.   Unable to obtain BP in supine as pt already sitting upon arrival BP in sitting: 120/60 BP upon standing: 76/46 (57) BP in standing at 3 minutes:  103/41 (57) Pt reporting feeling of being swimmy headed and like her leg might go out with prolonged standing.     If plan is discharge home, recommend the following:   A little help with walking and/or transfers;A little help with bathing/dressing/bathroom;Assistance with cooking/housework;Direct supervision/assist for medications management;Direct supervision/assist for financial management;Assist for transportation;Help with stairs or ramp for entrance;Supervision due to cognitive status     Functional Status Assessment   Patient has had a recent decline in their functional status and demonstrates the ability to make significant improvements in function in a reasonable and predictable amount of time.     Equipment Recommendations   None recommended by OT (pt already has needed equipment)     Recommendations for Other Services         Precautions/Restrictions   Precautions Precautions: Fall;Other (comment) Precaution/Restrictions Comments: watch BP Restrictions Weight Bearing Restrictions Per Provider Order: No     Mobility Bed Mobility               General bed mobility comments: Pt sitting EOB upon arrival    Transfers Overall transfer level: Needs assistance Equipment used: Rolling walker (2 wheels) Transfers: Bed to chair/wheelchair/BSC, Sit to/from Stand Sit to Stand: Contact guard assist     Step pivot transfers: Contact guard assist     General transfer comment: cues for hand placement/technique and safety with RW; cues for orientation to the environment during transfers due to low vision      Balance Overall balance assessment: Needs assistance Sitting-balance support: Single extremity supported, Bilateral upper extremity supported, Feet unsupported, Feet supported Sitting balance-Leahy Scale: Good     Standing balance support: Single extremity supported, Bilateral upper  extremity supported, During functional activity, Reliant on  assistive device for balance Standing balance-Leahy Scale: Poor Standing balance comment: reliant on UE support                           ADL either performed or assessed with clinical judgement   ADL Overall ADL's : Needs assistance/impaired Eating/Feeding: Set up;Sitting Eating/Feeding Details (indicate cue type and reason): benefits from orientation to tray and use of brightly colored utensils, plate, cup, etc. due to low vision Grooming: Set up;Sitting;Contact guard assist;Standing Grooming Details (indicate cue type and reason): pt limited in standing due to episode of orthostatic hypotension in standing; benefits from orientation to placement of items due to low vision Upper Body Bathing: Set up;Supervision/ safety;Sitting Upper Body Bathing Details (indicate cue type and reason): benefits from orientation to placement of items due to low vision Lower Body Bathing: Set up;Supervison/ safety;Sitting/lateral leans;Contact guard assist;Sit to/from stand   Upper Body Dressing : Set up;Sitting   Lower Body Dressing: Supervision/safety;Set up;Sitting/lateral leans;Contact guard assist;Sit to/from stand   Toilet Transfer: Contact guard assist;Cueing for safety;Ambulation;BSC/3in1;Rolling walker (2 wheels) Toilet Transfer Details (indicate cue type and reason): cues for orientation to the environment due to low vision Toileting- Clothing Manipulation and Hygiene: Set up;Supervision/safety;Sitting/lateral lean;Contact guard assist;Sit to/from stand       Functional mobility during ADLs: Contact guard assist;Rolling walker (2 wheels);Cueing for safety (cues for orientation to the environment due to low vision; distance limited to approximately 20 ft due to episode of orthostatic hypotension) General ADL Comments: Pt with decreased activity tolerance during tasks     Vision Baseline Vision/History: 2 Legally blind;6 Macular Degeneration Ability to See in Adequate Light: 3 Highly  impaired Patient Visual Report: No change from baseline Additional Comments: Pt leagally blind due to macular degeneration at baseline. Pt able to see the outline of large objects, larger blocks of color, and objects with high contrast (i.e. can locate and grasp a white cup on a brown table). Pt reports no change from baseline.     Perception         Praxis         Pertinent Vitals/Pain Pain Assessment Pain Assessment: Faces Faces Pain Scale: Hurts a little bit Pain Location: headache Pain Descriptors / Indicators: Aching Pain Intervention(s): Monitored during session (Pt with chronic headaches and reports current headache is not unusual or different from her typical headaches)     Extremity/Trunk Assessment Upper Extremity Assessment Upper Extremity Assessment: Right hand dominant (gross B UE strength 4/5; B UE AROM, coordination, and sensation WFL)   Lower Extremity Assessment Lower Extremity Assessment: Defer to PT evaluation   Cervical / Trunk Assessment Cervical / Trunk Assessment: Other exceptions Cervical / Trunk Exceptions: Pt reports hx of coccyx fx several years ago that still causes intermittent pain with prolonged sitting   Communication Communication Communication: Impaired Factors Affecting Communication: Other (comment);Hearing impaired (requires increased time for word finding and mildly increased time for processing; has hearing aids)   Cognition Arousal: Alert Behavior During Therapy: WFL for tasks assessed/performed Cognition: Cognition impaired, History of cognitive impairments, No family/caregiver present to determine baseline     Awareness: Intellectual awareness intact, Online awareness impaired Memory impairment (select all impairments): Short-term memory, Working civil service fast streamer, Conservation officer, historic buildings (requires increased time for declarative long-term memory) Attention impairment (select first level of impairment): Alternating attention Executive  functioning impairment (select all impairments): Organization, Reasoning, Problem solving OT - Cognition Comments: Pt AAOx4 and  pleasant throughout session with deficits noted above. Pt with known cognitive impairments at baseline and has 24/7 supervision/assistance of family at baseline. Suspect pt congition is at or near baseline.                 Following commands: Impaired Following commands impaired: Only follows one step commands consistently, Follows multi-step commands inconsistently, Follows multi-step commands with increased time     Cueing  General Comments   Cueing Techniques: Verbal cues;Tactile cues  HR in the 80s and O2 sat >/97% on RA. Episode of orthostatic hypotnesion during session with RN notified   Exercises     Shoulder Instructions      Home Living Family/patient expects to be discharged to:: Private residence Living Arrangements: Spouse/significant other Available Help at Discharge: Family;Available 24 hours/day Type of Home: House Home Access: Stairs to enter [employer] of Steps: about 5 at the garage (about 3 at the front door) Entrance Stairs-Rails: Can reach both;Right;Left (at the garage, which is the door pt uses) Home Layout: One level     Bathroom Shower/Tub: Producer, Television/film/video: Standard Bathroom Accessibility: Yes How Accessible: Accessible via walker Home Equipment: Cane - quad;Shower seat;Grab bars - tub/shower;Hand held shower head;Other (comment) (triangular Rollator)   Additional Comments: Has a 3 lbs dog      Prior Functioning/Environment Prior Level of Function : Needs assist;History of Falls (last six months)             Mobility Comments: Pt uses a triangular Rollator in the home and community, Pt reports at least 8 falls in the past 3 months with pt reporting falls happen at all times of days and in various places, but she usually feels swimmy headed and like her leg are giving out. ADLs  Comments: Pt is largely Mod I to Set up for ADLs and receives assistance from husband for all IADLs, including medication management, meal prep, other home management tasks, and transportation. Pt enjoys spending time with her husband and dog.    OT Problem List: Decreased strength;Decreased activity tolerance;Impaired balance (sitting and/or standing);Decreased safety awareness;Decreased knowledge of use of DME or AE;Cardiopulmonary status limiting activity   OT Treatment/Interventions: Self-care/ADL training;Therapeutic exercise;DME and/or AE instruction;Therapeutic activities;Cognitive remediation/compensation;Patient/family education;Balance training;Visual/perceptual remediation/compensation      OT Goals(Current goals can be found in the care plan section)   Acute Rehab OT Goals Patient Stated Goal: to stop falling, feel better, and return home OT Goal Formulation: With patient Time For Goal Achievement: 12/27/24 Potential to Achieve Goals: Good ADL Goals Pt Will Perform Grooming: with set-up;standing Pt Will Perform Lower Body Bathing: with supervision;sit to/from stand Pt Will Perform Lower Body Dressing: with set-up;sit to/from stand Pt Will Transfer to Toilet: with set-up;with supervision;ambulating;regular height toilet (with least restrictive AD) Additional ADL Goal #1: Patient will demonstrate ability to participate in functional or therapeutic tasks for 10 or more minutes without needing a rest break to increase safety and independence with functional tasks. Additional ADL Goal #2: Patient will demonstrate understanding through teach back of education in strategies for monitoring, recognizing, and approriately responding to episodes of orthostatic hypotension with handout provided and family training as needed.   OT Frequency:  Min 1X/week    Co-evaluation              AM-PAC OT 6 Clicks Daily Activity     Outcome Measure Help from another person eating meals?: A  Little Help from another person taking care of personal grooming?: A  Little Help from another person toileting, which includes using toliet, bedpan, or urinal?: A Little Help from another person bathing (including washing, rinsing, drying)?: A Little Help from another person to put on and taking off regular upper body clothing?: A Little Help from another person to put on and taking off regular lower body clothing?: A Little 6 Click Score: 18   End of Session Equipment Utilized During Treatment: Gait belt;Rolling walker (2 wheels) Nurse Communication: Mobility status;Other (comment) (Pt with episode of orthostatic hypotension; OT POC)  Activity Tolerance: Patient tolerated treatment well;Treatment limited secondary to medical complications (Comment) (pt limited by episode of orthostatic hypotension) Patient left: in chair;with call bell/phone within reach;with chair alarm set  OT Visit Diagnosis: Unsteadiness on feet (R26.81);Repeated falls (R29.6)                Time: 9080-8999 OT Time Calculation (min): 41 min Charges:  OT General Charges $OT Visit: 1 Visit OT Evaluation $OT Eval Low Complexity: 1 Low OT Treatments $Self Care/Home Management : 23-37 mins  Margarie Rockey HERO., OTR/L, MA Acute Rehab [phone]   Margarie FORBES Horns 12/13/2024, 10:57 AM

## 2024-12-13 NOTE — Progress Notes (Addendum)
 " PROGRESS NOTE    Danielle Gaines  FMW:[id number] DOB: Oct 09, 1941 DOA: 12/12/2024 PCP: Ozell Heron HERO, MD   Brief Narrative:  84 years old female with past medical history of anemia, depression, diastolic heart failure, GERD presented to hospital with after sustaining a fall after her legs gave out. Patient was noted to have a CBC with a leukocytosis with WBC at 16.2, BMP showed sodium low at 129 with elevated creatinine at 2.2 from baseline 0.9 almost 3 months back.  Anion gap was 18.  Urinalysis did not show any evidence of infection.  Urine drug screen was positive for benzodiazepines.  CT head scan did not show any acute findings. CT of the abdomen and pelvis did not show any acute findings as well.  Chest x-ray and x-ray of the hip were not remarkable for acute findings.  EKG showed sinus rhythm.   Hyponatremia and kidney function are improving. PT/OT pending Lives at home with her husband.   Assessment & Plan:  Principal Problem:   AKI (acute kidney injury) Active Problems:   Anxiety and depression - managed at [facility]   GERD (gastroesophageal reflux disease)   Chronic kidney disease (CKD), stage III (moderate) (HCC)   Chronic nausea   Recurrent falls, head trauma likely secondary to volume depletion and dehydration.  Trauma imaging was negative for acute fractures.   Orthostatic hypotension,POA: holding amlodipine  PT OT evaluations pending.   Received IVF  Patient on multiple sedating medications at home including Wellbutrin  Celexa  Ativan  and trazodone  and Adderall.     Acute kidney injury secondary to volume depletion and prerenal in etiology. Improving Patient presented with creatinine of 2.2 from baseline 0.9 almost 3 months back. Creatinine today is 1.45.  Likely secondary poor oral intake/appetite.  Received IV hydration, will stop today.  Hold off lasix   Non-anion gap metabolic acidosis: continue with sodium bicarbonate  650 mg PO BID. F/u BMP in am   History of  headache from migraine.  Follows up with neurology as outpatient. On Ubrelvy  and Quilipta   History of depression and mood disorder.  On multiple medication including Wellbutrin , Celexa , Ativan , trazodone  and Adderall.    History of chronic nausea and vomiting.  On Creon  at home.  Continue Pepcid .     Failure to thrive and weight loss.  Poor oral intake.  Will get nutrition consultation.  Patient stated that she did have a EGD almost 1 year back and nothing significant was found.   Hypovolemic hyponatremia,POA: Improving Initial sodium was 129.  Patient received 2 L of IV fluid bolus in the ED.  Stopped IVF on 1/15 Sodium level today is 132. F/u BMP in am.   Leukocytosis, improving Likely reactive.  F/u CBC in AM.   Chronic diastolic heart failure. Not in exacerbation Heart healthy diet Holding lasix    History of GERD.  Continue Protonix .   History of hypertension.  On amlodipine    Hyperlipidemia.  Resumed lipitor 40 mg daily.    Disposition: Home with husband.   DVT prophylaxis: heparin  injection 5,000 Units Start: 12/12/24 1715     Code Status: Limited: Do not attempt resuscitation (DNR) -DNR-LIMITED -Do Not Intubate/DNI  Family Communication:  None at the bedside Status is: Observation The patient remains OBS appropriate and will d/c before 2 midnights.    Subjective:  Complaining of weakness in her legs that has been going on for almost 3 months now. She told me that she walks with a help of a walker at home.  Examination:  General exam: Appears calm and comfortable  Respiratory system: Clear to auscultation. Respiratory effort normal. Cardiovascular system: S1 & S2 heard, RRR. No JVD, murmurs, rubs, gallops or clicks. No pedal edema. Gastrointestinal system: Abdomen is nondistended, soft and nontender. No organomegaly or masses felt. Normal bowel sounds heard. Central nervous system: Alert and oriented. No focal neurological deficits. Extremities: Symmetric 5 x  5 power. Skin: No rashes, lesions or ulcers Psychiatry: Judgement and insight appear normal. Mood & affect appropriate.      Diet Orders (From admission, onward)     Start     Ordered   12/12/24 1703  DIET SOFT Room service appropriate? Yes; Fluid consistency: Thin  Diet effective now       Question Answer Comment  Room service appropriate? Yes   Fluid consistency: Thin      12/12/24 1704            Objective: Vitals:   12/13/24 0041 12/13/24 0457 12/13/24 0500 12/13/24 0808  BP: 122/60 (!) 118/50  (!) 121/59  Pulse: 85 80  89  Resp: 20 20  18   Temp: 98.8 F (37.1 C) 98.6 F (37 C)  98.8 F (37.1 C)  TempSrc: Oral Oral  Oral  SpO2: 97% 97%  97%  Weight:   58.6 kg   Height:        Intake/Output Summary (Last 24 hours) at 12/13/2024 1004 Last data filed at 12/12/2024 1650 Gross per 24 hour  Intake 2000 ml  Output --  Net 2000 ml   Filed Weights   12/12/24 1248 12/13/24 0500  Weight: 62 kg 58.6 kg    Scheduled Meds:  amLODipine   2.5 mg Oral Daily   buPROPion   300 mg Oral q morning   cycloSPORINE   1 drop Both Eyes BID   dorzolamide -timolol   1 drop Both Eyes BID   famotidine   40 mg Oral Daily   folic acid   1 mg Oral Daily   heparin   5,000 Units Subcutaneous Q8H   latanoprost   1 drop Both Eyes QHS   linaclotide   290 mcg Oral Daily   lipase/protease/amylase  36,000 Units Oral TID WC   olopatadine   1 drop Both Eyes BID   pantoprazole   40 mg Oral Daily   sodium chloride  flush  3 mL Intravenous Q12H   tamsulosin   0.4 mg Oral Daily   Continuous Infusions:  sodium chloride  75 mL/hr at 12/12/24 2228   sodium chloride      promethazine  (PHENERGAN ) injection (IM or IVPB)      Nutritional status     Body mass index is 24.41 kg/m.  Data Reviewed:   CBC: Recent Labs  Lab 12/12/24 1347 12/12/24 1821 12/13/24 0233  WBC 16.2*  --  13.6*  NEUTROABS 13.5*  --   --   HGB 14.4 13.6 10.4*  HCT 41.4 40.0 29.4*  MCV 102.2*  --  100.7*  PLT 350  --  301    Basic Metabolic Panel: Recent Labs  Lab 12/12/24 1347 12/12/24 1821 12/13/24 0233  NA 129* 135 132*  K 4.3 3.8 4.1  CL 98  --  106  CO2 13*  --  15*  GLUCOSE 113*  --  104*  BUN 62*  --  44*  CREATININE 2.22*  --  1.45*  CALCIUM  10.3  --  9.0  MG  --   --  1.7   GFR: Estimated Creatinine Clearance: 25 mL/min (A) (by C-G formula based on SCr of 1.45 mg/dL (H)). Liver Function Tests:  Recent Labs  Lab 12/12/24 1347  AST 18  ALT 19  ALKPHOS 102  BILITOT 0.4  PROT 7.9  ALBUMIN 4.3   No results for input(s): LIPASE, AMYLASE in the last 168 hours. No results for input(s): AMMONIA in the last 168 hours. Coagulation Profile: No results for input(s): INR, PROTIME in the last 168 hours. Cardiac Enzymes: Recent Labs  Lab 12/12/24 1814  CKTOTAL 66   BNP (last 3 results) No results for input(s): PROBNP in the last 8760 hours. HbA1C: No results for input(s): HGBA1C in the last 72 hours. CBG: Recent Labs  Lab 12/12/24 1342  GLUCAP 129*   Lipid Profile: No results for input(s): CHOL, HDL, LDLCALC, TRIG, CHOLHDL, LDLDIRECT in the last 72 hours. Thyroid  Function Tests: No results for input(s): TSH, T4TOTAL, FREET4, T3FREE, THYROIDAB in the last 72 hours. Anemia Panel: No results for input(s): VITAMINB12, FOLATE, FERRITIN, TIBC, IRON, RETICCTPCT in the last 72 hours. Sepsis Labs: Recent Labs  Lab 12/12/24 1821  LATICACIDVEN 2.0*    Recent Results (from the past 240 hours)  Resp panel by RT-PCR (RSV, Flu A&B, Covid) Anterior Nasal Swab     Status: None   Collection Time: 12/12/24  2:43 PM   Specimen: Anterior Nasal Swab  Result Value Ref Range Status   SARS Coronavirus 2 by RT PCR NEGATIVE NEGATIVE Final   Influenza A by PCR NEGATIVE NEGATIVE Final   Influenza B by PCR NEGATIVE NEGATIVE Final    Comment: (NOTE) The Xpert Xpress SARS-CoV-2/FLU/RSV plus assay is intended as an aid in the diagnosis of influenza from  Nasopharyngeal swab specimens and should not be used as a sole basis for treatment. Nasal washings and aspirates are unacceptable for Xpert Xpress SARS-CoV-2/FLU/RSV testing.  Fact Sheet for Patients: bloggercourse.com  Fact Sheet for Healthcare Providers: seriousbroker.it  This test is not yet approved or cleared by the [place]  FDA and has been authorized for detection and/or diagnosis of SARS-CoV-2 by FDA under an Emergency Use Authorization (EUA). This EUA will remain in effect (meaning this test can be used) for the duration of the COVID-19 declaration under Section 564(b)(1) of the Act, 21 U.S.C. section 360bbb-3(b)(1), unless the authorization is terminated or revoked.     Resp Syncytial Virus by PCR NEGATIVE NEGATIVE Final    Comment: (NOTE) Fact Sheet for Patients: bloggercourse.com  Fact Sheet for Healthcare Providers: seriousbroker.it  This test is not yet approved or cleared by the [place]  FDA and has been authorized for detection and/or diagnosis of SARS-CoV-2 by FDA under an Emergency Use Authorization (EUA). This EUA will remain in effect (meaning this test can be used) for the duration of the COVID-19 declaration under Section 564(b)(1) of the Act, 21 U.S.C. section 360bbb-3(b)(1), unless the authorization is terminated or revoked.  Performed at [facility], [address] [place], [place] [address]          Radiology Studies: CT ABDOMEN PELVIS WO CONTRAST Result Date: 12/12/2024 EXAM: CT ABDOMEN AND PELVIS WITHOUT CONTRAST 12/12/2024 03:19:00 PM TECHNIQUE: CT of the abdomen and pelvis was performed without the administration of intravenous contrast. Multiplanar reformatted images are provided for review. Automated exposure control, iterative reconstruction, and/or weight-based adjustment of the mA/kV was utilized to reduce the radiation  dose to as low as reasonably achievable. COMPARISON: 12/12/2024 CLINICAL HISTORY: Abdominal pain, acute, nonlocalized. FINDINGS: LOWER CHEST: No acute abnormality. LIVER: The liver is unremarkable. GALLBLADDER AND BILE DUCTS: Status post cholecystectomy. No biliary ductal dilatation. SPLEEN: Calcified granuloma noted within  the spleen. PANCREAS: The pancreas is normal in size and contour without focal lesion or ductal dilatation. ADRENAL GLANDS: Normal size and morphology bilaterally. No nodule, thickening, or hemorrhage. No periadrenal stranding. KIDNEYS, URETERS AND BLADDER: Small bilateral renal calculi measuring up to 2 to 3 mm noted. Similar appearance of chronic bilateral pelvocaliectasis. No stones identified along the course of either ureter. No perinephric or periureteral stranding. The bladder appears normal. GI AND BOWEL: The stomach is within normal limits. Assessment of bowel pathology is diminished due to lack of intravenous and oral contrast material. 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 There is no pathologic dilatation of the bowel loops proximal to the anastomosis. No bowel wall thickening or inflammation. Gaseous distention of the bowel at the level of the anastomosis is noted. There is no bowel obstruction. PERITONEUM AND RETROPERITONEUM: There is no free fluid or fluid collections within the abdomen or pelvis. No signs of pneumoperitoneum. VASCULATURE: Aorta is normal in caliber. Aortic atherosclerotic calcification. LYMPH NODES: No abdominal or pelvic adenopathy. REPRODUCTIVE ORGANS: Status post hysterectomy. No adnexal mass. BONES AND SOFT TISSUES: No acute osseous abnormality. No focal soft tissue abnormality. IMPRESSION: 1. No acute findings in the abdomen or pelvis to explain the pain. 2. No evidence of bowel obstruction or inflammatory bowel process; gaseous distention at the sigmoid anastomosis may reflect functional ileus  versus anastomotic narrowing. Electronically signed by: Waddell Calk MD 12/12/2024 03:35 PM EST RP Workstation: [id number]   DG Hip Unilat W or Wo Pelvis 2-3 Views Left Result Date: 12/12/2024 CLINICAL DATA:  Fall EXAM: DG HIP (WITH OR WITHOUT PELVIS) 2-3V LEFT COMPARISON:  Apr 15, 2021 FINDINGS: There is no evidence of hip fracture or dislocation. Moderate narrowing and osteophyte formation of left hip is noted. 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 No acute abnormality seen. Electronically Signed   By: Lynwood Landy Raddle M.D.   On: 12/12/2024 15:05   DG Chest Portable 1 View Result Date: 12/12/2024 CLINICAL DATA:  Fall after syncope EXAM: PORTABLE CHEST 1 VIEW COMPARISON:  August 27, 2024 FINDINGS: The heart size and mediastinal contours are within normal limits. Both lungs are clear. The visualized skeletal structures are unremarkable. IMPRESSION: No active disease. Electronically Signed   By: Lynwood Landy Raddle M.D.   On: 12/12/2024 15:04   CT HEAD WO CONTRAST Result Date: 12/12/2024 EXAM: CT HEAD AND CERVICAL SPINE 12/12/2024 01:37:39 PM TECHNIQUE: CT of the head and cervical spine was performed without the administration of intravenous contrast. Multiplanar reformatted images are provided for review. Automated exposure control, iterative reconstruction, and/or weight based adjustment of the mA/kV was utilized to reduce the radiation dose to as low as reasonably achievable. COMPARISON: None available. CLINICAL HISTORY: Polytrauma, blunt The patient sustained polytrauma due to blunt force. FINDINGS: CT HEAD BRAIN AND VENTRICLES: No acute intracranial hemorrhage. No mass effect or midline shift. No abnormal extra-axial fluid collection. No evidence of acute infarct. No hydrocephalus. ORBITS: No acute abnormality. Right glaucoma shunt. SINUSES AND MASTOIDS: No acute abnormality. SOFT TISSUES AND SKULL: No acute skull fracture. No acute soft tissue abnormality. CT CERVICAL SPINE BONES  AND ALIGNMENT: No acute fracture or traumatic malalignment. DEGENERATIVE CHANGES: Moderate to severe degenerative change including degenerative disc disease greatest at C5-C6 and C6-C7, and multilevel right greater than left facet uncovertebral hypertrophy. Resulting varying degrees of neural foraminal stenosis, right greater than left. SOFT TISSUES: No prevertebral soft tissue swelling. IMPRESSION: 1. No acute intracranial abnormality. 2. No  acute fracture or traumatic malalignment of the cervical spine. Electronically signed by: Glendia Molt MD 12/12/2024 02:08 PM EST RP Workstation: [id number]   CT CERVICAL SPINE WO CONTRAST Result Date: 12/12/2024 EXAM: CT HEAD AND CERVICAL SPINE 12/12/2024 01:37:39 PM TECHNIQUE: CT of the head and cervical spine was performed without the administration of intravenous contrast. Multiplanar reformatted images are provided for review. Automated exposure control, iterative reconstruction, and/or weight based adjustment of the mA/kV was utilized to reduce the radiation dose to as low as reasonably achievable. COMPARISON: None available. CLINICAL HISTORY: Polytrauma, blunt The patient sustained polytrauma due to blunt force. FINDINGS: CT HEAD BRAIN AND VENTRICLES: No acute intracranial hemorrhage. No mass effect or midline shift. No abnormal extra-axial fluid collection. No evidence of acute infarct. No hydrocephalus. ORBITS: No acute abnormality. Right glaucoma shunt. SINUSES AND MASTOIDS: No acute abnormality. SOFT TISSUES AND SKULL: No acute skull fracture. No acute soft tissue abnormality. CT CERVICAL SPINE BONES AND ALIGNMENT: No acute fracture or traumatic malalignment. DEGENERATIVE CHANGES: Moderate to severe degenerative change including degenerative disc disease greatest at C5-C6 and C6-C7, and multilevel right greater than left facet uncovertebral hypertrophy. Resulting varying degrees of neural foraminal stenosis, right greater than left. SOFT TISSUES: No prevertebral  soft tissue swelling. IMPRESSION: 1. No acute intracranial abnormality. 2. No acute fracture or traumatic malalignment of the cervical spine. Electronically signed by: Glendia Molt MD 12/12/2024 02:08 PM EST RP Workstation: [id number]           LOS: 0 days   Time spent= 35 mins    Deliliah Room, MD [facility]  If 7PM-7AM, please contact night-coverage  12/13/2024, 10:04 AM  "

## 2024-12-13 NOTE — Progress Notes (Signed)
 Requisition for TED hose sent per unit secretary pending availability.

## 2024-12-13 NOTE — Progress Notes (Signed)
 Transition of Care [facility]) - Inpatient Brief Assessment   Patient Details  Name: Danielle Gaines MRN: [id number] Date of Birth: August 30, 1941  [facility] CM/SW Contact:    Rosaline JONELLE Joe, RN Phone Number: 12/13/2024, 4:19 PM   Clinical Narrative: CM met with the patient and spouse at the bedside to discuss IP Care management needs.  The patient admitted to the hospital with AKI, volume depletion and fall.  The patient lives with the spouse in the home and plans to return home when stable.  DME in the home includes quad cane, shower seat and Tripod rolator.  The patient's spouse was offered choice regarding HH services and chose [facility].  [facility] accepted for services.  HH orders for PT was placed to be co-signed by MD.  Resources for needed SDOH was included in the AVS for senior support in the home.  Patient's PCP is listed and spouse plans to schedule hospital follow up in the next 7-10 days.  Spouse provided transportation to appointments by car.   Transition of Care Asessment: Insurance and Status: (P) Insurance coverage has been reviewed Patient has primary care physician: (P) Yes Home environment has been reviewed: (P) from home with spouse Prior level of function:: (P) self Prior/Current Home Services: (P) No current home services Social Drivers of Health Review: (P) SDOH reviewed interventions complete Readmission risk has been reviewed: (P) Yes Transition of care needs: (P) transition of care needs identified, TOC will continue to follow

## 2024-12-13 NOTE — Discharge Instructions (Signed)

## 2024-12-13 NOTE — Evaluation (Signed)
 Physical Therapy Evaluation Patient Details Name: Danielle Gaines MRN: [id number] DOB: [birth year] Today's Date: 12/13/2024  History of Present Illness  Pt is an 84 yo female who presented to [facility] on 1/14 via EMS following fall in the home in which she hit her head and lost consciousness. Pt also reporting nausea and vomiting for the past 3 weeks with dry cough, self-reported fever, and decreased appetite. 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  Patient educated on slow to rise and using compression socks at home (states she has some).  She will benefit from skilled PT in the acute setting and from HHPT at d/c.         If plan is discharge home, recommend the following: A little help with walking and/or transfers;Supervision due to cognitive status;Help with stairs or ramp for entrance   Can travel by private vehicle        Equipment Recommendations None recommended by PT  Recommendations for Other Services       Functional Status Assessment Patient has had a recent decline in their functional status and demonstrates the ability to make significant improvements in function in a reasonable and predictable amount of time.     Precautions / Restrictions Precautions Precautions: Fall;Other (comment) Precaution/Restrictions Comments: watch BP      Mobility  Bed Mobility               General bed mobility comments: in chair    Transfers   Equipment used: Rolling walker (2 wheels) Transfers: Sit to/from Stand Sit to Stand: Contact  guard assist           General transfer comment: cues for scooting to edge of chair, increased time, reviewed education on standing for a few moments prior to walking to ensure not feeling light headed    Ambulation/Gait Ambulation/Gait assistance: Contact guard assist Gait Distance (Feet): 40 Feet Assistive device: Rolling walker (2 wheels) Gait Pattern/deviations: Step-to pattern, Decreased stride length, Shuffle       General Gait Details: slower pace, assist for safety, questioned throughout for symptoms of dizziness; and continued to report feeling ok  Stairs            Wheelchair Mobility     Tilt Bed    Modified Rankin (Stroke Patients Only)       Balance Overall balance assessment: Needs assistance   Sitting balance-Leahy Scale: Good     Standing balance support: Bilateral upper extremity supported Standing balance-Leahy Scale: Poor Standing balance comment: reliant on UE support                             Pertinent Vitals/Pain Pain Assessment Pain Assessment: No/denies pain    Home Living Family/patient expects to be discharged to:: Private residence Living Arrangements: Spouse/significant other Available Help at Discharge: Family;Available 24 hours/day Type of Home: House Home Access: Level entry Entrance Stairs-Rails: Can reach both;Right;Left (at the garage, which is the door pt uses) Entrance Stairs-Number of Steps: about 5 at the garage (about 3 at the front door)   Home Layout: One level Home Equipment: [place] -  quad;Shower seat;Grab bars - tub/shower;Hand held shower head;Other (comment) Additional Comments: Has a 3 lbs dog; tripod rollator    Prior Function Prior Level of Function : Needs assist;History of Falls (last six months)             Mobility Comments: Pt uses a triangular Rollator in the home and community, Pt reports at least 8 falls in the past 3 months with pt reporting falls happen at all times of days  and in various places, but she usually feels swimmy headed and like her leg are giving out. ADLs Comments: Pt is largely Mod I to Set up for ADLs and receives assistance from husband for all IADLs, including medication management, meal prep, other home management tasks, and transportation. Pt enjoys spending time with her husband and dog.     Extremity/Trunk Assessment   Upper Extremity Assessment Upper Extremity Assessment: Defer to OT evaluation    Lower Extremity Assessment Lower Extremity Assessment: Generalized weakness    Cervical / Trunk Assessment Cervical / Trunk Assessment: Normal Cervical / Trunk Exceptions: Pt reports hx of coccyx fx several years ago that still causes intermittent pain with prolonged sitting  Communication   Communication Communication: Impaired Factors Affecting Communication: Hearing impaired    Cognition Arousal: Alert Behavior During Therapy: WFL for tasks assessed/performed   PT - Cognitive impairments: Memory                       PT - Cognition Comments: h/o memory issues Following commands: Impaired Following commands impaired: Only follows one step commands consistently, Follows multi-step commands with increased time     Cueing Cueing Techniques: Verbal cues     General Comments General comments (skin integrity, edema, etc.): HR 80's SpO2 on RA 99%; did not check BP as OT just checked noting significant drop initial standing and some recovery with 3 minutes standing.  Discussed using compression stockings and pt relates has some at home. Reinforced education on slow to rise and sitting if feeling weak and potentially placing chairs around for her to sit if feeling weak to avoid falls    Exercises     Assessment/Plan    PT Assessment Patient needs continued PT services  PT Problem List Decreased balance;Decreased strength;Decreased activity tolerance;Decreased mobility;Decreased safety awareness;Decreased knowledge of  precautions       PT Treatment Interventions DME instruction;Gait training;Therapeutic activities;Functional mobility training;Balance training;Therapeutic exercise;Patient/family education    PT Goals (Current goals can be found in the Care Plan section)  Acute Rehab PT Goals Patient Stated Goal: return to independent, reduce falls PT Goal Formulation: With patient/family Time For Goal Achievement: 12/27/24 Potential to Achieve Goals: Good    Frequency Min 2X/week     Co-evaluation               AM-PAC PT 6 Clicks Mobility  Outcome Measure Help needed turning from your back to your side while in a flat bed without using bedrails?: A Little Help needed moving from lying on your back to sitting on the side of a flat bed without using bedrails?: A Little Help needed moving to and from a bed to a chair (including a wheelchair)?: A Little Help needed standing up from a chair using your arms (e.g., wheelchair or bedside chair)?: A Little Help needed to walk in hospital room?: A Little Help needed climbing 3-5 steps with a railing? : A Lot 6 Click Score: 17    End of Session Equipment Utilized During  Treatment: Gait belt Activity Tolerance: Patient limited by fatigue Patient left: in chair;with call bell/phone within reach;with chair alarm set;with family/visitor present   PT Visit Diagnosis: Other abnormalities of gait and mobility (R26.89);History of falling (Z91.81);Muscle weakness (generalized) (M62.81)    Time: 8964-8940 PT Time Calculation (min) (ACUTE ONLY): 24 min   Charges:   PT Evaluation $PT Eval Moderate Complexity: 1 Mod PT Treatments $Gait Training: 8-22 mins PT General Charges $$ ACUTE PT VISIT: 1 Visit         Micheline Portal, PT Acute Rehabilitation Services Office:[phone] 12/13/2024]   Montie Portal 12/13/2024, 1:17 PM

## 2024-12-13 NOTE — Care Management Obs Status (Signed)
 MEDICARE OBSERVATION STATUS NOTIFICATION   Patient Details  Name: Danielle Gaines MRN: [id number] Date of Birth: 11-06-1941   Medicare Observation Status Notification Given:     Verbally reviewed observation notice with Ronal Sable telephonically at [phone].  Well deliverer a copy to the patients room.   Shauntelle Jamerson 12/13/2024, 11:30 AM

## 2024-12-14 DIAGNOSIS — N179 Acute kidney failure, unspecified: Secondary | ICD-10-CM | POA: Diagnosis not present

## 2024-12-14 LAB — BASIC METABOLIC PANEL WITH GFR
Anion gap: 10 (ref 5–15)
BUN: 22 mg/dL (ref 8–23)
CO2: 17 mmol/L — ABNORMAL LOW (ref 22–32)
Calcium: 9.1 mg/dL (ref 8.9–10.3)
Chloride: 108 mmol/L (ref 98–111)
Creatinine, Ser: 0.9 mg/dL (ref 0.44–1.00)
GFR, Estimated: >60 mL/min
Glucose, Bld: 105 mg/dL — ABNORMAL HIGH (ref 70–99)
Potassium: 3.9 mmol/L (ref 3.5–5.1)
Sodium: 134 mmol/L — ABNORMAL LOW (ref 135–145)

## 2024-12-14 LAB — CK: Total CK: 68 U/L (ref 38–234)

## 2024-12-14 MED ORDER — UBRELVY 100 MG PO TABS: 100.0000 mg | ORAL_TABLET | Freq: Every day | ORAL | Status: AC | PRN

## 2024-12-14 NOTE — Care Management Obs Status (Signed)
 MEDICARE OBSERVATION STATUS NOTIFICATION   Patient Details  Name: Danielle Gaines MRN: [id number] Date of Birth: 11/02/1941   Medicare Observation Status Notification Given:  Yes    Briann Sarchet Crawford 12/14/2024, 8:33 AM

## 2024-12-14 NOTE — Progress Notes (Signed)
 Physical Therapy Treatment Patient Details Name: Danielle Gaines MRN: [id number] DOB: Mar 02, 1941 Today's Date: 12/14/2024   History of Present Illness Pt is an 84 yo female who presented to [facility] on 1/14 via EMS following fall in the home in which she hit her head and lost consciousness. Pt also reporting nausea and vomiting for the past 3 weeks with dry cough, self-reported fever, and decreased appetite. 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 Pt able to tolerate increased gait distance, but reports fatigue and declines further mobility. Pt reports dizziness during ambulation, but this is baseline per pt. BP 124/63 upon return to the room. Some decreased safety awareness and memory noted requiring cues. Pt declines need for stair trial. Pt continues to benefit from PT services to progress toward functional mobility goals.    If plan is discharge home, recommend the following: A little help with walking and/or transfers;Supervision due to cognitive status;Help with stairs or ramp for entrance   Can travel by private vehicle        Equipment Recommendations  None recommended by PT    Recommendations for Other Services       Precautions / Restrictions Precautions Precautions: Fall;Other (comment) Precaution/Restrictions Comments: watch BP Restrictions Weight Bearing Restrictions Per Provider Order: No     Mobility  Bed Mobility               General bed mobility comments: Pt sitting EOB upon entry    Transfers Overall transfer level: Needs assistance Equipment used: Rolling walker (2 wheels) Transfers: Sit to/from Stand Sit to Stand: Contact guard assist            General transfer comment: From EOB with CGA for safety    Ambulation/Gait Ambulation/Gait assistance: Contact guard assist Gait Distance (Feet): 84 Feet Assistive device: Rolling walker (2 wheels) Gait Pattern/deviations: Decreased stride length, Step-through pattern, Narrow base of support       General Gait Details: Pt demonstrates slow gait with low foot clearance despite cues. Some decreased safety awareness with turns and distractions requiring CGA for safety   Stairs             Wheelchair Mobility     Tilt Bed    Modified Rankin (Stroke Patients Only)       Balance Overall balance assessment: Needs assistance Sitting-balance support: Single extremity supported, Bilateral upper extremity supported, Feet supported, Feet unsupported Sitting balance-Leahy Scale: Good Sitting balance - Comments: EOB   Standing balance support: Bilateral upper extremity supported, During functional activity, Reliant on assistive device for balance Standing balance-Leahy Scale: Poor Standing balance comment: reliant on RW support                            Communication Communication Communication: Impaired Factors Affecting Communication: Hearing impaired  Cognition Arousal: Alert Behavior During Therapy: WFL for tasks assessed/performed   PT - Cognitive impairments: Memory, History of cognitive impairments, Safety/Judgement                         Following commands: Impaired Following commands impaired: Only follows one step commands consistently, Follows multi-step commands with increased time    Cueing Cueing  Techniques: Verbal cues  Exercises      General Comments        Pertinent Vitals/Pain Pain Assessment Pain Assessment: No/denies pain     PT Goals (current goals can now be found in the care plan section) Acute Rehab PT Goals Patient Stated Goal: return to independent, reduce falls PT Goal Formulation: With patient/family Time  For Goal Achievement: 12/27/24 Progress towards PT goals: Progressing toward goals    Frequency    Min 2X/week       AM-PAC PT 6 Clicks Mobility   Outcome Measure  Help needed turning from your back to your side while in a flat bed without using bedrails?: A Little Help needed moving from lying on your back to sitting on the side of a flat bed without using bedrails?: A Little Help needed moving to and from a bed to a chair (including a wheelchair)?: A Little Help needed standing up from a chair using your arms (e.g., wheelchair or bedside chair)?: A Little Help needed to walk in hospital room?: A Little Help needed climbing 3-5 steps with a railing? : A Little 6 Click Score: 18    End of Session Equipment Utilized During Treatment: Gait belt Activity Tolerance: Patient limited by fatigue Patient left: with call bell/phone within reach;in bed;with bed alarm set (sitting EOB) Nurse Communication: Mobility status PT Visit Diagnosis: Other abnormalities of gait and mobility (R26.89);History of falling (Z91.81);Muscle weakness (generalized) (M62.81)     Time: 9154-9097 PT Time Calculation (min) (ACUTE ONLY): 17 min  Charges:    $Gait Training: 8-22 mins PT General Charges $$ ACUTE PT VISIT: 1 Visit                    Darryle George, PTA Acute Rehabilitation Services Secure Chat Preferred  [phone]    Darryle George 12/14/2024, 10:24 AM

## 2024-12-14 NOTE — Discharge Summary (Signed)
 " Physician Discharge Summary   Patient: Danielle Gaines MRN: [id number] DOB: [birth year]  Admit date:     12/12/2024  Discharge date: 12/14/24  Discharge Physician: Deliliah Room   PCP: Ozell Heron HERO, MD   Recommendations at discharge:    F/u with your PCP in one week Check BP and heart rate daily  Discharge Diagnoses: Principal Problem:   AKI (acute kidney injury) Active Problems:   Anxiety and depression - managed at [facility]   GERD (gastroesophageal reflux disease)   Chronic kidney disease (CKD), stage III (moderate) (HCC)   Chronic nausea   Hospital Course:   84 years old female with past medical history of anemia, depression, diastolic heart failure, GERD presented to hospital with after sustaining a fall after her legs gave out. Patient was noted to have a CBC with a leukocytosis with WBC at 16.2, BMP showed sodium low at 129 with elevated creatinine at 2.2 from baseline 0.9 almost 3 months back. Anion gap was 18. Urinalysis did not show any evidence of infection. Urine drug screen was positive for benzodiazepines. CT head scan did not show any acute findings. CT of the abdomen and pelvis did not show any acute findings as well. Chest x-ray and x-ray of the hip were not remarkable for acute findings. EKG showed sinus rhythm.   Recurrent falls, head trauma likely secondary to volume depletion and dehydration.  Trauma imaging was negative for acute fractures.  PT eval done. HHPT arranged.   Orthostatic hypotension,POA: held lasix  and amlodipine  PT OT evaluations done., HHPT arranged.  Received IVF  Patient on multiple sedating medications at home including Wellbutrin , Celexa  Ativan  and trazodone    Negative orthostatic vitals on 1/16 prior to her discharge.   Acute kidney injury secondary to volume depletion and prerenal in etiology. resolved Patient presented with creatinine of 2.2 from baseline 0.9 almost 3 months back. Creatinine today is 1.45.  Likely secondary poor  oral intake/appetite.  Received IV hydration, stopped on 12/13/24  Hold off lasix    Non-anion gap metabolic acidosis: continue with sodium bicarbonate  650 mg PO BID.   History of headache from migraine.  Follows up with neurology as outpatient. On Ubrelvy  and Quilipta   History of depression and mood disorder.  On multiple medication including Wellbutrin , Celexa , Ativan , trazodone  and Adderall.    History of chronic nausea and vomiting.  On Creon  at home.  Continue Pepcid .     Failure to thrive and weight loss.  Poor oral intake. Nutritionist consulted. Patient stated that she did have a EGD almost 1 year back and nothing significant was found.   Hypovolemic hyponatremia,POA: resolved Initial sodium was 129.  Patient received 2 L of IV fluid bolus in the ED.  Stopped IVF on 1/15 Sodium level on the day of the discharge is 134.    Leukocytosis, improving Likely reactive.    Chronic diastolic heart failure. Not in exacerbation Heart healthy diet Holding lasix  as patient has no evidence of volume overload/pulm edema and this is likely causing/worsening her orthostatic hypotension, hyponatremia and AKI.   History of GERD.  Continue Protonix .   History of hypertension.  On amlodipine    Hyperlipidemia.  Resumed lipitor 40 mg daily.    Disposition: Home with husband. HHS arranged.     Consultants: None Procedures performed: None  Disposition: Home health Diet recommendation:  Regular diet DISCHARGE MEDICATION: Allergies as of 12/14/2024       Reactions   Elemental Sulfur Other (See Comments)   Sulfa Antibiotics Swelling,  Other (See Comments)   Reaction:  Unspecified swelling reaction         Medication List     STOP taking these medications    amLODipine  2.5 MG tablet Commonly known as: NORVASC    amphetamine -dextroamphetamine  20 MG 24 hr capsule Commonly known as: ADDERALL XR   furosemide  20 MG tablet Commonly known as: LASIX    hyoscyamine  0.125 MG Tbdp  disintergrating tablet Commonly known as: ANASPAZ        TAKE these medications    acetaminophen  500 MG tablet Commonly known as: TYLENOL  Take 1,000 mg by mouth daily as needed for headache.   ALIGN PO Take 1 tablet by mouth daily.   atorvastatin  40 MG tablet Commonly known as: LIPITOR Take 1 tablet (40 mg total) by mouth daily.   baclofen 10 MG tablet Commonly known as: LIORESAL Take 10 mg by mouth at bedtime as needed for muscle spasms.   buPROPion  150 MG 24 hr tablet Commonly known as: WELLBUTRIN  XL Take 300 mg by mouth every morning.   citalopram  40 MG tablet Commonly known as: CELEXA  Take 40 mg by mouth daily.   Creon  36000-114000 units Cpep capsule Generic drug: lipase/protease/amylase Take 36,000 Units by mouth 3 (three) times daily.   cycloSPORINE  0.05 % ophthalmic emulsion Commonly known as: RESTASIS  Place 1 drop into both eyes 2 (two) times daily.   diclofenac 75 MG EC tablet Commonly known as: VOLTAREN Take 75 mg by mouth 2 (two) times daily as needed for moderate pain (pain score 4-6).   Dorzolamide  HCl-Timolol  Mal PF 2-0.5 % Soln INSTILL 1 DROP IN BOTH EYES TWICE DAILY   famotidine  40 MG tablet Commonly known as: PEPCID  Take 40 mg by mouth daily.   folic acid  1 MG tablet Commonly known as: FOLVITE  Take by mouth.   gabapentin 100 MG capsule Commonly known as: NEURONTIN Take 100 mg by mouth.   Linzess  290 MCG Caps capsule Generic drug: linaclotide  Take 290 mcg by mouth daily.   LORazepam  0.5 MG tablet Commonly known as: ATIVAN  Take 0.5 mg by mouth in the morning, at noon, and at bedtime. 1 in am 2 at hs   meclizine  25 MG tablet Commonly known as: ANTIVERT  Take 1 tablet (25 mg total) by mouth 3 (three) times daily as needed for dizziness.   meloxicam  15 MG tablet Commonly known as: MOBIC  Take 1 tablet (15 mg total) by mouth daily.   olopatadine  0.1 % ophthalmic solution Commonly known as: PATANOL Place 1 drop into both eyes daily  as needed for allergies.   omeprazole 40 MG capsule Commonly known as: PRILOSEC Take 40 mg by mouth 2 (two) times daily.   promethazine  12.5 MG tablet Commonly known as: PHENERGAN  Take 12.5 mg by mouth 3 (three) times daily as needed for vomiting.   Qulipta 60 MG Tabs Generic drug: Atogepant Take 60 mg by mouth daily.   sodium bicarbonate  650 MG tablet Take 650 mg by mouth 2 (two) times daily.   tamsulosin  0.4 MG Caps capsule Commonly known as: FLOMAX  Take 0.4 mg by mouth daily.   Travoprost (BAK Free) 0.004 % Soln ophthalmic solution Commonly known as: TRAVATAN Place 1 drop into the left eye 2 (two) times daily.   traZODone  50 MG tablet Commonly known as: DESYREL  Take 100 mg by mouth at bedtime as needed for sleep.   Ubrelvy  100 MG Tabs Generic drug: Ubrogepant  Take 1 tablet (100 mg total) by mouth daily as needed (for migraine).   Vitamin D  (Ergocalciferol ) 1.25 MG (  50000 UNIT) Caps capsule Commonly known as: DRISDOL  Take 50,000 Units by mouth once a week.        Contact information for follow-up providers     Ozell Heron HERO, MD. Schedule an appointment as soon as possible for a visit.   Specialty: Family Medicine Why: Call the office and schedule a hospital follow up in the next 7-10 days. Contact information: [address] [place] [address] [phone]              Contact information for after-discharge care     Home Medical Care     [facility] [place] [facility]) .   Service: Home Health Services Contact information: [address] [place]  [address] [phone]                    Discharge Exam: Fredricka Weights   12/12/24 1248 12/13/24 0500 12/14/24 0500  Weight: 62 kg 58.6 kg 58.1 kg   Constitutional: NAD, calm, comfortable Eyes: PERRL, lids and conjunctivae normal ENMT: Mucous membranes are moist. Posterior pharynx clear of any exudate or lesions.Normal dentition.  Neck:  normal, supple, no masses, no thyromegaly Respiratory: clear to auscultation bilaterally, no wheezing, no crackles. Normal respiratory effort. No accessory muscle use.  Cardiovascular: Regular rate and rhythm, no murmurs / rubs / gallops. No extremity edema. 2+ pedal pulses. No carotid bruits.  Abdomen: no tenderness, no masses palpated. No hepatosplenomegaly. Bowel sounds positive.  Musculoskeletal: no clubbing / cyanosis. No joint deformity upper and lower extremities. Good ROM, no contractures. Normal muscle tone.  Skin: no rashes, lesions, ulcers. No induration Neurologic: CN 2-12 grossly intact. Sensation intact, DTR normal. Strength 5/5 x all 4 extremities.  Psychiatric: Normal judgment and insight. Alert and oriented x 3. Normal mood.    Condition at discharge: good  The results of significant diagnostics from this hospitalization (including imaging, microbiology, ancillary and laboratory) are listed below for reference.   Imaging Studies: CT ABDOMEN PELVIS WO CONTRAST Result Date: 12/12/2024 EXAM: CT ABDOMEN AND PELVIS WITHOUT CONTRAST 12/12/2024 03:19:00 PM TECHNIQUE: CT of the abdomen and pelvis was performed without the administration of intravenous contrast. Multiplanar reformatted images are provided for review. Automated exposure control, iterative reconstruction, and/or weight-based adjustment of the mA/kV was utilized to reduce the radiation dose to as low as reasonably achievable. COMPARISON: 12/12/2024 CLINICAL HISTORY: Abdominal pain, acute, nonlocalized. FINDINGS: LOWER CHEST: No acute abnormality. LIVER: The liver is unremarkable. GALLBLADDER AND BILE DUCTS: Status post cholecystectomy. No biliary ductal dilatation. SPLEEN: Calcified granuloma noted within the spleen. PANCREAS: The pancreas is normal in size and contour without focal lesion or ductal dilatation. ADRENAL GLANDS: Normal size and morphology bilaterally. No nodule, thickening, or hemorrhage. No periadrenal stranding.  KIDNEYS, URETERS AND BLADDER: Small bilateral renal calculi measuring up to 2 to 3 mm noted. Similar appearance of chronic bilateral pelvocaliectasis. No stones identified along the course of either ureter. No perinephric or periureteral stranding. The bladder appears normal. GI AND BOWEL: The stomach is within normal limits. Assessment of bowel pathology is diminished due to lack of intravenous and oral contrast material. 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 There is no pathologic dilatation of the bowel loops proximal to the anastomosis. No bowel wall thickening or inflammation. Gaseous distention of the bowel at the level of the anastomosis is noted. There is no bowel obstruction. PERITONEUM AND RETROPERITONEUM: There is no free fluid or fluid collections within the  abdomen or pelvis. No signs of pneumoperitoneum. VASCULATURE: Aorta is normal in caliber. Aortic atherosclerotic calcification. LYMPH NODES: No abdominal or pelvic adenopathy. REPRODUCTIVE ORGANS: Status post hysterectomy. No adnexal mass. BONES AND SOFT TISSUES: No acute osseous abnormality. No focal soft tissue abnormality. IMPRESSION: 1. No acute findings in the abdomen or pelvis to explain the pain. 2. No evidence of bowel obstruction or inflammatory bowel process; gaseous distention at the sigmoid anastomosis may reflect functional ileus versus anastomotic narrowing. Electronically signed by: Waddell Calk MD 12/12/2024 03:35 PM EST RP Workstation: [id number]   DG Hip Unilat W or Wo Pelvis 2-3 Views Left Result Date: 12/12/2024 CLINICAL DATA:  Fall EXAM: DG HIP (WITH OR WITHOUT PELVIS) 2-3V LEFT COMPARISON:  Apr 15, 2021 FINDINGS: There is no evidence of hip fracture or dislocation. Moderate narrowing and osteophyte formation of left hip is noted. 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 No acute abnormality seen. Electronically Signed   By: Lynwood Landy Raddle M.D.    On: 12/12/2024 15:05   DG Chest Portable 1 View Result Date: 12/12/2024 CLINICAL DATA:  Fall after syncope EXAM: PORTABLE CHEST 1 VIEW COMPARISON:  August 27, 2024 FINDINGS: The heart size and mediastinal contours are within normal limits. Both lungs are clear. The visualized skeletal structures are unremarkable. IMPRESSION: No active disease. Electronically Signed   By: Lynwood Landy Raddle M.D.   On: 12/12/2024 15:04   CT HEAD WO CONTRAST Result Date: 12/12/2024 EXAM: CT HEAD AND CERVICAL SPINE 12/12/2024 01:37:39 PM TECHNIQUE: CT of the head and cervical spine was performed without the administration of intravenous contrast. Multiplanar reformatted images are provided for review. Automated exposure control, iterative reconstruction, and/or weight based adjustment of the mA/kV was utilized to reduce the radiation dose to as low as reasonably achievable. COMPARISON: None available. CLINICAL HISTORY: Polytrauma, blunt The patient sustained polytrauma due to blunt force. FINDINGS: CT HEAD BRAIN AND VENTRICLES: No acute intracranial hemorrhage. No mass effect or midline shift. No abnormal extra-axial fluid collection. No evidence of acute infarct. No hydrocephalus. ORBITS: No acute abnormality. Right glaucoma shunt. SINUSES AND MASTOIDS: No acute abnormality. SOFT TISSUES AND SKULL: No acute skull fracture. No acute soft tissue abnormality. CT CERVICAL SPINE BONES AND ALIGNMENT: No acute fracture or traumatic malalignment. DEGENERATIVE CHANGES: Moderate to severe degenerative change including degenerative disc disease greatest at C5-C6 and C6-C7, and multilevel right greater than left facet uncovertebral hypertrophy. Resulting varying degrees of neural foraminal stenosis, right greater than left. SOFT TISSUES: No prevertebral soft tissue swelling. IMPRESSION: 1. No acute intracranial abnormality. 2. No acute fracture or traumatic malalignment of the cervical spine. Electronically signed by: Glendia Molt MD  12/12/2024 02:08 PM EST RP Workstation: [id number]   CT CERVICAL SPINE WO CONTRAST Result Date: 12/12/2024 EXAM: CT HEAD AND CERVICAL SPINE 12/12/2024 01:37:39 PM TECHNIQUE: CT of the head and cervical spine was performed without the administration of intravenous contrast. Multiplanar reformatted images are provided for review. Automated exposure control, iterative reconstruction, and/or weight based adjustment of the mA/kV was utilized to reduce the radiation dose to as low as reasonably achievable. COMPARISON: None available. CLINICAL HISTORY: Polytrauma, blunt The patient sustained polytrauma due to blunt force. FINDINGS: CT HEAD BRAIN AND VENTRICLES: No acute intracranial hemorrhage. No mass effect or midline shift. No abnormal extra-axial fluid collection. No evidence of acute infarct. No hydrocephalus. ORBITS: No acute abnormality. Right glaucoma shunt. SINUSES AND MASTOIDS: No acute abnormality. SOFT TISSUES AND SKULL: No acute skull fracture. No acute soft tissue abnormality. CT CERVICAL  SPINE BONES AND ALIGNMENT: No acute fracture or traumatic malalignment. DEGENERATIVE CHANGES: Moderate to severe degenerative change including degenerative disc disease greatest at C5-C6 and C6-C7, and multilevel right greater than left facet uncovertebral hypertrophy. Resulting varying degrees of neural foraminal stenosis, right greater than left. SOFT TISSUES: No prevertebral soft tissue swelling. IMPRESSION: 1. No acute intracranial abnormality. 2. No acute fracture or traumatic malalignment of the cervical spine. Electronically signed by: Glendia Molt MD 12/12/2024 02:08 PM EST RP Workstation: [id number]    Microbiology: Results for orders placed or performed during the hospital encounter of 12/12/24  Resp panel by RT-PCR (RSV, Flu A&B, Covid) Anterior Nasal Swab     Status: None   Collection Time: 12/12/24  2:43 PM   Specimen: Anterior Nasal Swab  Result Value Ref Range Status   SARS Coronavirus 2 by RT PCR  NEGATIVE NEGATIVE Final   Influenza A by PCR NEGATIVE NEGATIVE Final   Influenza B by PCR NEGATIVE NEGATIVE Final    Comment: (NOTE) The Xpert Xpress SARS-CoV-2/FLU/RSV plus assay is intended as an aid in the diagnosis of influenza from Nasopharyngeal swab specimens and should not be used as a sole basis for treatment. Nasal washings and aspirates are unacceptable for Xpert Xpress SARS-CoV-2/FLU/RSV testing.  Fact Sheet for Patients: bloggercourse.com  Fact Sheet for Healthcare Providers: seriousbroker.it  This test is not yet approved or cleared by the [place]  FDA and has been authorized for detection and/or diagnosis of SARS-CoV-2 by FDA under an Emergency Use Authorization (EUA). This EUA will remain in effect (meaning this test can be used) for the duration of the COVID-19 declaration under Section 564(b)(1) of the Act, 21 U.S.C. section 360bbb-3(b)(1), unless the authorization is terminated or revoked.     Resp Syncytial Virus by PCR NEGATIVE NEGATIVE Final    Comment: (NOTE) Fact Sheet for Patients: bloggercourse.com  Fact Sheet for Healthcare Providers: seriousbroker.it  This test is not yet approved or cleared by the [place]  FDA and has been authorized for detection and/or diagnosis of SARS-CoV-2 by FDA under an Emergency Use Authorization (EUA). This EUA will remain in effect (meaning this test can be used) for the duration of the COVID-19 declaration under Section 564(b)(1) of the Act, 21 U.S.C. section 360bbb-3(b)(1), unless the authorization is terminated or revoked.  Performed at [facility], [address], [place], [place] [address]     Labs: CBC: Recent Labs  Lab 12/12/24 1347 12/12/24 1821 12/13/24 0233  WBC 16.2*  --  13.6*  NEUTROABS 13.5*  --   --   HGB 14.4 13.6 10.4*  HCT 41.4 40.0 29.4*  MCV 102.2*  --  100.7*  PLT 350   --  301   Basic Metabolic Panel: Recent Labs  Lab 12/12/24 1347 12/12/24 1821 12/13/24 0233 12/14/24 0318  NA 129* 135 132* 134*  K 4.3 3.8 4.1 3.9  CL 98  --  106 108  CO2 13*  --  15* 17*  GLUCOSE 113*  --  104* 105*  BUN 62*  --  44* 22  CREATININE 2.22*  --  1.45* 0.90  CALCIUM  10.3  --  9.0 9.1  MG  --   --  1.7  --    Liver Function Tests: Recent Labs  Lab 12/12/24 1347  AST 18  ALT 19  ALKPHOS 102  BILITOT 0.4  PROT 7.9  ALBUMIN 4.3   CBG: Recent Labs  Lab 12/12/24 1342  GLUCAP 129*    Discharge time spent: 40  minutes.  Signed: Deliliah Room, MD [facility] 12/14/2024 "

## 2024-12-17 ENCOUNTER — Telehealth: Payer: Self-pay

## 2024-12-17 NOTE — Transitions of Care (Post Inpatient/ED Visit) (Unsigned)
" ° °  12/17/2024  Name: ZORAH BACKES MRN: [id number] DOB: 1941-04-26  Today's TOC FU Call Status: Today's TOC FU Call Status:: Unsuccessful Call (1st Attempt) Unsuccessful Call (1st Attempt) Date: 12/17/24  Attempted to reach the patient regarding the most recent Inpatient/ED visit.  Follow Up Plan: Additional outreach attempts will be made to reach the patient to complete the Transitions of Care (Post Inpatient/ED visit) call.   Signature  Charmaine Bloodgood, LPN [facility] Health Advisor [place] [facility] You Are. We Are. [facility] Direct Dial [phone]  "

## 2024-12-18 NOTE — Transitions of Care (Post Inpatient/ED Visit) (Unsigned)
" ° °  12/18/2024  Name: Danielle Gaines MRN: [id number] DOB: May 27, 1941  Today's TOC FU Call Status: Today's TOC FU Call Status:: Unsuccessful Call (2nd Attempt) Unsuccessful Call (1st Attempt) Date: 12/17/24 Unsuccessful Call (2nd Attempt) Date: 12/18/24  Attempted to reach the patient regarding the most recent Inpatient/ED visit.  Follow Up Plan: Additional outreach attempts will be made to reach the patient to complete the Transitions of Care (Post Inpatient/ED visit) call.   Signature Julian Lemmings, LPN [facility] Nurse Health Advisor Direct Dial [phone]  "

## 2024-12-19 ENCOUNTER — Telehealth: Payer: Self-pay

## 2024-12-19 ENCOUNTER — Telehealth: Payer: Self-pay | Admitting: Family Medicine

## 2024-12-19 NOTE — Telephone Encounter (Signed)
 Copied from CRM #[id number]. Topic: General - Other >> Dec 19, 2024 11:35 AM Joesph NOVAK wrote: Reason for CRM: [facility] is calling to advise Dr.michael patient is requesting physical therapy starting 12/21/24.  Ph: [phone]

## 2024-12-19 NOTE — Telephone Encounter (Signed)
 Copied from CRM #[id number]. Topic: Clinical - Prescription Issue >> Dec 19, 2024  3:48 PM Pinkey ORN wrote: Reason for CRM: Prescription Issue >> Dec 19, 2024  3:50 PM Pinkey ORN wrote: Christopher husband called on behalf of patient's promethazine  (PHENERGAN ) 12.5 MG tablet states he's been trying to get a hold of the pharmacy to get this medication refilled, but isn't having any luck. Looking at patient's chart, it shows that it's been reordered but it doesn't state that the pharmacy has received it.

## 2024-12-19 NOTE — Transitions of Care (Post Inpatient/ED Visit) (Signed)
 "  12/19/2024  Name: Danielle Gaines MRN: [id number] DOB: Aug 24, 1941  Today's TOC FU Call Status: Today's TOC FU Call Status:: Successful TOC FU Call Completed Unsuccessful Call (1st Attempt) Date: 12/17/24 Unsuccessful Call (2nd Attempt) Date: 12/18/24 [facility] FU Call Complete Date: 12/19/24  Patient's Name and Date of Birth confirmed. Name, DOB  Transition Care Management Follow-up Telephone Call Date of Discharge: 12/14/24 Discharge Facility: [facility]) Type of Discharge: Inpatient Admission Primary Inpatient Discharge Diagnosis:: migraine How have you been since you were released from the hospital?: Same Any questions or concerns?: No  Items Reviewed: Did you receive and understand the discharge instructions provided?: Yes Medications obtained,verified, and reconciled?: Yes (Medications Reviewed) Any new allergies since your discharge?: No Dietary orders reviewed?: Yes Do you have support at home?: Yes People in Home [RPT]: spouse  Medications Reviewed Today: Medications Reviewed Today     Reviewed by Emmitt Pan, LPN (Licensed Practical Nurse) on 12/19/24 at 1103  Med List Status: <None>   Medication Order Taking? Sig Documenting Provider Last Dose Status Informant  acetaminophen  (TYLENOL ) 500 MG tablet [id number] Yes Take 1,000 mg by mouth daily as needed for headache. [provider]  Active Self  Atogepant (QULIPTA) 60 MG TABS [id number] Yes Take 60 mg by mouth daily. [provider]  Active Self  atorvastatin  (LIPITOR) 40 MG tablet [id number] Yes Take 1 tablet (40 mg total) by mouth daily. Lonni Slain, MD  Active Self  baclofen (LIORESAL) 10 MG tablet [id number] Yes Take 10 mg by mouth at bedtime as needed for muscle spasms. [provider]  Active Self  buPROPion  (WELLBUTRIN  XL) 150 MG 24 hr tablet [id number] Yes Take 300 mg by mouth every morning. [provider]  Active Self  citalopram  (CELEXA ) 40 MG tablet [id number]  Yes Take 40 mg by mouth daily. [provider]  Active Self  CREON  36000-114000 units CPEP capsule [id number] Yes Take 36,000 Units by mouth 3 (three) times daily. [provider]  Active Self  cycloSPORINE  (RESTASIS ) 0.05 % ophthalmic emulsion [id number] Yes Place 1 drop into both eyes 2 (two) times daily. [provider]  Active Self  denosumab  (PROLIA ) injection 60 mg [id number]   Trixie File, MD  Active   diclofenac (VOLTAREN) 75 MG EC tablet [id number] Yes Take 75 mg by mouth 2 (two) times daily as needed for moderate pain (pain score 4-6). [provider]  Active Self  Dorzolamide  HCl-Timolol  Mal PF 2-0.5 % SOLN [id number] Yes INSTILL 1 DROP IN BOTH EYES TWICE DAILY Ozell Heron HERO, MD  Active Self  famotidine  (PEPCID ) 40 MG tablet [id number] Yes Take 40 mg by mouth daily. [provider]  Active Self  folic acid  (FOLVITE ) 1 MG tablet [id number] Yes Take by mouth. [provider]  Active Self  gabapentin (NEURONTIN) 100 MG capsule [id number] Yes Take 100 mg by mouth. [provider]  Active Self  LINZESS  290 MCG CAPS capsule [id number] Yes Take 290 mcg by mouth daily. [provider]  Active Self  LORazepam  (ATIVAN ) 0.5 MG tablet [id number] Yes Take 0.5 mg by mouth in the morning, at noon, and at bedtime. 1 in am 2 at hs [provider]  Active Self  meclizine  (ANTIVERT ) 25 MG tablet [id number] Yes Take 1 tablet (25 mg total) by mouth 3 (three) times daily as needed for dizziness. Ozell Heron HERO, MD  Active Self  meloxicam  (MOBIC ) 15 MG tablet [id number] Yes Take 1 tablet (15 mg total) by mouth daily. Ozell Heron  M, MD  Active Self  olopatadine  (PATANOL) 0.1 % ophthalmic solution [id number] Yes Place 1 drop into both eyes daily as needed for allergies. [provider]  Active Self           Med Note (SATTERFIELD, TEENA BRAVO   Thu Dec 13, 2024  8:45 PM) Patient verified she takes as needed   omeprazole  (PRILOSEC) 40 MG capsule [id number] Yes Take 40 mg by mouth 2 (two) times daily. [provider]  Active Self  Probiotic Product (ALIGN PO) [id number] Yes Take 1 tablet by mouth daily. [provider]  Active Self  promethazine  (PHENERGAN ) 12.5 MG tablet [id number] Yes Take 12.5 mg by mouth 3 (three) times daily as needed for vomiting. [provider]  Active Self  sodium bicarbonate  650 MG tablet [id number] Yes Take 650 mg by mouth 2 (two) times daily. [provider]  Active Self  tamsulosin  (FLOMAX ) 0.4 MG CAPS capsule [id number] Yes Take 0.4 mg by mouth daily. [provider]  Active Self  Travoprost, BAK Free, (TRAVATAN) 0.004 % SOLN ophthalmic solution [id number] Yes Place 1 drop into the left eye 2 (two) times daily. [provider]  Active Self  traZODone  (DESYREL ) 50 MG tablet [id number] Yes Take 100 mg by mouth at bedtime as needed for sleep. [provider]  Active Self  Ubrogepant  (UBRELVY ) 100 MG TABS [id number] Yes Take 1 tablet (100 mg total) by mouth daily as needed (for migraine). Dino Antu, MD  Active   Vitamin D , Ergocalciferol , (DRISDOL ) 1.25 MG (50000 UNIT) CAPS capsule [id number] Yes Take 50,000 Units by mouth once a week. [provider]  Active Self           Med Note (SATTERFIELD, TEENA BRAVO Schaumann Dec 13, 2024  8:43 PM) Take on Saturdays             Home Care and Equipment/Supplies: Were Home Health Services Ordered?: NA Any new equipment or medical supplies ordered?: NA  Functional Questionnaire: Do you need assistance with bathing/showering or dressing?: No Do you need assistance with meal preparation?: No Do you need assistance with eating?: No Do you have difficulty maintaining continence: No Do you need assistance with getting out of bed/getting out of a chair/moving?: No Do you have difficulty managing or taking your medications?: No  Follow up appointments reviewed: PCP Follow-up  appointment confirmed?: No (declined in person and video) MD Provider Line Number:[phone] Given: No Specialist Hospital Follow-up appointment confirmed?: NA Do you need transportation to your follow-up appointment?: No Do you understand care options if your condition(s) worsen?: Yes-patient verbalized understanding    SIGNATURE Julian Lemmings, LPN [facility] Nurse Health Advisor Direct Dial [phone]  "

## 2024-12-20 ENCOUNTER — Other Ambulatory Visit: Payer: Self-pay | Admitting: Family Medicine

## 2024-12-20 NOTE — Telephone Encounter (Signed)
 Spoke with Mr Mcconathy and he stated the patient does not have any Promethazine  at this time and requested the Rx be sent to [facility] on [address]  Mr Coaxum stated the patient is still sick, very weak and needs to return to the hospital but will not return due to nothing but bad experiences with [facility].   Message sent to PCP.

## 2024-12-20 NOTE — Telephone Encounter (Signed)
 If the patient is still very sick and cannot get out of bed on her own then she needs to be re-evaluated. Ambulance services can be used and they can come out to decide if she needs to go back to the hospital. Please have patient call an ambulance if he cannot get her to the ED himself.

## 2024-12-20 NOTE — Telephone Encounter (Signed)
 Spoke with Danielle Gaines at [facility] and she stated Danielle Gaines will be informed of the message below.  I called the patient, spoke with Danielle Gaines and offered to schedule an appt.  He stated the patient was sick prior to taking her to the hospital, is still sick, in bed and now and cannot do any PT at home at this time.  Message sent to PCP.

## 2024-12-20 NOTE — Telephone Encounter (Signed)
 Patient needs a face to face encounter due to insurance-- medicare requires a visit in order to place the orders for home health

## 2024-12-20 NOTE — Telephone Encounter (Signed)
 Left a message for Danielle Gaines to return my call.

## 2024-12-20 NOTE — Telephone Encounter (Signed)
 Looks like they filled 30 tablets of promethazine  for her on 12/04/24. I am not sure if I am filling this for her or if it is a different physician.SABRASABRA

## 2024-12-20 NOTE — Telephone Encounter (Signed)
 Spoke with Mr Hehr and informed him of the message below.  He was also advised per PCP if the EMS determines she needs to go to back to the hospital, she can request to be transported to [facility] or [facility] instead of [facility]  if preferred.

## 2024-12-21 NOTE — Progress Notes (Incomplete)
 "  NEUROLOGY CONSULTATION NOTE  Danielle Gaines MRN: [id number] DOB: 1941/09/24  Referring provider: Heron Sharper, MD Primary care provider: Heron Sharper, MD  Reason for consult:  migraine  Assessment/Plan:   ***   Subjective:  Danielle Gaines is an 84 year old ***-handed female with CAD, CKD, arthritis, chronic pain, macular degeneration, IBS, depression and anxiety who presents for migraines.  History supplemented by referring provider's note.  CT personally reviewed.  Onset:  *** Location:  *** Quality:  *** Intensity:  ***.  Aura:  *** Prodrome:  *** Postdrome:  *** Associated symptoms:  ***.  *** denies associated unilateral numbness or weakness. Duration:  *** Frequency:  *** Frequency of abortive medication: *** Triggers:  *** Relieving factors:  *** Activity:  ***  12/12/2024 CT HEAD:  No acute intracranial abnormality.   Past NSAIDS/analgesics:  tramadol , Toradol   Past abortive triptans:  sumatriptan  100mg  Past abortive ergotamine:  *** Past muscle relaxants:  Skelaxin , Robaxin  Past anti-emetic:  Zofran  Past antihypertensive medications:  metoprolol , amlodipine  Past antidepressant/antipsychotic medications:  amlodipine , desvenlafaxine, duloxetine , Trintellix, fluoxetine  Past anticonvulsant medications:  topiramate , Trokendi  XR Past anti-CGRP:  Emgality Past vitamins/Herbal/Supplements:  *** Past antihistamines/decongestants:  Benadryl  Other past therapies:  ***  Current NSAIDS/analgesics:  meloxicam  15mg  daily, Tylenol , diclofenac 75mg  PRN Current triptans:  none Current ergotamine:  none Current anti-emetic:  promethazine  12.5mg  Current muscle relaxants:  baclofen 10mg  at bedtime PRN Current Antihypertensive medications:  none Current Antidepressant/antipsychotic medications:  Wellbutrin  XL 300mg  daily, Celexa  40mg  daily, trazodone  100mg  at bedtime PRN Current Anticonvulsant medications:  gabapentin 100mg  daily Current anti-CGRP:  Qulipta 60mg   daily, Ubrelvy  100mg  *** Current Vitamins/Herbal/Supplements:  *** Current Antihistamines/Decongestants:  meclizine  25mg  PRN Other therapy:  *** Other medications:  ***   Caffeine :  *** Alcohol:  *** Smoker:  *** Diet:  *** Exercise:  *** Depression:  ***; Anxiety:  *** Other pain:  *** Sleep hygiene:  ***  History of TBI/concussion:  *** Family history of headache:  *** Family history of cerebral aneurysm:  ***      PAST MEDICAL HISTORY: Past Medical History:  Diagnosis Date   Anemia    Anxiety    Arthritis    Benzodiazepine dependence (HCC)    Carotid arterial disease    mild   Chronic kidney disease (CKD), stage III (moderate) (HCC) 08/01/2018   -seeing  Nephrology, Dr. 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 Vincente   Diastolic heart failure (HCC)    mild on echo 2016 no cardiologist   GERD (gastroesophageal reflux disease)    IBS (irritable bowel syndrome)    sees Dr. Luis   Macular degeneration    goes to [facility] for this   Migraine    managed by neurologist   Osteoporosis    Sty    right eye   Uvulitis    Vertigo    seeing  dr bates for [facility] 08-11-2021 epic   Wears glasses    for reading   Wears hearing aid in both ears     PAST SURGICAL HISTORY: Past Surgical History:  Procedure Laterality Date   ABDOMINAL ADHESION SURGERY     ABDOMINAL HYSTERECTOMY  1972   APPENDECTOMY     CHOLECYSTECTOMY  1996   colonscopy  1989  CYSTOSCOPY W/ URETERAL STENT PLACEMENT Right 04/27/2016   Procedure: CYSTOSCOPY WITH RETROGRADE PYELOGRAM/ RIGHT URETERAL STENT PLACEMENT;  Surgeon: Morene LELON Salines, MD;  Location: [facility];  Service: Urology;  Laterality: Right;   CYSTOSCOPY WITH RETROGRADE PYELOGRAM, URETEROSCOPY AND STENT PLACEMENT Left 10/09/2021   Procedure: CYSTOSCOPY WITH LEFT  RETROGRADE URETEROSCOPY WITH HOLMIUM LASER AND STENT PLACEMENT;  Surgeon: Watt Rush, MD;  Location: [facility];  Service: Urology;  Laterality: Left;   ESOPHAGOGASTRODUODENOSCOPY     2002, 2011, 2014, 2021   EXTRACORPOREAL SHOCK WAVE LITHOTRIPSY Left 08/31/2021   Procedure: LEFT EXTRACORPOREAL SHOCK WAVE LITHOTRIPSY (ESWL);  Surgeon: Salines Morene LELON, MD;  Location: [facility];  Service: Urology;  Laterality: Left;   FLEXIBLE SIGMOIDOSCOPY     1991, 1994, 2006, 2011, 2021   HOLMIUM LASER APPLICATION Left 10/09/2021   Procedure: HOLMIUM LASER APPLICATION;  Surgeon: Watt Rush, MD;  Location: [facility];  Service: Urology;  Laterality: Left;   lasix  eye surgery     ou   MEMBRANECTOMY Left 09/21/2013   OVARIAN CYST SURGERY     PARS PLANA VITRECTOMY Left 09/21/2013   SMALL INTESTINE SURGERY     SUBTOTAL COLECTOMY     1991, 1994, 2006, 2011   tumor removal right shoulder  06/22/2021    MEDICATIONS: Medications Ordered Prior to Encounter[1]  ALLERGIES: Allergies[2]  FAMILY HISTORY: Family History  Problem Relation Age of Onset   Kidney disease Mother    Heart disease Mother    Osteoporosis Mother    Heart Problems Sister        pacemaker   Cancer Maternal Aunt        intestinal and liver cancer   Stroke Maternal Grandmother    Heart disease Maternal Grandmother    Stroke Other    Depression Other        everyone   Hypercalcemia Neg Hx    Breast cancer Neg Hx     Objective:  *** General: No acute distress.  Patient appears well-groomed.   Head:  Normocephalic/atraumatic Eyes:  fundi examined but not visualized Neck: supple, no paraspinal tenderness, full range of motion Heart: regular rate and rhythm Neurological Exam: Mental status: alert and oriented to person, place, and time, speech fluent and not dysarthric, language intact. Cranial nerves: CN I: not tested CN II: pupils equal, round and reactive to light,  visual fields intact CN III, IV, VI:  full range of motion, no nystagmus, no ptosis CN V: facial sensation intact. CN VII: upper and lower face symmetric CN VIII: hearing intact CN IX, X: gag intact, uvula midline CN XI: sternocleidomastoid and trapezius muscles intact CN XII: tongue midline Bulk & Tone: normal, no fasciculations. Motor:  muscle strength 5/5 throughout Sensation:  Pinprick and vibratory sensation intact. Deep Tendon Reflexes:  2+ throughout,  toes downgoing.   Finger to nose testing:  Without dysmetria.   Gait:  Normal station and stride.  Romberg negative.    Juliene Dunnings, DO  CC: ***        [1]  Current Outpatient Medications on File Prior to Visit  Medication Sig Dispense Refill   acetaminophen  (TYLENOL ) 500 MG tablet Take 1,000 mg by mouth daily as needed for headache.     Atogepant (QULIPTA) 60 MG TABS Take 60 mg by mouth daily.     atorvastatin  (LIPITOR) 40 MG tablet Take 1 tablet (40 mg total) by mouth daily. 90 tablet 3   baclofen (LIORESAL) 10 MG tablet Take  10 mg by mouth at bedtime as needed for muscle spasms.     buPROPion  (WELLBUTRIN  XL) 150 MG 24 hr tablet Take 300 mg by mouth every morning.     citalopram  (CELEXA ) 40 MG tablet Take 40 mg by mouth daily.     CREON  36000-114000 units CPEP capsule Take 36,000 Units by mouth 3 (three) times daily.     cycloSPORINE  (RESTASIS ) 0.05 % ophthalmic emulsion Place 1 drop into both eyes 2 (two) times daily.     diclofenac (VOLTAREN) 75 MG EC tablet Take 75 mg by mouth 2 (two) times daily as needed for moderate pain (pain score 4-6).     Dorzolamide  HCl-Timolol  Mal PF 2-0.5 % SOLN INSTILL 1 DROP IN BOTH EYES TWICE DAILY 60 each 0   famotidine  (PEPCID ) 40 MG tablet Take 40 mg by mouth daily.     folic acid  (FOLVITE ) 1 MG tablet Take by mouth.     gabapentin (NEURONTIN) 100 MG capsule Take 100 mg by mouth.     LINZESS  290 MCG CAPS capsule Take 290 mcg by mouth daily.     LORazepam  (ATIVAN ) 0.5 MG tablet Take  0.5 mg by mouth in the morning, at noon, and at bedtime. 1 in am 2 at hs     meclizine  (ANTIVERT ) 25 MG tablet Take 1 tablet (25 mg total) by mouth 3 (three) times daily as needed for dizziness. 30 tablet 3   meloxicam  (MOBIC ) 15 MG tablet Take 1 tablet (15 mg total) by mouth daily. 30 tablet 5   olopatadine  (PATANOL) 0.1 % ophthalmic solution Place 1 drop into both eyes daily as needed for allergies.     omeprazole (PRILOSEC) 40 MG capsule Take 40 mg by mouth 2 (two) times daily.     Probiotic Product (ALIGN PO) Take 1 tablet by mouth daily.     promethazine  (PHENERGAN ) 12.5 MG tablet Take 12.5 mg by mouth 3 (three) times daily as needed for vomiting.     sodium bicarbonate  650 MG tablet Take 650 mg by mouth 2 (two) times daily.     tamsulosin  (FLOMAX ) 0.4 MG CAPS capsule Take 0.4 mg by mouth daily.     Travoprost, BAK Free, (TRAVATAN) 0.004 % SOLN ophthalmic solution Place 1 drop into the left eye 2 (two) times daily.     traZODone  (DESYREL ) 50 MG tablet Take 100 mg by mouth at bedtime as needed for sleep.     Ubrogepant  (UBRELVY ) 100 MG TABS Take 1 tablet (100 mg total) by mouth daily as needed (for migraine).     Vitamin D , Ergocalciferol , (DRISDOL ) 1.25 MG (50000 UNIT) CAPS capsule Take 50,000 Units by mouth once a week.     Current Facility-Administered Medications on File Prior to Visit  Medication Dose Route Frequency Provider Last Rate Last Admin   denosumab  (PROLIA ) injection 60 mg  60 mg Subcutaneous Once Gherghe, Cristina, MD      [2]  Allergies Allergen Reactions   Elemental Sulfur Other (See Comments)   Sulfa Antibiotics Swelling and Other (See Comments)    Reaction:  Unspecified swelling reaction    "

## 2024-12-24 ENCOUNTER — Ambulatory Visit: Admitting: Neurology

## 2024-12-25 NOTE — Telephone Encounter (Signed)
 Noted.

## 2024-12-25 NOTE — Telephone Encounter (Signed)
 I am following up on this task-- looks like she has an appt with me on Thursday, I think it is ok to close this task for now since an appointment has been made

## 2024-12-27 ENCOUNTER — Ambulatory Visit: Admitting: Family Medicine

## 2025-01-08 ENCOUNTER — Ambulatory Visit: Admitting: Family Medicine
